# Patient Record
Sex: Male | Born: 1935
Health system: Southern US, Community
[De-identification: ages and names within clinical notes are randomized; demographics above are authoritative.]

## PROBLEM LIST (undated history)

## (undated) DIAGNOSIS — G20A1 Parkinson's disease without dyskinesia, without mention of fluctuations: Secondary | ICD-10-CM

## (undated) DIAGNOSIS — C4490 Unspecified malignant neoplasm of skin, unspecified: Secondary | ICD-10-CM

## (undated) DIAGNOSIS — M7989 Other specified soft tissue disorders: Secondary | ICD-10-CM

## (undated) DIAGNOSIS — G459 Transient cerebral ischemic attack, unspecified: Secondary | ICD-10-CM

## (undated) DIAGNOSIS — I714 Abdominal aortic aneurysm, without rupture, unspecified: Secondary | ICD-10-CM

## (undated) DIAGNOSIS — L570 Actinic keratosis: Secondary | ICD-10-CM

## (undated) DIAGNOSIS — I5030 Unspecified diastolic (congestive) heart failure: Secondary | ICD-10-CM

## (undated) DIAGNOSIS — I509 Heart failure, unspecified: Secondary | ICD-10-CM

## (undated) DIAGNOSIS — H3320 Serous retinal detachment, unspecified eye: Secondary | ICD-10-CM

## (undated) DIAGNOSIS — I2510 Atherosclerotic heart disease of native coronary artery without angina pectoris: Secondary | ICD-10-CM

## (undated) DIAGNOSIS — I739 Peripheral vascular disease, unspecified: Secondary | ICD-10-CM

## (undated) DIAGNOSIS — Z9289 Personal history of other medical treatment: Secondary | ICD-10-CM

## (undated) DIAGNOSIS — I350 Nonrheumatic aortic (valve) stenosis: Secondary | ICD-10-CM

## (undated) DIAGNOSIS — M810 Age-related osteoporosis without current pathological fracture: Secondary | ICD-10-CM

## (undated) DIAGNOSIS — K219 Gastro-esophageal reflux disease without esophagitis: Secondary | ICD-10-CM

## (undated) DIAGNOSIS — G20 Parkinson's disease: Secondary | ICD-10-CM

## (undated) DIAGNOSIS — M549 Dorsalgia, unspecified: Secondary | ICD-10-CM

## (undated) DIAGNOSIS — I10 Essential (primary) hypertension: Secondary | ICD-10-CM

## (undated) DIAGNOSIS — E119 Type 2 diabetes mellitus without complications: Secondary | ICD-10-CM

## (undated) DIAGNOSIS — I4819 Other persistent atrial fibrillation: Secondary | ICD-10-CM

## (undated) DIAGNOSIS — M889 Osteitis deformans of unspecified bone: Secondary | ICD-10-CM

## (undated) DIAGNOSIS — E782 Mixed hyperlipidemia: Secondary | ICD-10-CM

## (undated) HISTORY — DX: Abdominal aortic aneurysm, without rupture, unspecified: I71.40

## (undated) HISTORY — DX: Transient cerebral ischemic attack, unspecified: G45.9

## (undated) HISTORY — PX: TONSILLECTOMY: SUR1361

## (undated) HISTORY — PX: GALLBLADDER SURGERY: SHX652

## (undated) HISTORY — PX: COLONOSCOPY: SHX174

## (undated) HISTORY — DX: Dorsalgia, unspecified: M54.9

## (undated) HISTORY — PX: HERNIA REPAIR: SHX51

## (undated) HISTORY — DX: Peripheral vascular disease, unspecified: I73.9

## (undated) HISTORY — DX: Personal history of other medical treatment: Z92.89

## (undated) HISTORY — PX: APPENDECTOMY: SHX54

## (undated) HISTORY — DX: Heart failure, unspecified: I50.9

## (undated) HISTORY — PX: RETINAL DETACHMENT SURGERY: SHX105

## (undated) HISTORY — DX: Parkinson's disease: G20

## (undated) HISTORY — DX: Type 2 diabetes mellitus without complications: E11.9

## (undated) HISTORY — DX: Abdominal aortic aneurysm, without rupture: I71.4

## (undated) HISTORY — DX: Nonrheumatic aortic (valve) stenosis: I35.0

## (undated) HISTORY — PX: CATARACT EXTRACTION: SUR2

## (undated) HISTORY — DX: Unspecified diastolic (congestive) heart failure: I50.30

## (undated) HISTORY — DX: Osteitis deformans of unspecified bone: M88.9

## (undated) HISTORY — DX: Actinic keratosis: L57.0

## (undated) HISTORY — DX: Other specified soft tissue disorders: M79.89

## (undated) HISTORY — DX: Age-related osteoporosis without current pathological fracture: M81.0

## (undated) HISTORY — DX: Parkinson's disease without dyskinesia, without mention of fluctuations: G20.A1

## (undated) HISTORY — PX: ESOPHAGEAL DILATION: SHX303

## (undated) HISTORY — DX: Essential (primary) hypertension: I10

## (undated) HISTORY — DX: Mixed hyperlipidemia: E78.2

## (undated) HISTORY — DX: Serous retinal detachment, unspecified eye: H33.20

## (undated) HISTORY — DX: Unspecified malignant neoplasm of skin, unspecified: C44.90

## (undated) HISTORY — PX: OTHER SURGICAL HISTORY: SHX169

## (undated) HISTORY — DX: Gastro-esophageal reflux disease without esophagitis: K21.9

## (undated) HISTORY — PX: BACK SURGERY: SHX140

## (undated) HISTORY — DX: Other persistent atrial fibrillation: I48.19

---

## 2004-09-16 ENCOUNTER — Ambulatory Visit: Payer: Self-pay | Admitting: Unknown Physician Specialty

## 2005-04-21 ENCOUNTER — Inpatient Hospital Stay: Payer: Self-pay

## 2005-04-21 ENCOUNTER — Other Ambulatory Visit: Payer: Self-pay

## 2005-08-21 ENCOUNTER — Ambulatory Visit: Payer: Self-pay | Admitting: General Surgery

## 2005-08-28 ENCOUNTER — Ambulatory Visit: Payer: Self-pay | Admitting: General Surgery

## 2006-01-13 ENCOUNTER — Ambulatory Visit: Payer: Self-pay | Admitting: General Surgery

## 2006-07-15 ENCOUNTER — Ambulatory Visit: Payer: Self-pay | Admitting: General Surgery

## 2007-02-02 ENCOUNTER — Ambulatory Visit: Payer: Self-pay | Admitting: General Surgery

## 2007-04-19 ENCOUNTER — Other Ambulatory Visit: Payer: Self-pay

## 2007-04-19 ENCOUNTER — Emergency Department: Payer: Self-pay | Admitting: Emergency Medicine

## 2007-08-27 ENCOUNTER — Ambulatory Visit: Payer: Self-pay | Admitting: General Surgery

## 2008-03-14 ENCOUNTER — Ambulatory Visit: Payer: Self-pay | Admitting: General Surgery

## 2008-05-08 ENCOUNTER — Ambulatory Visit: Payer: Self-pay | Admitting: Unknown Physician Specialty

## 2008-09-12 ENCOUNTER — Ambulatory Visit: Payer: Self-pay | Admitting: General Surgery

## 2009-03-15 ENCOUNTER — Ambulatory Visit: Payer: Self-pay | Admitting: General Surgery

## 2009-09-12 ENCOUNTER — Ambulatory Visit: Payer: Self-pay | Admitting: General Surgery

## 2010-03-13 ENCOUNTER — Ambulatory Visit: Payer: Self-pay | Admitting: General Surgery

## 2010-07-05 ENCOUNTER — Ambulatory Visit: Payer: Self-pay

## 2010-09-05 ENCOUNTER — Ambulatory Visit: Payer: Self-pay | Admitting: General Surgery

## 2011-09-15 ENCOUNTER — Ambulatory Visit: Payer: Self-pay | Admitting: General Surgery

## 2011-09-23 ENCOUNTER — Encounter: Payer: Self-pay | Admitting: *Deleted

## 2011-09-24 ENCOUNTER — Ambulatory Visit (INDEPENDENT_AMBULATORY_CARE_PROVIDER_SITE_OTHER): Payer: Medicare Other | Admitting: Cardiovascular Disease

## 2011-09-24 ENCOUNTER — Encounter: Payer: Self-pay | Admitting: Cardiovascular Disease

## 2011-09-24 VITALS — BP 145/79 | HR 86 | Ht 69.0 in | Wt 205.0 lb

## 2011-09-24 DIAGNOSIS — E119 Type 2 diabetes mellitus without complications: Secondary | ICD-10-CM

## 2011-09-24 DIAGNOSIS — I2089 Other forms of angina pectoris: Secondary | ICD-10-CM | POA: Insufficient documentation

## 2011-09-24 DIAGNOSIS — R079 Chest pain, unspecified: Secondary | ICD-10-CM

## 2011-09-24 DIAGNOSIS — I10 Essential (primary) hypertension: Secondary | ICD-10-CM | POA: Insufficient documentation

## 2011-09-24 DIAGNOSIS — I208 Other forms of angina pectoris: Secondary | ICD-10-CM | POA: Insufficient documentation

## 2011-09-24 DIAGNOSIS — E1169 Type 2 diabetes mellitus with other specified complication: Secondary | ICD-10-CM | POA: Insufficient documentation

## 2011-09-24 DIAGNOSIS — E785 Hyperlipidemia, unspecified: Secondary | ICD-10-CM

## 2011-09-24 NOTE — Assessment & Plan Note (Signed)
Recent chest "indigestion" symptoms are atypical in nature. No symptoms with exertion. EKG is essentially unchanged. He denies any symptoms when he is exercising. No further workup at this time. We have asked him to call the office if he has worsening symptoms or shortness of breath or chest pain with exertion.

## 2011-09-24 NOTE — Assessment & Plan Note (Signed)
Blood pressure is well controlled on today's visit. No changes made to the medications. 

## 2011-09-24 NOTE — Assessment & Plan Note (Signed)
We have encouraged continued exercise, careful diet management in an effort to lose weight. 

## 2011-09-24 NOTE — Assessment & Plan Note (Signed)
Cholesterol is high. He is now tolerating WelChol well though he does report now cutting it in half. We will restart Crestor 5 mg every other day.

## 2011-09-24 NOTE — Patient Instructions (Signed)
You are doing well. Please take crestor 1/2 pill every other day with welchol  Please call us if you have new issues that need to be addressed before your next appt.  Your physician wants you to follow-up in: 12 months.  You will receive a reminder letter in the mail two months in advance. If you don't receive a letter, please call our office to schedule the follow-up appointment.

## 2011-09-24 NOTE — Progress Notes (Signed)
Patient ID: Timothy [name], male    DOB: [birth year], 78 y.o.   MRN: [id number]  HPI Comments: [name] is a very pleasant 78 year old gentleman with a history of high cholesterol, hypertension, diabetes hemoglobin A1c 6.6 with no known coronary artery disease, patient of Dr. Vonita Moss, previously seen by myself at [facility], who presents to reestablish care in the office.  He reports that he has had 3 or 4 episodes of "indigestion" with sensations in the left upper chest that come on when he is in bed lasting for 30 minutes up to one hour. He's otherwise been active and has no complaints. He reports that he was able to walk a long distance recently in a stadium upstairs and he did not have to stop for shortness of breath. He is able to go shopping, tends to his house on a regular basis with no symptoms.   Blood pressure at home is 120-140 systolic on a regular basis. He did have trouble in the past taking WelChol and now cuts the pill in half. A full pill made him choke. In the past he was on Crestor.  EKG shows normal sinus rhythm with sinus arrhythmia, rate 75 beats per minute, poor R wave correction to the anterior precordial leads, T wave abnormality in V6  Labwork from March 2013 shows total cholesterol 207, LDL 125   Outpatient Encounter Prescriptions as of 09/24/2011  Medication Sig Dispense Refill  . aspirin 81 MG tablet Take 81 mg by mouth daily.      . benazepril (LOTENSIN) 20 MG tablet Take 20 mg by mouth daily.      . Colesevelam HCl (WELCHOL PO) Take 6 tablets once daily by mouth      . glucosamine-chondroitin 500-400 MG tablet Take 1 tablet by mouth daily.      Marland Kitchen glucose blood test strip 1 each by Other route 2 (two) times daily. Use as instructed      . glyBURIDE-metformin (GLUCOVANCE) 5-500 MG per tablet Take 1 tablet by mouth 2 (two) times daily with a meal.      . metoprolol succinate (TOPROL-XL) 50 MG 24 hr tablet Take 50 mg by mouth daily.  Take with or immediately following a meal.      . pantoprazole (PROTONIX) 40 MG tablet Take 40 mg by mouth daily.      Marland Kitchen PHENobarbital (LUMINAL) 32.4 MG tablet Take 32.4 mg by mouth at bedtime.      . pioglitazone (ACTOS) 15 MG tablet Take 15 mg by mouth daily.      Marland Kitchen triamcinolone cream (KENALOG) 0.1 % As directed       Review of Systems  Constitutional: Negative.   HENT: Negative.   Eyes: Negative.   Respiratory: Negative.   Cardiovascular: Negative.        "Indigestion" in the chest a left  Gastrointestinal: Negative.   Musculoskeletal: Negative.   Skin: Negative.   Neurological: Negative.   Hematological: Negative.   Psychiatric/Behavioral: Negative.   All other systems reviewed and are negative.    BP 145/79  Pulse 86  Ht 5\' 9"  (1.753 m)  Wt 205 lb (92.987 kg)  BMI 30.27 kg/m2  Physical Exam  Nursing note and vitals reviewed. Constitutional: He is oriented to person, place, and time. He appears well-developed and well-nourished.  HENT:  Head: Normocephalic.  Nose: Nose normal.  Mouth/Throat: Oropharynx is clear and moist.  Eyes: Conjunctivae are normal. Pupils are equal, round, and reactive to  light.  Neck: Normal range of motion. Neck supple. No JVD present.  Cardiovascular: Normal rate, regular rhythm, S1 normal, S2 normal and intact distal pulses.  Exam reveals no gallop and no friction rub.   Murmur heard.  Crescendo systolic murmur is present with a grade of 2/6  Pulmonary/Chest: Effort normal and breath sounds normal. No respiratory distress. He has no wheezes. He has no rales. He exhibits no tenderness.  Abdominal: Soft. Bowel sounds are normal. He exhibits no distension. There is no tenderness.  Musculoskeletal: Normal range of motion. He exhibits no edema and no tenderness.  Lymphadenopathy:    He has no cervical adenopathy.  Neurological: He is alert and oriented to person, place, and time. Coordination normal.  Skin: Skin is warm and dry. No rash noted.  No erythema.  Psychiatric: He has a normal mood and affect. His behavior is normal. Judgment and thought content normal.           Assessment and Plan

## 2011-11-18 ENCOUNTER — Telehealth: Payer: Self-pay

## 2011-11-18 MED ORDER — FUROSEMIDE 20 MG PO TABS: 20.0000 mg | ORAL_TABLET | Freq: Every day | ORAL | Status: DC

## 2011-11-18 NOTE — Telephone Encounter (Signed)
Per Dr. Mariah Milling call in furosemide 20 mg take one tablet daily for the patient.

## 2011-11-19 ENCOUNTER — Encounter: Payer: Self-pay | Admitting: Cardiovascular Disease

## 2011-12-24 ENCOUNTER — Observation Stay: Payer: Self-pay | Admitting: Internal Medicine

## 2011-12-24 LAB — APTT: Activated PTT: 29.7 secs (ref 23.6–35.9)

## 2011-12-24 LAB — CBC
HCT: 35.6 % — ABNORMAL LOW (ref 40.0–52.0)
HGB: 11.6 g/dL — ABNORMAL LOW (ref 13.0–18.0)
MCH: 31.6 pg (ref 26.0–34.0)
MCHC: 32.6 g/dL (ref 32.0–36.0)
MCV: 97 fL (ref 80–100)
Platelet: 192 10*3/uL (ref 150–440)
RBC: 3.67 10*6/uL — ABNORMAL LOW (ref 4.40–5.90)
RDW: 14 % (ref 11.5–14.5)
WBC: 6.4 10*3/uL (ref 3.8–10.6)

## 2011-12-24 LAB — PROTIME-INR
INR: 0.9
Prothrombin Time: 13 secs (ref 11.5–14.7)

## 2011-12-24 LAB — COMPREHENSIVE METABOLIC PANEL
Albumin: 4 g/dL (ref 3.4–5.0)
Alkaline Phosphatase: 57 U/L (ref 50–136)
Anion Gap: 12 (ref 7–16)
BUN: 22 mg/dL — ABNORMAL HIGH (ref 7–18)
Bilirubin,Total: 0.3 mg/dL (ref 0.2–1.0)
Calcium, Total: 8.3 mg/dL — ABNORMAL LOW (ref 8.5–10.1)
Chloride: 102 mmol/L (ref 98–107)
Co2: 25 mmol/L (ref 21–32)
Creatinine: 1.3 mg/dL (ref 0.60–1.30)
EGFR (African American): 60 — ABNORMAL LOW
EGFR (Non-African Amer.): 52 — ABNORMAL LOW
Glucose: 160 mg/dL — ABNORMAL HIGH (ref 65–99)
Osmolality: 284 (ref 275–301)
Potassium: 3.9 mmol/L (ref 3.5–5.1)
SGOT(AST): 32 U/L (ref 15–37)
SGPT (ALT): 26 U/L
Sodium: 139 mmol/L (ref 136–145)
Total Protein: 6.8 g/dL (ref 6.4–8.2)

## 2011-12-24 LAB — CK TOTAL AND CKMB (NOT AT ARMC)
CK, Total: 96 U/L (ref 35–232)
CK-MB: 1.1 ng/mL (ref 0.5–3.6)

## 2011-12-24 LAB — TROPONIN I: Troponin-I: <0.02 ng/mL

## 2011-12-25 DIAGNOSIS — R011 Cardiac murmur, unspecified: Secondary | ICD-10-CM

## 2011-12-25 DIAGNOSIS — G459 Transient cerebral ischemic attack, unspecified: Secondary | ICD-10-CM

## 2011-12-25 DIAGNOSIS — I359 Nonrheumatic aortic valve disorder, unspecified: Secondary | ICD-10-CM

## 2011-12-25 LAB — LIPID PANEL
Cholesterol: 109 mg/dL (ref 0–200)
HDL Cholesterol: 29 mg/dL — ABNORMAL LOW (ref 40–60)
Ldl Cholesterol, Calc: 41 mg/dL (ref 0–100)
Triglycerides: 195 mg/dL (ref 0–200)
VLDL Cholesterol, Calc: 39 mg/dL (ref 5–40)

## 2011-12-26 ENCOUNTER — Telehealth: Payer: Self-pay | Admitting: Cardiovascular Disease

## 2011-12-26 ENCOUNTER — Ambulatory Visit: Payer: Medicare Other | Admitting: Cardiovascular Disease

## 2011-12-26 NOTE — Telephone Encounter (Signed)
Pt daughter has some questions concerning the echo that was done yesterday VS the stress test

## 2011-12-29 ENCOUNTER — Telehealth: Payer: Self-pay

## 2011-12-29 ENCOUNTER — Encounter: Payer: Self-pay | Admitting: Cardiovascular Disease

## 2011-12-29 ENCOUNTER — Ambulatory Visit (INDEPENDENT_AMBULATORY_CARE_PROVIDER_SITE_OTHER): Payer: Medicare Other | Admitting: Cardiovascular Disease

## 2011-12-29 VITALS — BP 108/60 | HR 73 | Ht 69.0 in | Wt 200.0 lb

## 2011-12-29 DIAGNOSIS — G459 Transient cerebral ischemic attack, unspecified: Secondary | ICD-10-CM | POA: Insufficient documentation

## 2011-12-29 DIAGNOSIS — E785 Hyperlipidemia, unspecified: Secondary | ICD-10-CM

## 2011-12-29 DIAGNOSIS — R079 Chest pain, unspecified: Secondary | ICD-10-CM

## 2011-12-29 DIAGNOSIS — I208 Other forms of angina pectoris: Secondary | ICD-10-CM

## 2011-12-29 DIAGNOSIS — R0602 Shortness of breath: Secondary | ICD-10-CM

## 2011-12-29 DIAGNOSIS — E119 Type 2 diabetes mellitus without complications: Secondary | ICD-10-CM

## 2011-12-29 DIAGNOSIS — I10 Essential (primary) hypertension: Secondary | ICD-10-CM

## 2011-12-29 DIAGNOSIS — I209 Angina pectoris, unspecified: Secondary | ICD-10-CM

## 2011-12-29 NOTE — Assessment & Plan Note (Addendum)
Blood pressure is low today and we have suggested he cut his benazepril  in half.

## 2011-12-29 NOTE — Telephone Encounter (Signed)
Pts dtr called, wanted to let us know pt has hx of AAA and wants to make sure this is in his chart. Scheduled for lexi 7/1.  I reassured her this IS part of his hx in chart and Dr. Mariah Milling is aware. Understanding verb.

## 2011-12-29 NOTE — Assessment & Plan Note (Signed)
We have suggested he continue on Crestor.

## 2011-12-29 NOTE — Patient Instructions (Addendum)
You are doing well. Try lasix either every other day or as needed for edema  cut the lotensin (benazepril) in 1/2   We will schedule you for a stress test at [facility]  Ask Dr. Dossie Arbour about other labs: B12, vit D, testosterone  Please call us if you have new issues that need to be addressed before your next appt.  Your physician wants you to follow-up in: 6 months.  You will receive a reminder letter in the mail two months in advance. If you don't receive a letter, please call our office to schedule the follow-up appointment.

## 2011-12-29 NOTE — Assessment & Plan Note (Signed)
Per his family, he continues to have vague chest symptoms, also his shortness of breath. They're concerned about cardiac issues. We will set him up for a chemical Myoview. He is unable to treadmill secondary to hip pain.

## 2011-12-29 NOTE — Telephone Encounter (Signed)
dtr says pt was admitted recently to [facility] w/c/o arm numbness and generalized malaise.  He was r/o for MI. Echo was normal.She asks what the difference is between lexi myoview and echo. The diff were explained. Pt had echo in hosp but has never had stress test.She says he continues to feel "somethings not right".  He has follow up from hos[p scheduled for 6/28.  She wonders if there is anything sooner. I gave her appt for this am at 1045.  She will make sure this is ok with pt and call me back.

## 2011-12-29 NOTE — Assessment & Plan Note (Signed)
Recent symptoms early June 2013 concerning for TIA. We'll continue high-dose aspirin. Etiology could be secondary to mild plaquing in the carotid or aorta. No arrhythmias seen while in the hospital.

## 2011-12-29 NOTE — Assessment & Plan Note (Signed)
We have encouraged continued exercise, careful diet management in an effort to lose weight. 

## 2011-12-29 NOTE — Progress Notes (Signed)
Patient ID: Timothy [name], male    DOB: 02/08/1929, 78 y.o.   MRN: [id number]  HPI Comments: [name] is a very pleasant 78 year old gentleman with a history of high cholesterol, hypertension, diabetes hemoglobin A1c 6.6 with no known coronary artery disease, patient of Dr. Vonita Moss, who presents after recent admission in the hospital.  He presents with his 2 daughters today to the clinic. He was recently admitted to the hospital for TIA and murmur. He was sitting in his car, when he got out, he developed left arm numbness and weakness. He was unable to move his left arm for 30 minutes. Symptoms resolved in the emergency room. Workup was essentially negative including CT scan, MRI, carotid ultrasound and echocardiogram. He did have mild aortic valve stenosis. He was discharged on aspirin  Labwork in the hospital showed total cholesterol 109, LDL 41, HDL 29  Echocardiogram showed normal LV function with systolic ejection fraction greater than 55%, evidence of diastolic relaxation abnormality, normal right ventricular systolic pressures, mild aortic valve regurgitation, borderline to mild aortic valve stenosis  Mild plaquing noted in the carotid bilaterally  EKG shows normal sinus rhythm with sinus arrhythmia, rate 73 beats per minute, poor R wave correction to the anterior precordial leads, T wave abnormality in V6, lead 3, aVF    Outpatient Encounter Prescriptions as of 12/29/2011  Medication Sig Dispense Refill  . aspirin 325 MG tablet Take 325 mg by mouth daily.      . benazepril (LOTENSIN) 20 MG tablet Take 20 mg by mouth daily.      . Colesevelam HCl (WELCHOL PO) Take 6 tablets once daily by mouth      . furosemide (LASIX) 20 MG tablet Take 1 tablet (20 mg total) by mouth daily. Take as directed.  90 tablet  3  . glucosamine-chondroitin 500-400 MG tablet Take 1 tablet by mouth daily.      Marland Kitchen glucose blood test strip 1 each by Other route 2 (two) times daily. Use as instructed      .  glyBURIDE-metformin (GLUCOVANCE) 5-500 MG per tablet Take 1 tablet by mouth 2 (two) times daily with a meal.      . metoprolol succinate (TOPROL-XL) 50 MG 24 hr tablet Take 50 mg by mouth daily. Take with or immediately following a meal.      . pantoprazole (PROTONIX) 40 MG tablet Take 40 mg by mouth daily.      Marland Kitchen PHENobarbital (LUMINAL) 32.4 MG tablet Take 32.4 mg by mouth daily.      . pioglitazone (ACTOS) 15 MG tablet Take 15 mg by mouth daily.      . rosuvastatin (CRESTOR) 10 MG tablet Take 10 mg by mouth every other day.   30 tablet  11  . triamcinolone cream (KENALOG) 0.1 % As directed       Review of Systems  Constitutional: Negative.   HENT: Negative.   Eyes: Negative.   Respiratory: Negative.   Cardiovascular: Negative.   Gastrointestinal: Negative.   Musculoskeletal: Negative.   Skin: Negative.   Neurological: Negative.        Arm numbness that resolved after 30 minutes  Hematological: Negative.   Psychiatric/Behavioral: Negative.   All other systems reviewed and are negative.    BP 108/60  Pulse 73  Ht 5\' 9"  (1.753 m)  Wt 200 lb (90.719 kg)  BMI 29.53 kg/m2  Physical Exam  Nursing note and vitals reviewed. Constitutional: He is oriented to person, place, and time. He appears  well-developed and well-nourished.  HENT:  Head: Normocephalic.  Nose: Nose normal.  Mouth/Throat: Oropharynx is clear and moist.  Eyes: Conjunctivae are normal. Pupils are equal, round, and reactive to light.  Neck: Normal range of motion. Neck supple. No JVD present.  Cardiovascular: Normal rate, regular rhythm, S1 normal, S2 normal and intact distal pulses.  Exam reveals no gallop and no friction rub.   Murmur heard.  Crescendo systolic murmur is present with a grade of 2/6  Pulmonary/Chest: Effort normal and breath sounds normal. No respiratory distress. He has no wheezes. He has no rales. He exhibits no tenderness.  Abdominal: Soft. Bowel sounds are normal. He exhibits no distension.  There is no tenderness.  Musculoskeletal: Normal range of motion. He exhibits no edema and no tenderness.  Lymphadenopathy:    He has no cervical adenopathy.  Neurological: He is alert and oriented to person, place, and time. Coordination normal.  Skin: Skin is warm and dry. No rash noted. No erythema.  Psychiatric: He has a normal mood and affect. His behavior is normal. Judgment and thought content normal.           Assessment and Plan

## 2011-12-30 ENCOUNTER — Encounter: Payer: Self-pay | Admitting: Cardiovascular Disease

## 2011-12-30 NOTE — Addendum Note (Signed)
Addended by: Festus Aloe on: 12/30/2011 03:51 PM   Modules accepted: Orders

## 2012-01-02 ENCOUNTER — Ambulatory Visit: Payer: Medicare Other | Admitting: Cardiovascular Disease

## 2012-01-05 ENCOUNTER — Ambulatory Visit: Payer: Self-pay | Admitting: Cardiovascular Disease

## 2012-01-05 DIAGNOSIS — R079 Chest pain, unspecified: Secondary | ICD-10-CM

## 2012-01-06 ENCOUNTER — Telehealth: Payer: Self-pay | Admitting: Physician Assistant

## 2012-01-06 ENCOUNTER — Telehealth: Payer: Self-pay | Admitting: Cardiovascular Disease

## 2012-01-06 NOTE — Telephone Encounter (Signed)
Preliminary results given to pt.  I explained Dr. Mariah Milling still needs to read test, but prelim results show "low risk scan". I will call pt back if Dr. Mariah Milling makes any changes.  Understanding verb.

## 2012-01-06 NOTE — Telephone Encounter (Signed)
Pt and daughter calling for stress test results that were done yesterday. Pt daughter states that they dont want to have to wait for results and if we could speed the process up of getting them. She is concerned bc pt stated to her that he had some pains in this throat while having the test and dont think he told them about it. Pt daughter stated that Dr Mariah Milling said he would have results last night and dont understand why he didn't call with the results.

## 2012-01-06 NOTE — Telephone Encounter (Signed)
Patient's daughter called this evening concerned about her father's SOB. The patient feels very fatigued and has felt as though he has a lump in his throat. He was told by ENT this may be heart related so they saw Dr. Mariah Milling 12/29/11 with increased "lump" sensation during stress test. See phone note from today - ?preliminary read is negative, but the patient's daughter is worried that things are progressing. The patient became very SOB while picking flowers today which is not normal for him. She was wondering if she should take him to the ER and I told her this is very reasonable given her level of concern. She verbalized understanding and gratitude. Desire Fulp PA-C

## 2012-01-12 ENCOUNTER — Other Ambulatory Visit: Payer: Self-pay | Admitting: Cardiovascular Disease

## 2012-01-12 DIAGNOSIS — R079 Chest pain, unspecified: Secondary | ICD-10-CM

## 2012-01-12 DIAGNOSIS — R0602 Shortness of breath: Secondary | ICD-10-CM

## 2012-01-13 ENCOUNTER — Telehealth: Payer: Self-pay

## 2012-01-13 NOTE — Telephone Encounter (Signed)
I rec'd t/c from pt's dtr, Glea.  She asks if stress test was normal.  I explained these results had already been given to pt and yes stress test was negative for ischemia. She voices concern over some symptoms pt was having during test.  She says he was c/o throat tightness during test and this concerned dtr. I tried reassuring her, based on normal results, it seems these symptoms did not correlate with cardiac cause.   She verb. Understanding but still feels there is "something wrong". She says pt can walk outside for only a few minutes and he becomes extremely weak/fatigued.  She says he has appt today with PCP to f/u as well.   She wanted to make Dr. Mariah Milling aware of pt still feeling poorly in the presence of a normal stress test. His SBP is still around 100 mm hg. He decreased benazapril as directed by Dr. Mariah Milling to 1/2 tablet daily. I reassured her I would make Dr. Mariah Milling aware and call her/pt back.  Understanding verb.

## 2012-01-14 NOTE — Telephone Encounter (Signed)
Would hold benazepril and let  blood pressure come up more Perhaps this is contributing to his symptoms Would have primary care physician do a full lab panel including thyroid, testosterone

## 2012-01-14 NOTE — Telephone Encounter (Signed)
Pt informed.  He says he went to [facility] yesterday and was told his B12 was "very low" and was started on B12 injections. I explained this may be why he has been feeling poorly but also advised him to try holding benazepril to see if this helps raise BP. He will try this.

## 2012-02-10 ENCOUNTER — Ambulatory Visit: Payer: Self-pay | Admitting: Unknown Physician Specialty

## 2012-02-20 ENCOUNTER — Ambulatory Visit: Payer: Self-pay | Admitting: Unknown Physician Specialty

## 2012-02-23 LAB — PATHOLOGY REPORT

## 2012-03-03 ENCOUNTER — Ambulatory Visit: Payer: Self-pay | Admitting: Unknown Physician Specialty

## 2012-06-15 ENCOUNTER — Ambulatory Visit: Payer: Self-pay | Admitting: Unknown Physician Specialty

## 2012-06-28 ENCOUNTER — Ambulatory Visit (INDEPENDENT_AMBULATORY_CARE_PROVIDER_SITE_OTHER): Payer: Medicare Other | Admitting: Cardiovascular Disease

## 2012-06-28 ENCOUNTER — Encounter: Payer: Self-pay | Admitting: Cardiovascular Disease

## 2012-06-28 VITALS — BP 118/68 | HR 67 | Ht 69.5 in | Wt 196.5 lb

## 2012-06-28 DIAGNOSIS — I10 Essential (primary) hypertension: Secondary | ICD-10-CM

## 2012-06-28 DIAGNOSIS — R5383 Other fatigue: Secondary | ICD-10-CM

## 2012-06-28 DIAGNOSIS — E119 Type 2 diabetes mellitus without complications: Secondary | ICD-10-CM

## 2012-06-28 DIAGNOSIS — R059 Cough, unspecified: Secondary | ICD-10-CM

## 2012-06-28 DIAGNOSIS — E785 Hyperlipidemia, unspecified: Secondary | ICD-10-CM

## 2012-06-28 DIAGNOSIS — R0602 Shortness of breath: Secondary | ICD-10-CM

## 2012-06-28 DIAGNOSIS — R5381 Other malaise: Secondary | ICD-10-CM

## 2012-06-28 DIAGNOSIS — R05 Cough: Secondary | ICD-10-CM

## 2012-06-28 NOTE — Progress Notes (Signed)
Patient ID: Timothy [name], male    DOB: 1928-10-20, 78 y.o.   MRN: [id number]  HPI Comments: Timothy [name] is a very pleasant 78 year old gentleman with a history of high cholesterol, coronary plaquing seen on chest x-ray , hypertension, diabetes hemoglobin A1c 6.6 with no known coronary artery disease, patient of Dr. Vonita Moss, previous episode of left arm numbness and weakness. He was unable to move his left arm for 30 minutes. Symptoms resolved in the emergency room. Workup was essentially negative including CT scan, MRI, carotid ultrasound and echocardiogram. He did have mild aortic valve stenosis. He was discharged on aspirin.  Labwork in the hospital showed total cholesterol 109, LDL 41, HDL 29 Hemoglobin W0J 6.6 Lab work by Dr. Dossie Arbour shows total cholesterol is 207, LDL 125, March 2013  His biggest complaint is he has a cough for the past 4 weeks. He has seen [facility] and received antibiotics. He has had followup with ear nose throat and was told to stop his ACE inhibitor. No improvement by holding benazepril 20 mg daily. He is on proton pump inhibitor for history of GERD. His daughter is concerned this could be his heart.  He has chronic fatigue, no significant edema.  Recent echocardiogram and stress test within the past 6 months were essentially normal  Echocardiogram showed normal LV function with systolic ejection fraction greater than 55%, evidence of diastolic relaxation abnormality, normal right ventricular systolic pressures, mild aortic valve regurgitation, borderline to mild aortic valve stenosis  Mild plaquing noted in the carotid bilaterally  3.7 cm abdominal aortic aneurysm March 2013  EKG shows normal sinus rhythm with sinus arrhythmia, rate 67 beats per minute, poor R wave progression through the anterior precordial leads    Outpatient Encounter Prescriptions as of 06/28/2012  Medication Sig Dispense Refill  . Colesevelam HCl (WELCHOL PO) Take 6 tablets once  daily by mouth      . furosemide (LASIX) 20 MG tablet Take 1 tablet (20 mg total) by mouth daily. Take as directed.  90 tablet  3  . glucosamine-chondroitin 500-400 MG tablet Take 1 tablet by mouth daily.      Marland Kitchen glucose blood test strip 1 each by Other route 2 (two) times daily. Use as instructed      . glyBURIDE-metformin (GLUCOVANCE) 5-500 MG per tablet Take 1 tablet by mouth 2 (two) times daily with a meal.      . metoprolol succinate (TOPROL-XL) 50 MG 24 hr tablet Take 50 mg by mouth daily. Take with or immediately following a meal.      . pantoprazole (PROTONIX) 40 MG tablet Take 40 mg by mouth 2 (two) times daily.       Marland Kitchen PHENobarbital (LUMINAL) 32.4 MG tablet Take 32.4 mg by mouth daily.      . pioglitazone (ACTOS) 15 MG tablet Take 15 mg by mouth daily.      . rosuvastatin (CRESTOR) 10 MG tablet Take 10 mg by mouth every other day.   30 tablet  11  . triamcinolone cream (KENALOG) 0.1 % As directed      . aspirin 325 MG tablet Take 325 mg by mouth daily.      . [DISCONTINUED] benazepril (LOTENSIN) 20 MG tablet Take 20 mg by mouth daily.        Review of Systems  Constitutional: Positive for fatigue.  HENT: Negative.   Eyes: Negative.   Respiratory: Positive for cough.   Cardiovascular: Negative.   Gastrointestinal: Negative.   Musculoskeletal: Negative.  Skin: Negative.   Neurological: Negative.   Hematological: Negative.   Psychiatric/Behavioral: Negative.   All other systems reviewed and are negative.    BP 118/68  Pulse 67  Ht 5' 9.5" (1.765 m)  Wt 196 lb 8 oz (89.132 kg)  BMI 28.60 kg/m2  Physical Exam  Nursing note and vitals reviewed. Constitutional: He is oriented to person, place, and time. He appears well-developed and well-nourished.  HENT:  Head: Normocephalic.  Nose: Nose normal.  Mouth/Throat: Oropharynx is clear and moist.  Eyes: Conjunctivae normal are normal. Pupils are equal, round, and reactive to light.  Neck: Normal range of motion. Neck  supple. No JVD present.  Cardiovascular: Normal rate, regular rhythm, S1 normal, S2 normal and intact distal pulses.  Exam reveals no gallop and no friction rub.   Murmur heard.  Crescendo systolic murmur is present with a grade of 2/6  Pulmonary/Chest: Effort normal and breath sounds normal. No respiratory distress. He has no wheezes. He has no rales. He exhibits no tenderness.  Abdominal: Soft. Bowel sounds are normal. He exhibits no distension. There is no tenderness.  Musculoskeletal: Normal range of motion. He exhibits no edema and no tenderness.  Lymphadenopathy:    He has no cervical adenopathy.  Neurological: He is alert and oriented to person, place, and time. Coordination normal.  Skin: Skin is warm and dry. No rash noted. No erythema.  Psychiatric: He has a normal mood and affect. His behavior is normal. Judgment and thought content normal.           Assessment and Plan

## 2012-06-28 NOTE — Assessment & Plan Note (Signed)
I suspect his fatigue is not cardiac. We have recommended to his daughter and to the patient that he start a regular exercise program.

## 2012-06-28 NOTE — Assessment & Plan Note (Signed)
We have encouraged continued exercise, careful diet management in an effort to lose weight. 

## 2012-06-28 NOTE — Assessment & Plan Note (Signed)
We have suggested that he stay on his cholesterol medication.

## 2012-06-28 NOTE — Assessment & Plan Note (Signed)
Blood pressure is well controlled on today's visit. No changes made to the medications. 

## 2012-06-28 NOTE — Patient Instructions (Addendum)
You are doing well. No medication changes were made.  Please call us if you have new issues that need to be addressed before your next appt.  Your physician wants you to follow-up in: 6 months.  You will receive a reminder letter in the mail two months in advance. If you don't receive a letter, please call our office to schedule the follow-up appointment.   

## 2012-06-28 NOTE — Assessment & Plan Note (Signed)
Etiology of his cough is uncertain. He is no longer on ACE inhibitor, he is on PPI with no improvement of his symptoms. No signs of heart failure. He does have long exposure to carpet and dust. Recent chest x-ray and CT scan was unrevealing.

## 2012-08-13 ENCOUNTER — Encounter: Payer: Self-pay | Admitting: *Deleted

## 2012-09-08 ENCOUNTER — Ambulatory Visit: Payer: Self-pay | Admitting: General Surgery

## 2012-09-15 ENCOUNTER — Encounter: Payer: Self-pay | Admitting: *Deleted

## 2012-09-21 ENCOUNTER — Ambulatory Visit: Payer: Self-pay | Admitting: General Surgery

## 2012-10-07 ENCOUNTER — Ambulatory Visit (INDEPENDENT_AMBULATORY_CARE_PROVIDER_SITE_OTHER): Payer: Medicare Other | Admitting: General Surgery

## 2012-10-07 ENCOUNTER — Encounter: Payer: Self-pay | Admitting: General Surgery

## 2012-10-07 VITALS — BP 118/60 | HR 70 | Resp 16 | Ht 70.0 in | Wt 195.0 lb

## 2012-10-07 DIAGNOSIS — I724 Aneurysm of artery of lower extremity: Secondary | ICD-10-CM

## 2012-10-07 DIAGNOSIS — I714 Abdominal aortic aneurysm, without rupture, unspecified: Secondary | ICD-10-CM | POA: Insufficient documentation

## 2012-10-07 NOTE — Patient Instructions (Signed)
Patient to have is legs checked by a vascular doctor.

## 2012-10-07 NOTE — Progress Notes (Signed)
Patient ID: Timothy [name], male   DOB: 10/22/1928, 79 y.o.   MRN: [id number]  Chief Complaint  Patient presents with  . Other    AAA U/s     HPI This is a 79 year old male following up for an AAA .   Past Medical History  Diagnosis Date  . Diabetes mellitus   . Hypertension   . Hyperlipidemia   . Back pain   . Leg pain   . Esophageal reflux   . Twitching   . 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 Senile osteoporosis   . Skin cancer   . TIA (transient ischemic attack)   . Abdominal aneurysm, ruptured   . CHF (congestive heart failure)     Past Surgical History  Procedure Laterality Date  . Gallbladder surgery    . Appendectomy    . Tonsillectomy    . Hernia repair    . Esophageal dilation    . Colonoscopy  4098,[id number]  . Cataract extraction    . Retinal detachment surgery      Family History  Problem Relation Age of Onset  . Diabetes      grandparents  . Heart disease Mother   . Heart disease Brother   . Stroke Brother     Social History History  Substance Use Topics  . Smoking status: Never Smoker   . Smokeless tobacco: Never Used  . Alcohol Use: No    No Known Allergies  Current Outpatient Prescriptions  Medication Sig Dispense Refill  . aspirin 325 MG tablet Take 81 mg by mouth daily.       . benazepril (LOTENSIN) 20 MG tablet Take 20 mg by mouth daily.      . Colesevelam HCl (WELCHOL PO) Take 6 tablets once daily by mouth      . furosemide (LASIX) 20 MG tablet Take 1 tablet (20 mg total) by mouth daily. Take as directed.  90 tablet  3  . glucosamine-chondroitin 500-400 MG tablet Take 1 tablet by mouth daily.      Marland Kitchen glucose blood test strip 1 each by Other route 2 (two) times daily. Use as instructed      . glyBURIDE-metformin (GLUCOVANCE) 5-500 MG per tablet Take 1 tablet by mouth 2 (two) times daily with a meal.      . metoprolol succinate (TOPROL-XL) 50 MG 24 hr tablet Take 50 mg by mouth daily. Take with or  immediately following a meal.      . Multiple Vitamins-Minerals (MULTIVITAMIN PO) Take 1 tablet by mouth daily.      . pantoprazole (PROTONIX) 40 MG tablet Take 40 mg by mouth 2 (two) times daily.       Marland Kitchen PHENobarbital (LUMINAL) 32.4 MG tablet Take 32.4 mg by mouth daily.      . pioglitazone (ACTOS) 15 MG tablet Take 15 mg by mouth daily.      . rosuvastatin (CRESTOR) 10 MG tablet Take 10 mg by mouth every other day.   30 tablet  11  . triamcinolone cream (KENALOG) 0.1 % As directed       No current facility-administered medications for this visit.    Review of Systems Review of Systems  Constitutional: Negative.   Respiratory: Negative.   Cardiovascular: Negative.   Gastrointestinal: Negative.     Blood pressure 118/60, pulse 70, resp. rate 16, height 5\' 10"  (1.778 m), weight 195 lb (88.451 kg).  Physical Exam Physical Exam  Constitutional: He appears well-developed and well-nourished.  Neck: Normal range of motion. Neck supple. No mass and no thyromegaly present.  Cardiovascular: Normal rate, regular rhythm, normal heart sounds and intact distal pulses.   Pulmonary/Chest: Effort normal and breath sounds normal.  Abdominal: Soft. Normal appearance and bowel sounds are normal.    Data Reviewed Ultrasound examination of the popliteal areas bilaterally completed September 08, 2012 showed the right popliteal artery measured 1.6 x 1.86 cm. This previously measured 1.18 x 1.28 cm in March 2013.  The left popliteal vessel measures 1.8 x 1.87. Previously this measured 1.48 x 1.63. Partially calcified mural thrombus is noted in the left popliteal artery. Noncalcified thrombus noted in the right popliteal artery.  Abdominal aorta ultrasound completed September 08, 2012 shows an infrarenal aneurysm measuring 3.52 x 3.54 cm. This is stable compared to previous exams.     Assessment    Modest enlargement of the popliteal aneurysms bilaterally. Stable infrarenal aneurysm.    Plan    The  patient's care had been informally discussed with the vascular surgery service. At this point he may be a candidate for intervention. The patient and his daughter were encouraged to have a formal vascular consultation either locally or at [facility] as it is their preference.       Earline Mayotte 10/08/2012, 3:47 PM

## 2012-10-12 ENCOUNTER — Encounter: Payer: Self-pay | Admitting: General Surgery

## 2013-01-03 ENCOUNTER — Ambulatory Visit: Payer: Medicare Other | Admitting: Cardiovascular Disease

## 2013-01-11 ENCOUNTER — Ambulatory Visit: Payer: Medicare Other | Admitting: Cardiovascular Disease

## 2013-01-17 ENCOUNTER — Encounter: Payer: Self-pay | Admitting: *Deleted

## 2013-02-09 ENCOUNTER — Other Ambulatory Visit: Payer: Self-pay

## 2013-03-03 ENCOUNTER — Ambulatory Visit: Payer: Self-pay | Admitting: Unknown Physician Specialty

## 2013-03-14 ENCOUNTER — Other Ambulatory Visit: Payer: Self-pay | Admitting: *Deleted

## 2013-03-14 MED ORDER — FUROSEMIDE 20 MG PO TABS: 20.0000 mg | ORAL_TABLET | Freq: Every day | ORAL | Status: DC

## 2013-03-14 NOTE — Telephone Encounter (Signed)
Refilled Furosemide sent to [facility]. LMTCB pt needs to schedule future appointment with Dr. Mariah Milling 6 month f/u overdue.

## 2013-05-12 ENCOUNTER — Other Ambulatory Visit: Payer: Self-pay

## 2013-07-26 ENCOUNTER — Ambulatory Visit: Payer: Self-pay | Admitting: Family Medicine

## 2013-11-16 ENCOUNTER — Ambulatory Visit (INDEPENDENT_AMBULATORY_CARE_PROVIDER_SITE_OTHER): Payer: Medicare Other | Admitting: Cardiovascular Disease

## 2013-11-16 ENCOUNTER — Encounter: Payer: Self-pay | Admitting: Cardiovascular Disease

## 2013-11-16 VITALS — BP 105/64 | HR 70 | Ht 69.5 in | Wt 197.0 lb

## 2013-11-16 DIAGNOSIS — I739 Peripheral vascular disease, unspecified: Secondary | ICD-10-CM

## 2013-11-16 DIAGNOSIS — I10 Essential (primary) hypertension: Secondary | ICD-10-CM

## 2013-11-16 DIAGNOSIS — R5381 Other malaise: Secondary | ICD-10-CM

## 2013-11-16 DIAGNOSIS — I714 Abdominal aortic aneurysm, without rupture, unspecified: Secondary | ICD-10-CM

## 2013-11-16 DIAGNOSIS — E785 Hyperlipidemia, unspecified: Secondary | ICD-10-CM

## 2013-11-16 DIAGNOSIS — R5383 Other fatigue: Secondary | ICD-10-CM

## 2013-11-16 DIAGNOSIS — E119 Type 2 diabetes mellitus without complications: Secondary | ICD-10-CM

## 2013-11-16 DIAGNOSIS — I2510 Atherosclerotic heart disease of native coronary artery without angina pectoris: Secondary | ICD-10-CM

## 2013-11-16 MED ORDER — BENAZEPRIL HCL 10 MG PO TABS: 10.0000 mg | ORAL_TABLET | Freq: Every day | ORAL | Status: DC

## 2013-11-16 NOTE — Assessment & Plan Note (Signed)
Cholesterol is at goal on the current lipid regimen. No changes to the medications were made.  

## 2013-11-16 NOTE — Progress Notes (Signed)
Patient ID: Rc Amison, male    DOB: [birth year], 80 y.o.   MRN: [id number]  HPI Comments: Mr. Malmstrom is a very pleasant 80 year old gentleman with a history of high cholesterol, coronary plaquing seen on chest x-ray , hypertension, diabetes hemoglobin A1c 6.6 with no known coronary artery disease, patient of Dr. Golden Pop, previous episode of left arm numbness and weakness. He was unable to move his left arm for 30 minutes. Symptoms resolved in the emergency room. Workup was essentially negative including CT scan, MRI, carotid ultrasound and echocardiogram. He did have mild aortic valve stenosis. He was discharged on aspirin.  In followup today, he reports that he is doing well. 12/14/2012 he had popliteal aneurysm surgery at [facility]. Other leg was done 05/21/2013. It has taken him some time to recover, now he feels well. He is sedentary at baseline. No regular walking or exercise. He comes to take care of his wife who has medical issues. He does report having some fatigue. Denies any orthostasis.  Recent lab work showing total cholesterol 140, LDL 53, HDL 39, hemoglobin A1c 6.86 Previous echocardiogram and stress test  were essentially normal Echocardiogram showed normal LV function with systolic ejection fraction greater than 55%, evidence of diastolic relaxation abnormality, normal right ventricular systolic pressures, mild aortic valve regurgitation, borderline to mild aortic valve stenosis  Mild plaquing noted in the carotid bilaterally 3.7 cm abdominal aortic aneurysm   EKG shows normal sinus rhythm with sinus arrhythmia, rate 70 beats per minute, poor R wave progression through the anterior precordial leads, T wave abnormality seen in 3 and aVF. This was seen in March 2013    Outpatient Encounter Prescriptions as of 11/16/2013  Medication Sig  . aspirin 325 MG tablet Take 81 mg by mouth daily.   . benazepril (LOTENSIN) 20 MG tablet Take 20 mg by mouth daily.  . Colesevelam HCl  (WELCHOL PO) Take 6 tablets once daily by mouth  . cyanocobalamin 1000 MCG tablet Inject 100 mcg as directed every 30 (thirty) days.  . ferrous sulfate 325 (65 FE) MG tablet Take 325 mg by mouth daily with breakfast.  . furosemide (LASIX) 20 MG tablet Take 1 tablet (20 mg total) by mouth daily. Take as directed.  Marland Kitchen glucosamine-chondroitin 500-400 MG tablet Take 1 tablet by mouth daily.  Marland Kitchen glucose blood test strip 1 each by Other route 2 (two) times daily. Use as instructed  . glyBURIDE-metformin (GLUCOVANCE) 5-500 MG per tablet Take 1 tablet by mouth 2 (two) times daily with a meal.  . metoprolol succinate (TOPROL-XL) 50 MG 24 hr tablet Take 50 mg by mouth daily. Take with or immediately following a meal.  . Multiple Vitamins-Minerals (MULTIVITAMIN PO) Take 1 tablet by mouth daily.  . pantoprazole (PROTONIX) 40 MG tablet Take 40 mg by mouth 2 (two) times daily.   Marland Kitchen PHENobarbital (LUMINAL) 32.4 MG tablet Take 32.4 mg by mouth daily.  . pioglitazone (ACTOS) 15 MG tablet Take 15 mg by mouth daily.  . rosuvastatin (CRESTOR) 10 MG tablet Take 10 mg by mouth every other day.    Review of Systems  Constitutional: Positive for fatigue.  HENT: Negative.   Eyes: Negative.   Respiratory: Negative.   Cardiovascular: Negative.   Gastrointestinal: Negative.   Endocrine: Negative.   Musculoskeletal: Negative.   Skin: Negative.   Allergic/Immunologic: Negative.   Neurological: Negative.   Hematological: Negative.   Psychiatric/Behavioral: Negative.   All other systems reviewed and are negative.   BP 105/64  Pulse  70  Ht 5' 9.5" (1.765 m)  Wt 197 lb (89.359 kg)  BMI 28.68 kg/m2  Physical Exam  Nursing note and vitals reviewed. Constitutional: He is oriented to person, place, and time. He appears well-developed and well-nourished.  HENT:  Head: Normocephalic.  Nose: Nose normal.  Mouth/Throat: Oropharynx is clear and moist.  Eyes: Conjunctivae are normal. Pupils are equal, round, and  reactive to light.  Neck: Normal range of motion. Neck supple. No JVD present.  Cardiovascular: Normal rate, regular rhythm, S1 normal, S2 normal and intact distal pulses.  Exam reveals no gallop and no friction rub.   Murmur heard.  Crescendo systolic murmur is present with a grade of 2/6  Pulmonary/Chest: Effort normal and breath sounds normal. No respiratory distress. He has no wheezes. He has no rales. He exhibits no tenderness.  Abdominal: Soft. Bowel sounds are normal. He exhibits no distension. There is no tenderness.  Musculoskeletal: Normal range of motion. He exhibits no edema and no tenderness.  Lymphadenopathy:    He has no cervical adenopathy.  Neurological: He is alert and oriented to person, place, and time. Coordination normal.  Skin: Skin is warm and dry. No rash noted. No erythema.  Psychiatric: He has a normal mood and affect. His behavior is normal. Judgment and thought content normal.      Assessment and Plan

## 2013-11-16 NOTE — Assessment & Plan Note (Signed)
Stable AAA over sequential ultrasounds.

## 2013-11-16 NOTE — Assessment & Plan Note (Signed)
Blood pressure is low. He does have some fatigue. We have suggested he decrease his enalapril in half down to 10 mg daily. Blood pressure on my recheck today was 612 systolic.

## 2013-11-16 NOTE — Assessment & Plan Note (Signed)
Benazepril dose decreased to 10 mg daily as above

## 2013-11-16 NOTE — Patient Instructions (Addendum)
You are doing well. Blood pressure is low Please decrease the benazepril down to 10 mg a day  Please call us if you have new issues that need to be addressed before your next appt.  Your physician wants you to follow-up in: 6 months.  You will receive a reminder letter in the mail two months in advance. If you don't receive a letter, please call our office to schedule the follow-up appointment.

## 2013-11-16 NOTE — Assessment & Plan Note (Signed)
Diabetes recently well controlled. Encouraged him to start a regular walking program

## 2014-03-17 ENCOUNTER — Other Ambulatory Visit: Payer: Self-pay | Admitting: Cardiovascular Disease

## 2014-05-17 ENCOUNTER — Ambulatory Visit: Payer: Medicare Other | Admitting: Cardiovascular Disease

## 2014-05-26 ENCOUNTER — Encounter: Payer: Self-pay | Admitting: Cardiovascular Disease

## 2014-05-26 ENCOUNTER — Ambulatory Visit (INDEPENDENT_AMBULATORY_CARE_PROVIDER_SITE_OTHER): Payer: Medicare Other | Admitting: Cardiovascular Disease

## 2014-05-26 VITALS — BP 112/60 | HR 64 | Ht 69.5 in | Wt 194.0 lb

## 2014-05-26 DIAGNOSIS — R5382 Chronic fatigue, unspecified: Secondary | ICD-10-CM

## 2014-05-26 DIAGNOSIS — I159 Secondary hypertension, unspecified: Secondary | ICD-10-CM

## 2014-05-26 DIAGNOSIS — I714 Abdominal aortic aneurysm, without rupture, unspecified: Secondary | ICD-10-CM

## 2014-05-26 DIAGNOSIS — E785 Hyperlipidemia, unspecified: Secondary | ICD-10-CM

## 2014-05-26 DIAGNOSIS — G458 Other transient cerebral ischemic attacks and related syndromes: Secondary | ICD-10-CM

## 2014-05-26 DIAGNOSIS — E1159 Type 2 diabetes mellitus with other circulatory complications: Secondary | ICD-10-CM

## 2014-05-26 NOTE — Patient Instructions (Signed)
You are doing well. No medication changes were made.  Please call us if you have new issues that need to be addressed before your next appt.  Your physician wants you to follow-up in: 12 months.  You will receive a reminder letter in the mail two months in advance. If you don't receive a letter, please call our office to schedule the follow-up appointment. 

## 2014-05-26 NOTE — Assessment & Plan Note (Signed)
Hemoglobin A1c 7.2, above his usual baseline. Recommended he watch his diet, cut back on his carbohydrates

## 2014-05-26 NOTE — Assessment & Plan Note (Signed)
Continued fatigue. Recommended regular exercise program. Legs are getting progressively weaker.

## 2014-05-26 NOTE — Assessment & Plan Note (Signed)
Blood pressure is well controlled on today's visit. No changes made to the medications. 

## 2014-05-26 NOTE — Assessment & Plan Note (Signed)
No further episodes of TIA or stroke. Encouraged him to stay on his aspirin.

## 2014-05-26 NOTE — Progress Notes (Signed)
Patient ID: Timothy [name], male    DOB: June 30, 1929, 80 y.o.   MRN: [id number]  HPI Comments: Mr. Pacer is a very pleasant 80 year old gentleman with a history of high cholesterol,  hypertension, diabetes hemoglobin A1c 6.6 with no known coronary artery disease, patient of Dr. Golden Pop, previous episode of left arm numbness and weakness. He was unable to move his left arm for 30 minutes. Symptoms resolved in the emergency room. Workup was essentially negative including CT scan, MRI, carotid ultrasound and echocardiogram. He did have mild aortic valve stenosis. He was discharged on aspirin. He has a 3.5 cm abdominal aortic aneurysm seen on ultrasound in 2014 He presents today for follow-up of his high cholesterol, PAD.  In follow-up, he reports that he is doing relatively well. He reports having recent diagnosis of early-stage Parkinson's. He has a worsening tremor. Also reports having significant low back pain and 2 slipped disks. Is uncertain if he will need surgery. Minimal leg edema. Otherwise reports that he is not very active, in general he and his wife are sedentary. Legs are getting weaker as he is not exercising. He spends much of his time taking care of his wife who has medical issues as well He reports most recent hemoglobin A1c 7.2  EKG on today's visit shows normal sinus rhythm with APCs, rate 64 bpm, no significant ST or T-wave changes   Other past medical history  12/14/2012 he had popliteal aneurysm surgery at [facility]. Other leg was done 05/21/2013.   total cholesterol 140, LDL 53, HDL 39, hemoglobin A1c 6.86  Previous echocardiogram and stress test  were essentially normal Echocardiogram showed normal LV function with systolic ejection fraction greater than 55%, evidence of diastolic relaxation abnormality, normal right ventricular systolic pressures, mild aortic valve regurgitation, borderline to mild aortic valve stenosis  Mild plaquing noted in the carotid  bilaterally 3.7 cm abdominal aortic aneurysm    Outpatient Encounter Prescriptions as of 05/26/2014  Medication Sig  . aspirin 325 MG tablet Take 81 mg by mouth daily.   . benazepril (LOTENSIN) 10 MG tablet Take 1 tablet (10 mg total) by mouth daily.  . Colesevelam HCl (WELCHOL PO) Take 6 tablets once daily by mouth  . cyanocobalamin 1000 MCG tablet Inject 100 mcg as directed every 30 (thirty) days.  . ferrous sulfate 325 (65 FE) MG tablet Take 325 mg by mouth daily with breakfast.  . furosemide (LASIX) 20 MG tablet TAKE ONE TABLET BY MOUTH EVERY DAY  . glucosamine-chondroitin 500-400 MG tablet Take 1 tablet by mouth daily.  Marland Kitchen glucose blood test strip 1 each by Other route 2 (two) times daily. Use as instructed  . glyBURIDE-metformin (GLUCOVANCE) 5-500 MG per tablet Take 1 tablet by mouth 2 (two) times daily with a meal.  . metoprolol succinate (TOPROL-XL) 50 MG 24 hr tablet Take 50 mg by mouth daily. Take with or immediately following a meal.  . Multiple Vitamins-Minerals (MULTIVITAMIN PO) Take 1 tablet by mouth daily.  . pantoprazole (PROTONIX) 40 MG tablet Take 40 mg by mouth 2 (two) times daily.   Marland Kitchen PHENobarbital (LUMINAL) 32.4 MG tablet Take 32.4 mg by mouth daily.  . pioglitazone (ACTOS) 15 MG tablet Take 15 mg by mouth daily.  . rosuvastatin (CRESTOR) 10 MG tablet Take 10 mg by mouth every other day.   . traMADol (ULTRAM) 50 MG tablet Take 50 mg by mouth 2 (two) times daily as needed.    Social history  reports that he has never  smoked. He has never used smokeless tobacco. He reports that he does not drink alcohol or use illicit drugs.  Review of Systems  Constitutional: Positive for fatigue.  Respiratory: Negative.   Cardiovascular: Negative.   Gastrointestinal: Negative.   Musculoskeletal: Positive for back pain, arthralgias and gait problem.  Neurological: Negative.   Hematological: Negative.   Psychiatric/Behavioral: Negative.   All other systems reviewed and are  negative.   BP 112/60 mmHg  Pulse 64  Ht 5' 9.5" (1.765 m)  Wt 194 lb (87.998 kg)  BMI 28.25 kg/m2  Physical Exam  Constitutional: He is oriented to person, place, and time. He appears well-developed and well-nourished.  HENT:  Head: Normocephalic.  Nose: Nose normal.  Mouth/Throat: Oropharynx is clear and moist.  Eyes: Conjunctivae are normal. Pupils are equal, round, and reactive to light.  Neck: Normal range of motion. Neck supple. No JVD present.  Cardiovascular: Normal rate, regular rhythm, S1 normal, S2 normal and intact distal pulses.  Frequent extrasystoles are present. Exam reveals no gallop and no friction rub.   Murmur heard.  Systolic murmur is present with a grade of 2/6  Pulmonary/Chest: Effort normal and breath sounds normal. No respiratory distress. He has no wheezes. He has no rales. He exhibits no tenderness.  Abdominal: Soft. Bowel sounds are normal. He exhibits no distension. There is no tenderness.  Musculoskeletal: Normal range of motion. He exhibits no edema or tenderness.  Lymphadenopathy:    He has no cervical adenopathy.  Neurological: He is alert and oriented to person, place, and time. Coordination normal.  Skin: Skin is warm and dry. No rash noted. No erythema.  Psychiatric: He has a normal mood and affect. His behavior is normal. Judgment and thought content normal.      Assessment and Plan   Nursing note and vitals reviewed.

## 2014-05-26 NOTE — Assessment & Plan Note (Signed)
Cholesterol is at goal on the current lipid regimen. No changes to the medications were made.  

## 2014-05-26 NOTE — Assessment & Plan Note (Signed)
3.5 cm abdominal aortic aneurysm in 2014. Repeat ultrasound in 2016

## 2014-08-08 DIAGNOSIS — E119 Type 2 diabetes mellitus without complications: Secondary | ICD-10-CM | POA: Diagnosis not present

## 2014-08-08 DIAGNOSIS — I10 Essential (primary) hypertension: Secondary | ICD-10-CM | POA: Diagnosis not present

## 2014-08-08 DIAGNOSIS — E785 Hyperlipidemia, unspecified: Secondary | ICD-10-CM | POA: Diagnosis not present

## 2014-08-30 DIAGNOSIS — M4806 Spinal stenosis, lumbar region: Secondary | ICD-10-CM | POA: Diagnosis not present

## 2014-08-31 DIAGNOSIS — Z01818 Encounter for other preprocedural examination: Secondary | ICD-10-CM | POA: Diagnosis not present

## 2014-08-31 DIAGNOSIS — I361 Nonrheumatic tricuspid (valve) insufficiency: Secondary | ICD-10-CM | POA: Diagnosis not present

## 2014-08-31 DIAGNOSIS — I351 Nonrheumatic aortic (valve) insufficiency: Secondary | ICD-10-CM | POA: Diagnosis not present

## 2014-08-31 DIAGNOSIS — M4806 Spinal stenosis, lumbar region: Secondary | ICD-10-CM | POA: Diagnosis not present

## 2014-09-06 DIAGNOSIS — J069 Acute upper respiratory infection, unspecified: Secondary | ICD-10-CM | POA: Diagnosis not present

## 2014-09-06 DIAGNOSIS — J40 Bronchitis, not specified as acute or chronic: Secondary | ICD-10-CM | POA: Diagnosis not present

## 2014-09-08 ENCOUNTER — Observation Stay: Payer: Self-pay | Admitting: Internal Medicine

## 2014-09-08 DIAGNOSIS — G8929 Other chronic pain: Secondary | ICD-10-CM | POA: Diagnosis not present

## 2014-09-08 DIAGNOSIS — E119 Type 2 diabetes mellitus without complications: Secondary | ICD-10-CM | POA: Diagnosis not present

## 2014-09-08 DIAGNOSIS — M545 Low back pain: Secondary | ICD-10-CM | POA: Diagnosis not present

## 2014-09-08 DIAGNOSIS — I10 Essential (primary) hypertension: Secondary | ICD-10-CM | POA: Diagnosis not present

## 2014-09-08 DIAGNOSIS — M5442 Lumbago with sciatica, left side: Secondary | ICD-10-CM | POA: Diagnosis not present

## 2014-09-08 DIAGNOSIS — Z8673 Personal history of transient ischemic attack (TIA), and cerebral infarction without residual deficits: Secondary | ICD-10-CM | POA: Diagnosis not present

## 2014-09-08 DIAGNOSIS — G459 Transient cerebral ischemic attack, unspecified: Secondary | ICD-10-CM | POA: Diagnosis not present

## 2014-09-08 DIAGNOSIS — J90 Pleural effusion, not elsewhere classified: Secondary | ICD-10-CM | POA: Diagnosis not present

## 2014-09-08 DIAGNOSIS — R531 Weakness: Secondary | ICD-10-CM | POA: Diagnosis not present

## 2014-09-08 DIAGNOSIS — H538 Other visual disturbances: Secondary | ICD-10-CM | POA: Diagnosis not present

## 2014-09-08 DIAGNOSIS — Z7982 Long term (current) use of aspirin: Secondary | ICD-10-CM | POA: Diagnosis not present

## 2014-09-08 DIAGNOSIS — H539 Unspecified visual disturbance: Secondary | ICD-10-CM | POA: Diagnosis not present

## 2014-09-08 DIAGNOSIS — K219 Gastro-esophageal reflux disease without esophagitis: Secondary | ICD-10-CM | POA: Diagnosis not present

## 2014-09-09 DIAGNOSIS — E119 Type 2 diabetes mellitus without complications: Secondary | ICD-10-CM | POA: Diagnosis not present

## 2014-09-09 DIAGNOSIS — I10 Essential (primary) hypertension: Secondary | ICD-10-CM | POA: Diagnosis not present

## 2014-09-09 DIAGNOSIS — H538 Other visual disturbances: Secondary | ICD-10-CM | POA: Diagnosis not present

## 2014-09-09 DIAGNOSIS — G459 Transient cerebral ischemic attack, unspecified: Secondary | ICD-10-CM | POA: Diagnosis not present

## 2014-09-09 DIAGNOSIS — M5442 Lumbago with sciatica, left side: Secondary | ICD-10-CM | POA: Diagnosis not present

## 2014-09-09 DIAGNOSIS — K219 Gastro-esophageal reflux disease without esophagitis: Secondary | ICD-10-CM | POA: Diagnosis not present

## 2014-09-09 DIAGNOSIS — R531 Weakness: Secondary | ICD-10-CM | POA: Diagnosis not present

## 2014-09-11 DIAGNOSIS — F419 Anxiety disorder, unspecified: Secondary | ICD-10-CM | POA: Diagnosis not present

## 2014-09-11 DIAGNOSIS — E119 Type 2 diabetes mellitus without complications: Secondary | ICD-10-CM | POA: Diagnosis not present

## 2014-09-11 DIAGNOSIS — Z8673 Personal history of transient ischemic attack (TIA), and cerebral infarction without residual deficits: Secondary | ICD-10-CM | POA: Diagnosis not present

## 2014-09-11 DIAGNOSIS — I10 Essential (primary) hypertension: Secondary | ICD-10-CM | POA: Diagnosis not present

## 2014-09-11 DIAGNOSIS — M4806 Spinal stenosis, lumbar region: Secondary | ICD-10-CM | POA: Diagnosis not present

## 2014-09-13 DIAGNOSIS — I10 Essential (primary) hypertension: Secondary | ICD-10-CM | POA: Diagnosis not present

## 2014-09-13 DIAGNOSIS — Z8673 Personal history of transient ischemic attack (TIA), and cerebral infarction without residual deficits: Secondary | ICD-10-CM | POA: Diagnosis not present

## 2014-09-13 DIAGNOSIS — F419 Anxiety disorder, unspecified: Secondary | ICD-10-CM | POA: Diagnosis not present

## 2014-09-13 DIAGNOSIS — M4806 Spinal stenosis, lumbar region: Secondary | ICD-10-CM | POA: Diagnosis not present

## 2014-09-13 DIAGNOSIS — E119 Type 2 diabetes mellitus without complications: Secondary | ICD-10-CM | POA: Diagnosis not present

## 2014-09-15 DIAGNOSIS — F419 Anxiety disorder, unspecified: Secondary | ICD-10-CM | POA: Diagnosis not present

## 2014-09-15 DIAGNOSIS — Z8673 Personal history of transient ischemic attack (TIA), and cerebral infarction without residual deficits: Secondary | ICD-10-CM | POA: Diagnosis not present

## 2014-09-15 DIAGNOSIS — M4806 Spinal stenosis, lumbar region: Secondary | ICD-10-CM | POA: Diagnosis not present

## 2014-09-15 DIAGNOSIS — I10 Essential (primary) hypertension: Secondary | ICD-10-CM | POA: Diagnosis not present

## 2014-09-15 DIAGNOSIS — E119 Type 2 diabetes mellitus without complications: Secondary | ICD-10-CM | POA: Diagnosis not present

## 2014-09-18 DIAGNOSIS — F419 Anxiety disorder, unspecified: Secondary | ICD-10-CM | POA: Diagnosis not present

## 2014-09-18 DIAGNOSIS — E119 Type 2 diabetes mellitus without complications: Secondary | ICD-10-CM | POA: Diagnosis not present

## 2014-09-18 DIAGNOSIS — M4806 Spinal stenosis, lumbar region: Secondary | ICD-10-CM | POA: Diagnosis not present

## 2014-09-18 DIAGNOSIS — I10 Essential (primary) hypertension: Secondary | ICD-10-CM | POA: Diagnosis not present

## 2014-09-18 DIAGNOSIS — Z8673 Personal history of transient ischemic attack (TIA), and cerebral infarction without residual deficits: Secondary | ICD-10-CM | POA: Diagnosis not present

## 2014-09-20 DIAGNOSIS — E119 Type 2 diabetes mellitus without complications: Secondary | ICD-10-CM | POA: Diagnosis not present

## 2014-09-20 DIAGNOSIS — M4806 Spinal stenosis, lumbar region: Secondary | ICD-10-CM | POA: Diagnosis not present

## 2014-09-20 DIAGNOSIS — F419 Anxiety disorder, unspecified: Secondary | ICD-10-CM | POA: Diagnosis not present

## 2014-09-20 DIAGNOSIS — I10 Essential (primary) hypertension: Secondary | ICD-10-CM | POA: Diagnosis not present

## 2014-09-20 DIAGNOSIS — Z8673 Personal history of transient ischemic attack (TIA), and cerebral infarction without residual deficits: Secondary | ICD-10-CM | POA: Diagnosis not present

## 2014-09-25 DIAGNOSIS — M4806 Spinal stenosis, lumbar region: Secondary | ICD-10-CM | POA: Diagnosis not present

## 2014-09-25 DIAGNOSIS — F419 Anxiety disorder, unspecified: Secondary | ICD-10-CM | POA: Diagnosis not present

## 2014-09-25 DIAGNOSIS — E119 Type 2 diabetes mellitus without complications: Secondary | ICD-10-CM | POA: Diagnosis not present

## 2014-09-25 DIAGNOSIS — Z8673 Personal history of transient ischemic attack (TIA), and cerebral infarction without residual deficits: Secondary | ICD-10-CM | POA: Diagnosis not present

## 2014-09-25 DIAGNOSIS — I10 Essential (primary) hypertension: Secondary | ICD-10-CM | POA: Diagnosis not present

## 2014-10-05 DIAGNOSIS — E119 Type 2 diabetes mellitus without complications: Secondary | ICD-10-CM | POA: Diagnosis not present

## 2014-10-05 DIAGNOSIS — M79671 Pain in right foot: Secondary | ICD-10-CM | POA: Diagnosis not present

## 2014-10-05 DIAGNOSIS — M79672 Pain in left foot: Secondary | ICD-10-CM | POA: Diagnosis not present

## 2014-10-05 DIAGNOSIS — B351 Tinea unguium: Secondary | ICD-10-CM | POA: Diagnosis not present

## 2014-10-10 DIAGNOSIS — E785 Hyperlipidemia, unspecified: Secondary | ICD-10-CM | POA: Diagnosis not present

## 2014-10-10 DIAGNOSIS — I10 Essential (primary) hypertension: Secondary | ICD-10-CM | POA: Diagnosis not present

## 2014-10-10 DIAGNOSIS — N182 Chronic kidney disease, stage 2 (mild): Secondary | ICD-10-CM | POA: Diagnosis not present

## 2014-10-10 DIAGNOSIS — Z0000 Encounter for general adult medical examination without abnormal findings: Secondary | ICD-10-CM | POA: Diagnosis not present

## 2014-10-10 DIAGNOSIS — E119 Type 2 diabetes mellitus without complications: Secondary | ICD-10-CM | POA: Diagnosis not present

## 2014-10-10 DIAGNOSIS — M889 Osteitis deformans of unspecified bone: Secondary | ICD-10-CM | POA: Diagnosis not present

## 2014-10-29 NOTE — Consult Note (Signed)
General Aspect 81 year old male with diabetes, hypertension who presents with numbness and weakness of the left arm. Cardioogy was consulted for possible TIA and murmur. He was schedule to be seen in our office later this week.   He reports he was sitting around yesterday afternoon (getting out of his car?)  when he developed left arm numbness and weakness. He just was unable to move the left arm for 30 minutes. He had no other neurological deficits. Over the past month or so he has just been confused at times. Sx resolved in the ER.  He has not had any symptoms like this in the past.No recent chest pain or SOB.    Present Illness . PAST MEDICAL HISTORY:  1. Hypertension. 2. Diabetes.   MEDICATIONS: We do not have the complete list at this time. These are the medications we have so far.  1. Lopressor 50 mg daily.  2. Lipitor 10 mg at bedtime.  3. Glipizide 5 mg b.i.d.  4. Centrum Silver 1 tablet daily.  5. Aspirin 81 mg daily.  6. Actos 15 mg daily. Once again we do not have the complete list. The nurse is working on this.    ALLERGIES: No known drug allergies.   PAST SURGICAL HISTORY:  1. Appendectomy. 2. Cholecystectomy.  3. Tonsillectomy.  4. Hernia repair.   FAMILY HISTORY: Positive for cancer, aneurysm and diabetes.   SOCIAL HISTORY: No tobacco, alcohol or IV drug use.   Physical Exam:   GEN well developed, well nourished, no acute distress, obese    HEENT red conjunctivae    NECK supple     RESP normal resp effort  clear BS     CARD Regular rate and rhythm     ABD denies tenderness  soft     LYMPH negative neck    EXTR negative edema    SKIN normal to palpation    NEURO motor/sensory function intact    PSYCH alert, A+O to time, place, person, good insight   Review of Systems:   Subjective/Chief Complaint left arm numbness, resolved    General: No Complaints     Skin: No Complaints     ENT: No Complaints     Eyes: No Complaints     Neck: No  Complaints     Respiratory: No Complaints     Cardiovascular: No Complaints     Gastrointestinal: No Complaints     Genitourinary: No Complaints     Vascular: No Complaints     Musculoskeletal: No Complaints     Neurologic: No Complaints     Hematologic: No Complaints     Endocrine: No Complaints     Psychiatric: No Complaints     Review of Systems: All other systems were reviewed and found to be negative     Medications/Allergies Reviewed Medications/Allergies reviewed           Admit Diagnosis:   TIA: 25-Dec-2011, Active, TIA      Chronic Problem:   Transient ischemic attack (TIA): (435.9) 24-Dec-2011, Active, ICD9, Unspecified transient cerebral ischemia  Lab Results:  Hepatic:  19-Jun-13 18:15    Bilirubin, Total 0.3   Alkaline Phosphatase 57   SGPT (ALT) 26 (12-78 NOTE: NEW REFERENCE RANGE 05/30/2011)   SGOT (AST) 32   Total Protein, Serum 6.8   Albumin, Serum 4.0  Routine Chem:  19-Jun-13 18:15    Glucose, Serum  160   BUN  22   Creatinine (comp) 1.30   Sodium, Serum 139  Potassium, Serum 3.9   Chloride, Serum 102   CO2, Serum 25   Calcium (Total), Serum  8.3   Osmolality (calc) 284   eGFR (African American)  60   eGFR (Non-African American)  52 (eGFR values <41m/min/1.73 m2 may be an indication of chronic kidney disease (CKD). Calculated eGFR is useful in patients with stable renal function. The eGFR calculation will not be reliable in acutely ill patients when serum creatinine is changing rapidly. It is not useful in  patients on dialysis. The eGFR calculation may not be applicable to patients at the low and high extremes of body sizes, pregnant women, and vegetarians.)   Anion Gap 12  20-Jun-13 04:00    Cholesterol, Serum 109   Triglycerides, Serum 195   HDL (INHOUSE)  29   VLDL Cholesterol Calculated 39   LDL Cholesterol Calculated 41 (Result(s) reported on 25 Dec 2011 at 06:12AM.)  Cardiac:  19-Jun-13 18:15    Troponin I < 0.02  (0.00-0.05 0.05 ng/mL or less: NEGATIVE  Repeat testing in 3-6 hrs  if clinically indicated. >0.05 ng/mL: POTENTIAL  MYOCARDIAL INJURY. Repeat  testing in 3-6 hrs if  clinically indicated. NOTE: An increase or decrease  of 30% or more on serial  testing suggests a  clinically important change)   CK, Total 96   CPK-MB, Serum 1.1 (Result(s) reported on 24 Dec 2011 at 07:24PM.)  Routine Coag:  19-Jun-13 18:15    Prothrombin 13.0   INR 0.9 (INR reference interval applies to patients on anticoagulant therapy. A single INR therapeutic range for coumarins is not optimal for all indications; however, the suggested range for most indications is 2.0 - 3.0. Exceptions to the INR Reference Range may include: Prosthetic heart valves, acute myocardial infarction, prevention of myocardial infarction, and combinations of aspirin and anticoagulant. The need for a higher or lower target INR must be assessed individually. Reference: The Pharmacology and Management of the Vitamin K  antagonists: the seventh ACCP Conference on Antithrombotic and Thrombolytic Therapy. CMGNOI.3704Sept:126 (3suppl): [id number] A HCT value >55% may artifactually increase the PT.  In one study,  the increase was an average of 25%. Reference:  "Effect on Routine and Special Coagulation Testing Values of Citrate Anticoagulant Adjustment in Patients with High HCT Values." American Journal of Clinical Pathology 2006;126:400-405.)   Activated PTT (APTT) 29.7 (A HCT value >55% may artifactually increase the APTT. In one study, the increase was an average of 19%. Reference: "Effect on Routine and Special Coagulation Testing Values of Citrate Anticoagulant Adjustment in Patients with High HCT Values." American Journal of Clinical Pathology 2006;126:400-405.)  Routine Hem:  19-Jun-13 18:15    WBC (CBC) 6.4   RBC (CBC)  3.67   Hemoglobin (CBC)  11.6   Hematocrit (CBC)  35.6   Platelet Count (CBC) 192 (Result(s) reported on  24 Dec 2011 at 07:18PM.)   MCV 97   MCH 31.6   MCHC 32.6   RDW 14.0   EKG:   Interpretation NSR with rate 70 bpm, no significant ST or  wave changes    No Known Allergies:   Vital Signs/Nurse's Notes:  **Vital Signs.:   20-Jun-13 07:32   Vital Signs Type Routine   Temperature Temperature (F) 98.2   Celsius 36.7   Temperature Source Oral   Pulse Pulse 62   Pulse source per vital sign device   Respirations Respirations 16   Systolic BP Systolic BP 1888  Diastolic BP (mmHg) Diastolic BP (mmHg) 60   Mean BP  75   BP Source vital sign device   Pulse Ox % Pulse Ox % 97   Pulse Ox Activity Level  At rest   Oxygen Delivery Room Air/ 21 %     Impression 81 year old male with diabetes, hypertension who presents with numbness and weakness of the left arm. Cardioogy was consulted for possible TIA and murmur. He was schedule to be seen in our office later this week.   1) Left arm numbness and weakness Consider TIA, CT negative MRI pending Carotid and echo pending -Continue aspirin, statin  2) Murmur: echo pending Will try to obtain echo from years ago.  3) HTN: Would continue current meds.  4) Diabetes: Continue current outpt meds   Electronic Signatures: Ida Rogue (MD)  (Signed 20-Jun-13 09:49)  Authored: General Aspect/Present Illness, History and Physical Exam, Review of System, Past Medical History, Health Issues, Home Medications, Labs, EKG , Allergies, Vital Signs/Nurse's Notes, Impression/Plan   Last Updated: 20-Jun-13 09:49 by Ida Rogue (MD)

## 2014-10-29 NOTE — H&P (Signed)
PATIENT NAME:  Timothy [name], Timothy [name] MR#:  [id number] DATE OF BIRTH:  [birth year]  DATE OF ADMISSION:  12/24/2011  PRIMARY CARE PHYSICIAN: Dr. Jeananne Rama  CHIEF COMPLAINT: Left arm weakness.  HISTORY OF PRESENT ILLNESS: Very pleasant 81 year old male with diabetes, hypertension who presents with above complaint. At 5:30 this afternoon while patient was just kind of sitting around he developed left arm numbness and weakness. He just was unable to move the left arm. He had no other neurological deficits. Over the past month or so he has just been confused at times, just had some weakness on and off, actually was going to go to see Dr. Rockey Situ, the cardiologist, at the end of the week because the family thought he may have some cardiac issues. In any case, today he comes in with complaints as mentioned above and hospitalist service are consulted for transient ischemic attack workup. Patient's symptoms have now resolved. They lasted about an hour and a half. He has not had any symptoms like this in the past.   REVIEW OF SYSTEMS: CONSTITUTIONAL: No fever, fatigue, weakness, weight loss, weight gain. EYES: No blurry or double vision, glaucoma. ENT: No ear pain, hearing loss. RESPIRATORY: No cough, wheezing, hemoptysis, chronic obstructive pulmonary disease. CARDIOVASCULAR: No chest pain, orthopnea, edema, arrhythmia, dyspnea on exertion, palpitations, syncope. GASTROINTESTINAL: No nausea, vomiting, diarrhea, abdominal pain, melena, or ulcers. GENITOURINARY: No dysuria, hematuria. ENDOCRINE: No polyuria, polydipsia. HEME/LYMPH: No anemia, easy bruising. SKIN: No rash or lesions. MUSCULOSKELETAL: No pain in the neck or shoulders. NEUROLOGICAL: No history of cerebrovascular accident, transient ischemic attack. PSYCH: No history of anxiety, depression.   PAST MEDICAL HISTORY:  1. Hypertension. 2. Diabetes.   MEDICATIONS: We do not have the complete list at this time. These are the medications we have so far.  1. Lopressor  50 mg daily.  2. Lipitor 10 mg at bedtime.  3. Glipizide 5 mg b.i.d.  4. Centrum Silver 1 tablet daily.  5. Aspirin 81 mg daily.  6. Actos 15 mg daily. Once again we do not have the complete list. The nurse is working on this.    ALLERGIES: No known drug allergies.   PAST SURGICAL HISTORY:  1. Appendectomy. 2. Cholecystectomy.  3. Tonsillectomy.  4. Hernia repair.   FAMILY HISTORY: Positive for cancer, aneurysm and diabetes.   SOCIAL HISTORY: No tobacco, alcohol or IV drug use.   PHYSICAL EXAMINATION:  VITAL SIGNS: Temperature 96.7, pulse 78, respirations 20, blood pressure 149/73, 96% on room air.   GENERAL: Patient is alert, oriented, not in acute distress.   HEENT: Head is atraumatic. Pupils are round, reactive. Sclerae anicteric. Mucous membranes are moist. Oropharynx is clear.   NECK: Supple without jugular venous distention, carotid bruit, enlarged thyroid.   CARDIOVASCULAR: Regular rate and rhythm. There is a 3/6 systolic ejection murmur heard best at the left sternal border without radiation.   LUNGS: Clear to auscultation without crackles, rales, rhonchi or wheezing. Normal percussion.   ABDOMEN: Bowel sounds are positive. Nontender, nondistended. Hard to appreciate organomegaly due to body habitus.   EXTREMITIES: No clubbing, cyanosis, edema.   NEUROLOGIC: Cranial nerves II through XII are intact. There are no focal deficits. Sensation is intact bilaterally and symmetrically. Reflexes are 2+ bilaterally and symmetrically.   SKIN: Without rash or lesions.   LABORATORY, DIAGNOSTIC AND RADIOLOGICAL DATA: White blood cells 6.4, hemoglobin 11.6, hematocrit 35.6, platelets 192, INR 0.9, CK 96, troponin less than 0.02. CPK-MB 1.1. CT of the head showed no acute intracranial hemorrhage or  CVA. EKG shows no ST elevation or depression.   ASSESSMENT AND PLAN: 81 year old male with diabetes, hypertension presents with left arm weakness. Rule out transient ischemic attack.   1. Transient ischemic attack. Patient's symptoms are consistent with a transient ischemic attack. We will do the work-up for transient ischemic attack/cerebrovascular accident including a 2-D echocardiogram, carotid Doppler's and MRI. Change the aspirin 81 to aspirin 325 mg daily. Continue his atorvastatin. Will also check a lipid panel.  2. Heart murmur. Will go ahead and consult Dr. Rockey Situ as the patient was already scheduled to see Dr. Rockey Situ and the patient would like him while he is in the hospital and will order an echocardiogram.  3. Hyperlipidemia. Continue atorvastatin.  4. Diabetes. Continue glipizide, sliding scale insulin.  5. Hypertension. Will continue Lopressor.  6. We will need to know the patient's complete medication list prior to discharge. Patient is a FULL CODE status.  TIME SPENT: Approximately 45 minutes.   ____________________________ Donell Beers. Benjie Karvonen, MD spm:cms D: 12/24/2011 19:39:33 ET T: 12/25/2011 06:16:03 ET JOB#: [id number]  cc: Walton Digilio P. Benjie Karvonen, MD, <Dictator> Guadalupe Maple, MD Donell Beers Taffy Delconte MD ELECTRONICALLY SIGNED 12/25/2011 16:00

## 2014-10-29 NOTE — Discharge Summary (Signed)
PATIENT NAME:  Timothy [name], Timothy [name] MR#:  [id number] DATE OF BIRTH:  03-Apr-1929  DATE OF ADMISSION:  12/24/2011 DATE OF DISCHARGE:  12/25/2011  PRESENTING COMPLAINT: Left arm weakness.   DISCHARGE DIAGNOSES:  1. Left arm numbness and weakness, improved, suspected transient ischemic attack versus cervical degenerative joint disease.  2. Hypertension.  3. Type 2 diabetes.   CONDITION ON DISCHARGE: Fair.   MEDICATIONS:  1. Benazepril 20 mg p.o. daily.  2. Protonix 40 mg p.o. daily.  3. Crestor 10 mg daily.  4. Metoprolol ER 50 mg p.o. daily.  5. Glyburide/metformin 5/500, 1 tablet 2 times a day.  6. WelChol 625 mg 6 tablets daily.  7. Pioglitazone 15 mg daily.  8. Phenobarbital 32.4 mg p.o. daily at bedtime.  9. Centrum Silver daily.  10. Lasix 20 mg daily.  11. Chondroitin glucosamine 400/500 mg p.o. daily.  12. Aspirin 325 mg p.o. daily.   FOLLOW UP: Follow up with Dr. Golden Pop on 01/01/2012 at 8:30 a.m.    CONSULTATION: Cardiology consultation with Dr. Rockey Situ.  BRIEF SUMMARY OF HOSPITAL COURSE: Mr. Terrio is an 81 year old Caucasian gentleman with history of hypertension and diabetes came into the Emergency Room with complaints of left arm weakness. He was admitted with:  1. Suspected transient ischemic attack. Presented with left arm numbness, resolved after a few hours. Possibility of transient ischemic attack versus due to cervical degenerative joint disease. Patient denies any radicular pain or chest pain. His aspirin was increased to regular full dose which is 325 mg daily. Brain MRI negative. Carotid Doppler was negative for stenosis. Echo did not show any abnormality other than mild AS. He has ejection fraction was 55%.  2. Heart murmur on exam seen by Dr. Rockey Situ. Echo did not show any abnormalities other than mild AS. Cardiac enzymes are negative. EKG did not have any ST elevation or depression.  3. Hypertension. Benazepril and beta blocker were continued.  4. Type 2 diabetes.  His p.o. medications were continued.   5. Patient was seen by physical therapy. No PT needs at home. Seen by speech. Dietary recommendations were given. Hospital stay otherwise remained stable. Patient remained a FULL CODE.   TIME SPENT: 40 minutes.  ____________________________ Hart Rochester Posey Pronto, MD sap:cms D: 12/27/2011 15:35:30 ET T: 12/27/2011 15:49:13 ET JOB#: [id number]  cc: Anora Schwenke A. Posey Pronto, MD, <Dictator> Guadalupe Maple, MD [name], MD Ilda Basset MD ELECTRONICALLY SIGNED 12/27/2011 18:23

## 2014-11-01 DIAGNOSIS — Q762 Congenital spondylolisthesis: Secondary | ICD-10-CM | POA: Diagnosis not present

## 2014-11-01 DIAGNOSIS — M4806 Spinal stenosis, lumbar region: Secondary | ICD-10-CM | POA: Diagnosis not present

## 2014-11-01 DIAGNOSIS — M47816 Spondylosis without myelopathy or radiculopathy, lumbar region: Secondary | ICD-10-CM | POA: Diagnosis not present

## 2014-11-01 DIAGNOSIS — G458 Other transient cerebral ischemic attacks and related syndromes: Secondary | ICD-10-CM | POA: Diagnosis not present

## 2014-11-05 NOTE — Discharge Summary (Signed)
PATIENT NAME:  Timothy [name], Timothy [name] MR#:  [id number] DATE OF BIRTH:  [birth year]  DATE OF ADMISSION:  09/08/2014 DATE OF DISCHARGE:  09/09/2014  DISCHARGE DIAGNOSIS: Transient ischemic attack.  CONSULTATION: None.  PROCEDURES: None.   BRIEF HISTORY AND HOSPITAL COURSE:   DISCHARGE MEDICATIONS: Phenobarbital 32.4 mg 1 tablet p.o. once daily at bedtime, chondroitin sulfate with glucosamine 1 tablet p.o. once daily, chlorpheniramine with hydrocodone and 2.5 to 5 mL p.o. every 12 hours as needed for cough, Crestor 10 mg 1 tablet p.o. daily p.m., atenolol 50 mg 1 tablet p.o. 2 times a day as needed for pain, pantoprazole 40 mg 1 tablet p.o. 2 times a day, metoprolol succinate 50 mg 1 tablet p.o. once daily in a.m., cyanocobalamin 1000 mcg per mL, 1 mL injectable once a month, glipizide with metformin 5/500 one tablet p.o. 2 times a day, furosemide 20 mg 1 tablet p.o. once daily in the a.m. as needed for edema, benazepril 10 mg 1 tablet p.o. once daily in a.m., aspirin 325 mg p.o. once daily, multivitamin with minerals 1 tablet p.o. once daily, pioglitazone 15 mg 1 tablet p.o. daily in a.m., Tylenol 500 mg 2 tablets p.o. every 6 hours as needed for pain, iron sulfate 325 mg 1 tablet p.o. once daily.   Discharged home with home health services for physical therapy.   DIET: Low-sodium, low-fat, carb-controlled.   ACTIVITY: As tolerated, as recommended by physical therapy.  FOLLOWUP: With primary care physician in 1 week and neurology in 2 weeks. PCP and neurology to obtain carotid Dopplers and 2D echocardiogram, if there done at [facility].  ____________________________ Nicholes Mango, MD ag:ap D: 09/13/2014 23:38:27 ET T: 09/14/2014 09:38:51 ET JOB#: [id number]  cc: Nicholes Mango, MD, <Dictator> Nicholes Mango MD ELECTRONICALLY SIGNED 09/25/2014 22:31

## 2014-11-05 NOTE — H&P (Signed)
PATIENT NAME:  Timothy [name], Timothy [name] MR#:  [id number] DATE OF BIRTH:  05/27/1929  DATE OF ADMISSION:  09/08/2014  PRIMARY CARE PHYSICIAN: Guadalupe Maple, MD  PRIMARY CARDIOLOGY: [name], MD  CHIEF COMPLAINT: Blurry vision.  HISTORY OF PRESENT ILLNESS: The patient is an 81 year old Caucasian male brought into the ED with a chief complaint of blurry vision. The patient is reporting that he had blurry vision for 30 minutes this morning which has spontaneously resolved. The patient had a previous history of TIAs and had concern about another TIA versus CVA, and the patient is brought into the ED. Initial CT head is normal. The patient reports that he was recently seen by an ophthalmologist and all his eye check-up was normal and also reporting that his diabetes is under good control. Daughter is at bedside. The patient denies any chest pain or pain or shortness of breath. No other complaints.   PAST MEDICAL HISTORY: Hypertension, diabetes mellitus, history of TIA, chronic low back pain, anxiety and GERD.  PAST SURGICAL HISTORY: Bilateral knee surgeries, back surgery,  Appendectomy, cholecystectomy, tonsillectomy, hernia repair.  ALLERGIES: No known drug allergies.   PSYCHOSOCIAL HISTORY: Lives at home. Denies any history of smoking, alcohol or illicit drug use.   FAMILY HISTORY: Positive for cancer and diabetes.   REVIEW OF SYSTEMS: CONSTITUTIONAL: Denies any fever, fatigue, weakness, weight loss or weight gain.  EYES: Denies blurry vision, double vision, glaucoma.  ENT: Denies epistaxis, discharge.  RESPIRATION: Denies cough, COPD, no hemoptysis, wheezing.  CARDIOVASCULAR: No chest pain, palpitations  GASTROINTESTINAL: Denies nausea, vomiting, diarrhea, abdominal pain.  GENITOURINARY: No dysuria or hematuria.  ENDOCRINE: Denies polyuria, nocturia. HEMATOLOGIC/LYMPHATIC: No anemia, easy bruising, bleeding.  INTEGUMENTARY: No acne, rash, lesions.  MUSCULOSKELETAL: No joint pain in the  neck. Has chronic low back pain. NEUROLOGIC:  History of TIAs present. PSYCHIATRIC: No anxiety or depression.   HOME MEDICATIONS: Tylenol 500 mg 2 tablets p.o. q.6 h. as needed, tramadol 50 mg 1 tablet p.o. 2 times a day p.o., pioglitazone 15 mg 1 tablet p.o. once daily, phenobarbital 1 tablet p.o. once daily, pantoprazole 40 mg p.o. 2 times a day, multivitamin 1 tablet p.o. once daily, metoprolol succinate 50 mg 1 tablet p.o. once a day, levofloxacin 750 mg, glipizide with metformin 2 times a day, furosemide 20 mg p.o. once daily, cyanocobalamin 1000 mcg/mL 1 mL injection once monthly, Crestor 10 mg p.o. once daily, benazepril 10 mg p.o. once daily, aspirin 325 mg p.o. once daily.   PHYSICAL EXAMINATION: VITAL SIGNS: Temperature 98.4, pulse 66, respirations 18, blood pressure 125/66, pulse oximetry 98%.  GENERAL APPEARANCE: Not in acute distress. Moderately built and nourished.  HEENT: Normocephalic, atraumatic. Pupils are equally reacting to light and accommodation. No scleral icterus. No conjunctival injection. No sinus tenderness. No postnasal drip. Moist mucous membranes. NECK: Supple. No JVD or thyromegaly. Range of motion is intact.  LUNGS: Clear to auscultation bilaterally. No accessory muscle use and no anterior chest wall tenderness on palpation.  CARDIAC: S1, S2 normal. Regular rate and rhythm. No murmur.  GASTROINTESTINAL: Soft. Bowel sounds are positive in all 4 quadrants. Nontender, nondistended. No masses.  NEUROLOGIC: Awake, alert, oriented x3. Cranial nerves II through XII are grossly intact. Motor and sensory are intact. Reflexes are 2+.  EXTREMITIES: No edema. No cyanosis. No clubbing.  SKIN: Warm to touch. Normal turgor. No rashes. No lesions.  MUSCULOSKELETAL: No joint effusion, tenderness, or edema.  PSYCHIATRIC: Normal mood and affect.   LABORATORY AND IMAGING STUDIES: CAT scan  of the head without contrast: No acute intracranial abnormalities, atrophy with small vessel  chronic ischemic changes of the cerebral white matter. Portable chest x-ray: Cardiomegaly and low lung volumes without acute disease.  EKG: Normal sinus rhythm and left axis deviation. LFTs are normal except albumin at 3.2. WBC 8.3, hemoglobin 11.1, hematocrit 34.8, platelets 183,000. PT-INR normal. Urinalysis: Yellow in color, clear in appearance, ketones negative, glucose 150 mg/dL, nitrites and leukocyte esterase are negative. BMP: Glucose 158. Anion gap 4. Serum osmolality 276, calcium 8.4. The rest of the BMP is normal.   ASSESSMENT AND PLAN: An 81 year old Caucasian male brought into the ED after he had an episode of blurry vision for 30 minutes in the morning, which has completely resolved at this point. Will admit him with the following assessment and plan:  Blurry vision for 30 minutes probably from transient ischemic attack versus worsening of diabetes mellitus. Will admit him to a telemetry bed under observation status. Will obtain MRA of the brain. Carotid Dopplers and 2-D echocardiogram was initially ordered, but they were canceled, as the patient is reporting that he just recently had carotid Dopplers and echocardiogram at [facility], so will request medical records from [facility]. Will continue aspirin and statin. This can be worsening of diabetes mellitus also, so will check hemoglobin A1c, but the patient is reporting that his recent hemoglobin A1c was normal. Will get neurologic checks. If necessary, will put in a neurology consult. At this point, the patient does not have any symptoms, so I do not think he needs PT consult at this point. Will monitor closely. Diabetes mellitus. Will hold his p.o. medications and put him on sliding scale insulin.  Hypertension. Will continue his home medications, benazepril, metoprolol and up-titrate on an as-needed.  Chronic low back pain. Continue tramadol which is his home medication.  History of chronic anxiety. Continue benazepril, which is his  home medication.  Gastroesophageal reflux disease. Will provide him gastrointestinal prophylaxis with pantoprazole and deep vein thrombosis prophylaxis with Lovenox subcutaneously. He is a full code.   TOTAL TIME SPENT ON THE ADMISSION: 43 minutes.   Plan of care discussed with the patient and his daughter at bedside. They both verbalized understanding of the plan.     ____________________________ Nicholes Mango, MD ag:lm D: 09/08/2014 18:59:13 ET T: 09/08/2014 21:40:57 ET JOB#: [id number]  cc: Nicholes Mango, MD, <Dictator> Guadalupe Maple, MD Nicholes Mango MD ELECTRONICALLY SIGNED 09/25/2014 22:29

## 2014-11-05 NOTE — Discharge Summary (Signed)
PATIENT NAME:  Timothy [name], CLOCK MR#:  [id number] DATE OF BIRTH:  [birth year]  DATE OF ADMISSION:  09/08/2014 DATE OF DISCHARGE:  09/09/2014  DISCHARGE DIAGNOSIS: Transient ischemic attack.   CONSULTATIONS: None.   PROCEDURES: None.   MEDICATIONS: At the time of discharge, phenobarbital 32.4 mg 1 tablet p.o. once daily at bedtime, chondroitin sulfate with glucosamine 400/500 one tablet p.o. once daily, chlorpheniramine hydrocodone 2.5 to 5 mg p.o. every 12 hours as needed for cough, Crestor 10 mg 1 tablet p.o. once daily, tramadol 50 mg 1 tablet 2 times a day as needed for pain, pantoprazole 40 mg 1 tablet p.o. 2 times a day, metoprolol succinate 50 mg 1 tablet p.o. once daily in the a.m., cyanocobalamin 1000 mcg in 1 mL inject 1 mL once a month, glipizide with metformin 5/500 one tablet p.o. 2 times a day, furosemide 20 mg 1 tablet p.o. once daily as needed for edema, benazepril 10 mg 1 tablet p.o. once daily in the a.m., aspirin 325 mg once daily, multivitamin with minerals 1 tablet p.o. once daily, pioglitazone 50 mg 1 tablet p.o. once daily, Tylenol 500 mg 2 tablets every 6 hours as needed for pain, iron sulfate 325 mg 1 tablet p.o. once daily.   Discharged home with home health services for physical therapy. No need of home oxygen.   DIET: Low fat, low-sodium, carbohydrate-controlled.   ACTIVITY: As tolerated, as recommended by physical therapy.   FOLLOW-UP: With primary care physician in 1 week, and then neurology in 1 to 2 weeks. PCP and neurology to obtain reports of the carotid Dopplers and 2-D echocardiogram from [facility] as reported by the patient. If they were not done, these studies need to be considered to be done as an outpatient.   BRIEF HISTORY AND HOSPITAL COURSE BASED ON PROBLEMS: The patient is an 81 year old, Caucasian male, brought into the ED with a chief complaint of blurry vision. The patient reported that the blurry vision lasted for approximately 30 minutes in the  morning and spontaneously resolved. He had previous history of TIA and was worried about a mini-stroke or a CVA. Please review my history and physical dictated. The patient also reported that he was recently seen by ophthalmology and his eye check-up was normal.   For TIA, the patient was admitted to the hospital under observation status. MRA of the brain, carotid Dopplers and 2-D echocardiogram were initially ordered, but the patient has reported that he had echocardiogram and carotid Dopplers done within the last month, and as per his report, they were normal studies. Having said that, medical records were requested from [facility] and those 2 studies were canceled. MRA of the brain was ordered. The patient was given aspirin and statin. No dysphagia or dysarthria was noticed. PT consult was placed.   The patient remained asymptomatic during the hospital course. PT has recommended home with home physical therapy. The patient was symptom-free during the hospital course. MRA of the brain was done, this study was done on March 5, has revealed no acute intracranial abnormality, mild chronic small vessel ischemic disease and moderate cerebral atrophy.   As the patient was asymptomatic and CVA was ruled out, the patient was discharged home in stable condition. PCP to consider obtaining medical records including carotid Dopplers and 2-D echocardiogram from [facility]. The patient is to continue his home medications regarding his diabetes and hypertension.   PHYSICAL EXAMINATION: VITAL SIGNS: On March 5, temperature 97.9, pulse 71, respirations 20, blood pressure 114/54, pulse  oximetry at 94% at rest.  GENERAL APPEARANCE: Not in any acute distress. Moderately built and nourished.  HEENT: Normocephalic, atraumatic. Pupils are reacting equally to light and accommodation. No scleral icterus. No conjunctival injection. No sinus tenderness. Moist mucous membranes. Somewhat mask-like face  NECK: Supple. No JVD.  No thyromegaly. Range of motion is intact.  LUNGS: Clear to auscultation bilaterally. No accessory muscle use and no anterior chest wall tenderness on palpation.  CARDIAC: S1, S2 normal. Regular rate and rhythm. No murmurs.  GASTROINTESTINAL: Soft. Bowel sounds are positive in all 4 quadrants. Nontender, nondistended. No masses.  NEUROLOGIC: Awake, alert and oriented x 2 to 3. Following verbal commands. Motor and sensory are intact.  SKIN: Warm and dry. Normal turgor, no rashes, no lesions.  MUSCULOSKELETAL: No joint pain, no back pain or degenerative joint disease  LABORATORIES AND IMAGING STUDIES: On March 5, the patient's magnesium was 2.0, LDL 27, VLDL 23, hemoglobin A1c 7.6, total cholesterol 94, HDL 34. TSH is normal. WBC 7.4, hemoglobin 10.3, hematocrit 34.7, platelets are 157,000.   Urinalysis was negative, ketones negative, bilirubin negative, nitrites and leukocyte esterase are negative.    For diabetes mellitus and hypertension, the patient is to continue his home medications. PCP to update as needed.   The diagnosis and plan of care was discussed in detail with the patient and his daughter at the bedside. They both verbalized understanding of the plan. All questions were answered.   The patient was discharged home in stable condition.   TOTAL TIME SPENT ON DISCHARGE AND COORDINATION AND CARE: 45 minutes.    ____________________________ Nicholes Mango, MD ag:JT D: 09/13/2014 23:48:01 ET T: 09/14/2014 10:54:01 ET JOB#: [id number]  cc: Nicholes Mango, MD, <Dictator> Nicholes Mango MD ELECTRONICALLY SIGNED 09/25/2014 22:32

## 2014-11-06 DIAGNOSIS — I350 Nonrheumatic aortic (valve) stenosis: Secondary | ICD-10-CM | POA: Diagnosis not present

## 2014-11-06 DIAGNOSIS — Z01818 Encounter for other preprocedural examination: Secondary | ICD-10-CM | POA: Diagnosis not present

## 2014-11-06 DIAGNOSIS — I10 Essential (primary) hypertension: Secondary | ICD-10-CM | POA: Diagnosis not present

## 2014-11-06 DIAGNOSIS — R251 Tremor, unspecified: Secondary | ICD-10-CM | POA: Diagnosis not present

## 2014-11-06 DIAGNOSIS — M4806 Spinal stenosis, lumbar region: Secondary | ICD-10-CM | POA: Diagnosis not present

## 2014-11-06 DIAGNOSIS — E119 Type 2 diabetes mellitus without complications: Secondary | ICD-10-CM | POA: Diagnosis not present

## 2014-11-06 DIAGNOSIS — D649 Anemia, unspecified: Secondary | ICD-10-CM | POA: Diagnosis not present

## 2014-11-06 DIAGNOSIS — K219 Gastro-esophageal reflux disease without esophagitis: Secondary | ICD-10-CM | POA: Diagnosis not present

## 2014-11-17 DIAGNOSIS — J069 Acute upper respiratory infection, unspecified: Secondary | ICD-10-CM | POA: Diagnosis not present

## 2014-11-25 ENCOUNTER — Other Ambulatory Visit: Payer: Self-pay | Admitting: Cardiovascular Disease

## 2014-12-19 DIAGNOSIS — I6523 Occlusion and stenosis of bilateral carotid arteries: Secondary | ICD-10-CM | POA: Diagnosis not present

## 2014-12-19 DIAGNOSIS — G458 Other transient cerebral ischemic attacks and related syndromes: Secondary | ICD-10-CM | POA: Diagnosis not present

## 2014-12-22 DIAGNOSIS — E785 Hyperlipidemia, unspecified: Secondary | ICD-10-CM | POA: Diagnosis not present

## 2014-12-22 DIAGNOSIS — R2689 Other abnormalities of gait and mobility: Secondary | ICD-10-CM | POA: Diagnosis not present

## 2014-12-22 DIAGNOSIS — J9811 Atelectasis: Secondary | ICD-10-CM | POA: Diagnosis not present

## 2014-12-22 DIAGNOSIS — I714 Abdominal aortic aneurysm, without rupture: Secondary | ICD-10-CM | POA: Diagnosis not present

## 2014-12-22 DIAGNOSIS — E876 Hypokalemia: Secondary | ICD-10-CM | POA: Diagnosis not present

## 2014-12-22 DIAGNOSIS — R0602 Shortness of breath: Secondary | ICD-10-CM | POA: Diagnosis not present

## 2014-12-22 DIAGNOSIS — E538 Deficiency of other specified B group vitamins: Secondary | ICD-10-CM | POA: Diagnosis not present

## 2014-12-22 DIAGNOSIS — I739 Peripheral vascular disease, unspecified: Secondary | ICD-10-CM | POA: Diagnosis not present

## 2014-12-22 DIAGNOSIS — Z7902 Long term (current) use of antithrombotics/antiplatelets: Secondary | ICD-10-CM | POA: Diagnosis not present

## 2014-12-22 DIAGNOSIS — J811 Chronic pulmonary edema: Secondary | ICD-10-CM | POA: Diagnosis not present

## 2014-12-22 DIAGNOSIS — Z981 Arthrodesis status: Secondary | ICD-10-CM | POA: Diagnosis not present

## 2014-12-22 DIAGNOSIS — J9601 Acute respiratory failure with hypoxia: Secondary | ICD-10-CM | POA: Diagnosis not present

## 2014-12-22 DIAGNOSIS — Z9181 History of falling: Secondary | ICD-10-CM | POA: Diagnosis not present

## 2014-12-22 DIAGNOSIS — M4806 Spinal stenosis, lumbar region: Secondary | ICD-10-CM | POA: Diagnosis not present

## 2014-12-22 DIAGNOSIS — Z7982 Long term (current) use of aspirin: Secondary | ICD-10-CM | POA: Diagnosis not present

## 2014-12-22 DIAGNOSIS — K219 Gastro-esophageal reflux disease without esophagitis: Secondary | ICD-10-CM | POA: Diagnosis not present

## 2014-12-22 DIAGNOSIS — M6250 Muscle wasting and atrophy, not elsewhere classified, unspecified site: Secondary | ICD-10-CM | POA: Diagnosis not present

## 2014-12-22 DIAGNOSIS — M6281 Muscle weakness (generalized): Secondary | ICD-10-CM | POA: Diagnosis not present

## 2014-12-22 DIAGNOSIS — M47816 Spondylosis without myelopathy or radiculopathy, lumbar region: Secondary | ICD-10-CM | POA: Diagnosis not present

## 2014-12-22 DIAGNOSIS — I350 Nonrheumatic aortic (valve) stenosis: Secondary | ICD-10-CM | POA: Diagnosis not present

## 2014-12-22 DIAGNOSIS — Z85828 Personal history of other malignant neoplasm of skin: Secondary | ICD-10-CM | POA: Diagnosis not present

## 2014-12-22 DIAGNOSIS — R197 Diarrhea, unspecified: Secondary | ICD-10-CM | POA: Diagnosis not present

## 2014-12-22 DIAGNOSIS — D649 Anemia, unspecified: Secondary | ICD-10-CM | POA: Diagnosis not present

## 2014-12-22 DIAGNOSIS — Q762 Congenital spondylolisthesis: Secondary | ICD-10-CM | POA: Diagnosis not present

## 2014-12-22 DIAGNOSIS — R05 Cough: Secondary | ICD-10-CM | POA: Diagnosis not present

## 2014-12-22 DIAGNOSIS — F05 Delirium due to known physiological condition: Secondary | ICD-10-CM | POA: Diagnosis not present

## 2014-12-22 DIAGNOSIS — R918 Other nonspecific abnormal finding of lung field: Secondary | ICD-10-CM | POA: Diagnosis not present

## 2014-12-22 DIAGNOSIS — G8929 Other chronic pain: Secondary | ICD-10-CM | POA: Diagnosis not present

## 2014-12-22 DIAGNOSIS — M4316 Spondylolisthesis, lumbar region: Secondary | ICD-10-CM | POA: Diagnosis not present

## 2014-12-22 DIAGNOSIS — F419 Anxiety disorder, unspecified: Secondary | ICD-10-CM | POA: Diagnosis present

## 2014-12-22 DIAGNOSIS — R4189 Other symptoms and signs involving cognitive functions and awareness: Secondary | ICD-10-CM | POA: Diagnosis not present

## 2014-12-22 DIAGNOSIS — G8918 Other acute postprocedural pain: Secondary | ICD-10-CM | POA: Diagnosis not present

## 2014-12-22 DIAGNOSIS — R109 Unspecified abdominal pain: Secondary | ICD-10-CM | POA: Diagnosis not present

## 2014-12-22 DIAGNOSIS — M47896 Other spondylosis, lumbar region: Secondary | ICD-10-CM | POA: Diagnosis not present

## 2014-12-22 DIAGNOSIS — Z7189 Other specified counseling: Secondary | ICD-10-CM | POA: Diagnosis not present

## 2014-12-22 DIAGNOSIS — R630 Anorexia: Secondary | ICD-10-CM | POA: Diagnosis not present

## 2014-12-22 DIAGNOSIS — I5031 Acute diastolic (congestive) heart failure: Secondary | ICD-10-CM | POA: Diagnosis not present

## 2014-12-22 DIAGNOSIS — K567 Ileus, unspecified: Secondary | ICD-10-CM | POA: Diagnosis not present

## 2014-12-22 DIAGNOSIS — I517 Cardiomegaly: Secondary | ICD-10-CM | POA: Diagnosis not present

## 2014-12-22 DIAGNOSIS — R0902 Hypoxemia: Secondary | ICD-10-CM | POA: Diagnosis not present

## 2014-12-22 DIAGNOSIS — I10 Essential (primary) hypertension: Secondary | ICD-10-CM | POA: Diagnosis not present

## 2014-12-22 DIAGNOSIS — A419 Sepsis, unspecified organism: Secondary | ICD-10-CM | POA: Diagnosis not present

## 2014-12-22 DIAGNOSIS — M4800 Spinal stenosis, site unspecified: Secondary | ICD-10-CM | POA: Diagnosis not present

## 2014-12-22 DIAGNOSIS — I4891 Unspecified atrial fibrillation: Secondary | ICD-10-CM | POA: Diagnosis not present

## 2014-12-22 DIAGNOSIS — E119 Type 2 diabetes mellitus without complications: Secondary | ICD-10-CM | POA: Diagnosis present

## 2014-12-22 DIAGNOSIS — E118 Type 2 diabetes mellitus with unspecified complications: Secondary | ICD-10-CM | POA: Diagnosis not present

## 2014-12-22 DIAGNOSIS — I480 Paroxysmal atrial fibrillation: Secondary | ICD-10-CM | POA: Diagnosis not present

## 2014-12-22 DIAGNOSIS — R410 Disorientation, unspecified: Secondary | ICD-10-CM | POA: Diagnosis not present

## 2014-12-22 DIAGNOSIS — Z4889 Encounter for other specified surgical aftercare: Secondary | ICD-10-CM | POA: Diagnosis not present

## 2014-12-22 DIAGNOSIS — G4700 Insomnia, unspecified: Secondary | ICD-10-CM | POA: Diagnosis not present

## 2014-12-22 DIAGNOSIS — R4182 Altered mental status, unspecified: Secondary | ICD-10-CM | POA: Diagnosis not present

## 2014-12-22 DIAGNOSIS — E872 Acidosis: Secondary | ICD-10-CM | POA: Diagnosis not present

## 2014-12-22 DIAGNOSIS — R5381 Other malaise: Secondary | ICD-10-CM | POA: Diagnosis not present

## 2014-12-22 DIAGNOSIS — Z8673 Personal history of transient ischemic attack (TIA), and cerebral infarction without residual deficits: Secondary | ICD-10-CM | POA: Diagnosis not present

## 2014-12-29 ENCOUNTER — Encounter
Admission: RE | Admit: 2014-12-29 | Discharge: 2014-12-29 | Disposition: A | Discharge status: Home / Self Care | Payer: Medicare Other | Source: Ambulatory Visit | Attending: Internal Medicine | Admitting: Internal Medicine

## 2015-01-04 DIAGNOSIS — M4800 Spinal stenosis, site unspecified: Secondary | ICD-10-CM | POA: Diagnosis not present

## 2015-01-04 DIAGNOSIS — E785 Hyperlipidemia, unspecified: Secondary | ICD-10-CM | POA: Diagnosis not present

## 2015-01-04 DIAGNOSIS — Z7902 Long term (current) use of antithrombotics/antiplatelets: Secondary | ICD-10-CM | POA: Diagnosis not present

## 2015-01-04 DIAGNOSIS — R300 Dysuria: Secondary | ICD-10-CM | POA: Diagnosis not present

## 2015-01-04 DIAGNOSIS — M6281 Muscle weakness (generalized): Secondary | ICD-10-CM | POA: Diagnosis not present

## 2015-01-04 DIAGNOSIS — I5031 Acute diastolic (congestive) heart failure: Secondary | ICD-10-CM | POA: Diagnosis not present

## 2015-01-04 DIAGNOSIS — K219 Gastro-esophageal reflux disease without esophagitis: Secondary | ICD-10-CM | POA: Diagnosis not present

## 2015-01-04 DIAGNOSIS — Z7982 Long term (current) use of aspirin: Secondary | ICD-10-CM | POA: Diagnosis not present

## 2015-01-04 DIAGNOSIS — D649 Anemia, unspecified: Secondary | ICD-10-CM | POA: Diagnosis not present

## 2015-01-04 DIAGNOSIS — E538 Deficiency of other specified B group vitamins: Secondary | ICD-10-CM | POA: Diagnosis not present

## 2015-01-04 DIAGNOSIS — R2689 Other abnormalities of gait and mobility: Secondary | ICD-10-CM | POA: Diagnosis not present

## 2015-01-04 DIAGNOSIS — I4891 Unspecified atrial fibrillation: Secondary | ICD-10-CM | POA: Diagnosis not present

## 2015-01-04 DIAGNOSIS — R600 Localized edema: Secondary | ICD-10-CM | POA: Diagnosis not present

## 2015-01-04 DIAGNOSIS — E119 Type 2 diabetes mellitus without complications: Secondary | ICD-10-CM | POA: Diagnosis not present

## 2015-01-04 DIAGNOSIS — I350 Nonrheumatic aortic (valve) stenosis: Secondary | ICD-10-CM | POA: Diagnosis not present

## 2015-01-04 DIAGNOSIS — Z4889 Encounter for other specified surgical aftercare: Secondary | ICD-10-CM | POA: Diagnosis not present

## 2015-01-04 DIAGNOSIS — M6250 Muscle wasting and atrophy, not elsewhere classified, unspecified site: Secondary | ICD-10-CM | POA: Diagnosis not present

## 2015-01-04 DIAGNOSIS — Q762 Congenital spondylolisthesis: Secondary | ICD-10-CM | POA: Diagnosis not present

## 2015-01-04 DIAGNOSIS — I10 Essential (primary) hypertension: Secondary | ICD-10-CM | POA: Diagnosis not present

## 2015-01-04 DIAGNOSIS — G8929 Other chronic pain: Secondary | ICD-10-CM | POA: Diagnosis not present

## 2015-01-04 DIAGNOSIS — R410 Disorientation, unspecified: Secondary | ICD-10-CM | POA: Diagnosis not present

## 2015-01-04 DIAGNOSIS — M4806 Spinal stenosis, lumbar region: Secondary | ICD-10-CM | POA: Diagnosis not present

## 2015-01-04 DIAGNOSIS — I739 Peripheral vascular disease, unspecified: Secondary | ICD-10-CM | POA: Diagnosis not present

## 2015-01-05 ENCOUNTER — Encounter
Admission: RE | Admit: 2015-01-05 | Discharge: 2015-01-05 | Disposition: A | Discharge status: Home / Self Care | Payer: Medicare Other | Source: Ambulatory Visit | Attending: Internal Medicine | Admitting: Internal Medicine

## 2015-01-08 ENCOUNTER — Other Ambulatory Visit
Admission: RE | Admit: 2015-01-08 | Discharge: 2015-01-08 | Disposition: A | Discharge status: Home / Self Care | Payer: Medicare Other | Source: Skilled Nursing Facility | Attending: Internal Medicine | Admitting: Internal Medicine

## 2015-01-08 DIAGNOSIS — R300 Dysuria: Secondary | ICD-10-CM | POA: Insufficient documentation

## 2015-01-08 DIAGNOSIS — R410 Disorientation, unspecified: Secondary | ICD-10-CM | POA: Insufficient documentation

## 2015-01-08 LAB — URINALYSIS COMPLETE WITH MICROSCOPIC (ARMC ONLY)
Bacteria, UA: NONE SEEN
Bilirubin Urine: NEGATIVE
Glucose, UA: NEGATIVE mg/dL
Hgb urine dipstick: NEGATIVE
Ketones, ur: NEGATIVE mg/dL
Leukocytes, UA: NEGATIVE
Nitrite: NEGATIVE
Protein, ur: NEGATIVE mg/dL
RBC / HPF: NONE SEEN RBC/hpf (ref 0–5)
Specific Gravity, Urine: 1.005 (ref 1.005–1.030)
Squamous Epithelial / LPF: NONE SEEN
pH: 5 (ref 5.0–8.0)

## 2015-01-09 LAB — BASIC METABOLIC PANEL
Anion gap: 13 (ref 5–15)
BUN: 12 mg/dL (ref 6–20)
CO2: 27 mmol/L (ref 22–32)
Calcium: 8.5 mg/dL — ABNORMAL LOW (ref 8.9–10.3)
Chloride: 97 mmol/L — ABNORMAL LOW (ref 101–111)
Creatinine, Ser: 0.94 mg/dL (ref 0.61–1.24)
GFR calc Af Amer: >60 mL/min (ref >60)
GFR calc non Af Amer: >60 mL/min (ref >60)
Glucose, Bld: 125 mg/dL — ABNORMAL HIGH (ref 65–99)
Potassium: 3.6 mmol/L (ref 3.5–5.1)
Sodium: 137 mmol/L (ref 135–145)

## 2015-01-10 LAB — URINE CULTURE: Culture: NO GROWTH

## 2015-01-16 LAB — BASIC METABOLIC PANEL
Anion gap: 14 (ref 5–15)
BUN: 20 mg/dL (ref 6–20)
CO2: 22 mmol/L (ref 22–32)
Calcium: 8.8 mg/dL — ABNORMAL LOW (ref 8.9–10.3)
Chloride: 94 mmol/L — ABNORMAL LOW (ref 101–111)
Creatinine, Ser: 1.33 mg/dL — ABNORMAL HIGH (ref 0.61–1.24)
GFR calc Af Amer: 55 mL/min — ABNORMAL LOW (ref >60)
GFR calc non Af Amer: 48 mL/min — ABNORMAL LOW (ref >60)
Glucose, Bld: 121 mg/dL — ABNORMAL HIGH (ref 65–99)
Potassium: 4 mmol/L (ref 3.5–5.1)
Sodium: 130 mmol/L — ABNORMAL LOW (ref 135–145)

## 2015-01-19 ENCOUNTER — Other Ambulatory Visit
Admission: RE | Admit: 2015-01-19 | Discharge: 2015-01-19 | Disposition: A | Discharge status: Home / Self Care | Payer: Medicare Other | Source: Ambulatory Visit | Attending: Internal Medicine | Admitting: Internal Medicine

## 2015-01-19 DIAGNOSIS — R600 Localized edema: Secondary | ICD-10-CM | POA: Insufficient documentation

## 2015-01-19 LAB — BASIC METABOLIC PANEL
Anion gap: 11 (ref 5–15)
BUN: 20 mg/dL (ref 6–20)
CO2: 25 mmol/L (ref 22–32)
Calcium: 9 mg/dL (ref 8.9–10.3)
Chloride: 97 mmol/L — ABNORMAL LOW (ref 101–111)
Creatinine, Ser: 1.1 mg/dL (ref 0.61–1.24)
GFR calc Af Amer: >60 mL/min (ref >60)
GFR calc non Af Amer: >60 mL/min (ref >60)
Glucose, Bld: 140 mg/dL — ABNORMAL HIGH (ref 65–99)
Potassium: 4.1 mmol/L (ref 3.5–5.1)
Sodium: 133 mmol/L — ABNORMAL LOW (ref 135–145)

## 2015-01-23 LAB — BASIC METABOLIC PANEL
Anion gap: 12 (ref 5–15)
BUN: 26 mg/dL — ABNORMAL HIGH (ref 6–20)
CO2: 26 mmol/L (ref 22–32)
Calcium: 8.9 mg/dL (ref 8.9–10.3)
Chloride: 97 mmol/L — ABNORMAL LOW (ref 101–111)
Creatinine, Ser: 1.3 mg/dL — ABNORMAL HIGH (ref 0.61–1.24)
GFR calc Af Amer: 57 mL/min — ABNORMAL LOW (ref >60)
GFR calc non Af Amer: 49 mL/min — ABNORMAL LOW (ref >60)
Glucose, Bld: 137 mg/dL — ABNORMAL HIGH (ref 65–99)
Potassium: 4.2 mmol/L (ref 3.5–5.1)
Sodium: 135 mmol/L (ref 135–145)

## 2015-02-01 DIAGNOSIS — M4800 Spinal stenosis, site unspecified: Secondary | ICD-10-CM | POA: Diagnosis not present

## 2015-02-01 DIAGNOSIS — I4891 Unspecified atrial fibrillation: Secondary | ICD-10-CM | POA: Diagnosis not present

## 2015-02-03 DIAGNOSIS — I739 Peripheral vascular disease, unspecified: Secondary | ICD-10-CM | POA: Diagnosis not present

## 2015-02-03 DIAGNOSIS — E538 Deficiency of other specified B group vitamins: Secondary | ICD-10-CM | POA: Diagnosis not present

## 2015-02-03 DIAGNOSIS — D649 Anemia, unspecified: Secondary | ICD-10-CM | POA: Diagnosis not present

## 2015-02-03 DIAGNOSIS — I4891 Unspecified atrial fibrillation: Secondary | ICD-10-CM | POA: Diagnosis not present

## 2015-02-03 DIAGNOSIS — E669 Obesity, unspecified: Secondary | ICD-10-CM | POA: Diagnosis not present

## 2015-02-03 DIAGNOSIS — E119 Type 2 diabetes mellitus without complications: Secondary | ICD-10-CM | POA: Diagnosis not present

## 2015-02-03 DIAGNOSIS — Z8673 Personal history of transient ischemic attack (TIA), and cerebral infarction without residual deficits: Secondary | ICD-10-CM | POA: Diagnosis not present

## 2015-02-03 DIAGNOSIS — Z7901 Long term (current) use of anticoagulants: Secondary | ICD-10-CM | POA: Diagnosis not present

## 2015-02-03 DIAGNOSIS — Z4789 Encounter for other orthopedic aftercare: Secondary | ICD-10-CM | POA: Diagnosis not present

## 2015-02-03 DIAGNOSIS — I10 Essential (primary) hypertension: Secondary | ICD-10-CM | POA: Diagnosis not present

## 2015-02-05 DIAGNOSIS — D649 Anemia, unspecified: Secondary | ICD-10-CM | POA: Diagnosis not present

## 2015-02-05 DIAGNOSIS — I739 Peripheral vascular disease, unspecified: Secondary | ICD-10-CM | POA: Diagnosis not present

## 2015-02-05 DIAGNOSIS — Z4789 Encounter for other orthopedic aftercare: Secondary | ICD-10-CM | POA: Diagnosis not present

## 2015-02-05 DIAGNOSIS — E119 Type 2 diabetes mellitus without complications: Secondary | ICD-10-CM | POA: Diagnosis not present

## 2015-02-05 DIAGNOSIS — I4891 Unspecified atrial fibrillation: Secondary | ICD-10-CM | POA: Diagnosis not present

## 2015-02-05 DIAGNOSIS — I10 Essential (primary) hypertension: Secondary | ICD-10-CM | POA: Diagnosis not present

## 2015-02-07 ENCOUNTER — Telehealth: Payer: Self-pay

## 2015-02-07 DIAGNOSIS — I739 Peripheral vascular disease, unspecified: Secondary | ICD-10-CM | POA: Diagnosis not present

## 2015-02-07 DIAGNOSIS — I10 Essential (primary) hypertension: Secondary | ICD-10-CM | POA: Diagnosis not present

## 2015-02-07 DIAGNOSIS — Z4789 Encounter for other orthopedic aftercare: Secondary | ICD-10-CM | POA: Diagnosis not present

## 2015-02-07 DIAGNOSIS — E119 Type 2 diabetes mellitus without complications: Secondary | ICD-10-CM | POA: Diagnosis not present

## 2015-02-07 DIAGNOSIS — D649 Anemia, unspecified: Secondary | ICD-10-CM | POA: Diagnosis not present

## 2015-02-07 DIAGNOSIS — I4891 Unspecified atrial fibrillation: Secondary | ICD-10-CM | POA: Diagnosis not present

## 2015-02-07 NOTE — Telephone Encounter (Signed)
Patient told the pharmacy that Pioglitazone was not on his discharge instructions. Should he continue taking it?

## 2015-02-07 NOTE — Telephone Encounter (Signed)
I spoke with the pharmacy and advised them that he should continue it. The pharmacy will let the patient know.

## 2015-02-07 NOTE — Telephone Encounter (Signed)
Yes, it is still on his medication list

## 2015-02-09 DIAGNOSIS — I739 Peripheral vascular disease, unspecified: Secondary | ICD-10-CM | POA: Diagnosis not present

## 2015-02-09 DIAGNOSIS — D649 Anemia, unspecified: Secondary | ICD-10-CM | POA: Diagnosis not present

## 2015-02-09 DIAGNOSIS — I10 Essential (primary) hypertension: Secondary | ICD-10-CM | POA: Diagnosis not present

## 2015-02-09 DIAGNOSIS — Z4789 Encounter for other orthopedic aftercare: Secondary | ICD-10-CM | POA: Diagnosis not present

## 2015-02-09 DIAGNOSIS — E119 Type 2 diabetes mellitus without complications: Secondary | ICD-10-CM | POA: Diagnosis not present

## 2015-02-09 DIAGNOSIS — I4891 Unspecified atrial fibrillation: Secondary | ICD-10-CM | POA: Diagnosis not present

## 2015-02-12 DIAGNOSIS — Z4789 Encounter for other orthopedic aftercare: Secondary | ICD-10-CM | POA: Diagnosis not present

## 2015-02-12 DIAGNOSIS — I739 Peripheral vascular disease, unspecified: Secondary | ICD-10-CM | POA: Diagnosis not present

## 2015-02-12 DIAGNOSIS — D649 Anemia, unspecified: Secondary | ICD-10-CM | POA: Diagnosis not present

## 2015-02-12 DIAGNOSIS — I10 Essential (primary) hypertension: Secondary | ICD-10-CM | POA: Diagnosis not present

## 2015-02-12 DIAGNOSIS — I4891 Unspecified atrial fibrillation: Secondary | ICD-10-CM | POA: Diagnosis not present

## 2015-02-12 DIAGNOSIS — E119 Type 2 diabetes mellitus without complications: Secondary | ICD-10-CM | POA: Diagnosis not present

## 2015-02-13 DIAGNOSIS — E119 Type 2 diabetes mellitus without complications: Secondary | ICD-10-CM | POA: Diagnosis not present

## 2015-02-13 DIAGNOSIS — I10 Essential (primary) hypertension: Secondary | ICD-10-CM | POA: Diagnosis not present

## 2015-02-13 DIAGNOSIS — I4891 Unspecified atrial fibrillation: Secondary | ICD-10-CM | POA: Diagnosis not present

## 2015-02-13 DIAGNOSIS — I739 Peripheral vascular disease, unspecified: Secondary | ICD-10-CM | POA: Diagnosis not present

## 2015-02-13 DIAGNOSIS — D649 Anemia, unspecified: Secondary | ICD-10-CM | POA: Diagnosis not present

## 2015-02-13 DIAGNOSIS — Z4789 Encounter for other orthopedic aftercare: Secondary | ICD-10-CM | POA: Diagnosis not present

## 2015-02-14 ENCOUNTER — Other Ambulatory Visit: Payer: Self-pay

## 2015-02-14 ENCOUNTER — Encounter: Payer: Self-pay | Admitting: Emergency Medicine

## 2015-02-14 ENCOUNTER — Emergency Department
Admission: EM | Admit: 2015-02-14 | Discharge: 2015-02-14 | Disposition: A | Discharge status: Home / Self Care | Payer: Medicare Other | Attending: Emergency Medicine | Admitting: Emergency Medicine

## 2015-02-14 ENCOUNTER — Telehealth: Payer: Self-pay | Admitting: Cardiovascular Disease

## 2015-02-14 DIAGNOSIS — E119 Type 2 diabetes mellitus without complications: Secondary | ICD-10-CM | POA: Insufficient documentation

## 2015-02-14 DIAGNOSIS — Z79899 Other long term (current) drug therapy: Secondary | ICD-10-CM | POA: Diagnosis not present

## 2015-02-14 DIAGNOSIS — I4891 Unspecified atrial fibrillation: Secondary | ICD-10-CM

## 2015-02-14 DIAGNOSIS — Z79891 Long term (current) use of opiate analgesic: Secondary | ICD-10-CM | POA: Diagnosis not present

## 2015-02-14 DIAGNOSIS — G8918 Other acute postprocedural pain: Secondary | ICD-10-CM | POA: Insufficient documentation

## 2015-02-14 DIAGNOSIS — Z4789 Encounter for other orthopedic aftercare: Secondary | ICD-10-CM | POA: Diagnosis not present

## 2015-02-14 DIAGNOSIS — Z7982 Long term (current) use of aspirin: Secondary | ICD-10-CM | POA: Diagnosis not present

## 2015-02-14 DIAGNOSIS — M549 Dorsalgia, unspecified: Secondary | ICD-10-CM | POA: Diagnosis not present

## 2015-02-14 DIAGNOSIS — R000 Tachycardia, unspecified: Secondary | ICD-10-CM | POA: Diagnosis present

## 2015-02-14 DIAGNOSIS — I739 Peripheral vascular disease, unspecified: Secondary | ICD-10-CM | POA: Diagnosis not present

## 2015-02-14 DIAGNOSIS — R350 Frequency of micturition: Secondary | ICD-10-CM | POA: Insufficient documentation

## 2015-02-14 DIAGNOSIS — I10 Essential (primary) hypertension: Secondary | ICD-10-CM | POA: Diagnosis not present

## 2015-02-14 DIAGNOSIS — D649 Anemia, unspecified: Secondary | ICD-10-CM | POA: Diagnosis not present

## 2015-02-14 LAB — BASIC METABOLIC PANEL
Anion gap: 11 (ref 5–15)
BUN: 27 mg/dL — ABNORMAL HIGH (ref 6–20)
CO2: 24 mmol/L (ref 22–32)
Calcium: 9.2 mg/dL (ref 8.9–10.3)
Chloride: 99 mmol/L — ABNORMAL LOW (ref 101–111)
Creatinine, Ser: 1.32 mg/dL — ABNORMAL HIGH (ref 0.61–1.24)
GFR calc Af Amer: 56 mL/min — ABNORMAL LOW (ref >60)
GFR calc non Af Amer: 48 mL/min — ABNORMAL LOW (ref >60)
Glucose, Bld: 204 mg/dL — ABNORMAL HIGH (ref 65–99)
Potassium: 3.9 mmol/L (ref 3.5–5.1)
Sodium: 134 mmol/L — ABNORMAL LOW (ref 135–145)

## 2015-02-14 LAB — URINALYSIS COMPLETE WITH MICROSCOPIC (ARMC ONLY)
Bacteria, UA: NONE SEEN
Bilirubin Urine: NEGATIVE
Glucose, UA: NEGATIVE mg/dL
Hgb urine dipstick: NEGATIVE
Ketones, ur: NEGATIVE mg/dL
Leukocytes, UA: NEGATIVE
Nitrite: NEGATIVE
Protein, ur: NEGATIVE mg/dL
RBC / HPF: NONE SEEN RBC/hpf (ref 0–5)
Specific Gravity, Urine: 1.006 (ref 1.005–1.030)
Squamous Epithelial / LPF: NONE SEEN
pH: 5 (ref 5.0–8.0)

## 2015-02-14 LAB — CBC WITH DIFFERENTIAL/PLATELET
Basophils Absolute: 0.1 10*3/uL (ref 0–0.1)
Basophils Relative: 1 %
Eosinophils Absolute: 0.2 10*3/uL (ref 0–0.7)
Eosinophils Relative: 3 %
HCT: 34.6 % — ABNORMAL LOW (ref 40.0–52.0)
Hemoglobin: 11.3 g/dL — ABNORMAL LOW (ref 13.0–18.0)
Lymphocytes Relative: 26 %
Lymphs Abs: 2.2 10*3/uL (ref 1.0–3.6)
MCH: 29.1 pg (ref 26.0–34.0)
MCHC: 32.6 g/dL (ref 32.0–36.0)
MCV: 89.2 fL (ref 80.0–100.0)
Monocytes Absolute: 1 10*3/uL (ref 0.2–1.0)
Monocytes Relative: 12 %
Neutro Abs: 4.8 10*3/uL (ref 1.4–6.5)
Neutrophils Relative %: 58 %
Platelets: 245 10*3/uL (ref 150–440)
RBC: 3.88 MIL/uL — ABNORMAL LOW (ref 4.40–5.90)
RDW: 14.8 % — ABNORMAL HIGH (ref 11.5–14.5)
WBC: 8.3 10*3/uL (ref 3.8–10.6)

## 2015-02-14 LAB — TROPONIN I: Troponin I: <0.03 ng/mL (ref <0.031)

## 2015-02-14 MED ORDER — DIGOXIN 0.25 MG/ML IJ SOLN
0.2500 mg | Freq: Once | INTRAMUSCULAR | Status: AC
  Administered 2015-02-14: 0.25 mg via INTRAVENOUS
  Filled 2015-02-14: qty 2
  Filled 2015-02-14: qty 3
  Filled 2015-02-14: qty 2

## 2015-02-14 MED ORDER — ADENOSINE 12 MG/4ML IV SOLN
INTRAVENOUS | Status: AC
  Filled 2015-02-14: qty 4

## 2015-02-14 MED ORDER — BENZONATATE 100 MG PO CAPS
100.0000 mg | ORAL_CAPSULE | Freq: Once | ORAL | Status: AC
  Administered 2015-02-14: 100 mg via ORAL
  Filled 2015-02-14: qty 1

## 2015-02-14 MED ORDER — DIGOXIN 125 MCG PO TABS: 125.0000 ug | ORAL_TABLET | Freq: Every day | ORAL | Status: DC

## 2015-02-14 NOTE — Discharge Instructions (Signed)
You were found to have a supraventricular tachycardia in the emergency department upon arrival, which then converted into atrial fibrillation. You are being started on a new medication called to digoxin to help with keeping the rate slow enough in the irregular heartbeat called atrial fibrillation. I spoke with Dr. Pamala Hurry in, whose office will call you for appointment this week. You can discuss with him changing your blood thinner.  Return to the emergency room for any worsening condition including fast heart rate, palpitations, chest pain, trouble breathing, shortness breath, altered mental status, dizziness or passing out, or any other symptoms concerning to you.    Atrial Fibrillation Atrial fibrillation is a type of irregular heart rhythm (arrhythmia). During atrial fibrillation, the upper chambers of the heart (atria) quiver continuously in a chaotic pattern. This causes an irregular and often rapid heart rate.  Atrial fibrillation is the result of the heart becoming overloaded with disorganized signals that tell it to beat. These signals are normally released one at a time by a part of the right atrium called the sinoatrial node. They then travel from the atria to the lower chambers of the heart (ventricles), causing the atria and ventricles to contract and pump blood as they pass. In atrial fibrillation, parts of the atria outside of the sinoatrial node also release these signals. This results in two problems. First, the atria receive so many signals that they do not have time to fully contract. Second, the ventricles, which can only receive one signal at a time, beat irregularly and out of rhythm with the atria.  There are three types of atrial fibrillation:   Paroxysmal. Paroxysmal atrial fibrillation starts suddenly and stops on its own within a week.  Persistent. Persistent atrial fibrillation lasts for more than a week. It may stop on its own or with treatment.  Permanent. Permanent atrial  fibrillation does not go away. Episodes of atrial fibrillation may lead to permanent atrial fibrillation. Atrial fibrillation can prevent your heart from pumping blood normally. It increases your risk of stroke and can lead to heart failure.  CAUSES   Heart conditions, including a heart attack, heart failure, coronary artery disease, and heart valve conditions.   Inflammation of the sac that surrounds the heart (pericarditis).  Blockage of an artery in the lungs (pulmonary embolism).  Pneumonia or other infections.  Chronic lung disease.  Thyroid problems, especially if the thyroid is overactive (hyperthyroidism).  Caffeine, excessive alcohol use, and use of some illegal drugs.   Use of some medicines, including certain decongestants and diet pills.  Heart surgery.   Birth defects.  Sometimes, no cause can be found. When this happens, the atrial fibrillation is called lone atrial fibrillation. The risk of complications from atrial fibrillation increases if you have lone atrial fibrillation and you are age 54 years or older. RISK FACTORS  Heart failure.  Coronary artery disease.  Diabetes mellitus.   High blood pressure (hypertension).   Obesity.   Other arrhythmias.   Increased age. SIGNS AND SYMPTOMS   A feeling that your heart is beating rapidly or irregularly.   A feeling of discomfort or pain in your chest.   Shortness of breath.   Sudden light-headedness or weakness.   Getting tired easily when exercising.   Urinating more often than normal (mainly when atrial fibrillation first begins).  In paroxysmal atrial fibrillation, symptoms may start and suddenly stop. DIAGNOSIS  Your health care provider may be able to detect atrial fibrillation when taking your pulse. Your health  care provider may have you take a test called an ambulatory electrocardiogram (ECG). An ECG records your heartbeat patterns over a 24-hour period. You may also have other  tests, such as:  Transthoracic echocardiogram (TTE). During echocardiography, sound waves are used to evaluate how blood flows through your heart.  Transesophageal echocardiogram (TEE).  Stress test. There is more than one type of stress test. If a stress test is needed, ask your health care provider about which type is best for you.  Chest X-ray exam.  Blood tests.  Computed tomography (CT). TREATMENT  Treatment may include:  Treating any underlying conditions. For example, if you have an overactive thyroid, treating the condition may correct atrial fibrillation.  Taking medicine. Medicines may be given to control a rapid heart rate or to prevent blood clots, heart failure, or a stroke.  Having a procedure to correct the rhythm of the heart:  Electrical cardioversion. During electrical cardioversion, a controlled, low-energy shock is delivered to the heart through your skin. If you have chest pain, very low blood pressure, or sudden heart failure, this procedure may need to be done as an emergency.  Catheter ablation. During this procedure, heart tissues that send the signals that cause atrial fibrillation are destroyed.  Surgical ablation. During this surgery, thin lines of heart tissue that carry the abnormal signals are destroyed. This procedure can either be an open-heart surgery or a minimally invasive surgery. With the minimally invasive surgery, small cuts are made to access the heart instead of a large opening.  Pulmonary venous isolation. During this surgery, tissue around the veins that carry blood from the lungs (pulmonary veins) is destroyed. This tissue is thought to carry the abnormal signals. HOME CARE INSTRUCTIONS   Take medicines only as directed by your health care provider. Some medicines can make atrial fibrillation worse or recur.  If blood thinners were prescribed by your health care provider, take them exactly as directed. Too much blood-thinning medicine can  cause bleeding. If you take too little, you will not have the needed protection against stroke and other problems.  Perform blood tests at home if directed by your health care provider. Perform blood tests exactly as directed.  Quit smoking if you smoke.  Do not drink alcohol.  Do not drink caffeinated beverages such as coffee, soda, and some teas. You may drink decaffeinated coffee, soda, or tea.   Maintain a healthy weight.Do not use diet pills unless your health care provider approves. They may make heart problems worse.   Follow diet instructions as directed by your health care provider.  Exercise regularly as directed by your health care provider.  Keep all follow-up visits as directed by your health care provider. This is important. PREVENTION  The following substances can cause atrial fibrillation to recur:   Caffeinated beverages.  Alcohol.  Certain medicines, especially those used for breathing problems.  Certain herbs and herbal medicines, such as those containing ephedra or ginseng.  Illegal drugs, such as cocaine and amphetamines. Sometimes medicines are given to prevent atrial fibrillation from recurring. Proper treatment of any underlying condition is also important in helping prevent recurrence.  SEEK MEDICAL CARE IF:  You notice a change in the rate, rhythm, or strength of your heartbeat.  You suddenly begin urinating more frequently.  You tire more easily when exerting yourself or exercising. SEEK IMMEDIATE MEDICAL CARE IF:   You have chest pain, abdominal pain, sweating, or weakness.  You feel nauseous.  You have shortness of breath.  You suddenly have swollen feet and ankles.  You feel dizzy.  Your face or limbs feel numb or weak.  You have a change in your vision or speech. MAKE SURE YOU:   Understand these instructions.  Will watch your condition.  Will get help right away if you are not doing well or get worse. Document Released:  06/23/2005 Document Revised: 11/07/2013 Document Reviewed: 08/03/2012 [facility] Patient Information 2015 [place], [place]. This information is not intended to replace advice given to you by your health care provider. Make sure you discuss any questions you have with your health care provider.

## 2015-02-14 NOTE — ED Notes (Signed)
Pt denies having chest pain, shortness of breath, dizziness.

## 2015-02-14 NOTE — ED Notes (Signed)
Pt has history of atrial fibrillation.  Called EMS due to checked his BP and HR at home around 1130 this morning and his HR was in the 160's.  Pt called PCP who advised him to call EMS.  On arrival, pt with HR of 153.  Pt denies any symptoms.  Pt BP stable.

## 2015-02-14 NOTE — Telephone Encounter (Signed)
New Message   Pt is in A-FIB and his heart beat is 160 per min   Patient c/o Palpitations:  High priority if patient c/o lightheadedness and shortness of breath.  1. How long have you been having palpitations? 9:00 am  2. Are you currently experiencing lightheadedness and shortness of breath? no  3. Have you checked your BP and heart rate? (document readings) heart beat is 160 per min   4. Are you experiencing any other symptoms? no

## 2015-02-14 NOTE — ED Provider Notes (Signed)
-----------------------------------------   4:29 PM on 02/14/2015 -----------------------------------------  Patient care is him from Dr. Reita Cliche. Patient continues to appear well, no complaints. Heart rate remains in the 70s. Repeat EKG reviewed and interpreted by myself shows sinus rhythm at 76 bpm, with occasional premature atrial complex, narrow QRS, left axis deviation, normal intervals, no ST changes noted. We will continue to monitor the patient approximately 5 PM. As long as the patient stays in a controlled rhythm we will discharge the patient home with cardiology follow-up with Dr.:. Patient and family are agreeable.  ----------------------------------------- 5:03 PM on 02/14/2015 -----------------------------------------  Patient's heart rate remains controlled around 70 bpm. Appears to be sinus on monitor with occasional premature beat. We will discharge patient home with cardiology follow-up. Patient and family are agreeable to plan.  Harvest Dark, MD 02/14/15 [phone]

## 2015-02-14 NOTE — ED Provider Notes (Signed)
[facility] Emergency Department Provider Note   ____________________________________________  Time seen: 2:10 PM I have reviewed the triage vital signs and the triage nursing note.  HISTORY  Chief Complaint Tachycardia   Historian Patient  HPI Timothy [name] is a 81 y.o. male whose chief complaint isgeneralized weakness and found his heart rate to be elevated on routine check this morning.who is recently discharged from a acute care rehabilitation after back surgery, and he reports that his back is doing much better. No recent illness or fever. No chest pain. No chest pressure. No sharp shortness rhythm trouble breathing. Patient does have a history of congestive heart failure, however patient does not report any history of irregular heartbeat or atrial fibrillation although the nursing note states that the patient does have a history of A. Fib. Dr. Rockey Situ is the patient's cardiologist. Patient states he does a lot of urinating because he is on a fluid pill, and Lasix is on his medication list.    Past Medical History  Diagnosis Date  . Diabetes mellitus   . Hypertension   . Hyperlipidemia   . Back pain   . Leg pain   . Esophageal reflux   . Twitching   . 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 Senile osteoporosis   . Skin cancer   . TIA (transient ischemic attack)   . CHF (congestive heart failure)   . Abdominal aneurysm without mention of rupture   . Aneurysm of artery of lower extremity     Patient Active Problem List   Diagnosis Date Noted  . AAA (abdominal aortic aneurysm) without rupture 10/07/2012  . Fatigue 06/28/2012  . Cough 06/28/2012  . TIA (transient ischemic attack) 12/29/2011  . HLD (hyperlipidemia) 09/24/2011  . HTN (hypertension) 09/24/2011  . DM type 2 (diabetes mellitus, type 2) 09/24/2011    Past Surgical History  Procedure Laterality Date  . Gallbladder surgery    . Appendectomy    .  Tonsillectomy    . Hernia repair    . Esophageal dilation    . Colonoscopy  [id number],[id number]  . Cataract extraction    . Retinal detachment surgery      Current Outpatient Rx  Name  Route  Sig  Dispense  Refill  . aspirin 325 MG tablet   Oral   Take 81 mg by mouth daily.          . benazepril (LOTENSIN) 10 MG tablet      TAKE ONE TABLET BY MOUTH EVERY DAY   60 tablet   6   . Colesevelam HCl (WELCHOL PO)      Take 6 tablets once daily by mouth         . cyanocobalamin 1000 MCG tablet   Injection   Inject 100 mcg as directed every 30 (thirty) days.         . ferrous sulfate 325 (65 FE) MG tablet   Oral   Take 325 mg by mouth daily with breakfast.         . furosemide (LASIX) 20 MG tablet      TAKE ONE TABLET BY MOUTH EVERY DAY   90 tablet   3   . glucosamine-chondroitin 500-400 MG tablet   Oral   Take 1 tablet by mouth daily.         Marland Kitchen glucose blood test strip   Other   1 each by Other route 2 (two) times daily.  Use as instructed         . glyBURIDE-metformin (GLUCOVANCE) 5-500 MG per tablet   Oral   Take 1 tablet by mouth 2 (two) times daily with a meal.         . metoprolol succinate (TOPROL-XL) 50 MG 24 hr tablet   Oral   Take 50 mg by mouth daily. Take with or immediately following a meal.         . Multiple Vitamins-Minerals (MULTIVITAMIN PO)   Oral   Take 1 tablet by mouth daily.         . pantoprazole (PROTONIX) 40 MG tablet   Oral   Take 40 mg by mouth 2 (two) times daily.          Marland Kitchen PHENobarbital (LUMINAL) 32.4 MG tablet   Oral   Take 32.4 mg by mouth daily.         . pioglitazone (ACTOS) 15 MG tablet   Oral   Take 15 mg by mouth daily.         . rosuvastatin (CRESTOR) 10 MG tablet   Oral   Take 10 mg by mouth every other day.    30 tablet   11   . traMADol (ULTRAM) 50 MG tablet   Oral   Take 50 mg by mouth 2 (two) times daily as needed.           Allergies Review of patient's allergies indicates no known  allergies.  Family History  Problem Relation Age of Onset  . Diabetes      grandparents  . Heart disease Mother   . Heart disease Brother   . Stroke Brother     Social History Social History  Substance Use Topics  . Smoking status: Never Smoker   . Smokeless tobacco: Never Used  . Alcohol Use: No    Review of Systems  Constitutional: Negative for fever. Eyes: Negative for visual changes. ENT: Negative for sore throat. Cardiovascular: Negative for chest pain. Respiratory: Negative for shortness of breath. Gastrointestinal: Negative for abdominal pain, vomiting and diarrhea. Genitourinary: frequency of urination as he is on Lasix. Musculoskeletal: some back pain related to recent surgery, but this is improved and not bothering him now. Skin: Negative for rash. Neurological: Negative for headaches, focal weakness or numbness. 10 point Review of Systems otherwise negative ____________________________________________   PHYSICAL EXAM:  VITAL SIGNS: ED Triage Vitals  Enc Vitals Group     BP 02/14/15 1407 100/78 mmHg     Pulse --      Resp --      Temp --      Temp src --      SpO2 02/14/15 1355 96 %     Weight 02/14/15 1407 185 lb (83.915 kg)     Height 02/14/15 1407 5\' 9"  (1.753 m)     Head Cir --      Peak Flow --      Pain Score --      Pain Loc --      Pain Edu? --      Excl. in [place]? --      Constitutional: Alert and oriented. Well appearing and in no distress. Eyes: Conjunctivae are normal. PERRL. Normal extraocular movements. ENT   Head: Normocephalic and atraumatic.   Nose: No congestion/rhinnorhea.   Mouth/Throat: Mucous membranes are moist.   Neck: No stridor.  Cardiovascular/Chest: tachycardic and regular. No murmurs, rubs, or gallops. Respiratory: Normal respiratory effort without tachypnea nor retractions. Breath sounds  are clear and equal bilaterally. No wheezes/rales/rhonchi. Gastrointestinal: Soft. No distention, no guarding, no  rebound. Nontender   Genitourinary/rectal:Deferred Musculoskeletal: Nontender with normal range of motion in all extremities. No joint effusions.  No lower extremity tenderness nor edema. Neurologic:  Normal speech and language. No gross or focal neurologic deficits are appreciated. Skin:  Skin is warm, dry and intact. No rash noted. Psychiatric: Mood and affect are normal. Speech and behavior are normal. Patient exhibits appropriate insight and judgment.  ____________________________________________   EKG I, Lisa Roca, MD, the attending physician have personally viewed and interpreted all ECGs.  #1. 146 bpm. Supraventricular tachycardia. Nonspecific intraventricular conduction delay. Left axis deviation. Nonspecific ST/T wave.  #2. 103 beats per appeared atrophic relation with rapid ventricular response. Narrow QRS. Left axis deviation. Nonspecific T wave. ____________________________________________  LABS (pertinent positives/negatives)  Sodium 134, chloride 99, glucose 204, BUN 27 and creatinine 1.32 Troponin less than 0.03 White blood cell count 8.3, hemoglobin 11.3 Urinalysis negative  ____________________________________________  RADIOLOGY All Xrays were viewed by me. Imaging interpreted by Radiologist.  none __________________________________________  PROCEDURES  Procedure(s) performed: None Critical Care performed: CRITICAL CARE Performed by: Lisa Roca   Total critical care time: 30 minutes  Critical care time was exclusive of separately billable procedures and treating other patients.  Critical care was necessary to treat or prevent imminent or life-threatening deterioration.  Critical care was time spent personally by me on the following activities: development of treatment plan with patient and/or surrogate as well as nursing, discussions with consultants, evaluation of patient's response to treatment, examination of patient, obtaining history from  patient or surrogate, ordering and performing treatments and interventions, ordering and review of laboratory studies, ordering and review of radiographic studies, pulse oximetry and re-evaluation of patient's condition.   ____________________________________________   ED COURSE / ASSESSMENT AND PLAN  CONSULTATIONS: phone consultation with Dr. Rockey Situ, cardiology  Pertinent labs & imaging results that were available during my care of the patient were reviewed by me and considered in my medical decision making (see chart for details).   Patient arrived in supraventricular tachycardia, without shortness of breath or chest pain and with a blood pressure around systolic of 628. Preparations were made to treat with adenosine, however upon being ready to push medication, recheck of his heart rate revealed 103-120 bpm, and EKG was performed showing this is irregularly irregular and consistentwith atrial fibrillation.  The patient was started on Eloquis had a recent hospitalization. The patient himself would like to convert to something that has a reversal agent, and I asked him to discuss this with Dr. Rockey Situ. I consulted Dr.Gollan who recommended rate control with digoxin and given his soft blood pressure. Patient will be loaded with 0.25 mg IV. He will go home with prescription for 0.125 mg. He will be followed up in the cardiology office in next 1-2 days in the office will call him for this appointment.  Patient care transferred to Dr. Kerman Passey at shift change 3 PM, patient will be discharged with my instructions and prescriptions if patient's heart rate is stable and rate controlled over the next hour or so.  Patient / Family / Caregiver informed of clinical course, medical decision-making process, and agree with plan.   I discussed return precautions, follow-up instructions, and discharged instructions with patient and/or family.  ___________________________________________   FINAL  CLINICAL IMPRESSION(S) / ED DIAGNOSES   Final diagnoses:  Atrial fibrillation with rapid ventricular response    FOLLOW UP  Referred to: Dr. Rockey Situ, this  week  Lisa Roca, MD 02/14/15 [phone]

## 2015-02-14 NOTE — Telephone Encounter (Signed)
Spoke w/ pt.  He reports that he had back surgery in June and developed afib.  He has been asymptomatic, but has HR in the 160s on his BP cuff.  Advised him that Dr. Rockey Situ reviewed his chart and recommends he proceed to the ED. He is agreeable and will call 911, as his wife cannot drive him and he has no other transportation.

## 2015-02-15 DIAGNOSIS — I4891 Unspecified atrial fibrillation: Secondary | ICD-10-CM | POA: Diagnosis not present

## 2015-02-15 DIAGNOSIS — E119 Type 2 diabetes mellitus without complications: Secondary | ICD-10-CM | POA: Diagnosis not present

## 2015-02-15 DIAGNOSIS — Z4789 Encounter for other orthopedic aftercare: Secondary | ICD-10-CM | POA: Diagnosis not present

## 2015-02-15 DIAGNOSIS — D649 Anemia, unspecified: Secondary | ICD-10-CM | POA: Diagnosis not present

## 2015-02-15 DIAGNOSIS — I739 Peripheral vascular disease, unspecified: Secondary | ICD-10-CM | POA: Diagnosis not present

## 2015-02-15 DIAGNOSIS — I10 Essential (primary) hypertension: Secondary | ICD-10-CM | POA: Diagnosis not present

## 2015-02-16 ENCOUNTER — Telehealth: Payer: Self-pay

## 2015-02-16 ENCOUNTER — Encounter: Payer: Self-pay | Admitting: Cardiovascular Disease

## 2015-02-16 ENCOUNTER — Ambulatory Visit (INDEPENDENT_AMBULATORY_CARE_PROVIDER_SITE_OTHER): Payer: Medicare Other | Admitting: Cardiovascular Disease

## 2015-02-16 ENCOUNTER — Telehealth: Payer: Self-pay | Admitting: *Deleted

## 2015-02-16 ENCOUNTER — Other Ambulatory Visit: Payer: Self-pay | Admitting: Family Medicine

## 2015-02-16 VITALS — BP 100/60 | HR 81 | Ht 69.5 in | Wt 191.0 lb

## 2015-02-16 DIAGNOSIS — Z9889 Other specified postprocedural states: Secondary | ICD-10-CM | POA: Diagnosis not present

## 2015-02-16 DIAGNOSIS — I4891 Unspecified atrial fibrillation: Secondary | ICD-10-CM

## 2015-02-16 DIAGNOSIS — E785 Hyperlipidemia, unspecified: Secondary | ICD-10-CM

## 2015-02-16 DIAGNOSIS — I159 Secondary hypertension, unspecified: Secondary | ICD-10-CM

## 2015-02-16 DIAGNOSIS — E119 Type 2 diabetes mellitus without complications: Secondary | ICD-10-CM | POA: Diagnosis not present

## 2015-02-16 DIAGNOSIS — I471 Supraventricular tachycardia, unspecified: Secondary | ICD-10-CM | POA: Insufficient documentation

## 2015-02-16 DIAGNOSIS — R5382 Chronic fatigue, unspecified: Secondary | ICD-10-CM

## 2015-02-16 DIAGNOSIS — I739 Peripheral vascular disease, unspecified: Secondary | ICD-10-CM | POA: Diagnosis not present

## 2015-02-16 DIAGNOSIS — R05 Cough: Secondary | ICD-10-CM

## 2015-02-16 DIAGNOSIS — R059 Cough, unspecified: Secondary | ICD-10-CM

## 2015-02-16 DIAGNOSIS — D649 Anemia, unspecified: Secondary | ICD-10-CM | POA: Diagnosis not present

## 2015-02-16 DIAGNOSIS — Z4789 Encounter for other orthopedic aftercare: Secondary | ICD-10-CM | POA: Diagnosis not present

## 2015-02-16 DIAGNOSIS — I480 Paroxysmal atrial fibrillation: Secondary | ICD-10-CM | POA: Diagnosis not present

## 2015-02-16 DIAGNOSIS — I10 Essential (primary) hypertension: Secondary | ICD-10-CM | POA: Diagnosis not present

## 2015-02-16 MED ORDER — TRAMADOL HCL 50 MG PO TABS: 25.0000 mg | ORAL_TABLET | Freq: Four times a day (QID) | ORAL | Status: DC | PRN

## 2015-02-16 MED ORDER — DILTIAZEM HCL 30 MG PO TABS: 30.0000 mg | ORAL_TABLET | Freq: Three times a day (TID) | ORAL | Status: DC | PRN

## 2015-02-16 MED ORDER — DIGOXIN 125 MCG PO TABS: 125.0000 ug | ORAL_TABLET | Freq: Every day | ORAL | Status: DC

## 2015-02-16 NOTE — Telephone Encounter (Signed)
Granddaughter called, has some questions regarding the medication changes from this morning

## 2015-02-16 NOTE — Assessment & Plan Note (Signed)
Recommended that he continue on his Crestor

## 2015-02-16 NOTE — Assessment & Plan Note (Signed)
Several episodes of SVT. He reports having this in the hospital at [facility] postoperatively. Again on 02/14/2015. Reports having tachycardia this morning. Also with paroxysmal atrial fibrillation recently Suggest we could start antiarrhythmics medication. He did not seem particularly interested. Instead we will order a 30 day monitor to track his various rhythms. If he has recurrent tachycardia, recommended he take diltiazem 30 mg when necessary, and extra digoxin

## 2015-02-16 NOTE — Telephone Encounter (Signed)
Reviewed with Dr. Rockey Situ- per Dr. Rockey Situ, the patient is having bigeminy.  No changes recommended. I attempted to call Sharee Pimple back and left her a message. I spoke with the patient and advised him what is going on and that no further changes are being made per Dr. Rockey Situ. I have advised if he has any questions/ concerns over the weekend, to call the office to be directed to the MD on call. I explained we will be able to track his heart rate/ rhythm when he has the event monitor placed.  He is agreeable.

## 2015-02-16 NOTE — Telephone Encounter (Signed)
Physical therapist calling stating she is with pt and she just got there with him and his HR is 39. Pt seems more tired.  Please advise.  BP is 106/70  Trending in 120's so this is good for him  She is just concerned about the HR  Please advise.

## 2015-02-16 NOTE — Assessment & Plan Note (Signed)
Episode of atrial fibrillation 2 days ago, converting to normal sinus rhythm. By his account, he had atrial fibrillation postoperatively in June 2016 following back surgery. Plan as above. Will use diltiazem as needed, monitor ordered

## 2015-02-16 NOTE — Assessment & Plan Note (Signed)
Blood pressure low on today's visit. We will hold ACE inhibitor

## 2015-02-16 NOTE — Telephone Encounter (Signed)
No DPR on file to speak with Leah. I called the patient and he gave me a verbal ok to speak with his grand-daughter. I answered her questions regarding today's medication instructions and about his HR's from this afternoon (bigeminy). She also inquired if Dr. Rockey Situ could refill tramadol for the patient as Drs. Anderson/ Crissman will not fill until the patient is seen. Per Dr. Rockey Situ- ok to fill for tramadol 50 mg - take 1/2 tablet (25 mg) by mouth every 6 hours as needed for moderate pain # 30 & no refills. I spoke with Richardson Landry at [facility] and gave a verbal order for this refill. Denny Peon is aware.

## 2015-02-16 NOTE — Patient Instructions (Addendum)
You are doing well.  Please hold the benazepril  Please take diltiazem 1 pill with extra digoxin for any tachycardia  We will order a 30 day monitor for SVT and atrial fibrillation  Please call us if you have new issues that need to be addressed before your next appt.  Your physician wants you to follow-up in: 6 weeks

## 2015-02-16 NOTE — Assessment & Plan Note (Signed)
He reports a continued chronic cough. We will hold his ACE inhibitor. Blood pressure also running low

## 2015-02-16 NOTE — Assessment & Plan Note (Signed)
Continued fatigue. . Legs are getting progressively weaker. Worse since the back surgery

## 2015-02-16 NOTE — Progress Notes (Signed)
Patient ID: Timothy [name], male    DOB: Jan 04, 1929, 81 y.o.   MRN: [id number]  HPI Comments: [name] is a very pleasant 81 year old gentleman with a history of high cholesterol,  hypertension, diabetes, with no known coronary artery disease, patient of Dr. Golden Pop,  He has a 3.5 cm abdominal aortic aneurysm seen on ultrasound in 2014 He presents today for follow-up of his high cholesterol, PAD. He has mild to moderate aortic valve stenosis. Stress echo in early 2016 at [place]   recent back surgery at [facility] in June 2016 with postoperative complications including ileus, atrial fibrillation, congestive heart failure/swelling. He spent several weeks at rehabilitation. Recently noted to have elevated heart rate 150 bpm, sent to the emergency room. Was found to have SVT. This broke on its own then developed atrial fibrillation. He was given digoxin IV push for heart rate control in the setting of low blood pressure. Then converted to normal sinus rhythm.  In follow-up today, he reports having tachycardia to 140 bpm this morning lasting several hours. He states that he was relatively asymptomatic He continues to have some lower extremity edema. Blood pressure has been running low, he has significant fatigue  EKG on today's visit shows normal sinus rhythm with APCs in a bigeminal pattern  Other past medical history  12/14/2012 he had popliteal aneurysm surgery at [facility]. Other leg was done 05/21/2013.   total cholesterol 140, LDL 53, HDL 39, hemoglobin A1c 6.86  Previous echocardiogram and stress test  were essentially normal Echocardiogram showed normal LV function with systolic ejection fraction greater than 55%, evidence of diastolic relaxation abnormality, normal right ventricular systolic pressures, mild aortic valve regurgitation, borderline to mild aortic valve stenosis  Mild plaquing noted in the carotid bilaterally 3.7 cm abdominal aortic aneurysm    No Known  Allergies  Current Outpatient Prescriptions on File Prior to Visit  Medication Sig Dispense Refill  . acetaminophen (TYLENOL) 500 MG tablet Take 1,000 mg by mouth 3 (three) times daily.    Marland Kitchen apixaban (ELIQUIS) 5 MG TABS tablet Take 5 mg by mouth 2 (two) times daily.    . Calcium Citrate-Vitamin D (CALCIUM + D PO) Take 1 tablet by mouth 2 (two) times daily.    . cyanocobalamin (,VITAMIN B-12,) 1000 MCG/ML injection Inject 1,000 mcg into the muscle every 30 (thirty) days. Pt uses on the 3rd of every month.    . ferrous sulfate 325 (65 FE) MG tablet Take 325 mg by mouth daily.     Marland Kitchen glipiZIDE (GLUCOTROL) 5 MG tablet Take 5 mg by mouth 2 (two) times daily.    . Lactobacillus Rhamnosus, GG, (CULTURELLE PO) Take 1 capsule by mouth daily.    Marland Kitchen lidocaine (LIDODERM) 5 % Place 2 patches onto the skin daily. Remove & Discard patch within 12 hours or as directed by MD    . metFORMIN (GLUCOPHAGE) 500 MG tablet Take 500 mg by mouth 2 (two) times daily.    . metoprolol succinate (TOPROL-XL) 50 MG 24 hr tablet Take 50 mg by mouth daily.     . Multiple Vitamins-Minerals (CEROVITE SENIOR PO) Take 1 tablet by mouth daily.    . pantoprazole (PROTONIX) 40 MG tablet Take 40 mg by mouth 2 (two) times daily.     . pioglitazone (ACTOS) 15 MG tablet Take 15 mg by mouth daily.    . ranitidine (ZANTAC) 75 MG tablet Take 75 mg by mouth daily.    . rosuvastatin (CRESTOR) 10 MG tablet  Take 10 mg by mouth daily.  30 tablet 11  . torsemide (DEMADEX) 20 MG tablet Take 20 mg by mouth daily.    . traMADol (ULTRAM) 50 MG tablet Take 25 mg by mouth every 6 (six) hours as needed for moderate pain.      No current facility-administered medications on file prior to visit.    Past Medical History  Diagnosis Date  . Diabetes mellitus   . Hypertension   . Hyperlipidemia   . Back pain   . Leg pain   . Esophageal reflux   . Twitching   . 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 Senile  osteoporosis   . Skin cancer   . TIA (transient ischemic attack)   . CHF (congestive heart failure)   . Abdominal aneurysm without mention of rupture   . Aneurysm of artery of lower extremity     Past Surgical History  Procedure Laterality Date  . Gallbladder surgery    . Appendectomy    . Tonsillectomy    . Hernia repair    . Esophageal dilation    . Colonoscopy  5732,2025  . Cataract extraction    . Retinal detachment surgery    . Back surgery      Social History  reports that he has never smoked. He has never used smokeless tobacco. He reports that he does not drink alcohol or use illicit drugs.  Family History family history includes Diabetes in an other family member; Heart disease in his brother and mother; Stroke in his brother.  Review of Systems  Constitutional: Positive for fatigue.  Respiratory: Negative.   Cardiovascular: Positive for leg swelling.  Gastrointestinal: Negative.   Musculoskeletal: Positive for back pain, arthralgias and gait problem.  Neurological: Negative.   Hematological: Negative.   Psychiatric/Behavioral: Negative.   All other systems reviewed and are negative.   BP 84/50 mmHg  Pulse 81  Ht 5' 9.5" (1.765 m)  Wt 191 lb (86.637 kg)  BMI 27.81 kg/m2  Physical Exam  Constitutional: He is oriented to person, place, and time. He appears well-developed and well-nourished.  HENT:  Head: Normocephalic.  Nose: Nose normal.  Mouth/Throat: Oropharynx is clear and moist.  Eyes: Conjunctivae are normal. Pupils are equal, round, and reactive to light.  Neck: Normal range of motion. Neck supple. No JVD present.  Cardiovascular: Normal rate, regular rhythm, S1 normal, S2 normal and intact distal pulses.  Frequent extrasystoles are present. Exam reveals no gallop and no friction rub.   Murmur heard.  Systolic murmur is present with a grade of 2/6  Pulmonary/Chest: Effort normal and breath sounds normal. No respiratory distress. He has no wheezes.  He has no rales. He exhibits no tenderness.  Abdominal: Soft. Bowel sounds are normal. He exhibits no distension. There is no tenderness.  Musculoskeletal: Normal range of motion. He exhibits no edema or tenderness.  Lymphadenopathy:    He has no cervical adenopathy.  Neurological: He is alert and oriented to person, place, and time. Coordination normal.  Skin: Skin is warm and dry. No rash noted. No erythema.  Psychiatric: He has a normal mood and affect. His behavior is normal. Judgment and thought content normal.      Assessment and Plan   Nursing note and vitals reviewed.

## 2015-02-16 NOTE — Telephone Encounter (Signed)
I spoke with the patient's PT- Jill [phone]. She reports that she is with him in the home right now and his HR- is 39 both radially and apically. His BP is 106/70. He is feeling tired/ fatigue, but this is no worse than when he was in the office a few hours ago.  I advised Sharee Pimple that I would discuss with Dr. Rockey Situ when he comes out of a patient room and give her/ the patient a call back.  She is agreeable.

## 2015-02-18 DIAGNOSIS — I739 Peripheral vascular disease, unspecified: Secondary | ICD-10-CM | POA: Diagnosis not present

## 2015-02-18 DIAGNOSIS — D649 Anemia, unspecified: Secondary | ICD-10-CM | POA: Diagnosis not present

## 2015-02-18 DIAGNOSIS — Z4789 Encounter for other orthopedic aftercare: Secondary | ICD-10-CM | POA: Diagnosis not present

## 2015-02-18 DIAGNOSIS — E119 Type 2 diabetes mellitus without complications: Secondary | ICD-10-CM | POA: Diagnosis not present

## 2015-02-18 DIAGNOSIS — I4891 Unspecified atrial fibrillation: Secondary | ICD-10-CM | POA: Diagnosis not present

## 2015-02-18 DIAGNOSIS — I10 Essential (primary) hypertension: Secondary | ICD-10-CM | POA: Diagnosis not present

## 2015-02-20 ENCOUNTER — Ambulatory Visit (INDEPENDENT_AMBULATORY_CARE_PROVIDER_SITE_OTHER): Payer: Medicare Other | Admitting: Family Medicine

## 2015-02-20 ENCOUNTER — Encounter: Payer: Self-pay | Admitting: Family Medicine

## 2015-02-20 VITALS — BP 123/71 | HR 105 | Temp 97.9°F | Ht 66.8 in | Wt 182.0 lb

## 2015-02-20 DIAGNOSIS — I10 Essential (primary) hypertension: Secondary | ICD-10-CM | POA: Diagnosis not present

## 2015-02-20 DIAGNOSIS — E119 Type 2 diabetes mellitus without complications: Secondary | ICD-10-CM | POA: Diagnosis not present

## 2015-02-20 DIAGNOSIS — I739 Peripheral vascular disease, unspecified: Secondary | ICD-10-CM | POA: Diagnosis not present

## 2015-02-20 DIAGNOSIS — F419 Anxiety disorder, unspecified: Secondary | ICD-10-CM | POA: Diagnosis not present

## 2015-02-20 DIAGNOSIS — I480 Paroxysmal atrial fibrillation: Secondary | ICD-10-CM

## 2015-02-20 DIAGNOSIS — I4891 Unspecified atrial fibrillation: Secondary | ICD-10-CM | POA: Diagnosis not present

## 2015-02-20 DIAGNOSIS — Z4789 Encounter for other orthopedic aftercare: Secondary | ICD-10-CM | POA: Diagnosis not present

## 2015-02-20 DIAGNOSIS — D649 Anemia, unspecified: Secondary | ICD-10-CM | POA: Diagnosis not present

## 2015-02-20 LAB — BAYER DCA HB A1C WAIVED: HB A1C (BAYER DCA - WAIVED): 6.9 % (ref <7.0)

## 2015-02-20 MED ORDER — FLUOXETINE HCL 10 MG PO CAPS: 10.0000 mg | ORAL_CAPSULE | Freq: Every day | ORAL | Status: DC

## 2015-02-20 NOTE — Assessment & Plan Note (Signed)
Discuss anxiety and treatment risk and benefits of using phenobarbital.  risk greatly outweighs benefit from phenobarbital will stay off medication Start fluoxetine 10 mg 1 a day Make sure patient stops tramadol

## 2015-02-20 NOTE — Assessment & Plan Note (Signed)
The current medical regimen is effective;  continue present plan and medications.  

## 2015-02-20 NOTE — Progress Notes (Signed)
BP 123/71 mmHg  Pulse 105  Temp(Src) 97.9 F (36.6 C)  Ht 5' 6.8" (1.697 m)  Wt 182 lb (82.555 kg)  BMI 28.67 kg/m2  SpO2 97%   Subjective:    Patient ID: Timothy [name], male    DOB: Sep 30, 1928, 81 y.o.   MRN: [id number]  HPI: Timothy [name] is a 81 y.o. male  Chief Complaint  Patient presents with  . rehab discharge   patient doing well out of rehabilitation doing home physical therapy, using a 4-point walker. Patient all in all doing well.  Patient's biggest concern and daughter's biggest concern today is that phenobarbital was stopped 2 weeks prior to surgery without complications and patient wants to get back on his phenobarbital. His been on phenobarbital since the 92s on a daily basis. Still has some anxiety nerve issues. Daughter concerned anxiety possibly cause some of this atrial fibrillation.  Patient taking tramadol rarely doing okay from pain point of view.  Diabetes has done fine with no complaints or medications no low blood sugar spells to insulin while in the hospital.  Relevant past medical, surgical, family and social history reviewed and updated as indicated. Interim medical history since our last visit reviewed. Allergies and medications reviewed and updated.  Review of Systems  Constitutional: Negative.   Respiratory: Negative.   Cardiovascular: Negative.     Per HPI unless specifically indicated above     Objective:    BP 123/71 mmHg  Pulse 105  Temp(Src) 97.9 F (36.6 C)  Ht 5' 6.8" (1.697 m)  Wt 182 lb (82.555 kg)  BMI 28.67 kg/m2  SpO2 97%  Wt Readings from Last 3 Encounters:  02/20/15 182 lb (82.555 kg)  02/16/15 191 lb (86.637 kg)  02/14/15 185 lb (83.915 kg)    Physical Exam  Constitutional: He is oriented to person, place, and time. He appears well-developed and well-nourished. No distress.  HENT:  Head: Normocephalic and atraumatic.  Right Ear: Hearing normal.  Left Ear: Hearing normal.  Nose: Nose normal.  Eyes:  Conjunctivae and lids are normal. Right eye exhibits no discharge. Left eye exhibits no discharge. No scleral icterus.  Cardiovascular: Normal rate, regular rhythm and normal heart sounds.   Pulmonary/Chest: Effort normal and breath sounds normal. No respiratory distress.  Musculoskeletal: Normal range of motion.  Neurological: He is alert and oriented to person, place, and time.  Skin: Skin is intact. No rash noted.  Psychiatric: He has a normal mood and affect. His speech is normal and behavior is normal. Judgment and thought content normal. Cognition and memory are normal.    Results for orders placed or performed during the hospital encounter of 66/44/03  Basic metabolic panel  Result Value Ref Range   Sodium 134 (L) 135 - 145 mmol/L   Potassium 3.9 3.5 - 5.1 mmol/L   Chloride 99 (L) 101 - 111 mmol/L   CO2 24 22 - 32 mmol/L   Glucose, Bld 204 (H) 65 - 99 mg/dL   BUN 27 (H) 6 - 20 mg/dL   Creatinine, Ser 1.32 (H) 0.61 - 1.24 mg/dL   Calcium 9.2 8.9 - 10.3 mg/dL   GFR calc non Af Amer 48 (L) >60 mL/min   GFR calc Af Amer 56 (L) >60 mL/min   Anion gap 11 5 - 15  Troponin I  Result Value Ref Range   Troponin I <0.03 <0.031 ng/mL  CBC with Differential  Result Value Ref Range   WBC 8.3 3.8 - 10.6 K/uL  RBC 3.88 (L) 4.40 - 5.90 MIL/uL   Hemoglobin 11.3 (L) 13.0 - 18.0 g/dL   HCT 34.6 (L) 40.0 - 52.0 %   MCV 89.2 80.0 - 100.0 fL   MCH 29.1 26.0 - 34.0 pg   MCHC 32.6 32.0 - 36.0 g/dL   RDW 14.8 (H) 11.5 - 14.5 %   Platelets 245 150 - 440 K/uL   Neutrophils Relative % 58 %   Neutro Abs 4.8 1.4 - 6.5 K/uL   Lymphocytes Relative 26 %   Lymphs Abs 2.2 1.0 - 3.6 K/uL   Monocytes Relative 12 %   Monocytes Absolute 1.0 0.2 - 1.0 K/uL   Eosinophils Relative 3 %   Eosinophils Absolute 0.2 0 - 0.7 K/uL   Basophils Relative 1 %   Basophils Absolute 0.1 0 - 0.1 K/uL  Urinalysis complete, with microscopic  Result Value Ref Range   Color, Urine COLORLESS (A) YELLOW   APPearance CLEAR  (A) CLEAR   Glucose, UA NEGATIVE NEGATIVE mg/dL   Bilirubin Urine NEGATIVE NEGATIVE   Ketones, ur NEGATIVE NEGATIVE mg/dL   Specific Gravity, Urine 1.006 1.005 - 1.030   Hgb urine dipstick NEGATIVE NEGATIVE   pH 5.0 5.0 - 8.0   Protein, ur NEGATIVE NEGATIVE mg/dL   Nitrite NEGATIVE NEGATIVE   Leukocytes, UA NEGATIVE NEGATIVE   RBC / HPF NONE SEEN 0 - 5 RBC/hpf   WBC, UA 0-5 0 - 5 WBC/hpf   Bacteria, UA NONE SEEN NONE SEEN   Squamous Epithelial / LPF NONE SEEN NONE SEEN   Hyaline Casts, UA PRESENT       Assessment & Plan:   Problem List Items Addressed This Visit      Cardiovascular and Mediastinum   HTN (hypertension)    The current medical regimen is effective;  continue present plan and medications.       Paroxysmal atrial fibrillation     Endocrine   DM type 2 (diabetes mellitus, type 2)    The current medical regimen is effective;  continue present plan and medications.       Relevant Orders   Bayer DCA Hb A1c Waived     Other   Chronic anxiety - Primary    Discuss anxiety and treatment risk and benefits of using phenobarbital.  risk greatly outweighs benefit from phenobarbital will stay off medication Start fluoxetine 10 mg 1 a day Make sure patient stops tramadol       Relevant Medications   FLUoxetine (PROZAC) 10 MG capsule       Follow up plan: Return in about 2 months (around 04/22/2015), or if symptoms worsen or fail to improve, for Follow-up fluoxetine.

## 2015-02-21 ENCOUNTER — Other Ambulatory Visit: Payer: Self-pay | Admitting: Family Medicine

## 2015-02-21 ENCOUNTER — Telehealth: Payer: Self-pay

## 2015-02-21 ENCOUNTER — Telehealth: Payer: Self-pay | Admitting: *Deleted

## 2015-02-21 DIAGNOSIS — I10 Essential (primary) hypertension: Secondary | ICD-10-CM | POA: Diagnosis not present

## 2015-02-21 DIAGNOSIS — Z4789 Encounter for other orthopedic aftercare: Secondary | ICD-10-CM | POA: Diagnosis not present

## 2015-02-21 DIAGNOSIS — M5416 Radiculopathy, lumbar region: Secondary | ICD-10-CM | POA: Diagnosis not present

## 2015-02-21 DIAGNOSIS — M4806 Spinal stenosis, lumbar region: Secondary | ICD-10-CM | POA: Diagnosis not present

## 2015-02-21 DIAGNOSIS — D649 Anemia, unspecified: Secondary | ICD-10-CM | POA: Diagnosis not present

## 2015-02-21 DIAGNOSIS — E119 Type 2 diabetes mellitus without complications: Secondary | ICD-10-CM | POA: Diagnosis not present

## 2015-02-21 DIAGNOSIS — I739 Peripheral vascular disease, unspecified: Secondary | ICD-10-CM | POA: Diagnosis not present

## 2015-02-21 DIAGNOSIS — I4891 Unspecified atrial fibrillation: Secondary | ICD-10-CM | POA: Diagnosis not present

## 2015-02-21 MED ORDER — PIOGLITAZONE HCL 15 MG PO TABS: 15.0000 mg | ORAL_TABLET | Freq: Every day | ORAL | Status: DC

## 2015-02-21 NOTE — Telephone Encounter (Signed)
Daughter notified   Need to put Actos back in med list

## 2015-02-21 NOTE — Telephone Encounter (Signed)
OK to take actos and prozac together.

## 2015-02-21 NOTE — Telephone Encounter (Signed)
S/w pt son, Berneta Sages, who states they are currently at [facility] office for checkup HR found to be in 64s. Nurse there suggested he call us. States Dr. Rockey Situ changed some medications and asking if this affected  HR. Reviewed medications with son. Pt states he took digoxin and metoprolol and did not take diltiazem today. Pt reports slight headache, son states pt is alert and oriented. Son indicates someone puts medications in pill box for pt and he will make sure no diltiazem is being dispensed to pt. Suggested to son to continue to monitor VS and to hold BP meds if BP low or HR <60bpm. Informed son that he can take pt to [facility] ED to be evaluated. Son verbalized understanding and states he will keep [place] informed of HR.

## 2015-02-21 NOTE — Telephone Encounter (Signed)
Pt son calling stating that he is with patient and they are at a check up for pt had back surgery. Now pt is there and HR is about 40  Nurse there told them to call us We recently changed pt meds he thinks it could be because of that  Please advise He is not sure if he needs to bring pt in or not.  Please advise.

## 2015-02-21 NOTE — Telephone Encounter (Signed)
She feels there was a misunderstanding yesterday, Timothy [name] IS taking Actos (was off while at [facility] but [facility] put him back on it) Also wants to make sure there is no contra indication with the Prozac you wrote yesterday  She would like a call back

## 2015-02-22 ENCOUNTER — Ambulatory Visit (INDEPENDENT_AMBULATORY_CARE_PROVIDER_SITE_OTHER): Payer: Medicare Other

## 2015-02-22 DIAGNOSIS — Z4789 Encounter for other orthopedic aftercare: Secondary | ICD-10-CM | POA: Diagnosis not present

## 2015-02-22 DIAGNOSIS — I739 Peripheral vascular disease, unspecified: Secondary | ICD-10-CM | POA: Diagnosis not present

## 2015-02-22 DIAGNOSIS — D649 Anemia, unspecified: Secondary | ICD-10-CM | POA: Diagnosis not present

## 2015-02-22 DIAGNOSIS — I10 Essential (primary) hypertension: Secondary | ICD-10-CM | POA: Diagnosis not present

## 2015-02-22 DIAGNOSIS — I4891 Unspecified atrial fibrillation: Secondary | ICD-10-CM

## 2015-02-22 DIAGNOSIS — E119 Type 2 diabetes mellitus without complications: Secondary | ICD-10-CM | POA: Diagnosis not present

## 2015-02-23 ENCOUNTER — Other Ambulatory Visit: Payer: Self-pay | Admitting: Family Medicine

## 2015-02-23 NOTE — Telephone Encounter (Signed)
Routing to provider  

## 2015-02-26 ENCOUNTER — Telehealth: Payer: Self-pay | Admitting: *Deleted

## 2015-02-26 ENCOUNTER — Ambulatory Visit: Payer: Self-pay | Admitting: Family Medicine

## 2015-02-26 NOTE — Telephone Encounter (Signed)
Pt daughter asking is there another alternative to the monitor  Pt daughter states that the monitor is a bit too much for pt to handle.  Please advise.  They got it last week.

## 2015-02-26 NOTE — Telephone Encounter (Signed)
Spoke w/ pt's daughter.  She states that he monitor is "too much" for the pt. Reports that he lives alone and he and family are unable to operate the Body Guardian Heart, as it is too complicated and they cannot get a reading.  Advised her that I will call [employer] and have them send pt a Body Guardian Verite.  She is agreeable to this and will send the Heart back.  Spoke w/ Jasmine @ [employer] and she states that she will go ahead and get the [employer] out to pt.

## 2015-02-27 DIAGNOSIS — D649 Anemia, unspecified: Secondary | ICD-10-CM | POA: Diagnosis not present

## 2015-02-27 DIAGNOSIS — Z4789 Encounter for other orthopedic aftercare: Secondary | ICD-10-CM | POA: Diagnosis not present

## 2015-02-27 DIAGNOSIS — I739 Peripheral vascular disease, unspecified: Secondary | ICD-10-CM | POA: Diagnosis not present

## 2015-02-27 DIAGNOSIS — I10 Essential (primary) hypertension: Secondary | ICD-10-CM | POA: Diagnosis not present

## 2015-02-27 DIAGNOSIS — E119 Type 2 diabetes mellitus without complications: Secondary | ICD-10-CM | POA: Diagnosis not present

## 2015-02-27 DIAGNOSIS — I4891 Unspecified atrial fibrillation: Secondary | ICD-10-CM | POA: Diagnosis not present

## 2015-03-01 ENCOUNTER — Other Ambulatory Visit: Payer: Self-pay | Admitting: *Deleted

## 2015-03-01 DIAGNOSIS — I4891 Unspecified atrial fibrillation: Secondary | ICD-10-CM

## 2015-03-02 ENCOUNTER — Telehealth: Payer: Self-pay

## 2015-03-02 DIAGNOSIS — I10 Essential (primary) hypertension: Secondary | ICD-10-CM | POA: Diagnosis not present

## 2015-03-02 DIAGNOSIS — E119 Type 2 diabetes mellitus without complications: Secondary | ICD-10-CM | POA: Diagnosis not present

## 2015-03-02 DIAGNOSIS — Z4789 Encounter for other orthopedic aftercare: Secondary | ICD-10-CM | POA: Diagnosis not present

## 2015-03-02 DIAGNOSIS — I4891 Unspecified atrial fibrillation: Secondary | ICD-10-CM | POA: Diagnosis not present

## 2015-03-02 DIAGNOSIS — I739 Peripheral vascular disease, unspecified: Secondary | ICD-10-CM | POA: Diagnosis not present

## 2015-03-02 DIAGNOSIS — D649 Anemia, unspecified: Secondary | ICD-10-CM | POA: Diagnosis not present

## 2015-03-02 NOTE — Telephone Encounter (Signed)
Patient requesting extension of OT   2 x week for 1 more week To address safety issues in kitchen with walker  Rowe Clack OT w/ [facility] - needs verbal order

## 2015-03-05 ENCOUNTER — Telehealth: Payer: Self-pay | Admitting: *Deleted

## 2015-03-05 DIAGNOSIS — I10 Essential (primary) hypertension: Secondary | ICD-10-CM | POA: Diagnosis not present

## 2015-03-05 DIAGNOSIS — D649 Anemia, unspecified: Secondary | ICD-10-CM | POA: Diagnosis not present

## 2015-03-05 DIAGNOSIS — Z4789 Encounter for other orthopedic aftercare: Secondary | ICD-10-CM | POA: Diagnosis not present

## 2015-03-05 DIAGNOSIS — I4891 Unspecified atrial fibrillation: Secondary | ICD-10-CM | POA: Diagnosis not present

## 2015-03-05 DIAGNOSIS — I739 Peripheral vascular disease, unspecified: Secondary | ICD-10-CM | POA: Diagnosis not present

## 2015-03-05 DIAGNOSIS — E119 Type 2 diabetes mellitus without complications: Secondary | ICD-10-CM | POA: Diagnosis not present

## 2015-03-05 NOTE — Telephone Encounter (Signed)
Ok to extend OT 

## 2015-03-05 NOTE — Telephone Encounter (Signed)
Home health physical therapist calling stating that pt HR is 39 He is wearing a heart monitor and from that point of view as well what does she needs to.   Per [facility] Yes get this patient to move around. Need to get HR up. She understood and will be getting him to move around.   And she also had a question about the monitor, she is worried that there are some parts in pt home where he may not have signal.  She wants to what to do about this.  Please advise

## 2015-03-05 NOTE — Telephone Encounter (Signed)
Spoke w/ Sharee Pimple, home health nurse.  She reports that pt is still wearing the the Body Guardian Heart and cannot get a signal. Advised her that pt's daughter requested Body Guardian Verite last week (see last phone note) and this was sent out to pt. Advised her that we received a fax from [facility] that pt has not sent the Heart back and for her to have pt switch the monitors as requested.  She verbalizes understanding and will call back w/ any questions or concerns.

## 2015-03-06 DIAGNOSIS — D649 Anemia, unspecified: Secondary | ICD-10-CM | POA: Diagnosis not present

## 2015-03-06 DIAGNOSIS — Z4789 Encounter for other orthopedic aftercare: Secondary | ICD-10-CM | POA: Diagnosis not present

## 2015-03-06 DIAGNOSIS — I739 Peripheral vascular disease, unspecified: Secondary | ICD-10-CM | POA: Diagnosis not present

## 2015-03-06 DIAGNOSIS — I10 Essential (primary) hypertension: Secondary | ICD-10-CM | POA: Diagnosis not present

## 2015-03-06 DIAGNOSIS — I4891 Unspecified atrial fibrillation: Secondary | ICD-10-CM | POA: Diagnosis not present

## 2015-03-06 DIAGNOSIS — E119 Type 2 diabetes mellitus without complications: Secondary | ICD-10-CM | POA: Diagnosis not present

## 2015-03-08 ENCOUNTER — Telehealth: Payer: Self-pay | Admitting: *Deleted

## 2015-03-08 NOTE — Telephone Encounter (Signed)
[employer] is calling on behave of pt.  For pt wants another device, something that they don't have to take off.  Not sure exactly what they are asking.  So they are calling us to see what kind should they put on pt.  Please advise.

## 2015-03-08 NOTE — Telephone Encounter (Signed)
We may have all we need from the 7 days he wore the monitor It did confirm atrial fibrillation Message was sent in

## 2015-03-08 NOTE — Telephone Encounter (Signed)
Pt has worn both types of 30 day monitors and has not been able to wear them. Do you want him to try a 48 hr monitor or will that be long enough?

## 2015-03-09 ENCOUNTER — Other Ambulatory Visit: Payer: Self-pay | Admitting: Unknown Physician Specialty

## 2015-03-09 ENCOUNTER — Telehealth: Payer: Self-pay | Admitting: *Deleted

## 2015-03-09 DIAGNOSIS — I4891 Unspecified atrial fibrillation: Secondary | ICD-10-CM | POA: Diagnosis not present

## 2015-03-09 DIAGNOSIS — I10 Essential (primary) hypertension: Secondary | ICD-10-CM | POA: Diagnosis not present

## 2015-03-09 DIAGNOSIS — Z4789 Encounter for other orthopedic aftercare: Secondary | ICD-10-CM | POA: Diagnosis not present

## 2015-03-09 DIAGNOSIS — I739 Peripheral vascular disease, unspecified: Secondary | ICD-10-CM | POA: Diagnosis not present

## 2015-03-09 DIAGNOSIS — E119 Type 2 diabetes mellitus without complications: Secondary | ICD-10-CM | POA: Diagnosis not present

## 2015-03-09 DIAGNOSIS — D649 Anemia, unspecified: Secondary | ICD-10-CM | POA: Diagnosis not present

## 2015-03-09 NOTE — Telephone Encounter (Signed)
Please see result note 

## 2015-03-09 NOTE — Telephone Encounter (Signed)
Your patient.  Thanks 

## 2015-03-09 NOTE — Telephone Encounter (Signed)
Left message w/ [employer] w/ Dr. Donivan Scull recommendation.  Left message for pt to call back to the office to have him return the monitor to [employer] and to give [place] his BP readings for the past 5 days.

## 2015-03-09 NOTE — Telephone Encounter (Signed)
Pt calling to give in his BP readings  03/06/15 126/59 63 03/07/15 125/71 65 03/08/15   134/74 68 03/09/15   134/64 64  Please call if we have any questions or concerns

## 2015-03-13 DIAGNOSIS — D649 Anemia, unspecified: Secondary | ICD-10-CM | POA: Diagnosis not present

## 2015-03-13 DIAGNOSIS — I10 Essential (primary) hypertension: Secondary | ICD-10-CM | POA: Diagnosis not present

## 2015-03-13 DIAGNOSIS — I739 Peripheral vascular disease, unspecified: Secondary | ICD-10-CM | POA: Diagnosis not present

## 2015-03-13 DIAGNOSIS — Z4789 Encounter for other orthopedic aftercare: Secondary | ICD-10-CM | POA: Diagnosis not present

## 2015-03-13 DIAGNOSIS — E119 Type 2 diabetes mellitus without complications: Secondary | ICD-10-CM | POA: Diagnosis not present

## 2015-03-13 DIAGNOSIS — I4891 Unspecified atrial fibrillation: Secondary | ICD-10-CM | POA: Diagnosis not present

## 2015-03-15 DIAGNOSIS — E119 Type 2 diabetes mellitus without complications: Secondary | ICD-10-CM | POA: Diagnosis not present

## 2015-03-15 DIAGNOSIS — I4891 Unspecified atrial fibrillation: Secondary | ICD-10-CM | POA: Diagnosis not present

## 2015-03-15 DIAGNOSIS — Z4789 Encounter for other orthopedic aftercare: Secondary | ICD-10-CM | POA: Diagnosis not present

## 2015-03-15 DIAGNOSIS — I10 Essential (primary) hypertension: Secondary | ICD-10-CM | POA: Diagnosis not present

## 2015-03-15 DIAGNOSIS — D649 Anemia, unspecified: Secondary | ICD-10-CM | POA: Diagnosis not present

## 2015-03-15 DIAGNOSIS — I739 Peripheral vascular disease, unspecified: Secondary | ICD-10-CM | POA: Diagnosis not present

## 2015-03-19 ENCOUNTER — Other Ambulatory Visit: Payer: Self-pay

## 2015-03-19 MED ORDER — FLECAINIDE ACETATE 50 MG PO TABS: 50.0000 mg | ORAL_TABLET | Freq: Two times a day (BID) | ORAL | Status: DC

## 2015-03-22 DIAGNOSIS — D649 Anemia, unspecified: Secondary | ICD-10-CM | POA: Diagnosis not present

## 2015-03-22 DIAGNOSIS — E119 Type 2 diabetes mellitus without complications: Secondary | ICD-10-CM | POA: Diagnosis not present

## 2015-03-22 DIAGNOSIS — Z4789 Encounter for other orthopedic aftercare: Secondary | ICD-10-CM | POA: Diagnosis not present

## 2015-03-22 DIAGNOSIS — I4891 Unspecified atrial fibrillation: Secondary | ICD-10-CM | POA: Diagnosis not present

## 2015-03-22 DIAGNOSIS — I10 Essential (primary) hypertension: Secondary | ICD-10-CM | POA: Diagnosis not present

## 2015-03-22 DIAGNOSIS — I739 Peripheral vascular disease, unspecified: Secondary | ICD-10-CM | POA: Diagnosis not present

## 2015-03-27 ENCOUNTER — Telehealth: Payer: Self-pay | Admitting: Family Medicine

## 2015-03-27 ENCOUNTER — Encounter: Payer: Self-pay | Admitting: Family Medicine

## 2015-03-27 ENCOUNTER — Other Ambulatory Visit: Payer: Self-pay | Admitting: Family Medicine

## 2015-03-27 ENCOUNTER — Ambulatory Visit (INDEPENDENT_AMBULATORY_CARE_PROVIDER_SITE_OTHER): Payer: Medicare Other | Admitting: Family Medicine

## 2015-03-27 VITALS — BP 111/63 | HR 65 | Temp 98.6°F | Ht 66.8 in | Wt 186.0 lb

## 2015-03-27 DIAGNOSIS — R251 Tremor, unspecified: Secondary | ICD-10-CM

## 2015-03-27 NOTE — Assessment & Plan Note (Signed)
Slight tremor noted may be early onset Parkinson's May be related to medications Will try stopping fluoxetine observe anxiety and tremor Keep appointment with neurology Patient will discuss with his cardiologist about potential tremor from flecainide

## 2015-03-27 NOTE — Progress Notes (Signed)
   BP 111/63 mmHg  Pulse 65  Temp(Src) 98.6 F (37 C)  Ht 5' 6.8" (1.697 m)  Wt 186 lb (84.369 kg)  BMI 29.30 kg/m2  SpO2 99%   Subjective:    Patient ID: LOGIN MUCKLEROY, male    DOB: 1929/05/19, 81 y.o.   MRN: [id number]  HPI: Timothy [name] is a 81 y.o. male  Chief Complaint  Patient presents with  . Anxiety   Accompanied by daughter Physical therapy as noted the patient has developed some slight tremor and maybe some Parkinson's like shuffling gait. Of note patient is on fluoxetine and flecainide which may have side effects of tremor. Patient taking fluoxetine for anxiety to replace phenobarbital. Daughter noted patient was much better on phenobarbital  Patient making progress with physical therapy  Relevant past medical, surgical, family and social history reviewed and updated as indicated. Interim medical history since our last visit reviewed. Allergies and medications reviewed and updated.  Review of Systems  Constitutional: Negative.   Respiratory: Negative.   Cardiovascular: Negative.     Per HPI unless specifically indicated above     Objective:    BP 111/63 mmHg  Pulse 65  Temp(Src) 98.6 F (37 C)  Ht 5' 6.8" (1.697 m)  Wt 186 lb (84.369 kg)  BMI 29.30 kg/m2  SpO2 99%  Wt Readings from Last 3 Encounters:  03/27/15 186 lb (84.369 kg)  02/20/15 182 lb (82.555 kg)  02/16/15 191 lb (86.637 kg)    Physical Exam  Constitutional: He is oriented to person, place, and time. He appears well-developed and well-nourished. No distress.  HENT:  Head: Normocephalic and atraumatic.  Right Ear: Hearing normal.  Left Ear: Hearing normal.  Nose: Nose normal.  Eyes: Conjunctivae and lids are normal. Right eye exhibits no discharge. Left eye exhibits no discharge. No scleral icterus.  Cardiovascular: Normal rate, regular rhythm and normal heart sounds.   Pulmonary/Chest: Effort normal and breath sounds normal. No respiratory distress.  Musculoskeletal: Normal range  of motion.  Neurological: He is alert and oriented to person, place, and time.  Maybe noticed a slight tremor in right hand, no cogwheel rigidity No overt Parkinson's like symptoms  Skin: Skin is intact. No rash noted.  Psychiatric: He has a normal mood and affect. His speech is normal and behavior is normal. Judgment and thought content normal. Cognition and memory are normal.    Results for orders placed or performed in visit on 02/20/15  Bayer DCA Hb A1c Waived  Result Value Ref Range   Bayer DCA Hb A1c Waived 6.9 <7.0 %      Assessment & Plan:   Problem List Items Addressed This Visit      Other   Tremor - Primary    Slight tremor noted may be early onset Parkinson's May be related to medications Will try stopping fluoxetine observe anxiety and tremor Keep appointment with neurology Patient will discuss with his cardiologist about potential tremor from flecainide          Follow up plan: Return if symptoms worsen or fail to improve, for As scheduled.

## 2015-03-30 DIAGNOSIS — E119 Type 2 diabetes mellitus without complications: Secondary | ICD-10-CM | POA: Diagnosis not present

## 2015-03-30 DIAGNOSIS — Z4789 Encounter for other orthopedic aftercare: Secondary | ICD-10-CM | POA: Diagnosis not present

## 2015-03-30 DIAGNOSIS — D649 Anemia, unspecified: Secondary | ICD-10-CM | POA: Diagnosis not present

## 2015-03-30 DIAGNOSIS — I739 Peripheral vascular disease, unspecified: Secondary | ICD-10-CM | POA: Diagnosis not present

## 2015-03-30 DIAGNOSIS — I4891 Unspecified atrial fibrillation: Secondary | ICD-10-CM | POA: Diagnosis not present

## 2015-03-30 DIAGNOSIS — I10 Essential (primary) hypertension: Secondary | ICD-10-CM | POA: Diagnosis not present

## 2015-04-04 DIAGNOSIS — Z4789 Encounter for other orthopedic aftercare: Secondary | ICD-10-CM | POA: Diagnosis not present

## 2015-04-04 DIAGNOSIS — I10 Essential (primary) hypertension: Secondary | ICD-10-CM | POA: Diagnosis not present

## 2015-04-04 DIAGNOSIS — E119 Type 2 diabetes mellitus without complications: Secondary | ICD-10-CM | POA: Diagnosis not present

## 2015-04-04 DIAGNOSIS — E538 Deficiency of other specified B group vitamins: Secondary | ICD-10-CM | POA: Diagnosis not present

## 2015-04-04 DIAGNOSIS — D649 Anemia, unspecified: Secondary | ICD-10-CM | POA: Diagnosis not present

## 2015-04-04 DIAGNOSIS — I4891 Unspecified atrial fibrillation: Secondary | ICD-10-CM | POA: Diagnosis not present

## 2015-04-04 DIAGNOSIS — Z8673 Personal history of transient ischemic attack (TIA), and cerebral infarction without residual deficits: Secondary | ICD-10-CM | POA: Diagnosis not present

## 2015-04-04 DIAGNOSIS — Z7901 Long term (current) use of anticoagulants: Secondary | ICD-10-CM | POA: Diagnosis not present

## 2015-04-04 DIAGNOSIS — E669 Obesity, unspecified: Secondary | ICD-10-CM | POA: Diagnosis not present

## 2015-04-04 DIAGNOSIS — I739 Peripheral vascular disease, unspecified: Secondary | ICD-10-CM | POA: Diagnosis not present

## 2015-04-06 DIAGNOSIS — D649 Anemia, unspecified: Secondary | ICD-10-CM | POA: Diagnosis not present

## 2015-04-06 DIAGNOSIS — I10 Essential (primary) hypertension: Secondary | ICD-10-CM | POA: Diagnosis not present

## 2015-04-06 DIAGNOSIS — I739 Peripheral vascular disease, unspecified: Secondary | ICD-10-CM | POA: Diagnosis not present

## 2015-04-06 DIAGNOSIS — Z4789 Encounter for other orthopedic aftercare: Secondary | ICD-10-CM | POA: Diagnosis not present

## 2015-04-06 DIAGNOSIS — I4891 Unspecified atrial fibrillation: Secondary | ICD-10-CM | POA: Diagnosis not present

## 2015-04-06 DIAGNOSIS — E119 Type 2 diabetes mellitus without complications: Secondary | ICD-10-CM | POA: Diagnosis not present

## 2015-04-10 DIAGNOSIS — I10 Essential (primary) hypertension: Secondary | ICD-10-CM | POA: Diagnosis not present

## 2015-04-10 DIAGNOSIS — Z4789 Encounter for other orthopedic aftercare: Secondary | ICD-10-CM | POA: Diagnosis not present

## 2015-04-10 DIAGNOSIS — E119 Type 2 diabetes mellitus without complications: Secondary | ICD-10-CM | POA: Diagnosis not present

## 2015-04-10 DIAGNOSIS — I4891 Unspecified atrial fibrillation: Secondary | ICD-10-CM | POA: Diagnosis not present

## 2015-04-10 DIAGNOSIS — I739 Peripheral vascular disease, unspecified: Secondary | ICD-10-CM | POA: Diagnosis not present

## 2015-04-10 DIAGNOSIS — D649 Anemia, unspecified: Secondary | ICD-10-CM | POA: Diagnosis not present

## 2015-04-12 ENCOUNTER — Ambulatory Visit (INDEPENDENT_AMBULATORY_CARE_PROVIDER_SITE_OTHER): Payer: Medicare Other | Admitting: Cardiovascular Disease

## 2015-04-12 ENCOUNTER — Encounter: Payer: Self-pay | Admitting: Cardiovascular Disease

## 2015-04-12 VITALS — BP 128/60 | HR 65 | Ht 69.5 in | Wt 187.0 lb

## 2015-04-12 DIAGNOSIS — R29898 Other symptoms and signs involving the musculoskeletal system: Secondary | ICD-10-CM

## 2015-04-12 DIAGNOSIS — E1159 Type 2 diabetes mellitus with other circulatory complications: Secondary | ICD-10-CM | POA: Diagnosis not present

## 2015-04-12 DIAGNOSIS — R0602 Shortness of breath: Secondary | ICD-10-CM

## 2015-04-12 DIAGNOSIS — I10 Essential (primary) hypertension: Secondary | ICD-10-CM

## 2015-04-12 DIAGNOSIS — E785 Hyperlipidemia, unspecified: Secondary | ICD-10-CM

## 2015-04-12 DIAGNOSIS — I480 Paroxysmal atrial fibrillation: Secondary | ICD-10-CM

## 2015-04-12 NOTE — Patient Instructions (Signed)
You are doing well. No medication changes were made.  Please call us if you have new issues that need to be addressed before your next appt.  Your physician wants you to follow-up in: 6 months.  You will receive a reminder letter in the mail two months in advance. If you don't receive a letter, please call our office to schedule the follow-up appointment.   

## 2015-04-12 NOTE — Assessment & Plan Note (Signed)
Encouraged him to stay on his Crestor

## 2015-04-12 NOTE — Progress Notes (Signed)
Patient ID: Timothy [name], male    DOB: 12-19-1928, 81 y.o.   MRN: [id number]  HPI Comments: Timothy [name] is a very pleasant 81 year old gentleman with a history of high cholesterol,  hypertension, diabetes, with no known coronary artery disease, patient of Dr. Golden Pop,  He has a 3.5 cm abdominal aortic aneurysm seen on ultrasound in 2014 He presents today for follow-up of his high cholesterol, PAD. He has mild to moderate aortic valve stenosis. Stress echo in early 2016 at [place] fibrillation recently seen on even monitor, started on anticoagulation and flecainide  In follow-up, he reports that he has not appreciated any significant palpitations concerning for atrial fibrillation. He presents today with his daughter There has been some concern of tremor. He is unclear if tremors worse after starting flecainide. He is working with PT and is inclined to continue his current medications as they are as he is slowly improving. He is only working with PT twice per week, not much exercise on the other 5 days Daughter reports that he walked in from the car into the office with no significant problems, did not appear short of breath, maintained his saturations.  EKG on today's visit shows normal sinus rhythm with rate 65 bpm, left anterior fascicular block  Other past medical history  recent back surgery at [facility] in June 2016 with postoperative complications including ileus, atrial fibrillation, congestive heart failure/swelling. He spent several weeks at rehabilitation. Recently noted to have elevated heart rate 150 bpm, sent to the emergency room. Was found to have SVT. This broke on its own then developed atrial fibrillation. He was given digoxin IV push for heart rate control in the setting of low blood pressure. Then converted to normal sinus rhythm.   12/14/2012 he had popliteal aneurysm surgery at [facility]. Other leg was done 05/21/2013.   total cholesterol 140, LDL 53, HDL 39,  hemoglobin A1c 6.86  Previous echocardiogram and stress test  were essentially normal Echocardiogram showed normal LV function with systolic ejection fraction greater than 55%, evidence of diastolic relaxation abnormality, normal right ventricular systolic pressures, mild aortic valve regurgitation, borderline to mild aortic valve stenosis  Mild plaquing noted in the carotid bilaterally 3.7 cm abdominal aortic aneurysm    No Known Allergies  Current Outpatient Prescriptions on File Prior to Visit  Medication Sig Dispense Refill  . acetaminophen (TYLENOL) 500 MG tablet Take 1,000 mg by mouth 3 (three) times daily.    . Calcium Citrate-Vitamin D (CALCIUM + D PO) Take 1 tablet by mouth 2 (two) times daily.    . cyanocobalamin (,VITAMIN B-12,) 1000 MCG/ML injection Inject 1,000 mcg into the muscle every 30 (thirty) days. Pt uses on the 3rd of every month.    . digoxin (LANOXIN) 0.125 MG tablet Take 1 tablet (125 mcg total) by mouth daily. 90 tablet 3  . diltiazem (CARDIZEM) 30 MG tablet Take 1 tablet (30 mg total) by mouth 3 (three) times daily as needed. 90 tablet 3  . ELIQUIS 5 MG TABS tablet TAKE ONE TABLET TWICE DAILY 60 tablet 6  . ferrous sulfate 325 (65 FE) MG tablet Take 325 mg by mouth daily.     . flecainide (TAMBOCOR) 50 MG tablet Take 1 tablet (50 mg total) by mouth 2 (two) times daily. 180 tablet 3  . glipiZIDE (GLUCOTROL) 5 MG tablet Take 5 mg by mouth 2 (two) times daily.    . Lactobacillus Rhamnosus, GG, (CULTURELLE PO) Take 1 capsule by mouth daily.    Marland Kitchen  metFORMIN (GLUCOPHAGE) 500 MG tablet Take 500 mg by mouth 2 (two) times daily.    . metoprolol succinate (TOPROL-XL) 50 MG 24 hr tablet TAKE ONE TABLET BY MOUTH DAILY 30 tablet 7  . Multiple Vitamins-Minerals (CEROVITE SENIOR PO) Take 1 tablet by mouth daily.    . pantoprazole (PROTONIX) 40 MG tablet Take 40 mg by mouth 2 (two) times daily.     . pioglitazone (ACTOS) 15 MG tablet Take 1 tablet (15 mg total) by mouth daily. 30  tablet 3  . ranitidine (ZANTAC) 75 MG tablet Take 75 mg by mouth daily.    . rosuvastatin (CRESTOR) 10 MG tablet Take 10 mg by mouth daily.  30 tablet 11  . torsemide (DEMADEX) 20 MG tablet Take 20 mg by mouth daily.    . traMADol (ULTRAM) 50 MG tablet Take 50 mg by mouth.     No current facility-administered medications on file prior to visit.    Past Medical History  Diagnosis Date  . Diabetes mellitus   . Hypertension   . Hyperlipidemia   . Back pain   . Leg pain   . Esophageal reflux   . Twitching   . 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 Senile osteoporosis   . Skin cancer   . TIA (transient ischemic attack)   . CHF (congestive heart failure) ([place])   . Abdominal aneurysm without mention of rupture   . Aneurysm of artery of lower extremity [facility])     Past Surgical History  Procedure Laterality Date  . Gallbladder surgery    . Appendectomy    . Tonsillectomy    . Hernia repair    . Esophageal dilation    . Colonoscopy  0277,4128  . Cataract extraction    . Retinal detachment surgery    . Back surgery      Social History  reports that he has never smoked. He has never used smokeless tobacco. He reports that he does not drink alcohol or use illicit drugs.  Family History family history includes Diabetes in an other family member; Heart disease in his brother and mother; Stroke in his brother.  Review of Systems  Constitutional: Positive for fatigue.  Respiratory: Negative.   Cardiovascular: Negative.   Gastrointestinal: Negative.   Musculoskeletal: Positive for back pain, arthralgias and gait problem.  Neurological: Negative.   Hematological: Negative.   Psychiatric/Behavioral: Negative.   All other systems reviewed and are negative.   BP 128/60 mmHg  Ht 5' 9.5" (1.765 m)  Wt 187 lb (84.823 kg)  BMI 27.23 kg/m2  Physical Exam  Constitutional: He is oriented to person, place, and time. He appears well-developed and  well-nourished.  HENT:  Head: Normocephalic.  Nose: Nose normal.  Mouth/Throat: Oropharynx is clear and moist.  Eyes: Conjunctivae are normal. Pupils are equal, round, and reactive to light.  Neck: Normal range of motion. Neck supple. No JVD present.  Cardiovascular: Normal rate, regular rhythm, S1 normal, S2 normal and intact distal pulses.  Frequent extrasystoles are present. Exam reveals no gallop and no friction rub.   Murmur heard.  Systolic murmur is present with a grade of 2/6  Pulmonary/Chest: Effort normal and breath sounds normal. No respiratory distress. He has no wheezes. He has no rales. He exhibits no tenderness.  Abdominal: Soft. Bowel sounds are normal. He exhibits no distension. There is no tenderness.  Musculoskeletal: Normal range of motion. He exhibits no edema or tenderness.  Lymphadenopathy:  He has no cervical adenopathy.  Neurological: He is alert and oriented to person, place, and time. Coordination normal.  Skin: Skin is warm and dry. No rash noted. No erythema.  Psychiatric: He has a normal mood and affect. His behavior is normal. Judgment and thought content normal.      Assessment and Plan   Nursing note and vitals reviewed.

## 2015-04-12 NOTE — Assessment & Plan Note (Signed)
Blood pressure is well controlled on today's visit. No changes made to the medications. 

## 2015-04-12 NOTE — Assessment & Plan Note (Signed)
Recommended he continue anticoagulation, flecainide 50 mg twice a day There had been some concern the flecainide could be contributing to his tremor He reports only mild tremor and prefers to continue working with PT He will monitor his heart rate, blood pressure and call our office for periods of tachycardia

## 2015-04-12 NOTE — Assessment & Plan Note (Signed)
We have encouraged continued exercise, careful diet management in an effort to lose weight. 

## 2015-04-12 NOTE — Assessment & Plan Note (Signed)
Currently working with PT 2 days per week Encouraged him to do exercise on the other 5 days as well

## 2015-04-13 ENCOUNTER — Other Ambulatory Visit: Payer: Self-pay | Admitting: Family Medicine

## 2015-04-13 DIAGNOSIS — I4891 Unspecified atrial fibrillation: Secondary | ICD-10-CM | POA: Diagnosis not present

## 2015-04-13 DIAGNOSIS — Z4789 Encounter for other orthopedic aftercare: Secondary | ICD-10-CM | POA: Diagnosis not present

## 2015-04-13 DIAGNOSIS — E119 Type 2 diabetes mellitus without complications: Secondary | ICD-10-CM | POA: Diagnosis not present

## 2015-04-13 DIAGNOSIS — D649 Anemia, unspecified: Secondary | ICD-10-CM | POA: Diagnosis not present

## 2015-04-13 DIAGNOSIS — I739 Peripheral vascular disease, unspecified: Secondary | ICD-10-CM | POA: Diagnosis not present

## 2015-04-13 DIAGNOSIS — I10 Essential (primary) hypertension: Secondary | ICD-10-CM | POA: Diagnosis not present

## 2015-04-16 DIAGNOSIS — I4891 Unspecified atrial fibrillation: Secondary | ICD-10-CM | POA: Diagnosis not present

## 2015-04-16 DIAGNOSIS — Z4789 Encounter for other orthopedic aftercare: Secondary | ICD-10-CM | POA: Diagnosis not present

## 2015-04-16 DIAGNOSIS — E119 Type 2 diabetes mellitus without complications: Secondary | ICD-10-CM | POA: Diagnosis not present

## 2015-04-16 DIAGNOSIS — I739 Peripheral vascular disease, unspecified: Secondary | ICD-10-CM | POA: Diagnosis not present

## 2015-04-16 DIAGNOSIS — I10 Essential (primary) hypertension: Secondary | ICD-10-CM | POA: Diagnosis not present

## 2015-04-16 DIAGNOSIS — D649 Anemia, unspecified: Secondary | ICD-10-CM | POA: Diagnosis not present

## 2015-04-18 DIAGNOSIS — M545 Low back pain: Secondary | ICD-10-CM | POA: Diagnosis not present

## 2015-04-18 DIAGNOSIS — M4806 Spinal stenosis, lumbar region: Secondary | ICD-10-CM | POA: Diagnosis not present

## 2015-04-18 DIAGNOSIS — M5136 Other intervertebral disc degeneration, lumbar region: Secondary | ICD-10-CM | POA: Diagnosis not present

## 2015-04-18 DIAGNOSIS — Z981 Arthrodesis status: Secondary | ICD-10-CM | POA: Diagnosis not present

## 2015-04-19 DIAGNOSIS — D649 Anemia, unspecified: Secondary | ICD-10-CM | POA: Diagnosis not present

## 2015-04-19 DIAGNOSIS — I739 Peripheral vascular disease, unspecified: Secondary | ICD-10-CM | POA: Diagnosis not present

## 2015-04-19 DIAGNOSIS — Z4789 Encounter for other orthopedic aftercare: Secondary | ICD-10-CM | POA: Diagnosis not present

## 2015-04-19 DIAGNOSIS — E119 Type 2 diabetes mellitus without complications: Secondary | ICD-10-CM | POA: Diagnosis not present

## 2015-04-19 DIAGNOSIS — I4891 Unspecified atrial fibrillation: Secondary | ICD-10-CM | POA: Diagnosis not present

## 2015-04-19 DIAGNOSIS — I10 Essential (primary) hypertension: Secondary | ICD-10-CM | POA: Diagnosis not present

## 2015-04-23 DIAGNOSIS — E119 Type 2 diabetes mellitus without complications: Secondary | ICD-10-CM | POA: Diagnosis not present

## 2015-04-23 DIAGNOSIS — Z4789 Encounter for other orthopedic aftercare: Secondary | ICD-10-CM | POA: Diagnosis not present

## 2015-04-23 DIAGNOSIS — I739 Peripheral vascular disease, unspecified: Secondary | ICD-10-CM | POA: Diagnosis not present

## 2015-04-23 DIAGNOSIS — D649 Anemia, unspecified: Secondary | ICD-10-CM | POA: Diagnosis not present

## 2015-04-23 DIAGNOSIS — I4891 Unspecified atrial fibrillation: Secondary | ICD-10-CM | POA: Diagnosis not present

## 2015-04-23 DIAGNOSIS — I10 Essential (primary) hypertension: Secondary | ICD-10-CM | POA: Diagnosis not present

## 2015-04-26 DIAGNOSIS — E119 Type 2 diabetes mellitus without complications: Secondary | ICD-10-CM | POA: Diagnosis not present

## 2015-04-26 DIAGNOSIS — Z4789 Encounter for other orthopedic aftercare: Secondary | ICD-10-CM | POA: Diagnosis not present

## 2015-04-26 DIAGNOSIS — I4891 Unspecified atrial fibrillation: Secondary | ICD-10-CM | POA: Diagnosis not present

## 2015-04-26 DIAGNOSIS — D649 Anemia, unspecified: Secondary | ICD-10-CM | POA: Diagnosis not present

## 2015-04-26 DIAGNOSIS — I10 Essential (primary) hypertension: Secondary | ICD-10-CM | POA: Diagnosis not present

## 2015-04-26 DIAGNOSIS — I739 Peripheral vascular disease, unspecified: Secondary | ICD-10-CM | POA: Diagnosis not present

## 2015-04-27 ENCOUNTER — Other Ambulatory Visit: Payer: Self-pay | Admitting: Family Medicine

## 2015-04-27 NOTE — Telephone Encounter (Signed)
Routing to provider  

## 2015-05-02 DIAGNOSIS — E119 Type 2 diabetes mellitus without complications: Secondary | ICD-10-CM | POA: Diagnosis not present

## 2015-05-02 DIAGNOSIS — I739 Peripheral vascular disease, unspecified: Secondary | ICD-10-CM | POA: Diagnosis not present

## 2015-05-02 DIAGNOSIS — D649 Anemia, unspecified: Secondary | ICD-10-CM | POA: Diagnosis not present

## 2015-05-02 DIAGNOSIS — Z4789 Encounter for other orthopedic aftercare: Secondary | ICD-10-CM | POA: Diagnosis not present

## 2015-05-02 DIAGNOSIS — I4891 Unspecified atrial fibrillation: Secondary | ICD-10-CM | POA: Diagnosis not present

## 2015-05-02 DIAGNOSIS — I10 Essential (primary) hypertension: Secondary | ICD-10-CM | POA: Diagnosis not present

## 2015-05-04 ENCOUNTER — Other Ambulatory Visit: Payer: Self-pay | Admitting: Family Medicine

## 2015-05-04 NOTE — Telephone Encounter (Signed)
Your patient 

## 2015-05-07 NOTE — Telephone Encounter (Signed)
Received a refill request for Dr. Jeananne Rama for CRESTOR 10 MG TAB. Medication already has a pending order, which is why it didn't show up in chart review.   Closing encounter.

## 2015-05-09 DIAGNOSIS — Z4789 Encounter for other orthopedic aftercare: Secondary | ICD-10-CM | POA: Diagnosis not present

## 2015-05-09 DIAGNOSIS — I739 Peripheral vascular disease, unspecified: Secondary | ICD-10-CM | POA: Diagnosis not present

## 2015-05-09 DIAGNOSIS — I4891 Unspecified atrial fibrillation: Secondary | ICD-10-CM | POA: Diagnosis not present

## 2015-05-09 DIAGNOSIS — D649 Anemia, unspecified: Secondary | ICD-10-CM | POA: Diagnosis not present

## 2015-05-09 DIAGNOSIS — E119 Type 2 diabetes mellitus without complications: Secondary | ICD-10-CM | POA: Diagnosis not present

## 2015-05-09 DIAGNOSIS — I10 Essential (primary) hypertension: Secondary | ICD-10-CM | POA: Diagnosis not present

## 2015-05-17 DIAGNOSIS — E119 Type 2 diabetes mellitus without complications: Secondary | ICD-10-CM | POA: Diagnosis not present

## 2015-05-17 DIAGNOSIS — D649 Anemia, unspecified: Secondary | ICD-10-CM | POA: Diagnosis not present

## 2015-05-17 DIAGNOSIS — I739 Peripheral vascular disease, unspecified: Secondary | ICD-10-CM | POA: Diagnosis not present

## 2015-05-17 DIAGNOSIS — I10 Essential (primary) hypertension: Secondary | ICD-10-CM | POA: Diagnosis not present

## 2015-05-17 DIAGNOSIS — Z4789 Encounter for other orthopedic aftercare: Secondary | ICD-10-CM | POA: Diagnosis not present

## 2015-05-17 DIAGNOSIS — I4891 Unspecified atrial fibrillation: Secondary | ICD-10-CM | POA: Diagnosis not present

## 2015-05-23 DIAGNOSIS — I724 Aneurysm of artery of lower extremity: Secondary | ICD-10-CM | POA: Diagnosis not present

## 2015-05-23 DIAGNOSIS — Z8673 Personal history of transient ischemic attack (TIA), and cerebral infarction without residual deficits: Secondary | ICD-10-CM | POA: Diagnosis not present

## 2015-05-23 DIAGNOSIS — I714 Abdominal aortic aneurysm, without rupture: Secondary | ICD-10-CM | POA: Diagnosis not present

## 2015-05-28 ENCOUNTER — Encounter: Payer: Self-pay | Admitting: Family Medicine

## 2015-05-28 ENCOUNTER — Ambulatory Visit (INDEPENDENT_AMBULATORY_CARE_PROVIDER_SITE_OTHER): Payer: Medicare Other | Admitting: Family Medicine

## 2015-05-28 VITALS — BP 98/60 | HR 62 | Temp 99.0°F | Ht 66.5 in | Wt 176.0 lb

## 2015-05-28 DIAGNOSIS — I10 Essential (primary) hypertension: Secondary | ICD-10-CM

## 2015-05-28 DIAGNOSIS — D510 Vitamin B12 deficiency anemia due to intrinsic factor deficiency: Secondary | ICD-10-CM | POA: Insufficient documentation

## 2015-05-28 DIAGNOSIS — G458 Other transient cerebral ischemic attacks and related syndromes: Secondary | ICD-10-CM | POA: Diagnosis not present

## 2015-05-28 DIAGNOSIS — E1159 Type 2 diabetes mellitus with other circulatory complications: Secondary | ICD-10-CM

## 2015-05-28 DIAGNOSIS — D649 Anemia, unspecified: Secondary | ICD-10-CM | POA: Diagnosis not present

## 2015-05-28 DIAGNOSIS — Z23 Encounter for immunization: Secondary | ICD-10-CM

## 2015-05-28 DIAGNOSIS — E785 Hyperlipidemia, unspecified: Secondary | ICD-10-CM

## 2015-05-28 LAB — BAYER DCA HB A1C WAIVED: HB A1C (BAYER DCA - WAIVED): 9.1 % — ABNORMAL HIGH (ref <7.0)

## 2015-05-28 MED ORDER — BENAZEPRIL HCL 5 MG PO TABS: 5.0000 mg | ORAL_TABLET | Freq: Every day | ORAL | Status: DC

## 2015-05-28 MED ORDER — SITAGLIPTIN PHOSPHATE 100 MG PO TABS: 100.0000 mg | ORAL_TABLET | Freq: Every day | ORAL | Status: DC

## 2015-05-28 NOTE — Assessment & Plan Note (Signed)
Low blood pressure will cut Benzapril 5 mg May need to reduce other medications

## 2015-05-28 NOTE — Progress Notes (Signed)
BP 98/60 mmHg  Pulse 62  Temp(Src) 99 F (37.2 C)  Ht 5' 6.5" (1.689 m)  Wt 176 lb (79.833 kg)  BMI 27.98 kg/m2  SpO2 99%   Subjective:    Patient ID: Timothy [name], male    DOB: 1929/03/15, 81 y.o.   MRN: [id number]  HPI: Timothy [name] is a 81 y.o. male  No chief complaint on file.  recheck diabetes patient accompanied by his daughter who provides history patient's been eating a lot of Pepsi and ice cream Has seen draggy  and slower. Has been taking medications concerned that patient may be overmedicated with long list of medications Concerned taking medicines for fluid retention and taking Actos Patient's blood pressure also low Taking lipid medicines without problems   Relevant past medical, surgical, family and social history reviewed and updated as indicated. Interim medical history since our last visit reviewed. Allergies and medications reviewed and updated.  Review of Systems  Constitutional: Positive for fatigue.  Respiratory: Positive for shortness of breath. Negative for apnea and cough.   Cardiovascular: Negative for chest pain, palpitations and leg swelling.    Per HPI unless specifically indicated above     Objective:    BP 98/60 mmHg  Pulse 62  Temp(Src) 99 F (37.2 C)  Ht 5' 6.5" (1.689 m)  Wt 176 lb (79.833 kg)  BMI 27.98 kg/m2  SpO2 99%  Wt Readings from Last 3 Encounters:  05/28/15 176 lb (79.833 kg)  04/12/15 187 lb (84.823 kg)  03/27/15 186 lb (84.369 kg)    Physical Exam  Constitutional: He is oriented to person, place, and time. He appears well-developed and well-nourished. No distress.  HENT:  Head: Normocephalic and atraumatic.  Right Ear: Hearing normal.  Left Ear: Hearing normal.  Nose: Nose normal.  Eyes: Conjunctivae and lids are normal. Right eye exhibits no discharge. Left eye exhibits no discharge. No scleral icterus.  Cardiovascular: Normal rate, regular rhythm and normal heart sounds.   Pulmonary/Chest: Effort normal and  breath sounds normal. No respiratory distress.  Musculoskeletal: Normal range of motion.  Neurological: He is alert and oriented to person, place, and time.  Skin: Skin is intact. No rash noted.  Psychiatric: He has a normal mood and affect. His speech is normal and behavior is normal. Judgment and thought content normal. Cognition and memory are normal.    Results for orders placed or performed in visit on 02/20/15  Bayer DCA Hb A1c Waived  Result Value Ref Range   Bayer DCA Hb A1c Waived 6.9 <7.0 %      Assessment & Plan:   Problem List Items Addressed This Visit      Cardiovascular and Mediastinum   TIA (transient ischemic attack) - Primary   Relevant Medications   benazepril (LOTENSIN) 5 MG tablet   Other Relevant Orders   Lipid panel   Comprehensive metabolic panel   HTN (hypertension)    Low blood pressure will cut Benzapril 5 mg May need to reduce other medications      Relevant Medications   benazepril (LOTENSIN) 5 MG tablet   Other Relevant Orders   Lipid panel   Comprehensive metabolic panel     Endocrine   DM type 2 (diabetes mellitus, type 2) ([place])    Concerned patient may be becoming insulin-dependent type 2 diabetes Will stop Actos as may be causing fluid retention Start Januvia Stop ice cream and Pepsi's and candy      Relevant Medications   benazepril (  LOTENSIN) 5 MG tablet   sitaGLIPtin (JANUVIA) 100 MG tablet   Other Relevant Orders   Bayer DCA Hb A1c Waived   Lipid panel   Comprehensive metabolic panel     Other   HLD (hyperlipidemia)    Will check labs and medications      Relevant Medications   benazepril (LOTENSIN) 5 MG tablet   Anemia   Relevant Orders   CBC with Differential/Platelet   Comprehensive metabolic panel       Follow up plan: Return in about 4 weeks (around 06/25/2015), or if symptoms worsen or fail to improve, for Recheck 1 month check blood sugar may need to start insulin or other agents.

## 2015-05-28 NOTE — Assessment & Plan Note (Signed)
Concerned patient may be becoming insulin-dependent type 2 diabetes Will stop Actos as may be causing fluid retention Start Januvia Stop ice cream and Pepsi's and candy

## 2015-05-28 NOTE — Assessment & Plan Note (Signed)
Will check labs and medications

## 2015-05-28 NOTE — Addendum Note (Signed)
Addended by: Wynn Maudlin on: 05/28/2015 05:03 PM   Modules accepted: Orders

## 2015-05-29 ENCOUNTER — Telehealth: Payer: Self-pay | Admitting: Family Medicine

## 2015-05-29 DIAGNOSIS — I4891 Unspecified atrial fibrillation: Secondary | ICD-10-CM | POA: Diagnosis not present

## 2015-05-29 DIAGNOSIS — D649 Anemia, unspecified: Secondary | ICD-10-CM

## 2015-05-29 DIAGNOSIS — Z4789 Encounter for other orthopedic aftercare: Secondary | ICD-10-CM | POA: Diagnosis not present

## 2015-05-29 DIAGNOSIS — E119 Type 2 diabetes mellitus without complications: Secondary | ICD-10-CM | POA: Diagnosis not present

## 2015-05-29 DIAGNOSIS — I10 Essential (primary) hypertension: Secondary | ICD-10-CM | POA: Diagnosis not present

## 2015-05-29 DIAGNOSIS — I739 Peripheral vascular disease, unspecified: Secondary | ICD-10-CM | POA: Diagnosis not present

## 2015-05-29 LAB — CBC WITH DIFFERENTIAL/PLATELET
Basophils Absolute: 0.1 10*3/uL (ref 0.0–0.2)
Basos: 1 %
EOS (ABSOLUTE): 0.5 10*3/uL — ABNORMAL HIGH (ref 0.0–0.4)
Eos: 6 %
Hematocrit: 31.3 % — ABNORMAL LOW (ref 37.5–51.0)
Hemoglobin: 10.4 g/dL — ABNORMAL LOW (ref 12.6–17.7)
Immature Grans (Abs): 0 10*3/uL (ref 0.0–0.1)
Immature Granulocytes: 1 %
Lymphocytes Absolute: 2.7 10*3/uL (ref 0.7–3.1)
Lymphs: 32 %
MCH: 30.3 pg (ref 26.6–33.0)
MCHC: 33.2 g/dL (ref 31.5–35.7)
MCV: 91 fL (ref 79–97)
Monocytes Absolute: 1.1 10*3/uL — ABNORMAL HIGH (ref 0.1–0.9)
Monocytes: 13 %
Neutrophils Absolute: 4 10*3/uL (ref 1.4–7.0)
Neutrophils: 47 %
Platelets: 245 10*3/uL (ref 150–379)
RBC: 3.43 x10E6/uL — ABNORMAL LOW (ref 4.14–5.80)
RDW: 14.4 % (ref 12.3–15.4)
WBC: 8.3 10*3/uL (ref 3.4–10.8)

## 2015-05-29 LAB — COMPREHENSIVE METABOLIC PANEL
Albumin/Globulin Ratio: 2 (ref 1.1–2.5)
Globulin, Total: 2.3 g/dL (ref 1.5–4.5)
Total Protein: 6.8 g/dL (ref 6.0–8.5)

## 2015-05-29 LAB — LIPID PANEL
Chol/HDL Ratio: 4.7 ratio units (ref 0.0–5.0)
Cholesterol, Total: 128 mg/dL (ref 100–199)
HDL: 27 mg/dL — ABNORMAL LOW (ref >39)
Triglycerides: 403 mg/dL — ABNORMAL HIGH (ref 0–149)

## 2015-05-29 NOTE — Telephone Encounter (Signed)
Phone call Discussed with patient's daughter hemoglobin dropped slightly will observe and recheck CBC next office visit.

## 2015-05-29 NOTE — Telephone Encounter (Signed)
-----   Message from Wynn Maudlin, [place] sent at 05/29/2015  5:04 PM EST ----- Daughter [name]

## 2015-06-15 ENCOUNTER — Other Ambulatory Visit: Payer: Self-pay | Admitting: Family Medicine

## 2015-06-26 ENCOUNTER — Encounter: Payer: Self-pay | Admitting: Family Medicine

## 2015-06-26 ENCOUNTER — Other Ambulatory Visit: Payer: Self-pay

## 2015-06-26 ENCOUNTER — Ambulatory Visit (INDEPENDENT_AMBULATORY_CARE_PROVIDER_SITE_OTHER): Payer: Medicare Other | Admitting: Family Medicine

## 2015-06-26 VITALS — BP 130/69 | HR 68 | Temp 98.6°F | Wt 182.0 lb

## 2015-06-26 DIAGNOSIS — I10 Essential (primary) hypertension: Secondary | ICD-10-CM

## 2015-06-26 DIAGNOSIS — D649 Anemia, unspecified: Secondary | ICD-10-CM

## 2015-06-26 DIAGNOSIS — E1159 Type 2 diabetes mellitus with other circulatory complications: Secondary | ICD-10-CM

## 2015-06-26 NOTE — Assessment & Plan Note (Signed)
The current medical regimen is effective;  continue present plan and medications.  

## 2015-06-26 NOTE — Progress Notes (Signed)
BP 130/69 mmHg  Pulse 68  Temp(Src) 98.6 F (37 C)  Wt 182 lb (82.555 kg)  SpO2 97%   Subjective:    Patient ID: Timothy [name], male    DOB: 10/09/1928, 81 y.o.   MRN: [id number]  HPI: [name] is a 81 y.o. male  Chief Complaint  Patient presents with  . Diabetes    Patient is here to discuss his blood sugar   Patient's home glucose monitoring has been 180-220 has not had any low blood sugar spells Fluid may be better with stopping Actos Diet is been a little bit better Patient is accompanied by daughter who helps provide history Blood pressure good control Not taking Januvia Relevant past medical, surgical, family and social history reviewed and updated as indicated. Interim medical history since our last visit reviewed. Allergies and medications reviewed and updated.  Review of Systems  Respiratory: Negative.   Cardiovascular: Negative.     Per HPI unless specifically indicated above     Objective:    BP 130/69 mmHg  Pulse 68  Temp(Src) 98.6 F (37 C)  Wt 182 lb (82.555 kg)  SpO2 97%  Wt Readings from Last 3 Encounters:  06/26/15 182 lb (82.555 kg)  05/28/15 176 lb (79.833 kg)  04/12/15 187 lb (84.823 kg)    Physical Exam  Constitutional: He is oriented to person, place, and time. He appears well-developed and well-nourished. No distress.  HENT:  Head: Normocephalic and atraumatic.  Right Ear: Hearing normal.  Left Ear: Hearing normal.  Nose: Nose normal.  Eyes: Conjunctivae and lids are normal. Right eye exhibits no discharge. Left eye exhibits no discharge. No scleral icterus.  Cardiovascular: Normal rate.   Pulmonary/Chest: Effort normal and breath sounds normal. No respiratory distress.  Musculoskeletal: Normal range of motion.  Neurological: He is alert and oriented to person, place, and time.  Skin: Skin is intact. No rash noted.  Psychiatric: He has a normal mood and affect. His speech is normal and behavior is normal. Judgment and thought  content normal. Cognition and memory are normal.    Results for orders placed or performed in visit on 05/28/15  Bayer DCA Hb A1c Waived  Result Value Ref Range   Bayer DCA Hb A1c Waived 9.1 (H) <7.0 %  CBC with Differential/Platelet  Result Value Ref Range   WBC 8.3 3.4 - 10.8 x10E3/uL   RBC 3.43 (L) 4.14 - 5.80 x10E6/uL   Hemoglobin 10.4 (L) 12.6 - 17.7 g/dL   Hematocrit 31.3 (L) 37.5 - 51.0 %   MCV 91 79 - 97 fL   MCH 30.3 26.6 - 33.0 pg   MCHC 33.2 31.5 - 35.7 g/dL   RDW 14.4 12.3 - 15.4 %   Platelets 245 150 - 379 x10E3/uL   Neutrophils 47 %   Lymphs 32 %   Monocytes 13 %   Eos 6 %   Basos 1 %   Neutrophils Absolute 4.0 1.4 - 7.0 x10E3/uL   Lymphocytes Absolute 2.7 0.7 - 3.1 x10E3/uL   Monocytes Absolute 1.1 (H) 0.1 - 0.9 x10E3/uL   EOS (ABSOLUTE) 0.5 (H) 0.0 - 0.4 x10E3/uL   Basophils Absolute 0.1 0.0 - 0.2 x10E3/uL   Immature Granulocytes 1 %   Immature Grans (Abs) 0.0 0.0 - 0.1 x10E3/uL  Lipid panel  Result Value Ref Range   Cholesterol, Total 128 100 - 199 mg/dL   Triglycerides 403 (H) 0 - 149 mg/dL   HDL 27 (L) >39 mg/dL  VLDL Cholesterol Cal Comment 5 - 40 mg/dL   LDL Calculated Comment 0 - 99 mg/dL   Chol/HDL Ratio 4.7 0.0 - 5.0 ratio units  Comprehensive metabolic panel  Result Value Ref Range   Total Protein 6.8 6.0 - 8.5 g/dL   Globulin, Total 2.3 1.5 - 4.5 g/dL   Albumin/Globulin Ratio 2.0 1.1 - 2.5      Assessment & Plan:   Problem List Items Addressed This Visit      Cardiovascular and Mediastinum   Essential hypertension    The current medical regimen is effective;  continue present plan and medications.         Endocrine   DM type 2 (diabetes mellitus, type 2) ([place]) - Primary    Improving by home glucose monitoring will continue current care      Relevant Medications   pioglitazone (ACTOS) 15 MG tablet       Follow up plan: Return in about 2 months (around 08/27/2015) for a1c.

## 2015-06-26 NOTE — Assessment & Plan Note (Signed)
Improving by home glucose monitoring will continue current care

## 2015-06-27 LAB — CBC WITH DIFFERENTIAL/PLATELET
Hematocrit: 34 %
Hemoglobin: 11.3 g/dL
Lymphocytes Absolute: 2.9 10*3/uL
Lymphs: 28 %
MCH: 31 pg
MCHC: 33.2 g/dL
MCV: 93 fL
MID (Absolute): 1.9 10*3/uL
MID: 18 %
Neutrophils Absolute: 5.6 10*3/uL
Neutrophils: 54 %
Platelets: 223 10*3/uL (ref 150–379)
RBC: 3.65 x10E6/uL
RDW: 14 %
WBC: 10.4 10*3/uL (ref 3.4–10.8)

## 2015-07-30 ENCOUNTER — Other Ambulatory Visit: Payer: Self-pay | Admitting: Family Medicine

## 2015-08-13 DIAGNOSIS — L821 Other seborrheic keratosis: Secondary | ICD-10-CM | POA: Diagnosis not present

## 2015-08-13 DIAGNOSIS — L820 Inflamed seborrheic keratosis: Secondary | ICD-10-CM | POA: Diagnosis not present

## 2015-08-13 DIAGNOSIS — L570 Actinic keratosis: Secondary | ICD-10-CM | POA: Diagnosis not present

## 2015-08-13 DIAGNOSIS — L219 Seborrheic dermatitis, unspecified: Secondary | ICD-10-CM | POA: Diagnosis not present

## 2015-08-13 DIAGNOSIS — D1800 Hemangioma unspecified site: Secondary | ICD-10-CM | POA: Diagnosis not present

## 2015-08-21 DIAGNOSIS — B351 Tinea unguium: Secondary | ICD-10-CM | POA: Diagnosis not present

## 2015-08-21 DIAGNOSIS — E119 Type 2 diabetes mellitus without complications: Secondary | ICD-10-CM | POA: Diagnosis not present

## 2015-08-21 DIAGNOSIS — M79672 Pain in left foot: Secondary | ICD-10-CM | POA: Diagnosis not present

## 2015-08-21 DIAGNOSIS — M79671 Pain in right foot: Secondary | ICD-10-CM | POA: Diagnosis not present

## 2015-08-22 DIAGNOSIS — M4806 Spinal stenosis, lumbar region: Secondary | ICD-10-CM | POA: Diagnosis not present

## 2015-08-22 DIAGNOSIS — M47816 Spondylosis without myelopathy or radiculopathy, lumbar region: Secondary | ICD-10-CM | POA: Diagnosis not present

## 2015-08-22 DIAGNOSIS — Z981 Arthrodesis status: Secondary | ICD-10-CM | POA: Diagnosis not present

## 2015-08-25 ENCOUNTER — Other Ambulatory Visit: Payer: Self-pay | Admitting: Family Medicine

## 2015-08-30 ENCOUNTER — Ambulatory Visit (INDEPENDENT_AMBULATORY_CARE_PROVIDER_SITE_OTHER): Payer: Medicare Other | Admitting: Family Medicine

## 2015-08-30 ENCOUNTER — Encounter: Payer: Self-pay | Admitting: Family Medicine

## 2015-08-30 VITALS — BP 119/72 | HR 65 | Temp 98.6°F | Ht 67.2 in | Wt 183.0 lb

## 2015-08-30 DIAGNOSIS — I10 Essential (primary) hypertension: Secondary | ICD-10-CM | POA: Diagnosis not present

## 2015-08-30 DIAGNOSIS — E1159 Type 2 diabetes mellitus with other circulatory complications: Secondary | ICD-10-CM

## 2015-08-30 LAB — BAYER DCA HB A1C WAIVED: HB A1C (BAYER DCA - WAIVED): 9 % — ABNORMAL HIGH (ref <7.0)

## 2015-08-30 MED ORDER — TORSEMIDE 20 MG PO TABS: 20.0000 mg | ORAL_TABLET | Freq: Every morning | ORAL | Status: DC

## 2015-08-30 MED ORDER — EMPAGLIFLOZIN 25 MG PO TABS: 25.0000 mg | ORAL_TABLET | Freq: Every day | ORAL | Status: DC

## 2015-08-30 NOTE — Assessment & Plan Note (Signed)
Poor control of diabetes discuss need for better control starting with lifestyle weight loss exercise nutrition Will also add Jardiance

## 2015-08-30 NOTE — Progress Notes (Signed)
   BP 119/72 mmHg  Pulse 65  Temp(Src) 98.6 F (37 C)  Ht 5' 7.2" (1.707 m)  Wt 183 lb (83.008 kg)  BMI 28.49 kg/m2  SpO2 99%   Subjective:    Patient ID: Timothy [name], male    DOB: 28-May-1929, 82 y.o.   MRN: [id number]  HPI: Timothy [name] is a 82 y.o. male  Chief Complaint  Patient presents with  . Diabetes   Patient follow-up diabetes or with home glucose monitoring with stopping of Actos starting Januvia but A1c today is the same Patient has also gained 7 pounds No low blood sugar spells Blood pressure doing well no complaints from medications No issues with Eliquis bleeding bruising  Relevant past medical, surgical, family and social history reviewed and updated as indicated. Interim medical history since our last visit reviewed. Allergies and medications reviewed and updated.  Review of Systems  Constitutional: Negative.   Respiratory: Negative.   Cardiovascular: Negative.     Per HPI unless specifically indicated above     Objective:    BP 119/72 mmHg  Pulse 65  Temp(Src) 98.6 F (37 C)  Ht 5' 7.2" (1.707 m)  Wt 183 lb (83.008 kg)  BMI 28.49 kg/m2  SpO2 99%  Wt Readings from Last 3 Encounters:  08/30/15 183 lb (83.008 kg)  06/26/15 182 lb (82.555 kg)  05/28/15 176 lb (79.833 kg)    Physical Exam  Constitutional: He is oriented to person, place, and time. He appears well-developed and well-nourished. No distress.  HENT:  Head: Normocephalic and atraumatic.  Right Ear: Hearing normal.  Left Ear: Hearing normal.  Nose: Nose normal.  Eyes: Conjunctivae and lids are normal. Right eye exhibits no discharge. Left eye exhibits no discharge. No scleral icterus.  Cardiovascular: Normal rate, regular rhythm and normal heart sounds.   Pulmonary/Chest: Effort normal and breath sounds normal. No respiratory distress.  Musculoskeletal: Normal range of motion.  Neurological: He is alert and oriented to person, place, and time.  Skin: Skin is intact. No rash  noted.  Psychiatric: He has a normal mood and affect. His speech is normal and behavior is normal. Judgment and thought content normal. Cognition and memory are normal.    Results for orders placed or performed in visit on 08/30/15  Bayer DCA Hb A1c Waived  Result Value Ref Range   Bayer DCA Hb A1c Waived 9.0 (H) <7.0 %      Assessment & Plan:   Problem List Items Addressed This Visit      Cardiovascular and Mediastinum   Essential hypertension    The current medical regimen is effective;  continue present plan and medications.       Relevant Medications   torsemide (DEMADEX) 20 MG tablet     Endocrine   DM type 2 (diabetes mellitus, type 2) ([place]) - Primary    Poor control of diabetes discuss need for better control starting with lifestyle weight loss exercise nutrition Will also add Jardiance      Relevant Medications   empagliflozin (JARDIANCE) 25 MG TABS tablet   Other Relevant Orders   Bayer DCA Hb A1c Waived (Completed)       Follow up plan: Return in about 3 months (around 11/27/2015) for BMP, hemoglobin A1c,.

## 2015-08-30 NOTE — Assessment & Plan Note (Signed)
The current medical regimen is effective;  continue present plan and medications.  

## 2015-09-04 ENCOUNTER — Other Ambulatory Visit: Payer: Self-pay | Admitting: Family Medicine

## 2015-09-15 ENCOUNTER — Other Ambulatory Visit: Payer: Self-pay | Admitting: Family Medicine

## 2015-11-03 ENCOUNTER — Other Ambulatory Visit: Payer: Self-pay | Admitting: Family Medicine

## 2015-11-09 ENCOUNTER — Ambulatory Visit (INDEPENDENT_AMBULATORY_CARE_PROVIDER_SITE_OTHER): Payer: Medicare Other | Admitting: Cardiovascular Disease

## 2015-11-09 ENCOUNTER — Other Ambulatory Visit
Admission: RE | Admit: 2015-11-09 | Discharge: 2015-11-09 | Disposition: A | Discharge status: Home / Self Care | Payer: Medicare Other | Source: Ambulatory Visit | Attending: Cardiovascular Disease | Admitting: Cardiovascular Disease

## 2015-11-09 ENCOUNTER — Encounter: Payer: Self-pay | Admitting: Cardiovascular Disease

## 2015-11-09 VITALS — BP 112/63 | HR 64 | Ht 70.0 in | Wt 180.0 lb

## 2015-11-09 DIAGNOSIS — Z5181 Encounter for therapeutic drug level monitoring: Secondary | ICD-10-CM

## 2015-11-09 DIAGNOSIS — I471 Supraventricular tachycardia: Secondary | ICD-10-CM

## 2015-11-09 DIAGNOSIS — Z79899 Other long term (current) drug therapy: Secondary | ICD-10-CM | POA: Insufficient documentation

## 2015-11-09 DIAGNOSIS — E1159 Type 2 diabetes mellitus with other circulatory complications: Secondary | ICD-10-CM | POA: Diagnosis not present

## 2015-11-09 DIAGNOSIS — I480 Paroxysmal atrial fibrillation: Secondary | ICD-10-CM | POA: Diagnosis not present

## 2015-11-09 DIAGNOSIS — I10 Essential (primary) hypertension: Secondary | ICD-10-CM

## 2015-11-09 LAB — DIGOXIN LEVEL: Digoxin Level: 1.1 ng/mL (ref 0.8–2.0)

## 2015-11-09 NOTE — Assessment & Plan Note (Signed)
Blood pressure running low. Orthostatics done today with borderline low blood pressure He is not symptomatic We did discuss stopping benazepril but he would like to keep this for his diabetes Recommended he increase his fluid intake, monitor blood pressure at home take any with standing   Total encounter time more than 25 minutes  Greater than 50% was spent in counseling and coordination of care with the patient

## 2015-11-09 NOTE — Assessment & Plan Note (Signed)
Recent climb in his hemoglobin A1c. Recommended he stop drinking soda

## 2015-11-09 NOTE — Assessment & Plan Note (Signed)
He denies any symptoms concerning for atrial fibrillation. He would like to continue his current medication regiment. We did discuss possibly stopping the digoxin. He prefers to stay on this medication. We will check a digoxin level. Lab slip provided today

## 2015-11-09 NOTE — Patient Instructions (Signed)
You are doing well. No medication changes were made.  We will check digoxin level at your convenience   Monitor for symptoms of lightheadedness when standing   Please call us if you have new issues that need to be addressed before your next appt.  Your physician wants you to follow-up in: 6 months.  You will receive a reminder letter in the mail two months in advance. If you don't receive a letter, please call our office to schedule the follow-up appointment.

## 2015-11-09 NOTE — Progress Notes (Signed)
Patient ID: Timothy [name], male    DOB: 1929/01/02, 82 y.o.   MRN: [id number]  HPI Comments: Timothy [name] is a very pleasant 82 year old gentleman with a history of high cholesterol,  hypertension, diabetes, with no known coronary artery disease, patient of Timothy [name],  He has a 3.5 cm abdominal aortic aneurysm seen on ultrasound in 2014 He presents today for follow-up of his high cholesterol, PAD, paroxysmal atrial fibrillation He has mild to moderate aortic valve stenosis. Stress echo in early 2016 at [place] fibrillation recently seen on even monitor, started on anticoagulation and flecainide   In follow-up today, he presents with family Reports overall he is doing well, would prefer not to change any of his medications Denies any palpitations concerning for atrial fibrillation Was previously on torsemide daily, now only takes torsemide as needed for ankle edema He does drink significant soda daily. Family is concerned about low blood pressure. He denies any orthostatic symptoms.  Orthostatics were done in the office, blood pressure [id number] systolic, decreasing to [id number] systolic with standing, asymptomatic. He would like to stay on his benazepril given the benefit to his kidneys and underlying diabetes.  We did discuss recent climb in his hemoglobin A1c well above his baseline. Timothy [name] reports he drinks soda.  He is not doing much exercise, working with physical therapy Discussed his digoxin and whether it is needed.  EKG on today's visit shows normal sinus rhythm with rate 64 bpm, left anterior fascicular block  Other past medical history  recent back surgery at [facility] in June 2016 with postoperative complications including ileus, atrial fibrillation, congestive heart failure/swelling. He spent several weeks at rehabilitation.  In follow-up, was noted to have elevated heart rate 150 bpm, sent to the emergency room. Was found to have SVT. This broke on its own then developed atrial  fibrillation. He was given digoxin IV push for heart rate control in the setting of low blood pressure. Then converted to normal sinus rhythm.   12/14/2012 he had popliteal aneurysm surgery at [facility]. Other leg was done 05/21/2013.   total cholesterol 140, LDL 53, HDL 39, hemoglobin A1c 6.86  Previous echocardiogram and stress test  were essentially normal Echocardiogram showed normal LV function with systolic ejection fraction greater than 55%, evidence of diastolic relaxation abnormality, normal right ventricular systolic pressures, mild aortic valve regurgitation, borderline to mild aortic valve stenosis  Mild plaquing noted in the carotid bilaterally 3.7 cm abdominal aortic aneurysm    No Known Allergies  Current Outpatient Prescriptions on File Prior to Visit  Medication Sig Dispense Refill  . acetaminophen (TYLENOL) 500 MG tablet Take 1,000 mg by mouth every 8 (eight) hours as needed.     . benazepril (LOTENSIN) 5 MG tablet Take 1 tablet (5 mg total) by mouth daily. 30 tablet 6  . Calcium Citrate-Vitamin D (CALCIUM + D PO) Take 1 tablet by mouth daily.     . cyanocobalamin (,VITAMIN B-12,) 1000 MCG/ML injection Inject 1,000 mcg into the muscle every 30 (thirty) days. Pt uses on the 3rd of every month.    . digoxin (LANOXIN) 0.125 MG tablet Take 1 tablet (125 mcg total) by mouth daily. 90 tablet 3  . diltiazem (CARDIZEM) 30 MG tablet Take 1 tablet (30 mg total) by mouth 3 (three) times daily as needed. 90 tablet 3  . ELIQUIS 5 MG TABS tablet TAKE ONE TABLET TWICE DAILY 60 tablet 6  . empagliflozin (JARDIANCE) 25 MG TABS tablet Take 25 mg by  mouth daily. 30 tablet 12  . ferrous sulfate 325 (65 FE) MG tablet Take 325 mg by mouth daily.     . flecainide (TAMBOCOR) 50 MG tablet Take 1 tablet (50 mg total) by mouth 2 (two) times daily. 180 tablet 3  . glipiZIDE-metformin (METAGLIP) 5-500 MG tablet TAKE ONE TABLET TWICE DAILY 60 tablet 3  . Glucosamine-Chondroitin (GLUCOSAMINE  CHONDR COMPLEX PO) Take by mouth daily.    . hydrocortisone 2.5 % cream     . JANUVIA 100 MG tablet TAKE ONE TABLET BY MOUTH EVERY DAY 30 tablet 6  . metoprolol succinate (TOPROL-XL) 50 MG 24 hr tablet TAKE ONE TABLET BY MOUTH EVERY DAY 30 tablet 1  . Multiple Vitamins-Minerals (CEROVITE SENIOR PO) Take 1 tablet by mouth daily.    . pantoprazole (PROTONIX) 40 MG tablet TAKE ONE TABLET BY MOUTH TWICE DAILY 60 tablet 12  . Probiotic Product ([place]) Take by mouth.    . rosuvastatin (CRESTOR) 10 MG tablet TAKE ONE TABLET EVERY DAY 30 tablet 6  . torsemide (DEMADEX) 20 MG tablet Take 1 tablet (20 mg total) by mouth every morning. 30 tablet 3   No current facility-administered medications on file prior to visit.    Past Medical History  Diagnosis Date  . Back pain   . Esophageal reflux   . 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 Senile osteoporosis   . Skin cancer   . TIA (transient ischemic attack)   . CHF (congestive heart failure) [facility])     Past Surgical History  Procedure Laterality Date  . Gallbladder surgery    . Appendectomy    . Tonsillectomy    . Hernia repair    . Esophageal dilation    . Colonoscopy  [id number]  . Cataract extraction    . Retinal detachment surgery    . Back surgery      Social History  reports that he has never smoked. He has never used smokeless tobacco. He reports that he does not drink alcohol or use illicit drugs.  Family History family history includes Heart disease in his brother and mother; Stroke in his brother.  Review of Systems  Constitutional: Positive for fatigue.  Respiratory: Negative.   Cardiovascular: Negative.   Gastrointestinal: Negative.   Musculoskeletal: Positive for back pain, arthralgias and gait problem.  Neurological: Negative.   Hematological: Negative.   Psychiatric/Behavioral: Negative.   All other systems reviewed and are negative.   Ht 5\' 10"  (1.778 m)  Wt  180 lb (81.647 kg)  BMI 25.83 kg/m2  Physical Exam  Constitutional: He is oriented to person, place, and time. He appears well-developed and well-nourished.  HENT:  Head: Normocephalic.  Nose: Nose normal.  Mouth/Throat: Oropharynx is clear and moist.  Eyes: Conjunctivae are normal. Pupils are equal, round, and reactive to light.  Neck: Normal range of motion. Neck supple. No JVD present.  Cardiovascular: Normal rate, regular rhythm, S1 normal, S2 normal and intact distal pulses.  Exam reveals no gallop and no friction rub.   Murmur heard.  Systolic murmur is present with a grade of 2/6  Pulmonary/Chest: Effort normal and breath sounds normal. No respiratory distress. He has no wheezes. He has no rales. He exhibits no tenderness.  Abdominal: Soft. Bowel sounds are normal. He exhibits no distension. There is no tenderness.  Musculoskeletal: Normal range of motion. He exhibits no edema or tenderness.  Lymphadenopathy:    He has no  cervical adenopathy.  Neurological: He is alert and oriented to person, place, and time. Coordination normal.  Skin: Skin is warm and dry. No rash noted. No erythema.  Psychiatric: He has a normal mood and affect. His behavior is normal. Judgment and thought content normal.      Assessment and Plan   Nursing note and vitals reviewed.

## 2015-11-09 NOTE — Assessment & Plan Note (Signed)
No further episodes of SVT that he can appreciate. No changes to his medications. He does have diltiazem to take as needed for breakthrough arrhythmia

## 2015-11-27 ENCOUNTER — Ambulatory Visit (INDEPENDENT_AMBULATORY_CARE_PROVIDER_SITE_OTHER): Payer: Medicare Other | Admitting: Family Medicine

## 2015-11-27 ENCOUNTER — Encounter: Payer: Self-pay | Admitting: Family Medicine

## 2015-11-27 VITALS — BP 113/68 | HR 62 | Temp 98.0°F | Ht 70.0 in | Wt 177.0 lb

## 2015-11-27 DIAGNOSIS — I10 Essential (primary) hypertension: Secondary | ICD-10-CM | POA: Diagnosis not present

## 2015-11-27 DIAGNOSIS — R29898 Other symptoms and signs involving the musculoskeletal system: Secondary | ICD-10-CM | POA: Diagnosis not present

## 2015-11-27 DIAGNOSIS — E1159 Type 2 diabetes mellitus with other circulatory complications: Secondary | ICD-10-CM

## 2015-11-27 LAB — BAYER DCA HB A1C WAIVED: HB A1C (BAYER DCA - WAIVED): 7.8 % — ABNORMAL HIGH (ref <7.0)

## 2015-11-27 MED ORDER — GLIPIZIDE-METFORMIN HCL 5-500 MG PO TABS: 1.0000 | ORAL_TABLET | Freq: Two times a day (BID) | ORAL | Status: DC

## 2015-11-27 MED ORDER — ROSUVASTATIN CALCIUM 10 MG PO TABS: 10.0000 mg | ORAL_TABLET | Freq: Every day | ORAL | Status: DC

## 2015-11-27 MED ORDER — SITAGLIPTIN PHOSPHATE 100 MG PO TABS: 100.0000 mg | ORAL_TABLET | Freq: Every day | ORAL | Status: DC

## 2015-11-27 MED ORDER — METOPROLOL SUCCINATE ER 50 MG PO TB24: 50.0000 mg | ORAL_TABLET | Freq: Every day | ORAL | Status: DC

## 2015-11-27 MED ORDER — BENAZEPRIL HCL 5 MG PO TABS: 5.0000 mg | ORAL_TABLET | Freq: Every day | ORAL | Status: DC

## 2015-11-27 NOTE — Progress Notes (Signed)
BP 113/68 mmHg  Pulse 62  Temp(Src) 98 F (36.7 C)  Ht 5\' 10"  (1.778 m)  Wt 177 lb (80.287 kg)  BMI 25.40 kg/m2  SpO2 99%   Subjective:    Patient ID: Timothy [name], male    DOB: Oct 30, 1928, 82 y.o.   MRN: [id number]  HPI: [name] is a 82 y.o. male  Chief Complaint  Patient presents with  . Diabetes  . Hypertension  Recheck diabetes no complaints noted low blood sugar spells A1c improved to 7.8 patient taking medications without problems and faithfully. Blood pressure doing well with medications no complaints Taking blood thinner and medication without bleeding or bruising issues Biggest concern today is not driving and wants to be up to drive again discussed and gave connection for drivers rehabilitation valuation to be done   Relevant past medical, surgical, family and social history reviewed and updated as indicated. Interim medical history since our last visit reviewed. Allergies and medications reviewed and updated.  Review of Systems  Constitutional: Negative.   Respiratory: Negative.   Cardiovascular: Negative.     Per HPI unless specifically indicated above     Objective:    BP 113/68 mmHg  Pulse 62  Temp(Src) 98 F (36.7 C)  Ht 5\' 10"  (1.778 m)  Wt 177 lb (80.287 kg)  BMI 25.40 kg/m2  SpO2 99%  Wt Readings from Last 3 Encounters:  11/27/15 177 lb (80.287 kg)  11/09/15 180 lb (81.647 kg)  08/30/15 183 lb (83.008 kg)    Physical Exam  Constitutional: He is oriented to person, place, and time. He appears well-developed and well-nourished. No distress.  HENT:  Head: Normocephalic and atraumatic.  Right Ear: Hearing normal.  Left Ear: Hearing normal.  Nose: Nose normal.  Eyes: Conjunctivae and lids are normal. Right eye exhibits no discharge. Left eye exhibits no discharge. No scleral icterus.  Cardiovascular: Normal rate, regular rhythm and normal heart sounds.   Pulmonary/Chest: Effort normal and breath sounds normal. No respiratory distress.   Musculoskeletal: Normal range of motion.  Neurological: He is alert and oriented to person, place, and time.  Skin: Skin is intact. No rash noted.  Psychiatric: He has a normal mood and affect. His speech is normal and behavior is normal. Judgment and thought content normal. Cognition and memory are normal.    Results for orders placed or performed in visit on 11/27/15  Bayer DCA Hb A1c Waived  Result Value Ref Range   Bayer DCA Hb A1c Waived 7.8 (H) <7.0 %      Assessment & Plan:   Problem List Items Addressed This Visit      Cardiovascular and Mediastinum   Essential hypertension    The current medical regimen is effective;  continue present plan and medications.       Relevant Medications   benazepril (LOTENSIN) 5 MG tablet   metoprolol succinate (TOPROL-XL) 50 MG 24 hr tablet   rosuvastatin (CRESTOR) 10 MG tablet   Other Relevant Orders   Basic metabolic panel     Endocrine   DM type 2 (diabetes mellitus, type 2) ([place]) - Primary    The current medical regimen is effective;  continue present plan and medications.       Relevant Medications   benazepril (LOTENSIN) 5 MG tablet   glipiZIDE-metformin (METAGLIP) 5-500 MG tablet   sitaGLIPtin (JANUVIA) 100 MG tablet   rosuvastatin (CRESTOR) 10 MG tablet   Other Relevant Orders   Bayer DCA Hb A1c Waived (Completed)  Nervous and Auditory   Leg weakness, bilateral    Patient will do safe driving evaluation at driver rehabilitation          Follow up plan: Return in about 3 months (around 02/27/2016), or if symptoms worsen or fail to improve, for Physical Exam a1c.

## 2015-11-27 NOTE — Assessment & Plan Note (Signed)
The current medical regimen is effective;  continue present plan and medications.  

## 2015-11-27 NOTE — Assessment & Plan Note (Signed)
Patient will do safe driving evaluation at driver rehabilitation

## 2015-11-28 ENCOUNTER — Encounter: Payer: Self-pay | Admitting: Family Medicine

## 2015-11-28 LAB — BASIC METABOLIC PANEL
BUN/Creatinine Ratio: 17 (ref 10–24)
BUN: 25 mg/dL (ref 8–27)
CO2: 19 mmol/L (ref 18–29)
Calcium: 9.4 mg/dL (ref 8.6–10.2)
Chloride: 101 mmol/L (ref 96–106)
Creatinine, Ser: 1.47 mg/dL — ABNORMAL HIGH (ref 0.76–1.27)
GFR calc Af Amer: 50 mL/min/{1.73_m2} — ABNORMAL LOW (ref >59)
GFR calc non Af Amer: 43 mL/min/{1.73_m2} — ABNORMAL LOW (ref >59)
Glucose: 136 mg/dL — ABNORMAL HIGH (ref 65–99)
Potassium: 4.5 mmol/L (ref 3.5–5.2)
Sodium: 139 mmol/L (ref 134–144)

## 2015-12-04 ENCOUNTER — Other Ambulatory Visit: Payer: Self-pay | Admitting: Family Medicine

## 2015-12-04 DIAGNOSIS — L700 Acne vulgaris: Secondary | ICD-10-CM | POA: Diagnosis not present

## 2015-12-04 DIAGNOSIS — L820 Inflamed seborrheic keratosis: Secondary | ICD-10-CM | POA: Diagnosis not present

## 2015-12-04 DIAGNOSIS — L578 Other skin changes due to chronic exposure to nonionizing radiation: Secondary | ICD-10-CM | POA: Diagnosis not present

## 2015-12-04 DIAGNOSIS — L219 Seborrheic dermatitis, unspecified: Secondary | ICD-10-CM | POA: Diagnosis not present

## 2015-12-04 DIAGNOSIS — L570 Actinic keratosis: Secondary | ICD-10-CM | POA: Diagnosis not present

## 2015-12-04 DIAGNOSIS — I788 Other diseases of capillaries: Secondary | ICD-10-CM | POA: Diagnosis not present

## 2015-12-17 ENCOUNTER — Other Ambulatory Visit: Payer: Self-pay

## 2015-12-17 MED ORDER — FLECAINIDE ACETATE 50 MG PO TABS: 50.0000 mg | ORAL_TABLET | Freq: Two times a day (BID) | ORAL | Status: DC

## 2015-12-17 NOTE — Telephone Encounter (Signed)
Refill sent for Flecainide 50 mg

## 2015-12-26 DIAGNOSIS — R262 Difficulty in walking, not elsewhere classified: Secondary | ICD-10-CM | POA: Diagnosis not present

## 2015-12-26 DIAGNOSIS — M545 Low back pain: Secondary | ICD-10-CM | POA: Diagnosis not present

## 2015-12-26 DIAGNOSIS — G20 Parkinson's disease: Secondary | ICD-10-CM | POA: Diagnosis not present

## 2015-12-26 DIAGNOSIS — G8929 Other chronic pain: Secondary | ICD-10-CM | POA: Diagnosis not present

## 2015-12-28 DIAGNOSIS — I739 Peripheral vascular disease, unspecified: Secondary | ICD-10-CM | POA: Diagnosis not present

## 2015-12-28 DIAGNOSIS — M6281 Muscle weakness (generalized): Secondary | ICD-10-CM | POA: Diagnosis not present

## 2015-12-28 DIAGNOSIS — Z7984 Long term (current) use of oral hypoglycemic drugs: Secondary | ICD-10-CM | POA: Diagnosis not present

## 2015-12-28 DIAGNOSIS — Z8673 Personal history of transient ischemic attack (TIA), and cerebral infarction without residual deficits: Secondary | ICD-10-CM | POA: Diagnosis not present

## 2015-12-28 DIAGNOSIS — I10 Essential (primary) hypertension: Secondary | ICD-10-CM | POA: Diagnosis not present

## 2015-12-28 DIAGNOSIS — E119 Type 2 diabetes mellitus without complications: Secondary | ICD-10-CM | POA: Diagnosis not present

## 2015-12-28 DIAGNOSIS — R260 Ataxic gait: Secondary | ICD-10-CM | POA: Diagnosis not present

## 2015-12-28 DIAGNOSIS — G20 Parkinson's disease: Secondary | ICD-10-CM | POA: Diagnosis not present

## 2015-12-28 DIAGNOSIS — Z7982 Long term (current) use of aspirin: Secondary | ICD-10-CM | POA: Diagnosis not present

## 2015-12-28 DIAGNOSIS — M545 Low back pain: Secondary | ICD-10-CM | POA: Diagnosis not present

## 2016-01-01 DIAGNOSIS — I10 Essential (primary) hypertension: Secondary | ICD-10-CM | POA: Diagnosis not present

## 2016-01-01 DIAGNOSIS — M545 Low back pain: Secondary | ICD-10-CM | POA: Diagnosis not present

## 2016-01-01 DIAGNOSIS — R260 Ataxic gait: Secondary | ICD-10-CM | POA: Diagnosis not present

## 2016-01-01 DIAGNOSIS — G20 Parkinson's disease: Secondary | ICD-10-CM | POA: Diagnosis not present

## 2016-01-01 DIAGNOSIS — E119 Type 2 diabetes mellitus without complications: Secondary | ICD-10-CM | POA: Diagnosis not present

## 2016-01-01 DIAGNOSIS — I739 Peripheral vascular disease, unspecified: Secondary | ICD-10-CM | POA: Diagnosis not present

## 2016-01-04 ENCOUNTER — Telehealth: Payer: Self-pay

## 2016-01-04 DIAGNOSIS — G20 Parkinson's disease: Secondary | ICD-10-CM | POA: Diagnosis not present

## 2016-01-04 DIAGNOSIS — R260 Ataxic gait: Secondary | ICD-10-CM | POA: Diagnosis not present

## 2016-01-04 DIAGNOSIS — E119 Type 2 diabetes mellitus without complications: Secondary | ICD-10-CM | POA: Diagnosis not present

## 2016-01-04 DIAGNOSIS — M545 Low back pain: Secondary | ICD-10-CM | POA: Diagnosis not present

## 2016-01-04 DIAGNOSIS — I10 Essential (primary) hypertension: Secondary | ICD-10-CM | POA: Diagnosis not present

## 2016-01-04 DIAGNOSIS — I739 Peripheral vascular disease, unspecified: Secondary | ICD-10-CM | POA: Diagnosis not present

## 2016-01-04 NOTE — Telephone Encounter (Signed)
Per RL, spoke with patient's daughter.  Patient to stop iron pill, call if he has signs of anemia (fatigue, SOB, dizziness) and he can come in for CBC check prior to his scheduled appointment in August.  Re: msg from Dr. Lannie Fields office of interaction between Sinemet and Ferrous Sulfate

## 2016-01-09 DIAGNOSIS — I10 Essential (primary) hypertension: Secondary | ICD-10-CM | POA: Diagnosis not present

## 2016-01-09 DIAGNOSIS — G20 Parkinson's disease: Secondary | ICD-10-CM | POA: Diagnosis not present

## 2016-01-09 DIAGNOSIS — E119 Type 2 diabetes mellitus without complications: Secondary | ICD-10-CM | POA: Diagnosis not present

## 2016-01-09 DIAGNOSIS — M545 Low back pain: Secondary | ICD-10-CM | POA: Diagnosis not present

## 2016-01-09 DIAGNOSIS — R260 Ataxic gait: Secondary | ICD-10-CM | POA: Diagnosis not present

## 2016-01-09 DIAGNOSIS — I739 Peripheral vascular disease, unspecified: Secondary | ICD-10-CM | POA: Diagnosis not present

## 2016-01-11 DIAGNOSIS — I10 Essential (primary) hypertension: Secondary | ICD-10-CM | POA: Diagnosis not present

## 2016-01-11 DIAGNOSIS — I739 Peripheral vascular disease, unspecified: Secondary | ICD-10-CM | POA: Diagnosis not present

## 2016-01-11 DIAGNOSIS — R260 Ataxic gait: Secondary | ICD-10-CM | POA: Diagnosis not present

## 2016-01-11 DIAGNOSIS — G20 Parkinson's disease: Secondary | ICD-10-CM | POA: Diagnosis not present

## 2016-01-11 DIAGNOSIS — E119 Type 2 diabetes mellitus without complications: Secondary | ICD-10-CM | POA: Diagnosis not present

## 2016-01-11 DIAGNOSIS — M545 Low back pain: Secondary | ICD-10-CM | POA: Diagnosis not present

## 2016-01-14 DIAGNOSIS — M545 Low back pain: Secondary | ICD-10-CM | POA: Diagnosis not present

## 2016-01-14 DIAGNOSIS — I10 Essential (primary) hypertension: Secondary | ICD-10-CM | POA: Diagnosis not present

## 2016-01-14 DIAGNOSIS — G20 Parkinson's disease: Secondary | ICD-10-CM | POA: Diagnosis not present

## 2016-01-14 DIAGNOSIS — R260 Ataxic gait: Secondary | ICD-10-CM | POA: Diagnosis not present

## 2016-01-14 DIAGNOSIS — E119 Type 2 diabetes mellitus without complications: Secondary | ICD-10-CM | POA: Diagnosis not present

## 2016-01-14 DIAGNOSIS — I739 Peripheral vascular disease, unspecified: Secondary | ICD-10-CM | POA: Diagnosis not present

## 2016-01-16 DIAGNOSIS — I739 Peripheral vascular disease, unspecified: Secondary | ICD-10-CM | POA: Diagnosis not present

## 2016-01-16 DIAGNOSIS — E119 Type 2 diabetes mellitus without complications: Secondary | ICD-10-CM | POA: Diagnosis not present

## 2016-01-16 DIAGNOSIS — M545 Low back pain: Secondary | ICD-10-CM | POA: Diagnosis not present

## 2016-01-16 DIAGNOSIS — G20 Parkinson's disease: Secondary | ICD-10-CM | POA: Diagnosis not present

## 2016-01-16 DIAGNOSIS — I10 Essential (primary) hypertension: Secondary | ICD-10-CM | POA: Diagnosis not present

## 2016-01-16 DIAGNOSIS — R260 Ataxic gait: Secondary | ICD-10-CM | POA: Diagnosis not present

## 2016-01-21 DIAGNOSIS — E119 Type 2 diabetes mellitus without complications: Secondary | ICD-10-CM | POA: Diagnosis not present

## 2016-01-21 DIAGNOSIS — R260 Ataxic gait: Secondary | ICD-10-CM | POA: Diagnosis not present

## 2016-01-21 DIAGNOSIS — I10 Essential (primary) hypertension: Secondary | ICD-10-CM | POA: Diagnosis not present

## 2016-01-21 DIAGNOSIS — M545 Low back pain: Secondary | ICD-10-CM | POA: Diagnosis not present

## 2016-01-21 DIAGNOSIS — G20 Parkinson's disease: Secondary | ICD-10-CM | POA: Diagnosis not present

## 2016-01-21 DIAGNOSIS — I739 Peripheral vascular disease, unspecified: Secondary | ICD-10-CM | POA: Diagnosis not present

## 2016-01-23 DIAGNOSIS — I10 Essential (primary) hypertension: Secondary | ICD-10-CM | POA: Diagnosis not present

## 2016-01-23 DIAGNOSIS — G20 Parkinson's disease: Secondary | ICD-10-CM | POA: Diagnosis not present

## 2016-01-23 DIAGNOSIS — M545 Low back pain: Secondary | ICD-10-CM | POA: Diagnosis not present

## 2016-01-23 DIAGNOSIS — R260 Ataxic gait: Secondary | ICD-10-CM | POA: Diagnosis not present

## 2016-01-23 DIAGNOSIS — I739 Peripheral vascular disease, unspecified: Secondary | ICD-10-CM | POA: Diagnosis not present

## 2016-01-23 DIAGNOSIS — E119 Type 2 diabetes mellitus without complications: Secondary | ICD-10-CM | POA: Diagnosis not present

## 2016-01-30 ENCOUNTER — Telehealth: Payer: Self-pay | Admitting: Family Medicine

## 2016-01-30 NOTE — Telephone Encounter (Signed)
Returned patient's call, no answer, left VM to call back

## 2016-02-19 DIAGNOSIS — R262 Difficulty in walking, not elsewhere classified: Secondary | ICD-10-CM | POA: Diagnosis not present

## 2016-02-19 DIAGNOSIS — G20 Parkinson's disease: Secondary | ICD-10-CM | POA: Diagnosis not present

## 2016-02-21 ENCOUNTER — Encounter: Payer: Self-pay | Admitting: Family Medicine

## 2016-02-21 ENCOUNTER — Ambulatory Visit (INDEPENDENT_AMBULATORY_CARE_PROVIDER_SITE_OTHER): Payer: Medicare Other | Admitting: Family Medicine

## 2016-02-21 ENCOUNTER — Other Ambulatory Visit: Payer: Self-pay

## 2016-02-21 VITALS — BP 108/65 | HR 66 | Temp 98.1°F | Ht 70.0 in | Wt 177.8 lb

## 2016-02-21 DIAGNOSIS — I10 Essential (primary) hypertension: Secondary | ICD-10-CM | POA: Diagnosis not present

## 2016-02-21 DIAGNOSIS — D649 Anemia, unspecified: Secondary | ICD-10-CM | POA: Diagnosis not present

## 2016-02-21 DIAGNOSIS — E119 Type 2 diabetes mellitus without complications: Secondary | ICD-10-CM | POA: Diagnosis not present

## 2016-02-21 LAB — MICROALBUMIN, URINE WAIVED
Creatinine, Urine Waived: 50 mg/dL (ref 10–300)
Microalb, Ur Waived: 30 mg/L — ABNORMAL HIGH (ref 0–19)

## 2016-02-21 LAB — HEMOGLOBIN A1C: Hemoglobin A1C: 7.6

## 2016-02-21 LAB — BAYER DCA HB A1C WAIVED: HB A1C (BAYER DCA - WAIVED): 7.6 % — ABNORMAL HIGH (ref <7.0)

## 2016-02-21 NOTE — Assessment & Plan Note (Signed)
The current medical regimen is effective;  continue present plan and medications.  

## 2016-02-21 NOTE — Assessment & Plan Note (Signed)
No complaints will check labs

## 2016-02-21 NOTE — Progress Notes (Signed)
BP 108/65 (BP Location: Left Arm, Patient Position: Sitting, Cuff Size: Normal)   Pulse 66   Temp 98.1 F (36.7 C)   Ht 5\' 10"  (1.778 m)   Wt 177 lb 12.8 oz (80.6 kg) Comment: With Shoes  BMI 25.51 kg/m    Subjective:    Patient ID: Timothy [name], male    DOB: 1929/04/10, 82 y.o.   MRN: [id number]  HPI: Timothy [name] is a 82 y.o. male  Chief Complaint  Patient presents with  . Follow-up  . Anemia  . Diabetes   Patient doing well no complaints is concerned about iron as that was stopped when started on Cytomel by neurology patient wondering about iron status. Has otherwise been doing well. Diabetes doing well with good control still slightly elevated but better. No low blood sugar spells Blood pressure doing well No issues with Eliquis bleeding bruising etc. No falling issues Accompanied by his daughter who assists with history  Relevant past medical, surgical, family and social history reviewed and updated as indicated. Interim medical history since our last visit reviewed. Allergies and medications reviewed and updated.  Review of Systems  Constitutional: Negative.   Respiratory: Negative.   Cardiovascular: Negative.     Per HPI unless specifically indicated above     Objective:    BP 108/65 (BP Location: Left Arm, Patient Position: Sitting, Cuff Size: Normal)   Pulse 66   Temp 98.1 F (36.7 C)   Ht 5\' 10"  (1.778 m)   Wt 177 lb 12.8 oz (80.6 kg) Comment: With Shoes  BMI 25.51 kg/m   Wt Readings from Last 3 Encounters:  02/21/16 177 lb 12.8 oz (80.6 kg)  11/27/15 177 lb (80.3 kg)  11/09/15 180 lb (81.6 kg)    Physical Exam  Constitutional: He is oriented to person, place, and time. He appears well-developed and well-nourished. No distress.  HENT:  Head: Normocephalic and atraumatic.  Right Ear: Hearing normal.  Left Ear: Hearing normal.  Nose: Nose normal.  Eyes: Conjunctivae and lids are normal. Right eye exhibits no discharge. Left eye exhibits no  discharge. No scleral icterus.  Cardiovascular: Normal rate, regular rhythm and normal heart sounds.   Pulmonary/Chest: Effort normal and breath sounds normal. No respiratory distress.  Musculoskeletal: Normal range of motion.  Neurological: He is alert and oriented to person, place, and time.  Skin: Skin is intact. No rash noted.  Psychiatric: He has a normal mood and affect. His speech is normal and behavior is normal. Judgment and thought content normal. Cognition and memory are normal.    Results for orders placed or performed in visit on 02/21/16  Microalbumin, Urine Waived  Result Value Ref Range   Microalb, Ur Waived 30 (H) 0 - 19 mg/L   Creatinine, Urine Waived 50 10 - 300 mg/dL   Microalb/Creat Ratio 30-300 (H) <30 mg/g  Bayer DCA Hb A1c Waived  Result Value Ref Range   Bayer DCA Hb A1c Waived 7.6 (H) <7.0 %      Assessment & Plan:   Problem List Items Addressed This Visit      Cardiovascular and Mediastinum   Essential hypertension    The current medical regimen is effective;  continue present plan and medications.         Endocrine   DM type 2 (diabetes mellitus, type 2) ([place]) - Primary    The current medical regimen is effective;  continue present plan and medications.       Relevant Orders  Bayer DCA Hb A1c Waived (Completed)   Basic metabolic panel   Microalbumin, Urine Waived (Completed)     Other   Anemia    No complaints will check labs      Relevant Orders   CBC with Differential/Platelet   Ferritin    Other Visit Diagnoses    Anemia, unspecified       Relevant Orders   Iron Binding Cap (TIBC)      Patient with large corn on the bottom of his left foot which was pared with significant improvement in patient's symptoms  Follow up plan: Return in about 3 months (around 05/23/2016), or if symptoms worsen or fail to improve, for Physical Exam, Hemoglobin A1c.

## 2016-02-22 ENCOUNTER — Other Ambulatory Visit: Payer: Self-pay

## 2016-02-22 LAB — IRON AND TIBC
Iron Saturation: 17 % (ref 15–55)
Iron: 62 ug/dL (ref 38–169)
Total Iron Binding Capacity: 372 ug/dL (ref 250–450)
UIBC: 310 ug/dL (ref 111–343)

## 2016-02-22 LAB — CBC WITH DIFFERENTIAL/PLATELET
Basophils Absolute: 0.1 10*3/uL (ref 0.0–0.2)
Basos: 1 %
EOS (ABSOLUTE): 0.5 10*3/uL — ABNORMAL HIGH (ref 0.0–0.4)
Eos: 6 %
Hematocrit: 35.8 % — ABNORMAL LOW (ref 37.5–51.0)
Hemoglobin: 11.6 g/dL — ABNORMAL LOW (ref 12.6–17.7)
Immature Grans (Abs): 0.1 10*3/uL (ref 0.0–0.1)
Immature Granulocytes: 1 %
Lymphocytes Absolute: 2.4 10*3/uL (ref 0.7–3.1)
Lymphs: 29 %
MCH: 30.5 pg (ref 26.6–33.0)
MCHC: 32.4 g/dL (ref 31.5–35.7)
MCV: 94 fL (ref 79–97)
Monocytes Absolute: 1 10*3/uL — ABNORMAL HIGH (ref 0.1–0.9)
Monocytes: 12 %
Neutrophils Absolute: 4.1 10*3/uL (ref 1.4–7.0)
Neutrophils: 51 %
Platelets: 203 10*3/uL (ref 150–379)
RBC: 3.8 x10E6/uL — ABNORMAL LOW (ref 4.14–5.80)
RDW: 14 % (ref 12.3–15.4)
WBC: 8 10*3/uL (ref 3.4–10.8)

## 2016-02-22 LAB — BASIC METABOLIC PANEL
BUN/Creatinine Ratio: 16 (ref 10–24)
BUN: 21 mg/dL (ref 8–27)
CO2: 18 mmol/L (ref 18–29)
Calcium: 9.7 mg/dL (ref 8.6–10.2)
Chloride: 99 mmol/L (ref 96–106)
Creatinine, Ser: 1.29 mg/dL — ABNORMAL HIGH (ref 0.76–1.27)
GFR calc Af Amer: 58 mL/min/{1.73_m2} — ABNORMAL LOW (ref >59)
GFR calc non Af Amer: 51 mL/min/{1.73_m2} — ABNORMAL LOW (ref >59)
Glucose: 194 mg/dL — ABNORMAL HIGH (ref 65–99)
Potassium: 4.5 mmol/L (ref 3.5–5.2)
Sodium: 137 mmol/L (ref 134–144)

## 2016-02-22 LAB — FERRITIN: Ferritin: 78 ng/mL (ref 30–400)

## 2016-02-26 ENCOUNTER — Encounter: Payer: Self-pay | Admitting: Family Medicine

## 2016-02-26 ENCOUNTER — Other Ambulatory Visit: Payer: Self-pay | Admitting: Family Medicine

## 2016-02-27 ENCOUNTER — Other Ambulatory Visit: Payer: Self-pay | Admitting: Family Medicine

## 2016-02-27 MED ORDER — APIXABAN 5 MG PO TABS: 5.0000 mg | ORAL_TABLET | Freq: Two times a day (BID) | ORAL | 6 refills | Status: DC

## 2016-03-21 ENCOUNTER — Other Ambulatory Visit: Payer: Self-pay | Admitting: *Deleted

## 2016-03-21 MED ORDER — DIGOXIN 125 MCG PO TABS: 125.0000 ug | ORAL_TABLET | Freq: Every day | ORAL | 3 refills | Status: DC

## 2016-03-27 ENCOUNTER — Other Ambulatory Visit: Payer: Self-pay | Admitting: Family Medicine

## 2016-03-27 DIAGNOSIS — I10 Essential (primary) hypertension: Secondary | ICD-10-CM

## 2016-05-08 ENCOUNTER — Telehealth: Payer: Self-pay | Admitting: Family Medicine

## 2016-05-08 ENCOUNTER — Other Ambulatory Visit: Payer: Self-pay | Admitting: Family Medicine

## 2016-05-08 NOTE — Telephone Encounter (Signed)
Routing to provider  

## 2016-05-08 NOTE — Telephone Encounter (Signed)
Pt's daughter called stated she will need either more samples of Eloquis or a different medication as the patient can not afford the medication and it is not covered by patients insurance. Please contact pt's daughter [name] to follow up. Thanks.

## 2016-05-09 NOTE — Telephone Encounter (Signed)
Routing to provider  

## 2016-05-12 NOTE — Telephone Encounter (Signed)
Patient's daughter will call and check to see what is covered.

## 2016-05-12 NOTE — Telephone Encounter (Signed)
Call the daughter Ask her to ask the pharmacy for alternatives. There are multiple similar drugs that may be covered better by insurance.

## 2016-05-15 DIAGNOSIS — M79672 Pain in left foot: Secondary | ICD-10-CM | POA: Diagnosis not present

## 2016-05-15 DIAGNOSIS — M79671 Pain in right foot: Secondary | ICD-10-CM | POA: Diagnosis not present

## 2016-05-15 DIAGNOSIS — L851 Acquired keratosis [keratoderma] palmaris et plantaris: Secondary | ICD-10-CM | POA: Diagnosis not present

## 2016-05-15 DIAGNOSIS — B351 Tinea unguium: Secondary | ICD-10-CM | POA: Diagnosis not present

## 2016-05-15 DIAGNOSIS — E1151 Type 2 diabetes mellitus with diabetic peripheral angiopathy without gangrene: Secondary | ICD-10-CM | POA: Diagnosis not present

## 2016-05-16 ENCOUNTER — Encounter: Payer: Self-pay | Admitting: Cardiovascular Disease

## 2016-05-16 ENCOUNTER — Ambulatory Visit (INDEPENDENT_AMBULATORY_CARE_PROVIDER_SITE_OTHER): Payer: Medicare Other | Admitting: Cardiovascular Disease

## 2016-05-16 VITALS — BP 122/66 | HR 66 | Ht 69.5 in | Wt 178.5 lb

## 2016-05-16 DIAGNOSIS — E1159 Type 2 diabetes mellitus with other circulatory complications: Secondary | ICD-10-CM | POA: Diagnosis not present

## 2016-05-16 DIAGNOSIS — G20A1 Parkinson's disease without dyskinesia, without mention of fluctuations: Secondary | ICD-10-CM

## 2016-05-16 DIAGNOSIS — I714 Abdominal aortic aneurysm, without rupture, unspecified: Secondary | ICD-10-CM

## 2016-05-16 DIAGNOSIS — I480 Paroxysmal atrial fibrillation: Secondary | ICD-10-CM | POA: Diagnosis not present

## 2016-05-16 DIAGNOSIS — I10 Essential (primary) hypertension: Secondary | ICD-10-CM

## 2016-05-16 DIAGNOSIS — I739 Peripheral vascular disease, unspecified: Secondary | ICD-10-CM

## 2016-05-16 DIAGNOSIS — G20 Parkinson's disease: Secondary | ICD-10-CM

## 2016-05-16 NOTE — Progress Notes (Signed)
Cardiology Office Note  Date:  05/16/2016   ID:  Timothy [name], DOB 12-05-1928, MRN [id number]  PCP:  Timothy Pop, MD   Chief Complaint  Patient presents with  . other    6 month f/u. Meds reviewed verbally with pt.    HPI:  [name] is a very pleasant 82 year old gentleman with a history of high cholesterol,  hypertension, diabetes, with no known coronary artery disease, patient of Dr. Golden [name],  He has a 3.5 cm abdominal aortic aneurysm seen on ultrasound in 2014 He presents today for follow-up of his high cholesterol, PAD, paroxysmal atrial fibrillation He has mild to moderate aortic valve stenosis. Stress echo in early 2016 at [place] fibrillation recently seen on even monitor, started on anticoagulation and flecainide bilateral popliteal artery aneurysms, B/l popliteal bypass in 12/2012 and 05/2013 for 1.7 cm aneurysm popliteal US aorta 09/2012: 3.7 cm U/s 05/2015: LE arterial U/S  In follow-up he reports that he feels well, His wife is in rehabilitation Denies any falls, walks with a walker Appetite is good, denies any shortness of breath, leg edema Denies any palpitations concerning for atrial fibrillation Recently diagnosed with Parkinson's Currently on Sinemet 3 times a day  Lab work reviewed HBA1C 7.6 two months ago Previously was drinking soda  EKG on today's visit shows normal sinus rhythm with rate 66 bpm, poor R-wave through the anterior precordial leads, left anterior fascicular block  Other past medical history  takes torsemide as needed for ankle edema  He is not doing much exercise, working with physical therapy   recent back surgery at [facility] in June 2016 with postoperative complications including ileus, atrial fibrillation, congestive heart failure/swelling. He spent several weeks at rehabilitation.  In follow-up, was noted to have elevated heart rate 150 bpm, sent to the emergency room. Was found to have SVT. This broke on its own then  developed atrial fibrillation. He was given digoxin IV push for heart rate control in the setting of low blood pressure. Then converted to normal sinus rhythm.   12/14/2012 he had popliteal aneurysm surgery at [facility]. Other leg was done 05/21/2013.   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  PSH:    Past Surgical History:  Procedure Laterality Date  . APPENDECTOMY    . BACK SURGERY    . CATARACT EXTRACTION    . COLONOSCOPY  [id number]  . ESOPHAGEAL DILATION    . GALLBLADDER SURGERY    . HERNIA REPAIR    . RETINAL DETACHMENT SURGERY    . TONSILLECTOMY      Current Outpatient Prescriptions  Medication Sig Dispense Refill  . acetaminophen (TYLENOL) 500 MG tablet Take 1,000 mg by mouth every 8 (eight) hours as needed.     Marland Kitchen apixaban (ELIQUIS) 5 MG TABS tablet Take 1 tablet (5 mg total) by mouth 2 (two) times daily. 60 tablet 6  . benazepril (LOTENSIN) 5 MG tablet Take 1 tablet (5 mg total) by mouth daily. [place]  tablet 6  . Calcium Citrate-Vitamin D (CALCIUM + D PO) Take 1 tablet by mouth daily.     . Carbidopa-Levodopa (SINEMET PO) Take 100 mg by mouth 3 (three) times daily.    . cyanocobalamin (,VITAMIN B-12,) 1000 MCG/ML injection INJECT 1ML EVERY MONTH 1 mL 12  . digoxin (LANOXIN) 0.125 MG tablet Take 1 tablet (125 mcg total) by mouth daily. 90 tablet 3  . diltiazem  (CARDIZEM) 30 MG tablet Take 1 tablet (30 mg total) by mouth 3 (three) times daily as needed. 90 tablet 3  . empagliflozin (JARDIANCE) 25 MG TABS tablet Take 25 mg by mouth daily. 30 tablet 12  . flecainide (TAMBOCOR) 50 MG tablet Take 1 tablet (50 mg total) by mouth 2 (two) times daily. 180 tablet 3  . glipiZIDE-metformin (METAGLIP) 5-500 MG tablet TAKE 1 TABLET BY MOUTH TWICE DAILY 60 tablet 0  . GLUCOSAMINE-CHONDROITIN-VIT D3 PO Take by mouth daily.    . hydrocortisone 2.5 % cream     . metoprolol succinate (TOPROL-XL) 50 MG 24 hr tablet Take 1 tablet (50 mg total) by mouth daily. Take with or immediately following a meal. 30 tablet 4  . Multiple Vitamins-Minerals (CEROVITE SENIOR PO) Take 1 tablet by mouth daily.    . pantoprazole (PROTONIX) 40 MG tablet TAKE ONE TABLET BY MOUTH TWICE DAILY 60 tablet 12  . Probiotic Product ([place]) Take by mouth.    . rosuvastatin (CRESTOR) 10 MG tablet Take 1 tablet (10 mg total) by mouth daily. 30 tablet 6  . sitaGLIPtin (JANUVIA) 100 MG tablet Take 1 tablet (100 mg total) by mouth daily. 30 tablet 6   No current facility-administered medications for this visit.      Allergies:   Patient has no known allergies.   Social History:  The patient  reports that he has never smoked. He has never used smokeless tobacco. He reports that he does not drink alcohol or use drugs.   Family History:   family history includes Heart disease in his brother and mother; Stroke in his brother.    Review of Systems: Review of Systems  Constitutional: Negative.   Respiratory: Negative.   Cardiovascular: Negative.   Gastrointestinal: Negative.   Musculoskeletal:       Unsteady gait  Neurological: Negative.   Psychiatric/Behavioral: Negative.   All other systems reviewed and are negative.    PHYSICAL EXAM: VS:  BP 122/66 (BP Location: Left Arm, Patient Position: Sitting, Cuff Size: Normal)   Pulse 66   Ht 5' 9.5" (1.765 m)   Wt 178 lb 8 oz  (81 kg)   BMI 25.98 kg/m  , BMI Body mass index is 25.98 kg/m. GEN: Well nourished, well developed, in no acute distress , presents with a cane HEENT: normal  Neck: no JVD, carotid bruits, or masses Cardiac: RRR; no murmurs, rubs, or gallops,no edema  Respiratory:  clear to auscultation bilaterally, normal work of breathing GI: soft, nontender, nondistended, + BS MS: no deformity or atrophy  Skin: warm and dry, no rash Neuro:  Strength and sensation are intact Psych: euthymic mood, full affect    Recent Labs: 02/21/2016: BUN 21; Creatinine, Ser 1.29; Platelets 203; Potassium 4.5; Sodium 137    Lipid Panel Lab Results  Component Value Date   CHOL 128 05/28/2015   HDL 27 (L) 05/28/2015   LDLCALC Comment 05/28/2015   TRIG 403 (H) 05/28/2015      Wt Readings from Last 3 Encounters:  05/16/16 178 lb 8  oz (81 kg)  02/21/16 177 lb 12.8 oz (80.6 kg)  11/27/15 177 lb (80.3 kg)       ASSESSMENT AND PLAN:  Paroxysmal atrial fibrillation (HCC) - Plan: EKG 12-Lead Maintaining normal sinus rhythm Tolerating anticoagulation No changes to his medications, continue beta blocker  AAA (abdominal aortic aneurysm) without rupture (HCC) Mildly dilated aneurysm, Will need periodic ultrasound. Last done November 2016 at [place] [facility] Blood pressure is well controlled on today's visit. No changes made to the medications.  Type 2 diabetes mellitus with other circulatory complication, without long-term current use of insulin ([place]) We have encouraged continued exercise, careful diet management in an effort to lose weight. Previously was drinking soda  Parkinson's disease ([place]) Long discussion concerning his recent diagnosis, Sinemet, need to exercise  Peripheral arterial disease ([place]) Bilateral popliteal aneurysm, had bilateral bypass surgery at [facility] Previous surgical details discussed with him   Total encounter time more than 25 minutes  Greater than 50% was spent  in counseling and coordination of care with the patient   Disposition:   F/U  6 months   Orders Placed This Encounter  Procedures  . EKG 12-Lead     Signed, Esmond Plants, M.D., Ph.D. 05/16/2016  [place], [place]

## 2016-05-16 NOTE — Patient Instructions (Signed)

## 2016-05-27 DIAGNOSIS — Z23 Encounter for immunization: Secondary | ICD-10-CM | POA: Diagnosis not present

## 2016-05-28 ENCOUNTER — Ambulatory Visit: Payer: Medicare Other | Admitting: Family Medicine

## 2016-06-06 ENCOUNTER — Other Ambulatory Visit: Payer: Self-pay | Admitting: Family Medicine

## 2016-06-09 NOTE — Telephone Encounter (Signed)
Routing to provider  

## 2016-06-10 ENCOUNTER — Other Ambulatory Visit: Payer: Self-pay | Admitting: Family Medicine

## 2016-06-10 NOTE — Telephone Encounter (Signed)
Routing to provider  

## 2016-06-20 ENCOUNTER — Other Ambulatory Visit: Payer: Self-pay | Admitting: Family Medicine

## 2016-07-04 DIAGNOSIS — H35372 Puckering of macula, left eye: Secondary | ICD-10-CM | POA: Diagnosis not present

## 2016-07-04 LAB — HM DIABETES EYE EXAM

## 2016-07-29 DIAGNOSIS — R262 Difficulty in walking, not elsewhere classified: Secondary | ICD-10-CM | POA: Diagnosis not present

## 2016-07-29 DIAGNOSIS — G20 Parkinson's disease: Secondary | ICD-10-CM | POA: Diagnosis not present

## 2016-08-01 ENCOUNTER — Telehealth: Payer: Self-pay | Admitting: Family Medicine

## 2016-08-02 DIAGNOSIS — G20 Parkinson's disease: Secondary | ICD-10-CM | POA: Diagnosis not present

## 2016-08-02 DIAGNOSIS — Z7901 Long term (current) use of anticoagulants: Secondary | ICD-10-CM | POA: Diagnosis not present

## 2016-08-02 DIAGNOSIS — Z8673 Personal history of transient ischemic attack (TIA), and cerebral infarction without residual deficits: Secondary | ICD-10-CM | POA: Diagnosis not present

## 2016-08-02 DIAGNOSIS — E1151 Type 2 diabetes mellitus with diabetic peripheral angiopathy without gangrene: Secondary | ICD-10-CM | POA: Diagnosis not present

## 2016-08-02 DIAGNOSIS — I10 Essential (primary) hypertension: Secondary | ICD-10-CM | POA: Diagnosis not present

## 2016-08-02 DIAGNOSIS — Z7984 Long term (current) use of oral hypoglycemic drugs: Secondary | ICD-10-CM | POA: Diagnosis not present

## 2016-08-04 MED ORDER — CANAGLIFLOZIN 300 MG PO TABS: 300.0000 mg | ORAL_TABLET | Freq: Every day | ORAL | 3 refills | Status: DC

## 2016-08-04 NOTE — Telephone Encounter (Signed)
Pt requesting Jardiance be switched to invokana due to insurance reasons. Please advise.

## 2016-08-04 NOTE — Telephone Encounter (Signed)
rx sent

## 2016-08-06 DIAGNOSIS — Z8673 Personal history of transient ischemic attack (TIA), and cerebral infarction without residual deficits: Secondary | ICD-10-CM | POA: Diagnosis not present

## 2016-08-06 DIAGNOSIS — I10 Essential (primary) hypertension: Secondary | ICD-10-CM | POA: Diagnosis not present

## 2016-08-06 DIAGNOSIS — Z7901 Long term (current) use of anticoagulants: Secondary | ICD-10-CM | POA: Diagnosis not present

## 2016-08-06 DIAGNOSIS — Z7984 Long term (current) use of oral hypoglycemic drugs: Secondary | ICD-10-CM | POA: Diagnosis not present

## 2016-08-06 DIAGNOSIS — G20 Parkinson's disease: Secondary | ICD-10-CM | POA: Diagnosis not present

## 2016-08-06 DIAGNOSIS — E1151 Type 2 diabetes mellitus with diabetic peripheral angiopathy without gangrene: Secondary | ICD-10-CM | POA: Diagnosis not present

## 2016-08-08 DIAGNOSIS — I10 Essential (primary) hypertension: Secondary | ICD-10-CM | POA: Diagnosis not present

## 2016-08-08 DIAGNOSIS — Z7984 Long term (current) use of oral hypoglycemic drugs: Secondary | ICD-10-CM | POA: Diagnosis not present

## 2016-08-08 DIAGNOSIS — E1151 Type 2 diabetes mellitus with diabetic peripheral angiopathy without gangrene: Secondary | ICD-10-CM | POA: Diagnosis not present

## 2016-08-08 DIAGNOSIS — G20 Parkinson's disease: Secondary | ICD-10-CM | POA: Diagnosis not present

## 2016-08-08 DIAGNOSIS — Z8673 Personal history of transient ischemic attack (TIA), and cerebral infarction without residual deficits: Secondary | ICD-10-CM | POA: Diagnosis not present

## 2016-08-08 DIAGNOSIS — Z7901 Long term (current) use of anticoagulants: Secondary | ICD-10-CM | POA: Diagnosis not present

## 2016-08-11 DIAGNOSIS — Z8673 Personal history of transient ischemic attack (TIA), and cerebral infarction without residual deficits: Secondary | ICD-10-CM | POA: Diagnosis not present

## 2016-08-11 DIAGNOSIS — G20 Parkinson's disease: Secondary | ICD-10-CM | POA: Diagnosis not present

## 2016-08-11 DIAGNOSIS — E1151 Type 2 diabetes mellitus with diabetic peripheral angiopathy without gangrene: Secondary | ICD-10-CM | POA: Diagnosis not present

## 2016-08-11 DIAGNOSIS — Z7901 Long term (current) use of anticoagulants: Secondary | ICD-10-CM | POA: Diagnosis not present

## 2016-08-11 DIAGNOSIS — Z7984 Long term (current) use of oral hypoglycemic drugs: Secondary | ICD-10-CM | POA: Diagnosis not present

## 2016-08-11 DIAGNOSIS — I10 Essential (primary) hypertension: Secondary | ICD-10-CM | POA: Diagnosis not present

## 2016-08-12 ENCOUNTER — Telehealth: Payer: Self-pay | Admitting: Family Medicine

## 2016-08-12 DIAGNOSIS — E1159 Type 2 diabetes mellitus with other circulatory complications: Secondary | ICD-10-CM

## 2016-08-12 MED ORDER — DAPAGLIFLOZIN PROPANEDIOL 10 MG PO TABS: 10.0000 mg | ORAL_TABLET | Freq: Every day | ORAL | 2 refills | Status: DC

## 2016-08-12 NOTE — Telephone Encounter (Signed)
Call daughter

## 2016-08-13 DIAGNOSIS — E1151 Type 2 diabetes mellitus with diabetic peripheral angiopathy without gangrene: Secondary | ICD-10-CM | POA: Diagnosis not present

## 2016-08-13 DIAGNOSIS — Z7984 Long term (current) use of oral hypoglycemic drugs: Secondary | ICD-10-CM | POA: Diagnosis not present

## 2016-08-13 DIAGNOSIS — G20 Parkinson's disease: Secondary | ICD-10-CM | POA: Diagnosis not present

## 2016-08-13 DIAGNOSIS — Z7901 Long term (current) use of anticoagulants: Secondary | ICD-10-CM | POA: Diagnosis not present

## 2016-08-13 DIAGNOSIS — Z8673 Personal history of transient ischemic attack (TIA), and cerebral infarction without residual deficits: Secondary | ICD-10-CM | POA: Diagnosis not present

## 2016-08-13 DIAGNOSIS — I10 Essential (primary) hypertension: Secondary | ICD-10-CM | POA: Diagnosis not present

## 2016-08-14 DIAGNOSIS — Z7984 Long term (current) use of oral hypoglycemic drugs: Secondary | ICD-10-CM | POA: Diagnosis not present

## 2016-08-14 DIAGNOSIS — Z8673 Personal history of transient ischemic attack (TIA), and cerebral infarction without residual deficits: Secondary | ICD-10-CM | POA: Diagnosis not present

## 2016-08-14 DIAGNOSIS — E1151 Type 2 diabetes mellitus with diabetic peripheral angiopathy without gangrene: Secondary | ICD-10-CM | POA: Diagnosis not present

## 2016-08-14 DIAGNOSIS — I10 Essential (primary) hypertension: Secondary | ICD-10-CM | POA: Diagnosis not present

## 2016-08-14 DIAGNOSIS — Z7901 Long term (current) use of anticoagulants: Secondary | ICD-10-CM | POA: Diagnosis not present

## 2016-08-14 DIAGNOSIS — G20 Parkinson's disease: Secondary | ICD-10-CM | POA: Diagnosis not present

## 2016-08-15 DIAGNOSIS — Z8673 Personal history of transient ischemic attack (TIA), and cerebral infarction without residual deficits: Secondary | ICD-10-CM | POA: Diagnosis not present

## 2016-08-15 DIAGNOSIS — Z7901 Long term (current) use of anticoagulants: Secondary | ICD-10-CM | POA: Diagnosis not present

## 2016-08-15 DIAGNOSIS — E1151 Type 2 diabetes mellitus with diabetic peripheral angiopathy without gangrene: Secondary | ICD-10-CM | POA: Diagnosis not present

## 2016-08-15 DIAGNOSIS — I10 Essential (primary) hypertension: Secondary | ICD-10-CM | POA: Diagnosis not present

## 2016-08-15 DIAGNOSIS — Z7984 Long term (current) use of oral hypoglycemic drugs: Secondary | ICD-10-CM | POA: Diagnosis not present

## 2016-08-15 DIAGNOSIS — G20 Parkinson's disease: Secondary | ICD-10-CM | POA: Diagnosis not present

## 2016-08-18 DIAGNOSIS — Z7984 Long term (current) use of oral hypoglycemic drugs: Secondary | ICD-10-CM | POA: Diagnosis not present

## 2016-08-18 DIAGNOSIS — G20 Parkinson's disease: Secondary | ICD-10-CM | POA: Diagnosis not present

## 2016-08-18 DIAGNOSIS — I10 Essential (primary) hypertension: Secondary | ICD-10-CM | POA: Diagnosis not present

## 2016-08-18 DIAGNOSIS — E1151 Type 2 diabetes mellitus with diabetic peripheral angiopathy without gangrene: Secondary | ICD-10-CM | POA: Diagnosis not present

## 2016-08-18 DIAGNOSIS — Z7901 Long term (current) use of anticoagulants: Secondary | ICD-10-CM | POA: Diagnosis not present

## 2016-08-18 DIAGNOSIS — Z8673 Personal history of transient ischemic attack (TIA), and cerebral infarction without residual deficits: Secondary | ICD-10-CM | POA: Diagnosis not present

## 2016-08-19 DIAGNOSIS — Z8673 Personal history of transient ischemic attack (TIA), and cerebral infarction without residual deficits: Secondary | ICD-10-CM | POA: Diagnosis not present

## 2016-08-19 DIAGNOSIS — Z7984 Long term (current) use of oral hypoglycemic drugs: Secondary | ICD-10-CM | POA: Diagnosis not present

## 2016-08-19 DIAGNOSIS — G20 Parkinson's disease: Secondary | ICD-10-CM | POA: Diagnosis not present

## 2016-08-19 DIAGNOSIS — Z7901 Long term (current) use of anticoagulants: Secondary | ICD-10-CM | POA: Diagnosis not present

## 2016-08-19 DIAGNOSIS — E1151 Type 2 diabetes mellitus with diabetic peripheral angiopathy without gangrene: Secondary | ICD-10-CM | POA: Diagnosis not present

## 2016-08-19 DIAGNOSIS — I10 Essential (primary) hypertension: Secondary | ICD-10-CM | POA: Diagnosis not present

## 2016-08-20 DIAGNOSIS — I10 Essential (primary) hypertension: Secondary | ICD-10-CM | POA: Diagnosis not present

## 2016-08-20 DIAGNOSIS — Z7984 Long term (current) use of oral hypoglycemic drugs: Secondary | ICD-10-CM | POA: Diagnosis not present

## 2016-08-20 DIAGNOSIS — Z8673 Personal history of transient ischemic attack (TIA), and cerebral infarction without residual deficits: Secondary | ICD-10-CM | POA: Diagnosis not present

## 2016-08-20 DIAGNOSIS — Z7901 Long term (current) use of anticoagulants: Secondary | ICD-10-CM | POA: Diagnosis not present

## 2016-08-20 DIAGNOSIS — G20 Parkinson's disease: Secondary | ICD-10-CM | POA: Diagnosis not present

## 2016-08-20 DIAGNOSIS — E1151 Type 2 diabetes mellitus with diabetic peripheral angiopathy without gangrene: Secondary | ICD-10-CM | POA: Diagnosis not present

## 2016-08-21 DIAGNOSIS — Z7984 Long term (current) use of oral hypoglycemic drugs: Secondary | ICD-10-CM | POA: Diagnosis not present

## 2016-08-21 DIAGNOSIS — Z7901 Long term (current) use of anticoagulants: Secondary | ICD-10-CM | POA: Diagnosis not present

## 2016-08-21 DIAGNOSIS — Z8673 Personal history of transient ischemic attack (TIA), and cerebral infarction without residual deficits: Secondary | ICD-10-CM | POA: Diagnosis not present

## 2016-08-21 DIAGNOSIS — I10 Essential (primary) hypertension: Secondary | ICD-10-CM | POA: Diagnosis not present

## 2016-08-21 DIAGNOSIS — G20 Parkinson's disease: Secondary | ICD-10-CM | POA: Diagnosis not present

## 2016-08-21 DIAGNOSIS — E1151 Type 2 diabetes mellitus with diabetic peripheral angiopathy without gangrene: Secondary | ICD-10-CM | POA: Diagnosis not present

## 2016-08-26 DIAGNOSIS — G20 Parkinson's disease: Secondary | ICD-10-CM | POA: Diagnosis not present

## 2016-08-26 DIAGNOSIS — Z8673 Personal history of transient ischemic attack (TIA), and cerebral infarction without residual deficits: Secondary | ICD-10-CM | POA: Diagnosis not present

## 2016-08-26 DIAGNOSIS — Z7984 Long term (current) use of oral hypoglycemic drugs: Secondary | ICD-10-CM | POA: Diagnosis not present

## 2016-08-26 DIAGNOSIS — I10 Essential (primary) hypertension: Secondary | ICD-10-CM | POA: Diagnosis not present

## 2016-08-26 DIAGNOSIS — E1151 Type 2 diabetes mellitus with diabetic peripheral angiopathy without gangrene: Secondary | ICD-10-CM | POA: Diagnosis not present

## 2016-08-26 DIAGNOSIS — Z7901 Long term (current) use of anticoagulants: Secondary | ICD-10-CM | POA: Diagnosis not present

## 2016-08-27 DIAGNOSIS — G20 Parkinson's disease: Secondary | ICD-10-CM | POA: Diagnosis not present

## 2016-08-27 DIAGNOSIS — E1151 Type 2 diabetes mellitus with diabetic peripheral angiopathy without gangrene: Secondary | ICD-10-CM | POA: Diagnosis not present

## 2016-08-27 DIAGNOSIS — Z7901 Long term (current) use of anticoagulants: Secondary | ICD-10-CM | POA: Diagnosis not present

## 2016-08-27 DIAGNOSIS — Z7984 Long term (current) use of oral hypoglycemic drugs: Secondary | ICD-10-CM | POA: Diagnosis not present

## 2016-08-27 DIAGNOSIS — Z8673 Personal history of transient ischemic attack (TIA), and cerebral infarction without residual deficits: Secondary | ICD-10-CM | POA: Diagnosis not present

## 2016-08-27 DIAGNOSIS — I10 Essential (primary) hypertension: Secondary | ICD-10-CM | POA: Diagnosis not present

## 2016-08-29 DIAGNOSIS — Z7984 Long term (current) use of oral hypoglycemic drugs: Secondary | ICD-10-CM | POA: Diagnosis not present

## 2016-08-29 DIAGNOSIS — Z8673 Personal history of transient ischemic attack (TIA), and cerebral infarction without residual deficits: Secondary | ICD-10-CM | POA: Diagnosis not present

## 2016-08-29 DIAGNOSIS — E1151 Type 2 diabetes mellitus with diabetic peripheral angiopathy without gangrene: Secondary | ICD-10-CM | POA: Diagnosis not present

## 2016-08-29 DIAGNOSIS — Z7901 Long term (current) use of anticoagulants: Secondary | ICD-10-CM | POA: Diagnosis not present

## 2016-08-29 DIAGNOSIS — I10 Essential (primary) hypertension: Secondary | ICD-10-CM | POA: Diagnosis not present

## 2016-08-29 DIAGNOSIS — G20 Parkinson's disease: Secondary | ICD-10-CM | POA: Diagnosis not present

## 2016-08-30 ENCOUNTER — Other Ambulatory Visit: Payer: Self-pay | Admitting: Family Medicine

## 2016-09-01 NOTE — Telephone Encounter (Signed)
Last OV: 02/21/16 Next OV: none on file.   BMP Latest Ref Rng & Units 02/21/2016 11/27/2015 02/14/2015  Glucose 65 - 99 mg/dL 194(H) 136(H) 204(H)  BUN 8 - 27 mg/dL 21 25 27(H)  Creatinine 0.76 - 1.27 mg/dL 1.29(H) 1.47(H) 1.32(H)  BUN/Creat Ratio 10 - 24 16 17  -  Sodium 134 - 144 mmol/L 137 139 134(L)  Potassium 3.5 - 5.2 mmol/L 4.5 4.5 3.9  Chloride 96 - 106 mmol/L 99 101 99(L)  CO2 18 - 29 mmol/L 18 19 24   Calcium 8.6 - 10.2 mg/dL 9.7 9.4 9.2    Lab Results  Component Value Date   CHOL 128 05/28/2015   HDL 27 (L) 05/28/2015   LDLCALC Comment 05/28/2015   TRIG 403 (H) 05/28/2015   CHOLHDL 4.7 05/28/2015   Lab Results  Component Value Date   CREATININE 1.29 (H) 02/21/2016   BUN 21 02/21/2016   NA 137 02/21/2016   K 4.5 02/21/2016   CL 99 02/21/2016   CO2 18 02/21/2016

## 2016-09-09 DIAGNOSIS — G20 Parkinson's disease: Secondary | ICD-10-CM | POA: Diagnosis not present

## 2016-09-09 DIAGNOSIS — R262 Difficulty in walking, not elsewhere classified: Secondary | ICD-10-CM | POA: Diagnosis not present

## 2016-09-09 DIAGNOSIS — G8929 Other chronic pain: Secondary | ICD-10-CM | POA: Diagnosis not present

## 2016-09-09 DIAGNOSIS — M545 Low back pain: Secondary | ICD-10-CM | POA: Diagnosis not present

## 2016-09-21 ENCOUNTER — Emergency Department
Admission: EM | Admit: 2016-09-21 | Discharge: 2016-09-21 | Disposition: A | Discharge status: Home / Self Care | Payer: Medicare Other | Attending: Emergency Medicine | Admitting: Emergency Medicine

## 2016-09-21 ENCOUNTER — Encounter: Payer: Self-pay | Admitting: Emergency Medicine

## 2016-09-21 ENCOUNTER — Emergency Department: Payer: Medicare Other

## 2016-09-21 DIAGNOSIS — Z7984 Long term (current) use of oral hypoglycemic drugs: Secondary | ICD-10-CM | POA: Insufficient documentation

## 2016-09-21 DIAGNOSIS — Z79899 Other long term (current) drug therapy: Secondary | ICD-10-CM | POA: Insufficient documentation

## 2016-09-21 DIAGNOSIS — E119 Type 2 diabetes mellitus without complications: Secondary | ICD-10-CM | POA: Diagnosis not present

## 2016-09-21 DIAGNOSIS — I509 Heart failure, unspecified: Secondary | ICD-10-CM | POA: Insufficient documentation

## 2016-09-21 DIAGNOSIS — N200 Calculus of kidney: Secondary | ICD-10-CM | POA: Insufficient documentation

## 2016-09-21 DIAGNOSIS — N1330 Unspecified hydronephrosis: Secondary | ICD-10-CM | POA: Diagnosis not present

## 2016-09-21 DIAGNOSIS — R109 Unspecified abdominal pain: Secondary | ICD-10-CM | POA: Diagnosis present

## 2016-09-21 DIAGNOSIS — N281 Cyst of kidney, acquired: Secondary | ICD-10-CM | POA: Diagnosis not present

## 2016-09-21 DIAGNOSIS — I110 Hypertensive heart disease with heart failure: Secondary | ICD-10-CM | POA: Insufficient documentation

## 2016-09-21 DIAGNOSIS — Z85828 Personal history of other malignant neoplasm of skin: Secondary | ICD-10-CM | POA: Insufficient documentation

## 2016-09-21 LAB — CBC
HCT: 33.8 % — ABNORMAL LOW (ref 40.0–52.0)
Hemoglobin: 11.7 g/dL — ABNORMAL LOW (ref 13.0–18.0)
MCH: 31.4 pg (ref 26.0–34.0)
MCHC: 34.5 g/dL (ref 32.0–36.0)
MCV: 91 fL (ref 80.0–100.0)
Platelets: 197 10*3/uL (ref 150–440)
RBC: 3.71 MIL/uL — ABNORMAL LOW (ref 4.40–5.90)
RDW: 14 % (ref 11.5–14.5)
WBC: 8.9 10*3/uL (ref 3.8–10.6)

## 2016-09-21 LAB — TROPONIN I: Troponin I: <0.03 ng/mL (ref <0.03)

## 2016-09-21 LAB — BASIC METABOLIC PANEL
Anion gap: 7 (ref 5–15)
BUN: 19 mg/dL (ref 6–20)
CO2: 23 mmol/L (ref 22–32)
Calcium: 8.4 mg/dL — ABNORMAL LOW (ref 8.9–10.3)
Chloride: 102 mmol/L (ref 101–111)
Creatinine, Ser: 1.7 mg/dL — ABNORMAL HIGH (ref 0.61–1.24)
GFR calc Af Amer: 41 mL/min — ABNORMAL LOW (ref >60)
GFR calc non Af Amer: 35 mL/min — ABNORMAL LOW (ref >60)
Glucose, Bld: 231 mg/dL — ABNORMAL HIGH (ref 65–99)
Potassium: 4.1 mmol/L (ref 3.5–5.1)
Sodium: 132 mmol/L — ABNORMAL LOW (ref 135–145)

## 2016-09-21 LAB — URINALYSIS, COMPLETE (UACMP) WITH MICROSCOPIC
Bacteria, UA: NONE SEEN
Bilirubin Urine: NEGATIVE
Glucose, UA: >=500 mg/dL — AB
Ketones, ur: NEGATIVE mg/dL
Leukocytes, UA: NEGATIVE
Nitrite: NEGATIVE
Protein, ur: NEGATIVE mg/dL
Specific Gravity, Urine: 1.029 (ref 1.005–1.030)
pH: 5 (ref 5.0–8.0)

## 2016-09-21 LAB — DIGOXIN LEVEL: Digoxin Level: 1.4 ng/mL (ref 0.8–2.0)

## 2016-09-21 LAB — CK: Total CK: 77 U/L (ref 49–397)

## 2016-09-21 MED ORDER — TAMSULOSIN HCL 0.4 MG PO CAPS
0.4000 mg | ORAL_CAPSULE | Freq: Once | ORAL | Status: AC
  Administered 2016-09-21: 0.4 mg via ORAL
  Filled 2016-09-21: qty 1

## 2016-09-21 MED ORDER — TAMSULOSIN HCL 0.4 MG PO CAPS: 0.4000 mg | ORAL_CAPSULE | Freq: Every day | ORAL | 0 refills | Status: DC

## 2016-09-21 MED ORDER — SODIUM CHLORIDE 0.9 % IV BOLUS (SEPSIS)
500.0000 mL | Freq: Once | INTRAVENOUS | Status: AC
  Administered 2016-09-21: 500 mL via INTRAVENOUS

## 2016-09-21 NOTE — ED Notes (Signed)
Pt returned from CT via stretcher.

## 2016-09-21 NOTE — ED Notes (Signed)
Pt taken to CT via stretcher.

## 2016-09-21 NOTE — ED Notes (Signed)
Pt states L sided pain that began this morning but is resolved now. States weakness began this morning. Pt ambulating around room upon this RN arrival. Pt states chillls but unknown fever. Denies N&V&D.

## 2016-09-21 NOTE — ED Triage Notes (Signed)
Pt presents to the ER from home accompanied by daughter with complaints of dizziness, weakness, left flank pain denies any urinary symptoms, denies any constipation

## 2016-09-21 NOTE — ED Provider Notes (Signed)
[facility] Emergency Department Provider Note  ____________________________________________   First MD Initiated Contact with Patient 09/21/16 1446     (approximate)  I have reviewed the triage vital signs and the nursing notes.   HISTORY  Chief Complaint Dizziness; Weakness; and Flank Pain   HPI Timothy [name] is a 83 y.o. male with a history of CHF and TIA on eliquis was presenting to the emergency department today with weakness, dizziness/lightheadedness as well as left flank pain earlier this morning. He says that the symptoms lasted several hours this morning and have now completely resolved. He denies any lightheadedness which she said was present while he was lying down. He says that the left-sided flank and abdominal pain was about an 8 out of 10 and constant and is now completely resolved. Denies any chest pain or shortness of breath. Denies any burning with urination. Says that he has a history of kidney stones as well as diverticulitis. However, no reports of blood in his stool or nausea or vomiting. Says was concerned about a TIA. Has been compliant with his eliquis. Denies any focal weakness. Says the last time he was here for similar symptoms he also had left arm numbness. He any numbness or weakness at this time.   Past Medical History:  Diagnosis Date  . Back pain   . CHF (congestive heart failure) ([place])   . Esophageal reflux   . 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 Senile osteoporosis   . Skin cancer   . TIA (transient ischemic attack)     Patient Active Problem List   Diagnosis Date Noted  . Anemia 05/28/2015  . Leg weakness, bilateral 04/12/2015  . Tremor 03/27/2015  . Chronic anxiety 02/20/2015  . SVT (supraventricular tachycardia) ([place]) 02/16/2015  . Paroxysmal atrial fibrillation ([place]) 02/16/2015  . History of back surgery 02/16/2015  . AAA (abdominal aortic aneurysm) without rupture ([place])  10/07/2012  . TIA (transient ischemic attack) 12/29/2011  . HLD (hyperlipidemia) 09/24/2011  . Essential hypertension 09/24/2011  . DM type 2 (diabetes mellitus, type 2) ([place]) 09/24/2011    Past Surgical History:  Procedure Laterality Date  . APPENDECTOMY    . BACK SURGERY    . CATARACT EXTRACTION    . COLONOSCOPY  [id number]  . ESOPHAGEAL DILATION    . GALLBLADDER SURGERY    . HERNIA REPAIR    . RETINAL DETACHMENT SURGERY    . TONSILLECTOMY      Prior to Admission medications   Medication Sig Start Date End Date Taking? Authorizing Provider  acetaminophen (TYLENOL) 500 MG tablet Take 1,000 mg by mouth every 8 (eight) hours as needed.    Yes Historical Provider, MD  apixaban (ELIQUIS) 5 MG TABS tablet Take 1 tablet (5 mg total) by mouth 2 (two) times daily. 02/27/16  Yes Guadalupe Maple, MD  benazepril (LOTENSIN) 5 MG tablet Take 1 tablet (5 mg total) by mouth daily. 11/27/15  Yes Guadalupe Maple, MD  Calcium Citrate-Vitamin D (CALCIUM + D PO) Take 1 tablet by mouth 2 (two) times daily.    Yes Historical Provider, MD  carbidopa-levodopa (SINEMET) 25-100 MG tablet Take 100 mg by mouth 4 (four) times daily.    Yes Historical Provider, MD  cyanocobalamin (,VITAMIN B-12,) 1000 MCG/ML injection INJECT 1ML EVERY MONTH 12/05/15  Yes Guadalupe Maple, MD  dapagliflozin propanediol (FARXIGA) 10 MG TABS tablet Take 10 mg by mouth daily. 08/12/16  Yes Elta Guadeloupe  A Crissman, MD  digoxin (LANOXIN) 0.125 MG tablet Take 1 tablet (125 mcg total) by mouth daily. 03/21/16 03/21/17 Yes [name], MD  diltiazem (CARDIZEM) 30 MG tablet Take 1 tablet (30 mg total) by mouth 3 (three) times daily as needed. 02/16/15  Yes [name], MD  flecainide (TAMBOCOR) 50 MG tablet Take 1 tablet (50 mg total) by mouth 2 (two) times daily. 12/17/15  Yes [name], MD  glipiZIDE-metformin (METAGLIP) 5-500 MG tablet TAKE 1 TABLET BY MOUTH TWICE DAILY 06/10/16  Yes Guadalupe Maple, MD  GLUCOSAMINE-CHONDROITIN-VIT D3 PO  Take by mouth daily.   Yes Historical Provider, MD  guaiFENesin (ROBITUSSIN) 100 MG/5ML SOLN Take 5 mLs by mouth every 4 (four) hours as needed for cough or to loosen phlegm.   Yes Historical Provider, MD  hydrocortisone 2.5 % cream Apply 1 application topically as needed.  08/13/15  Yes Historical Provider, MD  metoprolol succinate (TOPROL-XL) 50 MG 24 hr tablet TAKE 1 TABLET BY MOUTH DAILY 09/01/16  Yes Guadalupe Maple, MD  Multiple Vitamins-Minerals (CEROVITE SENIOR PO) Take 1 tablet by mouth daily.   Yes Historical Provider, MD  pantoprazole (PROTONIX) 40 MG tablet TAKE ONE TABLET TWICE DAILY 06/23/16  Yes Guadalupe Maple, MD  Probiotic Product ([place]) Take by mouth.   Yes Historical Provider, MD  rosuvastatin (CRESTOR) 10 MG tablet TAKE 1 TABLET BY MOUTH DAILY 09/01/16  Yes Guadalupe Maple, MD  sitaGLIPtin (JANUVIA) 100 MG tablet Take 1 tablet (100 mg total) by mouth daily. 11/27/15  Yes Guadalupe Maple, MD  torsemide (DEMADEX) 20 MG tablet Take 20 mg by mouth daily.   Yes Historical Provider, MD    Allergies Patient has no known allergies.  Family History  Problem Relation Age of Onset  . Heart disease Mother   . Diabetes      grandparents  . Heart disease Brother   . Stroke Brother     Social History Social History  Substance Use Topics  . Smoking status: Never Smoker  . Smokeless tobacco: Never Used  . Alcohol use No    Review of Systems Constitutional: No fever/chills Eyes: No visual changes. ENT: No sore throat. Cardiovascular: Denies chest pain. Respiratory: Denies shortness of breath. Gastrointestinal:  No nausea, no vomiting.  No diarrhea.  No constipation. Genitourinary: Negative for dysuria. Musculoskeletal: Negative for back pain. Skin: Negative for rash. Neurological: Negative for headaches, focal weakness or numbness.  10-point ROS otherwise negative.  ____________________________________________   PHYSICAL EXAM:  VITAL SIGNS: ED  Triage Vitals [09/21/16 1323]  Enc Vitals Group     BP (!) 102/53     Pulse Rate (!) 58     Resp 20     Temp 98.3 F (36.8 C)     Temp Source Oral     SpO2 95 %     Weight 175 lb (79.4 kg)     Height 5\' 8"  (1.727 m)     Head Circumference      Peak Flow      Pain Score 2     Pain Loc      Pain Edu?      Excl. in [place]?     Constitutional: Alert and oriented. Well appearing and in no acute distress. Eyes: Conjunctivae are normal. PERRL. EOMI. Head: Atraumatic.Normal TMs bilaterally. Nose: No congestion/rhinnorhea. Mouth/Throat: Mucous membranes are moist.  Oropharynx non-erythematous. Neck: No stridor.   Cardiovascular: Normal rate, regular rhythm. Grossly normal heart  sounds.  Respiratory: Normal respiratory effort.  No retractions. Lungs CTAB. Gastrointestinal: Soft and nontender. No distention. Musculoskeletal: No lower extremity tenderness nor edema.  No joint effusions. Neurologic:  Normal speech and language. No gross focal neurologic deficits are appreciated. Skin:  Skin is warm, dry and intact. No rash noted. Psychiatric: Mood and affect are normal. Speech and behavior are normal.  ____________________________________________   LABS (all labs ordered are listed, but only abnormal results are displayed)  Labs Reviewed  BASIC METABOLIC PANEL - Abnormal; Notable for the following:       Result Value   Sodium 132 (*)    Glucose, Bld 231 (*)    Creatinine, Ser 1.70 (*)    Calcium 8.4 (*)    GFR calc non Af Amer 35 (*)    GFR calc Af Amer 41 (*)    All other components within normal limits  CBC - Abnormal; Notable for the following:    RBC 3.71 (*)    Hemoglobin 11.7 (*)    HCT 33.8 (*)    All other components within normal limits  URINALYSIS, COMPLETE (UACMP) WITH MICROSCOPIC - Abnormal; Notable for the following:    Color, Urine YELLOW (*)    APPearance CLEAR (*)    Glucose, UA >=500 (*)    Hgb urine dipstick LARGE (*)    Squamous Epithelial / LPF 0-5 (*)     All other components within normal limits  TROPONIN I  CK  DIGOXIN LEVEL  CBG MONITORING, ED   ____________________________________________  EKG  ED ECG REPORT I, Doran Stabler, the attending physician, personally viewed and interpreted this ECG.   Date: 09/21/2016  EKG Time: 1332  Rate: 59  Rhythm: normal sinus rhythm  Axis: Left axis deviation  Intervals:nonspecific intraventricular conduction delay  ST&T Change: No ST segment elevation or depression. No abnormal T-wave inversion.  ____________________________________________  RADIOLOGY  CT RENAL STONE STUDY (Final result)  Result time 09/21/16 16:18:48  Final result by York Ram, MD (09/21/16 16:18:48)           Narrative:   CLINICAL DATA: Left-sided pain beginning this morning.  EXAM: CT ABDOMEN AND PELVIS WITHOUT CONTRAST  TECHNIQUE: Multidetector CT imaging of the abdomen and pelvis was performed following the standard protocol without IV contrast.  COMPARISON: None.  FINDINGS: Lower chest: No suspicious nodules, masses, or focal infiltrates. Mild reticular changes peripherally suggests mild fibrotic change. No other significant abnormalities within the lung bases.  Hepatobiliary: No focal liver abnormality is seen. Status post cholecystectomy. No biliary dilatation.  Pancreas: Unremarkable. No pancreatic ductal dilatation or surrounding inflammatory changes.  Spleen: Normal in size without focal abnormality.  Adrenals/Urinary Tract: The adrenal glands are normal. No stones, masses, or hydronephrosis on the right. There is a 5 mm stone in the lower pole left kidney. No other left-sided stones. Mild hydronephrosis on the left. There is increased left perinephric stranding compared to the right. An exophytic mass off of the left kidney demonstrates an attenuation of 11 Hounsfield units, consistent with a cyst. The left ureter is more prominent than the right and there is a 3.2 mm  stone just above the left UVJ in the distal ureter. The right ureter demonstrates no stones or dilatation.  Stomach/Bowel: The stomach and small bowel are within normal limits. Colonic diverticulosis is seen without diverticulitis. The appendix is not seen but there is no secondary evidence of appendicitis.  Vascular/Lymphatic: Atherosclerotic changes are seen in the non aneurysmal abdominal aorta.  Atherosclerotic changes extend into the iliac and femoral vessels. No adenopathy identified.  Reproductive: Prostate is unremarkable.  Other: There is a fat containing left inguinal hernia. There is a fat containing umbilical hernia. No free air or free fluid.  Musculoskeletal: Pedicle rods and screws are associated with the lower lumbar spine. Hardware is in good position. Disc spacer devices are seen at the same levels, L3-4 and L4-5.  IMPRESSION: 1. 3.2 mm stone in the distal left ureter near the UVJ resulting in mild left hydronephrosis and perinephric stranding. There is a 5 mm stone in the lower pole of the left kidney. 2. Atherosclerosis in the abdominal aorta.   Electronically Signed By: Dorise Bullion III M.D On: 09/21/2016 16:18            ____________________________________________   PROCEDURES  Procedure(s) performed:   Procedures  Critical Care performed:   ____________________________________________   INITIAL IMPRESSION / ASSESSMENT AND PLAN / ED COURSE  Pertinent labs & imaging results that were available during my care of the patient were reviewed by me and considered in my medical decision making (see chart for details).  ----------------------------------------- 5:40 PM on 09/21/2016 -----------------------------------------  Patient continues to be pain-free. Found to have a 3 mm distal ureteral stone. Will be discharged with Flomax. Feel that the kidney stone explains the pain that was associated with the lightheadedness without any  focal findings of weakness or numbness. We discussed pain control the patient would like to uses at home Tylenol. We discussed return precautions as well as need for follow-up with urology. His most recent pressure was in the 893 systolic and a believe that he'll be able to tolerate the Flomax. He is understanding of the diagnosis and the plan and is willing to comply.      ____________________________________________   FINAL CLINICAL IMPRESSION(S) / ED DIAGNOSES  Final diagnoses:  Left flank pain   Kidney stone   NEW MEDICATIONS STARTED DURING THIS VISIT:  New Prescriptions   No medications on file     Note:  This document was prepared using Dragon voice recognition software and may include unintentional dictation errors.    Orbie Pyo, MD 09/21/16 [phone]

## 2016-09-30 ENCOUNTER — Ambulatory Visit (INDEPENDENT_AMBULATORY_CARE_PROVIDER_SITE_OTHER): Payer: Medicare Other | Admitting: Urology

## 2016-09-30 ENCOUNTER — Encounter: Payer: Self-pay | Admitting: Urology

## 2016-09-30 VITALS — BP 106/65 | HR 73 | Ht 69.0 in | Wt 185.8 lb

## 2016-09-30 DIAGNOSIS — N200 Calculus of kidney: Secondary | ICD-10-CM

## 2016-09-30 LAB — MICROSCOPIC EXAMINATION
Bacteria, UA: NONE SEEN
Epithelial Cells (non renal): NONE SEEN /hpf (ref 0–10)

## 2016-09-30 LAB — URINALYSIS, COMPLETE
Bilirubin, UA: NEGATIVE
Ketones, UA: NEGATIVE
Leukocytes, UA: NEGATIVE
Nitrite, UA: NEGATIVE
Protein, UA: NEGATIVE
RBC, UA: NEGATIVE
Specific Gravity, UA: <1.005 — ABNORMAL LOW (ref 1.005–1.030)
Urobilinogen, Ur: 0.2 mg/dL (ref 0.2–1.0)
pH, UA: 5 (ref 5.0–7.5)

## 2016-09-30 NOTE — Progress Notes (Signed)
09/30/2016 1:48 PM   Timothy [name] 06-18-1929 [id number]  Referring provider: Guadalupe Maple, MD [address] [place], [place] [address]  Chief Complaint  Patient presents with  . New Patient (Initial Visit)    nephrolithiasis    HPI: 83 year old male who presents today for further evaluation and management of a left distal ureteral stone.  He presented to the emergency department  on 09/21/16 with acute onset left lower abdominal pain with associated urinary urgency and frequency. The patient was found to have a very small stone at the left UVJ with associated mild hydronephrosis. In the ER, the patient was noted to have a urine analysis that demonstrated red blood cells, but no evidence of infection. A CT scan showed the stone and no additional findings. In the ER, the patient was given IV pain medication and his symptoms resolved. He was started on tamsulosin 0.4 milligrams daily.  Since the patient was discharged from the emergency department he has not had any additional symptoms. He did notice that he had a weird burning sensation 1 time and he feels as if this was the stone passing. He did not capture.  The patient denies any fevers or chills. He denies any worsening of progressive voiding symptoms. He is no longer having any flank pain. He denies any gross hematuria.   PMH: Past Medical History:  Diagnosis Date  . Back pain   . CHF (congestive heart failure) ([place])   . Esophageal reflux   . 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 Senile osteoporosis   . Skin cancer   . TIA (transient ischemic attack)     Surgical History: Past Surgical History:  Procedure Laterality Date  . APPENDECTOMY    . BACK SURGERY    . CATARACT EXTRACTION    . COLONOSCOPY  [id number]  . ESOPHAGEAL DILATION    . GALLBLADDER SURGERY    . HERNIA REPAIR    . RETINAL DETACHMENT SURGERY    . TONSILLECTOMY      Home Medications:  Allergies as of 09/30/2016     No Known Allergies     Medication List       Accurate as of 09/30/16  1:48 PM. Always use your most recent med list.          acetaminophen 500 MG tablet Commonly known as:  TYLENOL Take 1,000 mg by mouth every 8 (eight) hours as needed.   apixaban 5 MG Tabs tablet Commonly known as:  ELIQUIS Take 1 tablet (5 mg total) by mouth 2 (two) times daily.   benazepril 5 MG tablet Commonly known as:  LOTENSIN Take 1 tablet (5 mg total) by mouth daily.   CALCIUM + D PO Take 1 tablet by mouth 2 (two) times daily.   CEROVITE SENIOR PO Take 1 tablet by mouth daily.   cyanocobalamin 1000 MCG/ML injection Commonly known as:  (VITAMIN B-12) INJECT 1ML EVERY MONTH   dapagliflozin propanediol 10 MG Tabs tablet Commonly known as:  FARXIGA Take 10 mg by mouth daily.   digoxin 0.125 MG tablet Commonly known as:  LANOXIN Take 1 tablet (125 mcg total) by mouth daily.   diltiazem 30 MG tablet Commonly known as:  CARDIZEM Take 1 tablet (30 mg total) by mouth 3 (three) times daily as needed.   flecainide 50 MG tablet Commonly known as:  TAMBOCOR Take 1 tablet (50 mg total) by mouth 2 (two) times daily.   glipiZIDE-metformin 5-500  MG tablet Commonly known as:  METAGLIP TAKE 1 TABLET BY MOUTH TWICE DAILY   GLUCOSAMINE-CHONDROITIN-VIT D3 PO Take by mouth daily.   guaiFENesin 100 MG/5ML Soln Commonly known as:  ROBITUSSIN Take 5 mLs by mouth every 4 (four) hours as needed for cough or to loosen phlegm.   hydrocortisone 2.5 % cream Apply 1 application topically as needed.   metoprolol succinate 50 MG 24 hr tablet Commonly known as:  TOPROL-XL TAKE 1 TABLET BY MOUTH DAILY   pantoprazole 40 MG tablet Commonly known as:  PROTONIX TAKE ONE TABLET TWICE DAILY   [place] COLON HEALTH PO Take by mouth.   rosuvastatin 10 MG tablet Commonly known as:  CRESTOR TAKE 1 TABLET BY MOUTH DAILY   SINEMET 25-100 MG tablet Generic drug:  carbidopa-levodopa Take 100 mg by mouth 4  (four) times daily.   sitaGLIPtin 100 MG tablet Commonly known as:  JANUVIA Take 1 tablet (100 mg total) by mouth daily.   tamsulosin 0.4 MG Caps capsule Commonly known as:  FLOMAX Take 1 capsule (0.4 mg total) by mouth daily.   torsemide 20 MG tablet Commonly known as:  DEMADEX Take 20 mg by mouth daily.       Allergies: No Known Allergies  Family History: Family History  Problem Relation Age of Onset  . Heart disease Mother   . Diabetes      grandparents  . Heart disease Brother   . Stroke Brother     Social History:  reports that he has never smoked. He has never used smokeless tobacco. He reports that he does not drink alcohol or use drugs.  ROS: UROLOGY Frequent Urination?: No Hard to postpone urination?: No Burning/pain with urination?: No Get up at night to urinate?: Yes Leakage of urine?: No Urine stream starts and stops?: No Trouble starting stream?: No Do you have to strain to urinate?: No Blood in urine?: No Urinary tract infection?: No Sexually transmitted disease?: No Injury to kidneys or bladder?: No Painful intercourse?: No Weak stream?: No Erection problems?: No Penile pain?: No  Gastrointestinal Nausea?: No Vomiting?: No Indigestion/heartburn?: No Diarrhea?: No Constipation?: No  Constitutional Fever: No Night sweats?: No Weight loss?: No Fatigue?: No  Skin Skin rash/lesions?: No Itching?: No  Eyes Blurred vision?: No Double vision?: No  Ears/Nose/Throat Sore throat?: No Sinus problems?: No  Hematologic/Lymphatic Swollen glands?: No Easy bruising?: No  Cardiovascular Leg swelling?: No Chest pain?: No  Respiratory Cough?: No Shortness of breath?: No  Endocrine Excessive thirst?: No  Musculoskeletal Back pain?: No Joint pain?: No  Neurological Headaches?: No Dizziness?: No  Psychologic Depression?: No Anxiety?: No  Physical Exam: BP 106/65   Pulse 73   Ht 5\' 9"  (1.753 m)   Wt 84.3 kg (185 lb 12.8  oz)   BMI 27.44 kg/m   Constitutional:  Alert and oriented, No acute distress. HEENT: [place] AT, moist mucus membranes.  Trachea midline, no masses. Cardiovascular: No clubbing, cyanosis, or edema. Respiratory: Normal respiratory effort, no increased work of breathing. GI: Abdomen is soft, nontender, nondistended, no abdominal masses GU: No CVA tenderness.  Skin: No rashes, bruises or suspicious lesions. Lymph: No cervical or inguinal adenopathy. Neurologic: Grossly intact, no focal deficits, moving all 4 extremities. Psychiatric: Normal mood and affect.  Laboratory Data: Lab Results  Component Value Date   WBC 8.9 09/21/2016   HGB 11.7 (L) 09/21/2016   HCT 33.8 (L) 09/21/2016   MCV 91.0 09/21/2016   PLT 197 09/21/2016    Lab Results  Component Value Date   CREATININE 1.70 (H) 09/21/2016    No results found for: PSA  No results found for: TESTOSTERONE  Lab Results  Component Value Date   HGBA1C 7.6 02/21/2016    Urinalysis    Component Value Date/Time   COLORURINE YELLOW (A) 09/21/2016 1329   APPEARANCEUR CLEAR (A) 09/21/2016 1329   LABSPEC 1.029 09/21/2016 1329   PHURINE 5.0 09/21/2016 1329   GLUCOSEU >=500 (A) 09/21/2016 1329   HGBUR LARGE (A) 09/21/2016 1329   BILIRUBINUR NEGATIVE 09/21/2016 1329   KETONESUR NEGATIVE 09/21/2016 1329   PROTEINUR NEGATIVE 09/21/2016 1329   NITRITE NEGATIVE 09/21/2016 1329   LEUKOCYTESUR NEGATIVE 09/21/2016 1329    Pertinent Imaging: I have independently reviewed the patient's CT scan which demonstrates a small non-obstructing stone in the left mid pole as well as a left UVJ stone that is very small.  I don't appreciate any significant stones in the patient's right kidney.  Assessment & Plan:  83 year old male with a very small UVJ stone that likely passed. He is asymptomatic, his urinalysis today demonstrates no ongoing microscopic hematuria. He does have a nonobstructing stone in the left kidney, this is very small. I reassured  the patient and we collectively opted to treat him conservatively. We'll plan a follow-up if the patient become symptomatic, otherwise he will follow-up on an as-needed basis.  1. Nephrolithiasis  - Urinalysis, Complete   Return if symptoms worsen or fail to improve.  Ardis Hughs, [facility] [address] [place], [place] [address] [phone]

## 2016-10-15 ENCOUNTER — Other Ambulatory Visit: Payer: Self-pay | Admitting: Family Medicine

## 2016-10-18 ENCOUNTER — Other Ambulatory Visit: Payer: Self-pay | Admitting: Family Medicine

## 2016-10-18 DIAGNOSIS — I10 Essential (primary) hypertension: Secondary | ICD-10-CM

## 2016-10-20 NOTE — Telephone Encounter (Signed)
Your patient 

## 2016-11-02 DIAGNOSIS — N23 Unspecified renal colic: Secondary | ICD-10-CM | POA: Diagnosis not present

## 2016-11-02 DIAGNOSIS — R531 Weakness: Secondary | ICD-10-CM | POA: Diagnosis not present

## 2016-11-02 DIAGNOSIS — N400 Enlarged prostate without lower urinary tract symptoms: Secondary | ICD-10-CM | POA: Diagnosis not present

## 2016-11-02 DIAGNOSIS — R404 Transient alteration of awareness: Secondary | ICD-10-CM | POA: Diagnosis not present

## 2016-11-02 DIAGNOSIS — I959 Hypotension, unspecified: Secondary | ICD-10-CM | POA: Diagnosis not present

## 2016-11-04 ENCOUNTER — Other Ambulatory Visit: Payer: Self-pay | Admitting: Family Medicine

## 2016-11-04 NOTE — Telephone Encounter (Signed)
Last OV: 02/21/16 Next OV: 11/27/16  BMP Latest Ref Rng & Units 09/21/2016 02/21/2016 11/27/2015  Glucose 65 - 99 mg/dL 231(H) 194(H) 136(H)  BUN 6 - 20 mg/dL 19 21 25   Creatinine 0.61 - 1.24 mg/dL 1.70(H) 1.29(H) 1.47(H)  BUN/Creat Ratio 10 - 24 - 16 17  Sodium 135 - 145 mmol/L 132(L) 137 139  Potassium 3.5 - 5.1 mmol/L 4.1 4.5 4.5  Chloride 101 - 111 mmol/L 102 99 101  CO2 22 - 32 mmol/L 23 18 19   Calcium 8.9 - 10.3 mg/dL 8.4(L) 9.7 9.4    Lab Results  Component Value Date   CHOL 128 05/28/2015   HDL 27 (L) 05/28/2015   LDLCALC Comment 05/28/2015   TRIG 403 (H) 05/28/2015   CHOLHDL 4.7 05/28/2015   Lab Results  Component Value Date   CREATININE 1.70 (H) 09/21/2016   BUN 19 09/21/2016   NA 132 (L) 09/21/2016   K 4.1 09/21/2016   CL 102 09/21/2016   CO2 23 09/21/2016

## 2016-11-12 ENCOUNTER — Other Ambulatory Visit: Payer: Self-pay | Admitting: Family Medicine

## 2016-11-20 ENCOUNTER — Other Ambulatory Visit: Payer: Self-pay | Admitting: Family Medicine

## 2016-11-20 NOTE — Telephone Encounter (Signed)
Last OV: 02/21/16 Next OV: 11/26/16   Lab Results  Component Value Date   HGBA1C 7.6 02/21/2016

## 2016-11-27 ENCOUNTER — Encounter: Payer: Self-pay | Admitting: Family Medicine

## 2016-11-27 ENCOUNTER — Ambulatory Visit (INDEPENDENT_AMBULATORY_CARE_PROVIDER_SITE_OTHER): Payer: Medicare Other | Admitting: Family Medicine

## 2016-11-27 VITALS — BP 135/72 | HR 61 | Ht 68.0 in | Wt 184.2 lb

## 2016-11-27 DIAGNOSIS — I714 Abdominal aortic aneurysm, without rupture, unspecified: Secondary | ICD-10-CM

## 2016-11-27 DIAGNOSIS — D510 Vitamin B12 deficiency anemia due to intrinsic factor deficiency: Secondary | ICD-10-CM | POA: Diagnosis not present

## 2016-11-27 DIAGNOSIS — I480 Paroxysmal atrial fibrillation: Secondary | ICD-10-CM

## 2016-11-27 DIAGNOSIS — Z1329 Encounter for screening for other suspected endocrine disorder: Secondary | ICD-10-CM

## 2016-11-27 DIAGNOSIS — I10 Essential (primary) hypertension: Secondary | ICD-10-CM

## 2016-11-27 DIAGNOSIS — E785 Hyperlipidemia, unspecified: Secondary | ICD-10-CM | POA: Diagnosis not present

## 2016-11-27 DIAGNOSIS — Z125 Encounter for screening for malignant neoplasm of prostate: Secondary | ICD-10-CM | POA: Diagnosis not present

## 2016-11-27 DIAGNOSIS — Z7189 Other specified counseling: Secondary | ICD-10-CM | POA: Diagnosis not present

## 2016-11-27 DIAGNOSIS — E1159 Type 2 diabetes mellitus with other circulatory complications: Secondary | ICD-10-CM

## 2016-11-27 DIAGNOSIS — Z0000 Encounter for general adult medical examination without abnormal findings: Secondary | ICD-10-CM

## 2016-11-27 LAB — URINALYSIS, ROUTINE W REFLEX MICROSCOPIC
Bilirubin, UA: NEGATIVE
Ketones, UA: NEGATIVE
Leukocytes, UA: NEGATIVE
Nitrite, UA: NEGATIVE
Protein, UA: NEGATIVE
RBC, UA: NEGATIVE
Specific Gravity, UA: 1.015 (ref 1.005–1.030)
Urobilinogen, Ur: 0.2 mg/dL (ref 0.2–1.0)
pH, UA: 5 (ref 5.0–7.5)

## 2016-11-27 LAB — MICROSCOPIC EXAMINATION
Bacteria, UA: NONE SEEN
RBC, UA: NONE SEEN /hpf (ref 0–?)
WBC, UA: NONE SEEN /hpf (ref 0–?)

## 2016-11-27 LAB — BAYER DCA HB A1C WAIVED: HB A1C (BAYER DCA - WAIVED): 7.8 % — ABNORMAL HIGH (ref <7.0)

## 2016-11-27 MED ORDER — BENAZEPRIL HCL 5 MG PO TABS: 5.0000 mg | ORAL_TABLET | Freq: Every day | ORAL | 6 refills | Status: DC

## 2016-11-27 MED ORDER — DAPAGLIFLOZIN PROPANEDIOL 10 MG PO TABS: 10.0000 mg | ORAL_TABLET | Freq: Every day | ORAL | 4 refills | Status: DC

## 2016-11-27 MED ORDER — GLIPIZIDE-METFORMIN HCL 5-500 MG PO TABS: 1.0000 | ORAL_TABLET | Freq: Two times a day (BID) | ORAL | 1 refills | Status: DC

## 2016-11-27 MED ORDER — ROSUVASTATIN CALCIUM 10 MG PO TABS: 10.0000 mg | ORAL_TABLET | Freq: Every day | ORAL | 6 refills | Status: DC

## 2016-11-27 MED ORDER — SITAGLIPTIN PHOSPHATE 100 MG PO TABS: 100.0000 mg | ORAL_TABLET | Freq: Every day | ORAL | 1 refills | Status: DC

## 2016-11-27 MED ORDER — APIXABAN 5 MG PO TABS: 5.0000 mg | ORAL_TABLET | Freq: Two times a day (BID) | ORAL | 6 refills | Status: DC

## 2016-11-27 MED ORDER — CYANOCOBALAMIN 1000 MCG/ML IJ SOLN: 1000.0000 ug | INTRAMUSCULAR | 12 refills | Status: DC

## 2016-11-27 MED ORDER — METOPROLOL SUCCINATE ER 50 MG PO TB24: 50.0000 mg | ORAL_TABLET | Freq: Every day | ORAL | 6 refills | Status: DC

## 2016-11-27 NOTE — Assessment & Plan Note (Signed)
The current medical regimen is effective;  continue present plan and medications.  

## 2016-11-27 NOTE — Progress Notes (Signed)
BP 135/72   Pulse 61   Ht '5\' 8"'  (1.727 m)   Wt 184 lb 3.2 oz (83.6 kg)   SpO2 99%   BMI 28.01 kg/m    Subjective:    Patient ID: Timothy [name], male    DOB: 10-13-1928, 83 y.o.   MRN: [id number]  HPI: Timothy [name] is a 83 y.o. male  Annual exam AWV metrics met Patient accompanied by his daughter who assists with history. Patient all in all stable taking medications as listed in med sheet no bleeding bruising disorders. Blood pressure doing well. Diabetes stable with no issues noted low blood sugar spells. Wife continues to do poorly with hospice care. Relevant past medical, surgical, family and social history reviewed and updated as indicated. Interim medical history since our last visit reviewed. Allergies and medications reviewed and updated.  Review of Systems  Constitutional: Negative.   HENT: Negative.   Eyes: Negative.   Respiratory: Negative.   Cardiovascular: Negative.   Gastrointestinal: Negative.   Endocrine: Negative.   Genitourinary: Negative.   Musculoskeletal: Negative.   Skin: Negative.   Allergic/Immunologic: Negative.   Neurological: Negative.   Hematological: Negative.   Psychiatric/Behavioral: Negative.     Per HPI unless specifically indicated above     Objective:    BP 135/72   Pulse 61   Ht '5\' 8"'  (1.727 m)   Wt 184 lb 3.2 oz (83.6 kg)   SpO2 99%   BMI 28.01 kg/m   Wt Readings from Last 3 Encounters:  11/27/16 184 lb 3.2 oz (83.6 kg)  09/30/16 185 lb 12.8 oz (84.3 kg)  09/21/16 175 lb (79.4 kg)    Physical Exam  Constitutional: He is oriented to person, place, and time. He appears well-developed and well-nourished.  HENT:  Head: Normocephalic.  Right Ear: External ear normal.  Left Ear: External ear normal.  Nose: Nose normal.  Eyes: Conjunctivae and EOM are normal. Pupils are equal, round, and reactive to light.  Neck: Normal range of motion. Neck supple. No thyromegaly present.  Cardiovascular: Normal rate, regular rhythm,  normal heart sounds and intact distal pulses.   Pulmonary/Chest: Effort normal and breath sounds normal.  Abdominal: Soft. Bowel sounds are normal. There is no splenomegaly or hepatomegaly.  Genitourinary:  Genitourinary Comments: Not done  Musculoskeletal: Normal range of motion.  Lymphadenopathy:    He has no cervical adenopathy.  Neurological: He is alert and oriented to person, place, and time. He has normal reflexes.  Skin: Skin is warm and dry.  Psychiatric: He has a normal mood and affect. His behavior is normal. Judgment and thought content normal.    Results for orders placed or performed in visit on 09/30/16  Microscopic Examination  Result Value Ref Range   WBC, UA 0-5 0 - 5 /hpf   RBC, UA 0-2 0 - 2 /hpf   Epithelial Cells (non renal) None seen 0 - 10 /hpf   Mucus, UA Present (A) Not Estab.   Bacteria, UA None seen None seen/Few  Urinalysis, Complete  Result Value Ref Range   Specific Gravity, UA <1.005 (L) 1.005 - 1.030   pH, UA 5.0 5.0 - 7.5   Color, UA Yellow Yellow   Appearance Ur Clear Clear   Leukocytes, UA Negative Negative   Protein, UA Negative Negative/Trace   Glucose, UA 3+ (A) Negative   Ketones, UA Negative Negative   RBC, UA Negative Negative   Bilirubin, UA Negative Negative   Urobilinogen, Ur 0.2 0.2 -  1.0 mg/dL   Nitrite, UA Negative Negative   Microscopic Examination See below:       Assessment & Plan:   Problem List Items Addressed This Visit      Cardiovascular and Mediastinum   Essential hypertension    The current medical regimen is effective;  continue present plan and medications.       Relevant Medications   benazepril (LOTENSIN) 5 MG tablet   apixaban (ELIQUIS) 5 MG TABS tablet   metoprolol succinate (TOPROL-XL) 50 MG 24 hr tablet   rosuvastatin (CRESTOR) 10 MG tablet   Other Relevant Orders   CBC with Differential/Platelet   Comprehensive metabolic panel   Urinalysis, Routine w reflex microscopic   Bayer DCA Hb A1c Waived     AAA (abdominal aortic aneurysm) without rupture (HCC)    The current medical regimen is effective;  continue present plan and medications.       Relevant Medications   benazepril (LOTENSIN) 5 MG tablet   apixaban (ELIQUIS) 5 MG TABS tablet   metoprolol succinate (TOPROL-XL) 50 MG 24 hr tablet   rosuvastatin (CRESTOR) 10 MG tablet   Paroxysmal atrial fibrillation (HCC)    The current medical regimen is effective;  continue present plan and medications.       Relevant Medications   benazepril (LOTENSIN) 5 MG tablet   apixaban (ELIQUIS) 5 MG TABS tablet   metoprolol succinate (TOPROL-XL) 50 MG 24 hr tablet   rosuvastatin (CRESTOR) 10 MG tablet     Endocrine   DM type 2 (diabetes mellitus, type 2) ([place])    The current medical regimen is effective;  continue present plan and medications.       Relevant Medications   benazepril (LOTENSIN) 5 MG tablet   dapagliflozin propanediol (FARXIGA) 10 MG TABS tablet   glipiZIDE-metformin (METAGLIP) 5-500 MG tablet   sitaGLIPtin (JANUVIA) 100 MG tablet   rosuvastatin (CRESTOR) 10 MG tablet   Other Relevant Orders   CBC with Differential/Platelet   Comprehensive metabolic panel   Urinalysis, Routine w reflex microscopic   Bayer DCA Hb A1c Waived     Other   HLD (hyperlipidemia)   Relevant Medications   benazepril (LOTENSIN) 5 MG tablet   apixaban (ELIQUIS) 5 MG TABS tablet   metoprolol succinate (TOPROL-XL) 50 MG 24 hr tablet   rosuvastatin (CRESTOR) 10 MG tablet   Other Relevant Orders   CBC with Differential/Platelet   Comprehensive metabolic panel   Lipid panel   Urinalysis, Routine w reflex microscopic   Bayer DCA Hb A1c Waived   Pernicious anemia    The current medical regimen is effective;  continue present plan and medications.       Relevant Medications   cyanocobalamin (,VITAMIN B-12,) 1000 MCG/ML injection   Advanced care planning/counseling discussion    A voluntary discussion about advance care planning  including the explanation and discussion of advance directives was extensively discussed  with the patient.  Explanation about the health care proxy and Living will was reviewed and packet with forms with explanation of how to fill them out was given.  Patient has completed all these features.        Other Visit Diagnoses    Annual physical exam    -  Primary   Relevant Orders   CBC with Differential/Platelet   Comprehensive metabolic panel   Lipid panel   TSH   Urinalysis, Routine w reflex microscopic   Prostate cancer screening       Thyroid disorder screen  Relevant Orders   TSH       Follow up plan: Return in about 3 months (around 02/27/2017) for Hemoglobin A1c.

## 2016-11-27 NOTE — Assessment & Plan Note (Signed)
A voluntary discussion about advance care planning including the explanation and discussion of advance directives was extensively discussed  with the patient.  Explanation about the health care proxy and Living will was reviewed and packet with forms with explanation of how to fill them out was given.  Patient has completed all these features.

## 2016-11-28 LAB — COMPREHENSIVE METABOLIC PANEL
ALT: 11 IU/L (ref 0–44)
AST: 22 IU/L (ref 0–40)
Albumin/Globulin Ratio: 1.7 (ref 1.2–2.2)
Albumin: 4.5 g/dL (ref 3.5–4.7)
Alkaline Phosphatase: 45 IU/L (ref 39–117)
BUN/Creatinine Ratio: 15 (ref 10–24)
BUN: 21 mg/dL (ref 8–27)
Bilirubin Total: 0.5 mg/dL (ref 0.0–1.2)
CO2: 21 mmol/L (ref 18–29)
Calcium: 9.2 mg/dL (ref 8.6–10.2)
Chloride: 100 mmol/L (ref 96–106)
Creatinine, Ser: 1.4 mg/dL — ABNORMAL HIGH (ref 0.76–1.27)
GFR calc Af Amer: 53 mL/min/{1.73_m2} — ABNORMAL LOW (ref >59)
GFR calc non Af Amer: 45 mL/min/{1.73_m2} — ABNORMAL LOW (ref >59)
Globulin, Total: 2.7 g/dL (ref 1.5–4.5)
Glucose: 144 mg/dL — ABNORMAL HIGH (ref 65–99)
Potassium: 4.4 mmol/L (ref 3.5–5.2)
Sodium: 135 mmol/L (ref 134–144)
Total Protein: 7.2 g/dL (ref 6.0–8.5)

## 2016-11-28 LAB — LIPID PANEL
Chol/HDL Ratio: 4 ratio (ref 0.0–5.0)
Cholesterol, Total: 120 mg/dL (ref 100–199)
HDL: 30 mg/dL — ABNORMAL LOW (ref >39)
LDL Calculated: 11 mg/dL (ref 0–99)
Triglycerides: 395 mg/dL — ABNORMAL HIGH (ref 0–149)
VLDL Cholesterol Cal: 79 mg/dL — ABNORMAL HIGH (ref 5–40)

## 2016-11-28 LAB — CBC WITH DIFFERENTIAL/PLATELET
Basophils Absolute: 0 10*3/uL (ref 0.0–0.2)
Basos: 1 %
EOS (ABSOLUTE): 0.4 10*3/uL (ref 0.0–0.4)
Eos: 5 %
Hematocrit: 36.5 % — ABNORMAL LOW (ref 37.5–51.0)
Hemoglobin: 11.9 g/dL — ABNORMAL LOW (ref 13.0–17.7)
Immature Grans (Abs): 0.1 10*3/uL (ref 0.0–0.1)
Immature Granulocytes: 1 %
Lymphocytes Absolute: 2.2 10*3/uL (ref 0.7–3.1)
Lymphs: 27 %
MCH: 30.1 pg (ref 26.6–33.0)
MCHC: 32.6 g/dL (ref 31.5–35.7)
MCV: 92 fL (ref 79–97)
Monocytes Absolute: 0.8 10*3/uL (ref 0.1–0.9)
Monocytes: 10 %
Neutrophils Absolute: 4.6 10*3/uL (ref 1.4–7.0)
Neutrophils: 56 %
Platelets: 209 10*3/uL (ref 150–379)
RBC: 3.95 x10E6/uL — ABNORMAL LOW (ref 4.14–5.80)
RDW: 14.3 % (ref 12.3–15.4)
WBC: 8 10*3/uL (ref 3.4–10.8)

## 2016-11-28 LAB — TSH: TSH: 2.32 u[IU]/mL (ref 0.450–4.500)

## 2016-12-02 ENCOUNTER — Encounter: Payer: Self-pay | Admitting: Family Medicine

## 2016-12-07 IMAGING — MR MRI HEAD WITHOUT CONTRAST
10 series · 48 of 48 positions shown · non-contrast
Comparison: Head CT 09/08/2014 and MRI 12/25/2011

CLINICAL DATA: Episode of blurry vision and generalized weakness
yesterday.

EXAM:
MRI HEAD WITHOUT CONTRAST
TECHNIQUE: Multiplanar, multiecho pulse sequences of the brain and surrounding
structures were obtained without intravenous contrast.

[Series 2: T1 · sagittal · 5.0mm · 0.45mm/px · 4 of 27 slices shown]
[im 1/27]
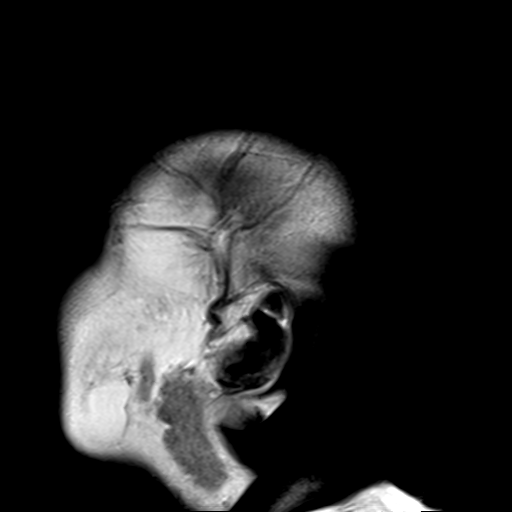
[im 9/27]
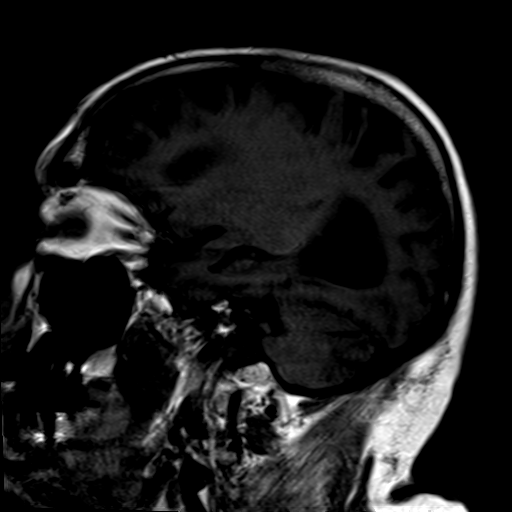
[im 18/27]
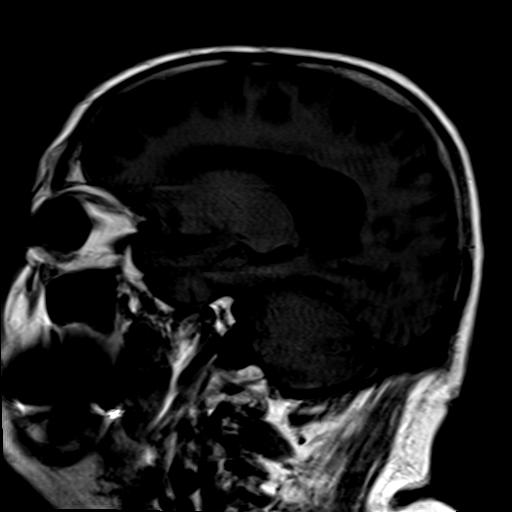
[im 27/27]
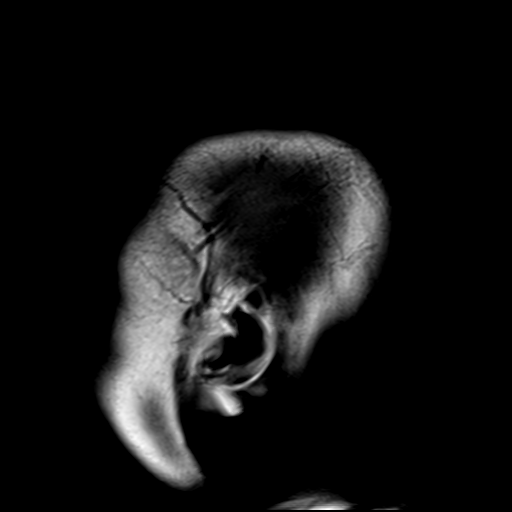

[Series 4: DWI · axial · 3.0mm · 1.80mm/px · z∈[-41,+130]mm · 6 of 45 slices shown (1 of 4)]
[im 1/45]
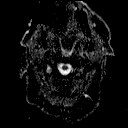
[im 9/45]
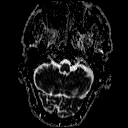
[im 18/45]
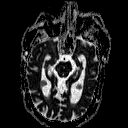
[im 27/45]
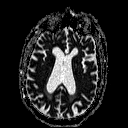
[im 36/45]
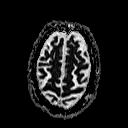
[im 45/45]
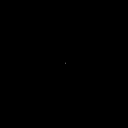

[Series 6: DWI · coronal · 3.0mm · 1.80mm/px · 8 of 66 slices shown (2 of 4)]
[im 1/66]
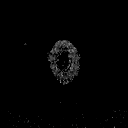
[im 10/66]
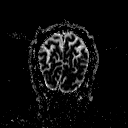
[im 19/66]
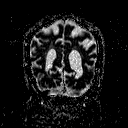
[im 28/66]
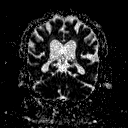
[im 38/66]
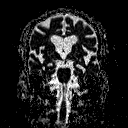
[im 47/66]
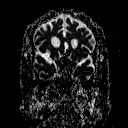
[im 56/66]
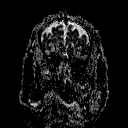
[im 66/66]
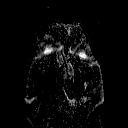

[Series 7: T2 · axial · 5.0mm · 0.45mm/px · z∈[-40,+128]mm · 3 of 27 slices shown (1 of 3)]
[im 1/27]
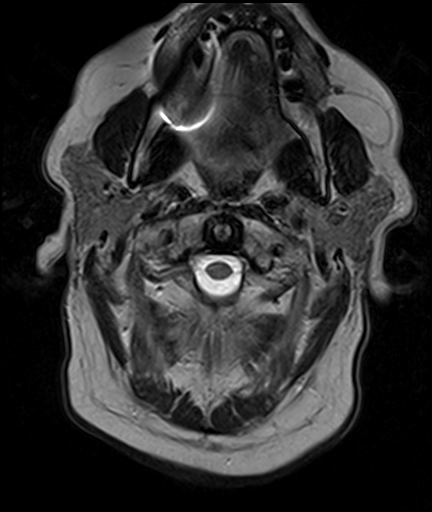
[im 14/27]
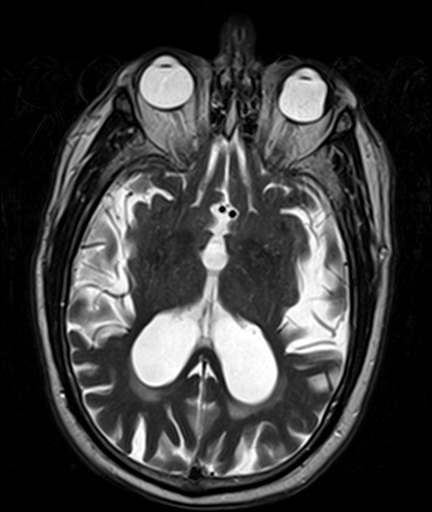
[im 27/27]
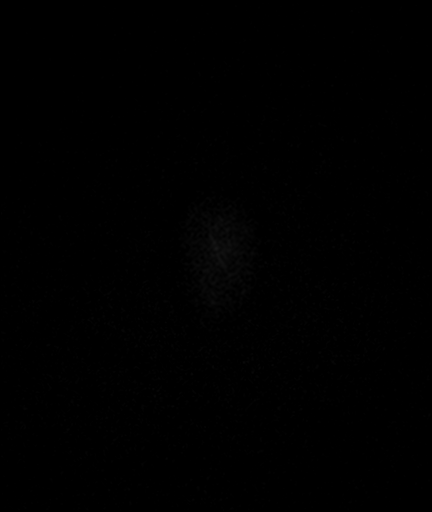

[Series 8: FLAIR · axial · 5.0mm · 0.45mm/px · z∈[-41,+128]mm · 3 of 27 slices shown]
[im 1/27]
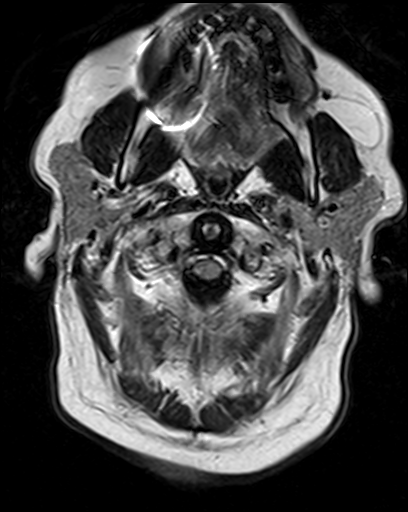
[im 14/27]
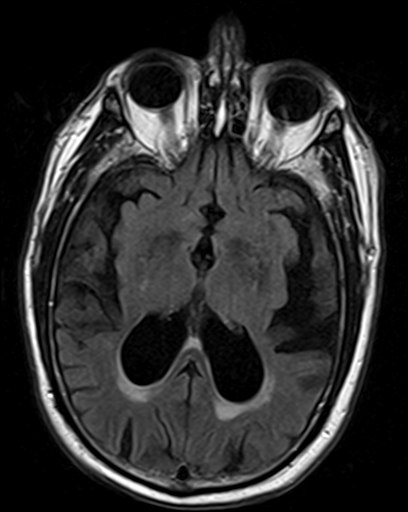
[im 27/27]
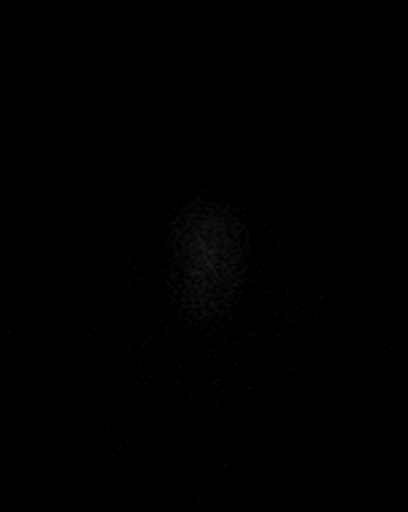

[Series 9: T2 · axial · 5.0mm · 1.20mm/px · z∈[-40,+129]mm · 3 of 27 slices shown (2 of 3)]
[im 1/27]
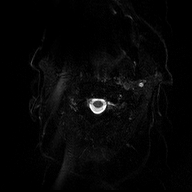
[im 14/27]
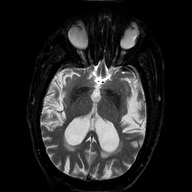
[im 27/27]
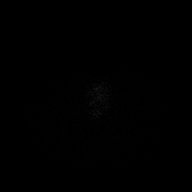

[Series 10: GRE · axial · 5.0mm · 0.45mm/px · z∈[-40,+129]mm · 3 of 27 slices shown]
[im 1/27]
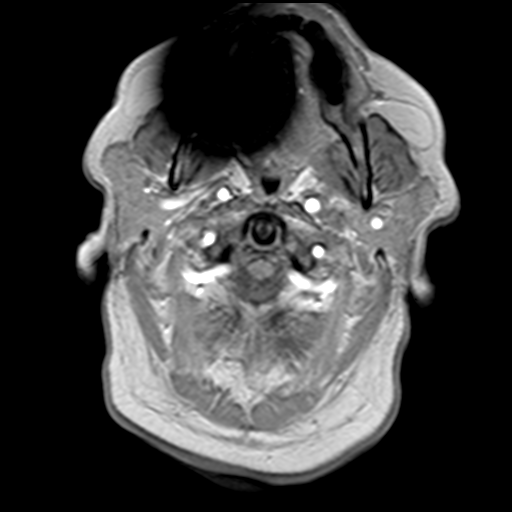
[im 14/27]
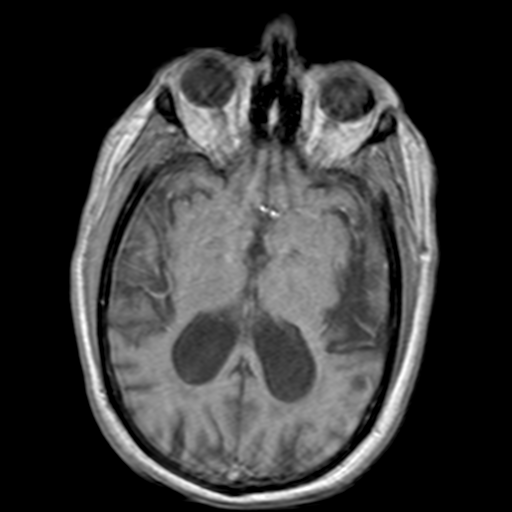
[im 27/27]
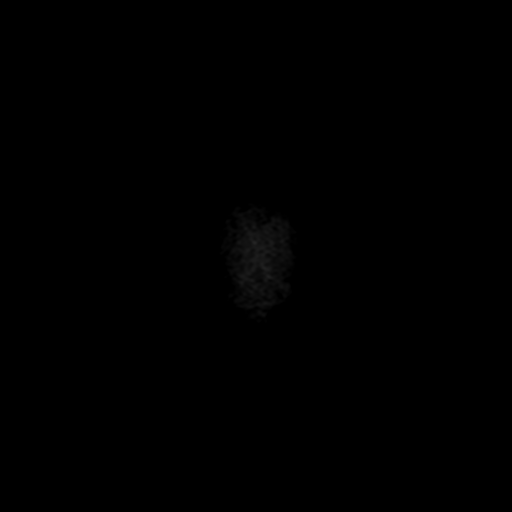

[Series 11: T2 · coronal · 5.0mm · 0.45mm/px · 4 of 31 slices shown (3 of 3)]
[im 1/31]
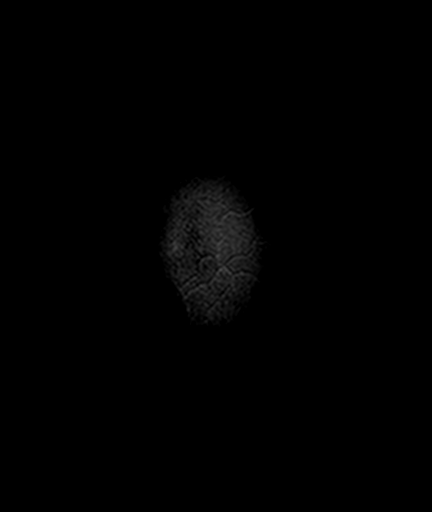
[im 11/31]
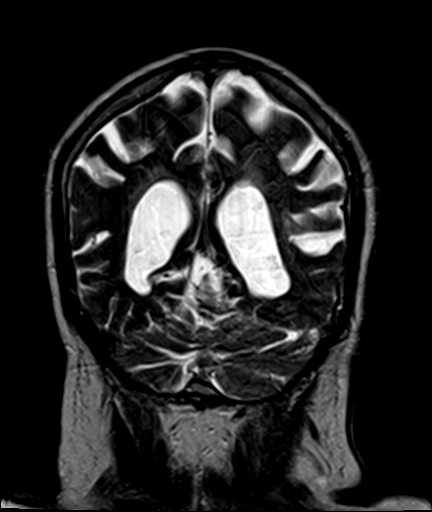
[im 21/31]
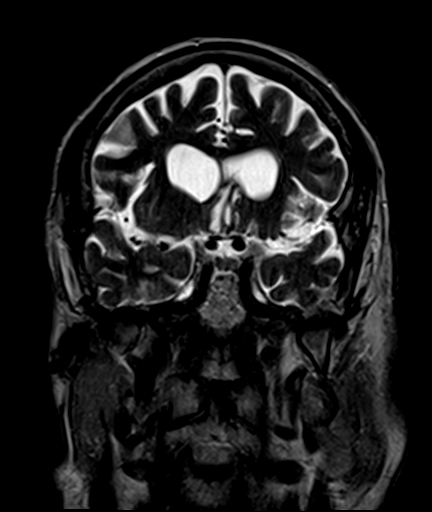
[im 31/31]
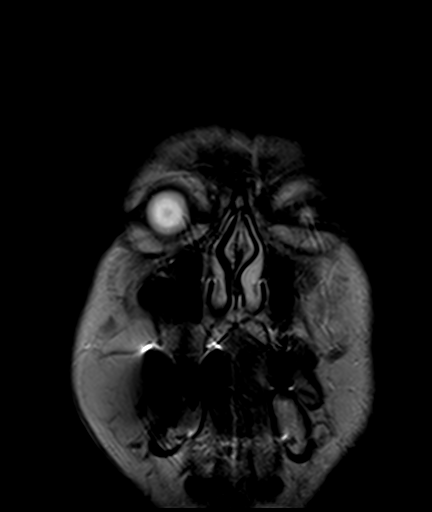

[Series 100: DWI · axial · 3.0mm · 1.80mm/px · z∈[-41,+130]mm · 6 of 45 slices shown (3 of 4)]
[im 1/45]
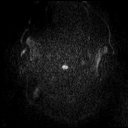
[im 9/45]
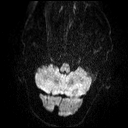
[im 18/45]
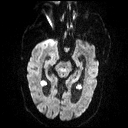
[im 27/45]
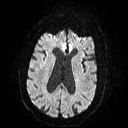
[im 36/45]
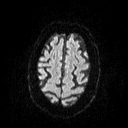
[im 45/45]
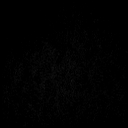

[Series 101: DWI · coronal · 3.0mm · 1.80mm/px · 8 of 65 slices shown (4 of 4)]
[im 1/65]
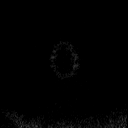
[im 10/65]
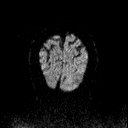
[im 19/65]
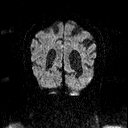
[im 28/65]
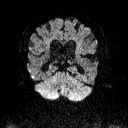
[im 37/65]
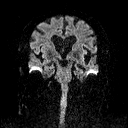
[im 46/65]
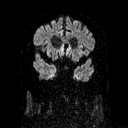
[im 55/65]
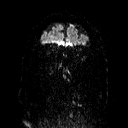
[im 65/65]
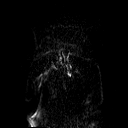

[48 of 48 positions shown; findings below may reference images not displayed]

FINDINGS: Images are mildly degraded by motion artifact.

There is no evidence of acute infarct, intracranial hemorrhage,
mass, midline shift, or extra-axial fluid collection. There is
moderate generalized cerebral atrophy, similar to slightly increased
compared to the prior MRI. Periventricular white matter T2
hyperintensities do not appear significantly changed and are
nonspecific but compatible with mild chronic small vessel ischemic
disease.

Prior bilateral cataract extraction and left scleral banding are
noted. Paranasal sinuses are clear. There is trace bilateral mastoid
air cell fluid. Major intracranial vascular flow voids are
preserved.
IMPRESSION: 1. No acute intracranial abnormality.
2. Mild chronic small vessel ischemic disease and moderate cerebral
atrophy.

## 2016-12-18 ENCOUNTER — Other Ambulatory Visit: Payer: Self-pay | Admitting: Cardiovascular Disease

## 2016-12-25 ENCOUNTER — Other Ambulatory Visit: Payer: Self-pay | Admitting: Cardiovascular Disease

## 2017-01-09 ENCOUNTER — Ambulatory Visit (INDEPENDENT_AMBULATORY_CARE_PROVIDER_SITE_OTHER): Payer: Medicare Other | Admitting: Unknown Physician Specialty

## 2017-01-09 ENCOUNTER — Encounter: Payer: Self-pay | Admitting: Unknown Physician Specialty

## 2017-01-09 VITALS — BP 127/71 | HR 70 | Temp 98.5°F | Wt 183.4 lb

## 2017-01-09 DIAGNOSIS — R21 Rash and other nonspecific skin eruption: Secondary | ICD-10-CM | POA: Diagnosis not present

## 2017-01-09 MED ORDER — VALACYCLOVIR HCL 1 G PO TABS: 1000.0000 mg | ORAL_TABLET | Freq: Three times a day (TID) | ORAL | 0 refills | Status: DC

## 2017-01-09 MED ORDER — BETAMETHASONE DIPROPIONATE AUG 0.05 % EX CREA: TOPICAL_CREAM | Freq: Two times a day (BID) | CUTANEOUS | 0 refills | Status: DC

## 2017-01-09 NOTE — Progress Notes (Signed)
   BP 127/71   Pulse 70   Temp 98.5 F (36.9 C)   Wt 183 lb 6.4 oz (83.2 kg)   SpO2 96%   BMI 27.89 kg/m    Subjective:    Patient ID: Timothy [name], male    DOB: [birth year], 83 y.o.   MRN: [id number]  HPI: Timothy [name] is a 83 y.o. male  Chief Complaint  Patient presents with  . Rash    pt states he noticed a rash on the top of his back the first of this week, wonders if it may be the shingles    Pt is here for rash on left top of his back.  Pt worries it is shingles.  Left arm is hurting for one month.  Area on his back without pain but without itching.     Relevant past medical, surgical, family and social history reviewed and updated as indicated. Interim medical history since our last visit reviewed. Allergies and medications reviewed and updated.  Review of Systems  Per HPI unless specifically indicated above     Objective:    BP 127/71   Pulse 70   Temp 98.5 F (36.9 C)   Wt 183 lb 6.4 oz (83.2 kg)   SpO2 96%   BMI 27.89 kg/m   Wt Readings from Last 3 Encounters:  01/09/17 183 lb 6.4 oz (83.2 kg)  11/27/16 184 lb 3.2 oz (83.6 kg)  09/30/16 185 lb 12.8 oz (84.3 kg)    Physical Exam  Constitutional: He is oriented to person, place, and time. He appears well-developed and well-nourished. No distress.  HENT:  Head: Normocephalic and atraumatic.  Eyes: Conjunctivae and lids are normal. Right eye exhibits no discharge. Left eye exhibits no discharge. No scleral icterus.  Neck: Normal range of motion. Neck supple. No JVD present. Carotid bruit is not present.  Cardiovascular: Normal rate, regular rhythm and normal heart sounds.   Pulmonary/Chest: Effort normal and breath sounds normal. No respiratory distress.  Abdominal: Normal appearance. There is no splenomegaly or hepatomegaly.  Musculoskeletal: Normal range of motion.  Neurological: He is alert and oriented to person, place, and time.  Skin: Skin is warm, dry and intact. No rash noted. No pallor.  Pt with  erythemetous patch left shoulder about 4x4 area.    Psychiatric: He has a normal mood and affect. His behavior is normal. Judgment and thought content normal.       Assessment & Plan:   Problem List Items Addressed This Visit    None    Visit Diagnoses    Rash    -  Primary   Non-specific patch of urticaria left shouder.  Will treat for shingles just in case. Diprolene for itching.         Follow up plan: Return if symptoms worsen or fail to improve.

## 2017-01-15 ENCOUNTER — Other Ambulatory Visit: Payer: Self-pay | Admitting: Family Medicine

## 2017-02-03 ENCOUNTER — Telehealth: Payer: Self-pay | Admitting: Family Medicine

## 2017-02-03 NOTE — Telephone Encounter (Signed)
Patient's daughter would like to speak with provider regarding father. Patient needing a medication for mood/anxiety due to wife dying.   Please Advise.  Thank you

## 2017-02-04 ENCOUNTER — Other Ambulatory Visit: Payer: Self-pay | Admitting: Family Medicine

## 2017-02-04 MED ORDER — CLONAZEPAM 0.5 MG PO TABS: 0.5000 mg | ORAL_TABLET | Freq: Two times a day (BID) | ORAL | 0 refills | Status: DC | PRN

## 2017-02-04 NOTE — Telephone Encounter (Signed)
Patient was transferred to provider for telephone conversation.   

## 2017-02-04 NOTE — Telephone Encounter (Signed)
Phone call Discussed with daughter patient's wife is dying over the next 2 weeks or so. Working with hospice. Patient has severe anxiety issues will give clonazepam discussed risk of this class of medications especially with falling cautions will be made using only for severe anxiety.

## 2017-02-13 DIAGNOSIS — B029 Zoster without complications: Secondary | ICD-10-CM | POA: Diagnosis not present

## 2017-02-16 ENCOUNTER — Emergency Department: Payer: Medicare Other

## 2017-02-16 ENCOUNTER — Emergency Department
Admission: EM | Admit: 2017-02-16 | Discharge: 2017-02-16 | Disposition: A | Discharge status: Home / Self Care | Payer: Medicare Other | Attending: Emergency Medicine | Admitting: Emergency Medicine

## 2017-02-16 ENCOUNTER — Encounter: Payer: Self-pay | Admitting: Emergency Medicine

## 2017-02-16 DIAGNOSIS — I110 Hypertensive heart disease with heart failure: Secondary | ICD-10-CM | POA: Diagnosis not present

## 2017-02-16 DIAGNOSIS — Y939 Activity, unspecified: Secondary | ICD-10-CM | POA: Insufficient documentation

## 2017-02-16 DIAGNOSIS — Z7901 Long term (current) use of anticoagulants: Secondary | ICD-10-CM | POA: Insufficient documentation

## 2017-02-16 DIAGNOSIS — W19XXXA Unspecified fall, initial encounter: Secondary | ICD-10-CM

## 2017-02-16 DIAGNOSIS — M25512 Pain in left shoulder: Secondary | ICD-10-CM | POA: Insufficient documentation

## 2017-02-16 DIAGNOSIS — W010XXA Fall on same level from slipping, tripping and stumbling without subsequent striking against object, initial encounter: Secondary | ICD-10-CM | POA: Insufficient documentation

## 2017-02-16 DIAGNOSIS — Z7984 Long term (current) use of oral hypoglycemic drugs: Secondary | ICD-10-CM | POA: Insufficient documentation

## 2017-02-16 DIAGNOSIS — S0990XA Unspecified injury of head, initial encounter: Secondary | ICD-10-CM | POA: Diagnosis not present

## 2017-02-16 DIAGNOSIS — I509 Heart failure, unspecified: Secondary | ICD-10-CM | POA: Diagnosis not present

## 2017-02-16 DIAGNOSIS — S0001XA Abrasion of scalp, initial encounter: Secondary | ICD-10-CM

## 2017-02-16 DIAGNOSIS — S4992XA Unspecified injury of left shoulder and upper arm, initial encounter: Secondary | ICD-10-CM | POA: Diagnosis not present

## 2017-02-16 DIAGNOSIS — Y929 Unspecified place or not applicable: Secondary | ICD-10-CM | POA: Diagnosis not present

## 2017-02-16 DIAGNOSIS — Y999 Unspecified external cause status: Secondary | ICD-10-CM | POA: Diagnosis not present

## 2017-02-16 DIAGNOSIS — E119 Type 2 diabetes mellitus without complications: Secondary | ICD-10-CM | POA: Insufficient documentation

## 2017-02-16 MED ORDER — ACETAMINOPHEN 500 MG PO TABS
ORAL_TABLET | ORAL | Status: AC
  Filled 2017-02-16: qty 2

## 2017-02-16 MED ORDER — ACETAMINOPHEN 500 MG PO TABS
1000.0000 mg | ORAL_TABLET | Freq: Once | ORAL | Status: AC
  Administered 2017-02-16: 1000 mg via ORAL

## 2017-02-16 MED ORDER — BACITRACIN ZINC 500 UNIT/GM EX OINT
TOPICAL_OINTMENT | Freq: Once | CUTANEOUS | Status: AC
  Administered 2017-02-16: 1 via TOPICAL

## 2017-02-16 MED ORDER — BACITRACIN ZINC 500 UNIT/GM EX OINT
TOPICAL_OINTMENT | CUTANEOUS | Status: AC
  Filled 2017-02-16: qty 0.9

## 2017-02-16 NOTE — Discharge Instructions (Signed)
You may take Tylenol as needed for discomfort. Return to the ER for worsening symptoms, persistent vomiting, lethargy or other concerns.

## 2017-02-16 NOTE — ED Triage Notes (Signed)
Pt states he tripped over his walker and fell backwards on hardwood floor striking posterior scalp. Pt denies loc. Pt also was diagnosed with shingles to left shoulder 2 days pta. Cms intact in all extremities.

## 2017-02-16 NOTE — Progress Notes (Signed)
CH responded to a PG for ED-25. This 83-yr-old Pt has lost his wife two days ago, who's service is at 3:00 this afternoon.  Pt fell this morning as he was going to the bathroom and hit his head. Pt is inquisitive as to how he can donate his brain to science. His daughter states this a new idea as of this morning. [place] asked the RN and other staff and could not come up with an answer. Daughter stated the family would need to talk this over. CH provided prayer and offered condolences for his loss.     02/16/17 0500  Clinical Encounter Type  Visited With Patient;Patient and family together;Health care provider  Visit Type Initial;Spiritual support;ED  Referral From Nurse  Consult/Referral To Chaplain  Spiritual Encounters  Spiritual Needs Prayer

## 2017-02-16 NOTE — ED Provider Notes (Signed)
[facility] Emergency Department Provider Note   ____________________________________________   First MD Initiated Contact with Patient 02/16/17 0430     (approximate)  I have reviewed the triage vital signs and the nursing notes.   HISTORY  Chief Complaint Fall    HPI JAMESEN STAHNKE is a 83 y.o. male presents to the ED from home with a chief complaint of mechanical fall with head and left shoulder pain. Patient was diagnosed with shingles to his left shoulder 3 days ago. Likely also has cervical radiculopathy per clinic notes. His spouse died 3 days ago. This morning patientgot up to use restroom, tripped over his walker and fell backwards onto the hardwood floor, striking his posterior head. Patient takes Eliquis for paroxysmal atrial fibrillation. Denies LOC or neck pain. Complains of left shoulder pain. Unsure if pain stems from his shingles or if he has injured his shoulder secondary to fall. Daughter states her mother's funeral is today and wanted evaluation to make sure everything is okay for her father to attend the funeral. Denies recent fever, chills, chest pain, shortness of breath, abdominal pain, nausea, vomiting.   Past Medical History:  Diagnosis Date  . Back pain   . CHF (congestive heart failure) ([place])   . Esophageal reflux   . 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 Senile osteoporosis   . Skin cancer   . TIA (transient ischemic attack)     Patient Active Problem List   Diagnosis Date Noted  . Advanced care planning/counseling discussion 11/27/2016  . Pernicious anemia 05/28/2015  . Leg weakness, bilateral 04/12/2015  . Tremor 03/27/2015  . Chronic anxiety 02/20/2015  . SVT (supraventricular tachycardia) ([place]) 02/16/2015  . Paroxysmal atrial fibrillation ([place]) 02/16/2015  . History of back surgery 02/16/2015  . AAA (abdominal aortic aneurysm) without rupture ([place]) 10/07/2012  . TIA (transient  ischemic attack) 12/29/2011  . HLD (hyperlipidemia) 09/24/2011  . Essential hypertension 09/24/2011  . DM type 2 (diabetes mellitus, type 2) ([place]) 09/24/2011    Past Surgical History:  Procedure Laterality Date  . APPENDECTOMY    . BACK SURGERY    . CATARACT EXTRACTION    . COLONOSCOPY  [id number]  . ESOPHAGEAL DILATION    . GALLBLADDER SURGERY    . HERNIA REPAIR    . RETINAL DETACHMENT SURGERY    . TONSILLECTOMY      Prior to Admission medications   Medication Sig Start Date End Date Taking? Authorizing Provider  acetaminophen (TYLENOL) 500 MG tablet Take 1,000 mg by mouth every 8 (eight) hours as needed.     [provider]  apixaban (ELIQUIS) 5 MG TABS tablet Take 1 tablet (5 mg total) by mouth 2 (two) times daily. 11/27/16   Guadalupe Maple, MD  augmented betamethasone dipropionate (DIPROLENE AF) 0.05 % cream Apply topically 2 (two) times daily. 01/09/17   Kathrine Haddock, NP  benazepril (LOTENSIN) 5 MG tablet Take 1 tablet (5 mg total) by mouth daily. 11/27/16   Guadalupe Maple, MD  Calcium Citrate-Vitamin D (CALCIUM + D PO) Take 1 tablet by mouth 2 (two) times daily.     [provider]  carbidopa-levodopa (SINEMET) 25-100 MG tablet Take 100 mg by mouth 4 (four) times daily.     [provider]  clonazePAM (KLONOPIN) 0.5 MG tablet Take 1 tablet (0.5 mg total) by mouth 2 (two) times daily as needed for anxiety. 02/04/17   Guadalupe Maple, MD  cyanocobalamin (,VITAMIN B-12,) 1000 MCG/ML injection Inject 1 mL (1,000 mcg total) into the muscle every 30 (thirty) days. 11/27/16   Guadalupe Maple, MD  dapagliflozin propanediol (FARXIGA) 10 MG TABS tablet Take 10 mg by mouth daily. 11/27/16   Guadalupe Maple, MD  digoxin (LANOXIN) 0.125 MG tablet Take 1 tablet (125 mcg total) by mouth daily. 03/21/16 03/21/17  [name], MD  diltiazem (CARDIZEM) 30 MG tablet Take 1 tablet (30 mg total) by mouth 3 (three) times daily as needed. 02/16/15   [name], MD    flecainide (TAMBOCOR) 50 MG tablet TAKE 1 TABLET BY MOUTH TWICE DAILY 12/18/16   [name], MD  flecainide (TAMBOCOR) 50 MG tablet TAKE 1 TABLET BY MOUTH TWICE DAILY 12/25/16   [name], MD  glipiZIDE-metformin (METAGLIP) 5-500 MG tablet Take 1 tablet by mouth 2 (two) times daily. 11/27/16   Guadalupe Maple, MD  GLUCOSAMINE-CHONDROITIN-VIT D3 PO Take by mouth daily.    [provider]  hydrocortisone 2.5 % cream Apply 1 application topically as needed.  08/13/15   [provider]  metoprolol succinate (TOPROL-XL) 50 MG 24 hr tablet Take 1 tablet (50 mg total) by mouth daily. Take with or immediately following a meal. 11/27/16   Crissman, Jeannette How, MD  Multiple Vitamins-Minerals (CEROVITE SENIOR PO) Take 1 tablet by mouth daily.    [provider]  pantoprazole (PROTONIX) 40 MG tablet TAKE ONE TABLET TWICE DAILY 06/23/16   Guadalupe Maple, MD  Probiotic Product ([place]) Take by mouth.    [provider]  rosuvastatin (CRESTOR) 10 MG tablet Take 1 tablet (10 mg total) by mouth daily. 11/27/16   Guadalupe Maple, MD  sitaGLIPtin (JANUVIA) 100 MG tablet Take 1 tablet (100 mg total) by mouth daily. 11/27/16   Guadalupe Maple, MD  tamsulosin (FLOMAX) 0.4 MG CAPS capsule Take 1 capsule (0.4 mg total) by mouth daily. 09/21/16   Schaevitz, Randall An, MD  torsemide (DEMADEX) 20 MG tablet Take 20 mg by mouth daily.    [provider]  valACYclovir (VALTREX) 1000 MG tablet Take 1 tablet (1,000 mg total) by mouth 3 (three) times daily. 01/09/17   Kathrine Haddock, NP    Allergies Patient has no known allergies.  Family History  Problem Relation Age of Onset  . Heart disease Mother   . Diabetes Unknown        grandparents  . Heart disease Brother   . Stroke Brother     Social History Social History  Substance Use Topics  . Smoking status: Never Smoker  . Smokeless tobacco: Never Used  . Alcohol use No    Review of  Systems  Constitutional: No fever/chills. Eyes: No visual changes. ENT: No sore throat. Cardiovascular: Denies chest pain. Respiratory: Denies shortness of breath. Gastrointestinal: No abdominal pain.  No nausea, no vomiting.  No diarrhea.  No constipation. Genitourinary: Negative for dysuria. Musculoskeletal: Positive for left shoulder pain. Negative for back pain. Skin: Negative for rash. Neurological: Negative for headaches, focal weakness or numbness.   ____________________________________________   PHYSICAL EXAM:  VITAL SIGNS: ED Triage Vitals  Enc Vitals Group     BP 02/16/17 0420 (!) 157/75     Pulse Rate 02/16/17 0420 60     Resp 02/16/17 0420 14     Temp 02/16/17 0420 98.3 F (36.8 C)     Temp Source 02/16/17 0420 Oral     SpO2 02/16/17 0420 98 %  Weight 02/16/17 0421 180 lb (81.6 kg)     Height 02/16/17 0421 5\' 10"  (1.778 m)     Head Circumference --      Peak Flow --      Pain Score 02/16/17 0420 9     Pain Loc --      Pain Edu? --      Excl. in [place]? --     Constitutional: Alert and oriented. Well appearing and in no acute distress. Eyes: Conjunctivae are normal. PERRL. EOMI. Head: Quarter-sized abrasion to posterior scalp without active bleeding. Nose: No congestion/rhinnorhea. Mouth/Throat: Mucous membranes are moist.  Oropharynx non-erythematous. Neck: No stridor.  No cervical spine tenderness to palpation. Left neck/trapezius with scattered vesicles. Cardiovascular: Normal rate, regular rhythm. Grossly normal heart sounds.  Good peripheral circulation. Respiratory: Normal respiratory effort.  No retractions. Lungs CTAB. Gastrointestinal: Soft and nontender. No distention. No abdominal bruits. No CVA tenderness. Musculoskeletal: Left shoulder tender to palpation without obvious deformity. FROM with some pain. 2+ radial pulses. Brisk, less than 5 second capillary refill. Pelvis stable. No lower extremity tenderness nor edema.  No joint  effusions. Neurologic:  Normal speech and language. No gross focal neurologic deficits are appreciated. No gait instability. Skin:  Skin is warm, dry and intact. No rash noted. Psychiatric: Mood and affect are normal. Speech and behavior are normal.  ____________________________________________   LABS (all labs ordered are listed, but only abnormal results are displayed)  Labs Reviewed - No data to display ____________________________________________  EKG  None ____________________________________________  RADIOLOGY  Ct Head Wo Contrast  Result Date: 02/16/2017 CLINICAL DATA:  Status post fall backwards, hitting back of head. Initial encounter. EXAM: CT HEAD WITHOUT CONTRAST TECHNIQUE: Contiguous axial images were obtained from the base of the skull through the vertex without intravenous contrast. COMPARISON:  CT of the head performed 09/08/2014, and MRI of the brain performed 09/09/2014 FINDINGS: Brain: No evidence of acute infarction, hemorrhage, hydrocephalus, extra-axial collection or mass lesion/mass effect. Prominence of the ventricles and sulci reflects moderate cortical volume loss. Mild cerebellar atrophy is noted. Mild periventricular white matter change likely reflects small vessel ischemic microangiopathy. The brainstem and fourth ventricle are within normal limits. The basal ganglia are unremarkable in appearance. The cerebral hemispheres demonstrate grossly normal gray-white differentiation. No mass effect or midline shift is seen. Vascular: No hyperdense vessel or unexpected calcification. Skull: There is no evidence of fracture; visualized osseous structures are unremarkable in appearance. Sinuses/Orbits: The orbits are within normal limits. The paranasal sinuses and mastoid air cells are well-aerated. Other: No significant soft tissue abnormalities are seen. IMPRESSION: 1. No evidence of traumatic intracranial injury or fracture. 2. Moderate cortical volume loss and scattered  small vessel ischemic microangiopathy. Electronically Signed   By: Garald Balding M.D.   On: 02/16/2017 05:52   Dg Shoulder Left  Result Date: 02/16/2017 CLINICAL DATA:  Status post fall, with left shoulder pain and limited range of motion. Initial encounter. EXAM: LEFT SHOULDER - 2+ VIEW COMPARISON:  None. FINDINGS: There is no evidence of fracture or dislocation. The left humeral head is seated within the glenoid fossa. Mild degenerative change is noted at the left acromioclavicular joint. No significant soft tissue abnormalities are seen. The visualized portions of the left lung are clear. IMPRESSION: No evidence of fracture or dislocation. Electronically Signed   By: Garald Balding M.D.   On: 02/16/2017 06:09    ____________________________________________   PROCEDURES  Procedure(s) performed: None  Procedures  Critical Care performed: No  ____________________________________________  INITIAL IMPRESSION / ASSESSMENT AND PLAN / ED COURSE  Pertinent labs & imaging results that were available during my care of the patient were reviewed by me and considered in my medical decision making (see chart for details).  82 year old male who presents status post mechanical fall with minor abrasion to posterior scalp and left shoulder pain which has pre-existing shingles. On Eliquis. Will obtain CT head and left shoulder x-ray. Tylenol administered for pain.  Clinical Course as of Feb 16 622  Mon Feb 16, 2017  6808 Updated patient and daughter of imaging results. Strict return precautions given. Both verbalize understanding and agree with plan of care.  [JS]    Clinical Course User Index [JS] Paulette Blanch, MD     ____________________________________________   FINAL CLINICAL IMPRESSION(S) / ED DIAGNOSES  Final diagnoses:  Fall, initial encounter  Abrasion of scalp, initial encounter  Acute pain of left shoulder      NEW MEDICATIONS STARTED DURING THIS VISIT:  New  Prescriptions   No medications on file     Note:  This document was prepared using Dragon voice recognition software and may include unintentional dictation errors.    Paulette Blanch, MD 02/16/17 [phone]

## 2017-03-13 ENCOUNTER — Other Ambulatory Visit: Payer: Self-pay | Admitting: Cardiovascular Disease

## 2017-03-17 DIAGNOSIS — R42 Dizziness and giddiness: Secondary | ICD-10-CM | POA: Diagnosis not present

## 2017-03-26 DIAGNOSIS — R262 Difficulty in walking, not elsewhere classified: Secondary | ICD-10-CM | POA: Diagnosis not present

## 2017-03-26 DIAGNOSIS — M6281 Muscle weakness (generalized): Secondary | ICD-10-CM | POA: Diagnosis not present

## 2017-03-26 DIAGNOSIS — R2681 Unsteadiness on feet: Secondary | ICD-10-CM | POA: Diagnosis not present

## 2017-03-26 DIAGNOSIS — R293 Abnormal posture: Secondary | ICD-10-CM | POA: Diagnosis not present

## 2017-04-02 ENCOUNTER — Ambulatory Visit (INDEPENDENT_AMBULATORY_CARE_PROVIDER_SITE_OTHER): Payer: Medicare Other | Admitting: Family Medicine

## 2017-04-02 ENCOUNTER — Encounter: Payer: Self-pay | Admitting: Family Medicine

## 2017-04-02 ENCOUNTER — Other Ambulatory Visit: Payer: Self-pay | Admitting: Cardiovascular Disease

## 2017-04-02 VITALS — BP 130/70 | HR 66 | Wt 180.0 lb

## 2017-04-02 DIAGNOSIS — Z9889 Other specified postprocedural states: Secondary | ICD-10-CM | POA: Diagnosis not present

## 2017-04-02 DIAGNOSIS — D1800 Hemangioma unspecified site: Secondary | ICD-10-CM | POA: Diagnosis not present

## 2017-04-02 DIAGNOSIS — S0100XA Unspecified open wound of scalp, initial encounter: Secondary | ICD-10-CM | POA: Diagnosis not present

## 2017-04-02 DIAGNOSIS — E1159 Type 2 diabetes mellitus with other circulatory complications: Secondary | ICD-10-CM

## 2017-04-02 DIAGNOSIS — L570 Actinic keratosis: Secondary | ICD-10-CM | POA: Diagnosis not present

## 2017-04-02 DIAGNOSIS — I10 Essential (primary) hypertension: Secondary | ICD-10-CM

## 2017-04-02 DIAGNOSIS — L821 Other seborrheic keratosis: Secondary | ICD-10-CM | POA: Diagnosis not present

## 2017-04-02 DIAGNOSIS — W19XXXA Unspecified fall, initial encounter: Secondary | ICD-10-CM | POA: Diagnosis not present

## 2017-04-02 DIAGNOSIS — I480 Paroxysmal atrial fibrillation: Secondary | ICD-10-CM

## 2017-04-02 DIAGNOSIS — L308 Other specified dermatitis: Secondary | ICD-10-CM | POA: Diagnosis not present

## 2017-04-02 DIAGNOSIS — C4492 Squamous cell carcinoma of skin, unspecified: Secondary | ICD-10-CM

## 2017-04-02 DIAGNOSIS — D485 Neoplasm of uncertain behavior of skin: Secondary | ICD-10-CM | POA: Diagnosis not present

## 2017-04-02 DIAGNOSIS — Z85828 Personal history of other malignant neoplasm of skin: Secondary | ICD-10-CM | POA: Diagnosis not present

## 2017-04-02 DIAGNOSIS — L820 Inflamed seborrheic keratosis: Secondary | ICD-10-CM | POA: Diagnosis not present

## 2017-04-02 DIAGNOSIS — D0439 Carcinoma in situ of skin of other parts of face: Secondary | ICD-10-CM | POA: Diagnosis not present

## 2017-04-02 DIAGNOSIS — D225 Melanocytic nevi of trunk: Secondary | ICD-10-CM | POA: Diagnosis not present

## 2017-04-02 DIAGNOSIS — L080 Pyoderma: Secondary | ICD-10-CM | POA: Diagnosis not present

## 2017-04-02 HISTORY — DX: Squamous cell carcinoma of skin, unspecified: C44.92

## 2017-04-02 LAB — BAYER DCA HB A1C WAIVED: HB A1C (BAYER DCA - WAIVED): 8.2 % — ABNORMAL HIGH (ref <7.0)

## 2017-04-02 NOTE — Progress Notes (Signed)
BP 130/70   Pulse 66   Wt 180 lb (81.6 kg)   SpO2 97%   BMI 25.83 kg/m    Subjective:    Patient ID: Timothy [name], male    DOB: Oct 01, 1928, 83 y.o.   MRN: [id number]  HPI: Timothy [name] is a 83 y.o. male  Chief Complaint  Patient presents with  . Follow-up  . Fall  Patient accompanied by his daughter who assists with history. Patient's wife died last month. Patient during this time ate poorly and had a fall. Patient recovered from this fall, but still has some residual back pain. Bruising in the left arm and shoulder has resolved. Patient now walking again able to walk without problems and no increasing back pain or symptoms. Reviewed medication with no changes.  Relevant past medical, surgical, family and social history reviewed and updated as indicated. Interim medical history since our last visit reviewed. Allergies and medications reviewed and updated.  Review of Systems  Constitutional: Negative.   Respiratory: Negative.   Cardiovascular: Negative.     Per HPI unless specifically indicated above     Objective:    BP 130/70   Pulse 66   Wt 180 lb (81.6 kg)   SpO2 97%   BMI 25.83 kg/m   Wt Readings from Last 3 Encounters:  04/02/17 180 lb (81.6 kg)  02/16/17 180 lb (81.6 kg)  01/09/17 183 lb 6.4 oz (83.2 kg)    Physical Exam  Constitutional: He is oriented to person, place, and time. He appears well-developed and well-nourished.  HENT:  Head: Normocephalic and atraumatic.  Eyes: Conjunctivae and EOM are normal.  Neck: Normal range of motion.  Cardiovascular: Normal rate, regular rhythm and normal heart sounds.   Pulmonary/Chest: Effort normal and breath sounds normal.  Musculoskeletal: Normal range of motion.  Neurological: He is alert and oriented to person, place, and time.  Skin: No erythema.  Psychiatric: He has a normal mood and affect. His behavior is normal. Judgment and thought content normal.    Results for orders placed or performed in visit  on 11/27/16  Microscopic Examination  Result Value Ref Range   WBC, UA None seen 0 - 5 /hpf   RBC, UA None seen 0 - 2 /hpf   Epithelial Cells (non renal) CANCELED    Bacteria, UA None seen None seen/Few  CBC with Differential/Platelet  Result Value Ref Range   WBC 8.0 3.4 - 10.8 x10E3/uL   RBC 3.95 (L) 4.14 - 5.80 x10E6/uL   Hemoglobin 11.9 (L) 13.0 - 17.7 g/dL   Hematocrit 36.5 (L) 37.5 - 51.0 %   MCV 92 79 - 97 fL   MCH 30.1 26.6 - 33.0 pg   MCHC 32.6 31.5 - 35.7 g/dL   RDW 14.3 12.3 - 15.4 %   Platelets 209 150 - 379 x10E3/uL   Neutrophils 56 Not Estab. %   Lymphs 27 Not Estab. %   Monocytes 10 Not Estab. %   Eos 5 Not Estab. %   Basos 1 Not Estab. %   Neutrophils Absolute 4.6 1.4 - 7.0 x10E3/uL   Lymphocytes Absolute 2.2 0.7 - 3.1 x10E3/uL   Monocytes Absolute 0.8 0.1 - 0.9 x10E3/uL   EOS (ABSOLUTE) 0.4 0.0 - 0.4 x10E3/uL   Basophils Absolute 0.0 0.0 - 0.2 x10E3/uL   Immature Granulocytes 1 Not Estab. %   Immature Grans (Abs) 0.1 0.0 - 0.1 x10E3/uL  Comprehensive metabolic panel  Result Value Ref Range   Glucose 144 (  H) 65 - 99 mg/dL   BUN 21 8 - 27 mg/dL   Creatinine, Ser 1.40 (H) 0.76 - 1.27 mg/dL   GFR calc non Af Amer 45 (L) >59 mL/min/1.73   GFR calc Af Amer 53 (L) >59 mL/min/1.73   BUN/Creatinine Ratio 15 10 - 24   Sodium 135 134 - 144 mmol/L   Potassium 4.4 3.5 - 5.2 mmol/L   Chloride 100 96 - 106 mmol/L   CO2 21 18 - 29 mmol/L   Calcium 9.2 8.6 - 10.2 mg/dL   Total Protein 7.2 6.0 - 8.5 g/dL   Albumin 4.5 3.5 - 4.7 g/dL   Globulin, Total 2.7 1.5 - 4.5 g/dL   Albumin/Globulin Ratio 1.7 1.2 - 2.2   Bilirubin Total 0.5 0.0 - 1.2 mg/dL   Alkaline Phosphatase 45 39 - 117 IU/L   AST 22 0 - 40 IU/L   ALT 11 0 - 44 IU/L  Lipid panel  Result Value Ref Range   Cholesterol, Total 120 100 - 199 mg/dL   Triglycerides 395 (H) 0 - 149 mg/dL   HDL 30 (L) >39 mg/dL   VLDL Cholesterol Cal 79 (H) 5 - 40 mg/dL   LDL Calculated 11 0 - 99 mg/dL   Chol/HDL Ratio 4.0 0.0  - 5.0 ratio  TSH  Result Value Ref Range   TSH 2.320 0.450 - 4.500 uIU/mL  Urinalysis, Routine w reflex microscopic  Result Value Ref Range   Specific Gravity, UA 1.015 1.005 - 1.030   pH, UA 5.0 5.0 - 7.5   Color, UA Yellow Yellow   Appearance Ur Clear Clear   Leukocytes, UA Negative Negative   Protein, UA Negative Negative/Trace   Glucose, UA 3+ (A) Negative   Ketones, UA Negative Negative   RBC, UA Negative Negative   Bilirubin, UA Negative Negative   Urobilinogen, Ur 0.2 0.2 - 1.0 mg/dL   Nitrite, UA Negative Negative   Microscopic Examination See below:   Bayer DCA Hb A1c Waived  Result Value Ref Range   Bayer DCA Hb A1c Waived 7.8 (H) <7.0 %      Assessment & Plan:   Problem List Items Addressed This Visit      Cardiovascular and Mediastinum   Essential hypertension    The current medical regimen is effective;  continue present plan and medications.       Paroxysmal atrial fibrillation (HCC)    Discussed falling and risk especially with taking blood thinner.        Endocrine   DM type 2 (diabetes mellitus, type 2) ([place]) - Primary    The current medical regimen is effective;  continue present plan and medications.       Relevant Orders   Bayer DCA Hb A1c Waived     Other   History of back surgery    Concerned about hardware and fall. Will observe for now.          Follow up plan: Return in about 3 months (around 07/02/2017) for Hemoglobin A1c, BMP,  Lipids, ALT, AST.

## 2017-04-02 NOTE — Assessment & Plan Note (Signed)
Concerned about hardware and fall. Will observe for now.

## 2017-04-02 NOTE — Assessment & Plan Note (Signed)
Discussed falling and risk especially with taking blood thinner.

## 2017-04-02 NOTE — Assessment & Plan Note (Signed)
The current medical regimen is effective;  continue present plan and medications.  

## 2017-04-03 DIAGNOSIS — M545 Low back pain: Secondary | ICD-10-CM | POA: Diagnosis not present

## 2017-04-03 DIAGNOSIS — G8929 Other chronic pain: Secondary | ICD-10-CM | POA: Diagnosis not present

## 2017-04-03 DIAGNOSIS — R262 Difficulty in walking, not elsewhere classified: Secondary | ICD-10-CM | POA: Diagnosis not present

## 2017-04-03 DIAGNOSIS — G20 Parkinson's disease: Secondary | ICD-10-CM | POA: Diagnosis not present

## 2017-04-07 DIAGNOSIS — Z23 Encounter for immunization: Secondary | ICD-10-CM | POA: Diagnosis not present

## 2017-04-08 NOTE — Progress Notes (Signed)
Cardiology Office Note  Date:  04/09/2017   ID:  Timothy [name], DOB [birth year], MRN [id number]  PCP:  Timothy Maple, MD   Chief Complaint  Patient presents with  . other    6 month f/u no complaints today. Meds reviewed verbally with pt.    HPI:  [name] is a very pleasant 83 year old gentleman with a history of  Hyperlipidemia, hypertension,  diabetes,  with no known coronary artery disease,  3.5 cm abdominal aortic aneurysm seen on ultrasound in 2014  mild to moderate aortic valve stenosis. Stress echo in early 2016 at [place] fibrillation recently seen on even monitor, started on anticoagulation and flecainide bilateral popliteal artery aneurysms, B/l popliteal bypass in 12/2012 and 05/2013 for 1.7 cm aneurysm popliteal Back surgery 2016 US aorta 09/2012: 3.7 cm U/s 05/2015: LE arterial U/S, aorta 2.5 cm He presents today for follow-up of his high cholesterol, PAD, paroxysmal atrial fibrillation  Lost his wife in 2018, very stressful otherwise doing well, limited by Parkinsons Walks with a walker  Fall 02/16/2017 Hit head, concusion Hurt should arm, CT head, xray shoulder Shingles  09/21/2016 Flank pain 3.2 mm and 5 m stone  passout in [place], maybe passed 5 mm stone Records not available  Lab work reviewed with him HBA1C 8.2, trending upwards Desserts Total chol 120  BP at home 130s No orthostasis symptoms  EKG on today's visit shows normal sinus rhythm with rate 60 bpm, poor R-wave through the anterior precordial leads, left anterior fascicular block  Other past medical history  takes torsemide as needed for ankle edema  He is not doing much exercise, working with physical therapy   recent back surgery at [facility] in June 2016 with postoperative complications including ileus, atrial fibrillation, congestive heart failure/swelling. He spent several weeks at rehabilitation.  In follow-up, was noted to have elevated heart rate 150 bpm, sent to the  emergency room. Was found to have SVT. This broke on its own then developed atrial fibrillation. He was given digoxin IV push for heart rate control in the setting of low blood pressure. Then converted to normal sinus rhythm.   12/14/2012 he had popliteal aneurysm surgery at [facility]. Other leg was done 05/21/2013.   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  PSH:    Past Surgical History:  Procedure Laterality Date  . APPENDECTOMY    . BACK SURGERY    . CATARACT EXTRACTION    . COLONOSCOPY  [id number]  . ESOPHAGEAL DILATION    . GALLBLADDER SURGERY    . HERNIA REPAIR    . RETINAL DETACHMENT SURGERY    . TONSILLECTOMY      Current Outpatient Prescriptions  Medication Sig Dispense Refill  . acetaminophen (TYLENOL) 500 MG tablet Take 1,000 mg by mouth every 8 (eight) hours as needed.     Marland Kitchen apixaban (ELIQUIS) 5 MG TABS tablet Take 1 tablet (5 mg total) by mouth 2 (two) times daily. 60 tablet 6  .  augmented betamethasone dipropionate (DIPROLENE AF) 0.05 % cream Apply topically 2 (two) times daily. 30 g 0  . benazepril (LOTENSIN) 5 MG tablet Take 1 tablet (5 mg total) by mouth daily. 30 tablet 6  . Calcium Citrate-Vitamin D (CALCIUM + D PO) Take 1 tablet by mouth 2 (two) times daily.     . carbidopa-levodopa (SINEMET) 25-100 MG tablet Take 100 mg by mouth 4 (four) times  daily.     . cyanocobalamin (,VITAMIN B-12,) 1000 MCG/ML injection Inject 1 mL (1,000 mcg total) into the muscle every 30 (thirty) days. 1 mL 12  . dapagliflozin propanediol (FARXIGA) 10 MG TABS tablet Take 10 mg by mouth daily. 90 tablet 4  . digoxin (LANOXIN) 0.125 MG tablet TAKE ONE TABLET EVERY DAY 90 tablet 0  . diltiazem (CARDIZEM) 30 MG tablet Take 1 tablet (30 mg total) by mouth 3 (three) times daily as needed. 90 tablet 3  . flecainide (TAMBOCOR) 50 MG tablet TAKE 1 TABLET BY MOUTH TWICE DAILY 180 tablet 0  . glipiZIDE-metformin (METAGLIP) 5-500 MG tablet Take 1 tablet by mouth 2 (two) times daily. 180 tablet 1  . GLUCOSAMINE-CHONDROITIN-VIT D3 PO Take by mouth daily.    . hydrocortisone 2.5 % cream Apply 1 application topically as needed.     . metoprolol succinate (TOPROL-XL) 50 MG 24 hr tablet Take 1 tablet (50 mg total) by mouth daily. Take with or immediately following a meal. 30 tablet 6  . Multiple Vitamins-Minerals (CEROVITE SENIOR PO) Take 1 tablet by mouth daily.    . pantoprazole (PROTONIX) 40 MG tablet TAKE ONE TABLET TWICE DAILY 60 tablet 12  . Probiotic Product ([place]) Take by mouth.    . rosuvastatin (CRESTOR) 10 MG tablet Take 1 tablet (10 mg total) by mouth daily. 30 tablet 6  . sitaGLIPtin (JANUVIA) 100 MG tablet Take 1 tablet (100 mg total) by mouth daily. 90 tablet 1  . torsemide (DEMADEX) 20 MG tablet Take 20 mg by mouth daily.     No current facility-administered medications for this visit.      Allergies:   Patient has no known allergies.   Social History:  The patient  reports that he has never smoked. He has never used smokeless tobacco. He reports that he does not drink alcohol or use drugs.   Family History:   family history includes Diabetes in his unknown relative; Heart disease in his brother and mother; Stroke in his brother.    Review of Systems: Review of Systems  Constitutional: Negative.   Respiratory: Negative.    Cardiovascular: Negative.   Gastrointestinal: Negative.   Musculoskeletal:       Unsteady gait  Neurological: Negative.   Psychiatric/Behavioral: Negative.   All other systems reviewed and are negative.    PHYSICAL EXAM: VS:  BP 110/60 (BP Location: Left Arm, Patient Position: Sitting, Cuff Size: Normal)   Pulse 60   Ht 5' 9.5" (1.765 m)   Wt 178 lb 4 oz (80.9 kg)   BMI 25.95 kg/m  , BMI Body mass index is 25.95 kg/m. No change in exam on today's visit GEN: Well nourished, well developed, in no acute distress , presents with a cane HEENT: normal  Neck: no JVD, carotid bruits, or masses Cardiac: RRR; no murmurs, rubs, or gallops,no edema  Respiratory:  clear to auscultation bilaterally, normal work of breathing GI: soft, nontender, nondistended, + BS MS: no deformity or atrophy  Skin: warm and dry, no rash Neuro:  Strength and  sensation are intact Psych: euthymic mood, full affect    Recent Labs: 11/27/2016: ALT 11; BUN 21; Creatinine, Ser 1.40; Hemoglobin 11.9; Platelets 209; Potassium 4.4; Sodium 135; TSH 2.320    Lipid Panel Lab Results  Component Value Date   CHOL 120 11/27/2016   HDL 30 (L) 11/27/2016   LDLCALC 11 11/27/2016   TRIG 395 (H) 11/27/2016      Wt Readings from Last 3 Encounters:  04/09/17 178 lb 4 oz (80.9 kg)  04/02/17 180 lb (81.6 kg)  02/16/17 180 lb (81.6 kg)       ASSESSMENT AND PLAN:  Paroxysmal atrial fibrillation ([place]) - Plan: EKG 12-Lead Maintaining normal sinus rhythm Tolerating anticoagulation Continue current medications, stable  AAA (abdominal aortic aneurysm) without rupture ([place]) Mildly dilated aneurysm in 2014 Did not appear to be dilated on [facility] ultrasound 2016 Repeat ultrasound for popliteal bypass, possible AAA  Essential hypertension Blood pressure is well controlled on today's visit. No changes made to the medications.  Type 2 diabetes mellitus with other circulatory complication, without long-term current use  of insulin (HCC) Poor diet, hemoglobin A1c trending upwards Dietary changes discussed with him  Parkinson's disease ([place]) Long discussion concerning his recent diagnosis, Sinemet, need to exercise stable  Peripheral arterial disease ([place]) Bilateral popliteal aneurysm, had bilateral bypass surgery at [facility] Repeat ultrasound ordered  Aortic valve stenosis  Repeat echocardiogram ordered, prominent murmur on exam Borderline to mild aortic valve stenosis on echocardiogram 2014   Total encounter time more than 25 minutes  Greater than 50% was spent in counseling and coordination of care with the patient   Disposition:   F/U  6 months   Orders Placed This Encounter  Procedures  . EKG 12-Lead     Signed, Esmond Plants, M.D., Ph.D. 04/09/2017  [facility] [place], [place]

## 2017-04-09 ENCOUNTER — Ambulatory Visit (INDEPENDENT_AMBULATORY_CARE_PROVIDER_SITE_OTHER): Payer: Medicare Other | Admitting: Cardiovascular Disease

## 2017-04-09 ENCOUNTER — Encounter: Payer: Self-pay | Admitting: Cardiovascular Disease

## 2017-04-09 VITALS — BP 110/60 | HR 60 | Ht 69.5 in | Wt 178.2 lb

## 2017-04-09 DIAGNOSIS — I714 Abdominal aortic aneurysm, without rupture, unspecified: Secondary | ICD-10-CM

## 2017-04-09 DIAGNOSIS — R011 Cardiac murmur, unspecified: Secondary | ICD-10-CM

## 2017-04-09 DIAGNOSIS — E782 Mixed hyperlipidemia: Secondary | ICD-10-CM

## 2017-04-09 DIAGNOSIS — I739 Peripheral vascular disease, unspecified: Secondary | ICD-10-CM | POA: Diagnosis not present

## 2017-04-09 DIAGNOSIS — I10 Essential (primary) hypertension: Secondary | ICD-10-CM | POA: Diagnosis not present

## 2017-04-09 DIAGNOSIS — I480 Paroxysmal atrial fibrillation: Secondary | ICD-10-CM

## 2017-04-09 DIAGNOSIS — Z9889 Other specified postprocedural states: Secondary | ICD-10-CM

## 2017-04-09 DIAGNOSIS — I359 Nonrheumatic aortic valve disorder, unspecified: Secondary | ICD-10-CM | POA: Diagnosis not present

## 2017-04-09 DIAGNOSIS — E1159 Type 2 diabetes mellitus with other circulatory complications: Secondary | ICD-10-CM

## 2017-04-09 NOTE — Patient Instructions (Addendum)
Medication Instructions:   No medication changes made  Labwork:  No new labs needed  Testing/Procedures:  We will order an aorta and LE arterial u/s for hx of PAD, AAA, popliteal bypass  We will order an echocardiogram for aortic valve disease, murmur  Your physician has requested that you have an abdominal aorta duplex. During this test, an ultrasound is used to evaluate the aorta. Allow 30 minutes for this exam. Do not eat after midnight the day before and avoid carbonated beverages  Your physician has requested that you have a lower extremity arterial doppler- During this test, ultrasound is used to evaluate arterial blood flow in the legs. Allow approximately one hour for this exam.   Your physician has requested that you have an echocardiogram. Echocardiography is a painless test that uses sound waves to create images of your heart. It provides your doctor with information about the size and shape of your heart and how well your heart's chambers and valves are working. This procedure takes approximately one hour. There are no restrictions for this procedure.   Follow-Up: It was a pleasure seeing you in the office today. Please call us if you have new issues that need to be addressed before your next appt.  [phone]  Your physician wants you to follow-up in: 12 months.  You will receive a reminder letter in the mail two months in advance. If you don't receive a letter, please call our office to schedule the follow-up appointment.  If you need a refill on your cardiac medications before your next appointment, please call your pharmacy.    Echocardiogram An echocardiogram, or echocardiography, uses sound waves (ultrasound) to produce an image of your heart. The echocardiogram is simple, painless, obtained within a short period of time, and offers valuable information to your health care provider. The images from an echocardiogram can provide information such as:  Evidence  of coronary artery disease (CAD).  Heart size.  Heart muscle function.  Heart valve function.  Aneurysm detection.  Evidence of a past heart attack.  Fluid buildup around the heart.  Heart muscle thickening.  Assess heart valve function.  Tell a health care provider about:  Any allergies you have.  All medicines you are taking, including vitamins, herbs, eye drops, creams, and over-the-counter medicines.  Any problems you or family members have had with anesthetic medicines.  Any blood disorders you have.  Any surgeries you have had.  Any medical conditions you have.  Whether you are pregnant or may be pregnant. What happens before the procedure? No special preparation is needed. Eat and drink normally. What happens during the procedure?  In order to produce an image of your heart, gel will be applied to your chest and a wand-like tool (transducer) will be moved over your chest. The gel will help transmit the sound waves from the transducer. The sound waves will harmlessly bounce off your heart to allow the heart images to be captured in real-time motion. These images will then be recorded.  You may need an IV to receive a medicine that improves the quality of the pictures. What happens after the procedure? You may return to your normal schedule including diet, activities, and medicines, unless your health care provider tells you otherwise. This information is not intended to replace advice given to you by your health care provider. Make sure you discuss any questions you have with your health care provider. Document Released: 06/20/2000 Document Revised: 02/09/2016 Document Reviewed: 02/28/2013 Elsevier Interactive Patient Education  2017 [place].

## 2017-04-10 ENCOUNTER — Other Ambulatory Visit: Payer: Self-pay | Admitting: Cardiovascular Disease

## 2017-04-10 DIAGNOSIS — I739 Peripheral vascular disease, unspecified: Secondary | ICD-10-CM

## 2017-04-10 DIAGNOSIS — I714 Abdominal aortic aneurysm, without rupture, unspecified: Secondary | ICD-10-CM

## 2017-04-10 DIAGNOSIS — Z9889 Other specified postprocedural states: Secondary | ICD-10-CM

## 2017-04-24 ENCOUNTER — Ambulatory Visit (INDEPENDENT_AMBULATORY_CARE_PROVIDER_SITE_OTHER): Payer: Medicare Other

## 2017-04-24 ENCOUNTER — Other Ambulatory Visit: Payer: Self-pay

## 2017-04-24 DIAGNOSIS — I714 Abdominal aortic aneurysm, without rupture, unspecified: Secondary | ICD-10-CM

## 2017-04-24 DIAGNOSIS — I10 Essential (primary) hypertension: Secondary | ICD-10-CM | POA: Diagnosis not present

## 2017-04-24 DIAGNOSIS — I739 Peripheral vascular disease, unspecified: Secondary | ICD-10-CM

## 2017-04-24 DIAGNOSIS — R011 Cardiac murmur, unspecified: Secondary | ICD-10-CM

## 2017-04-24 DIAGNOSIS — I480 Paroxysmal atrial fibrillation: Secondary | ICD-10-CM

## 2017-04-24 DIAGNOSIS — Z9889 Other specified postprocedural states: Secondary | ICD-10-CM | POA: Diagnosis not present

## 2017-04-24 DIAGNOSIS — I359 Nonrheumatic aortic valve disorder, unspecified: Secondary | ICD-10-CM | POA: Diagnosis not present

## 2017-04-27 LAB — VAS US LOWER EXTREMITY ARTERIAL DUPLEX
LEFT SFA PROX DYS VEL: -2 cm/s
Left super femoral dist sys PSV: -44 cm/s
Left super femoral mid sys PSV: -36 cm/s
Left super femoral prox sys PSV: -40 cm/s
RIGHT SUPER FEMORAL MID EDV: 0 cm/s
RIGHT SUPER FEMORAL PROX EDV: 3 cm/s
Right super femoral dist sys PSV: -14 cm/s
Right super femoral mid sys PSV: -72 cm/s
Right super femoral prox sys PSV: 109 cm/s

## 2017-04-28 ENCOUNTER — Other Ambulatory Visit: Payer: Self-pay | Admitting: *Deleted

## 2017-04-28 DIAGNOSIS — I714 Abdominal aortic aneurysm, without rupture, unspecified: Secondary | ICD-10-CM

## 2017-04-28 DIAGNOSIS — I480 Paroxysmal atrial fibrillation: Secondary | ICD-10-CM

## 2017-05-02 ENCOUNTER — Other Ambulatory Visit: Payer: Self-pay | Admitting: Cardiovascular Disease

## 2017-05-06 ENCOUNTER — Telehealth: Payer: Self-pay | Admitting: Cardiovascular Disease

## 2017-05-06 MED ORDER — DILTIAZEM HCL 30 MG PO TABS: 30.0000 mg | ORAL_TABLET | Freq: Three times a day (TID) | ORAL | 3 refills | Status: DC | PRN

## 2017-05-06 NOTE — Telephone Encounter (Signed)
Patient daughter calling to check on need to continue taking cardizem   Patient is now out of refills but has needed to take more frequently lately b/c of stress or anxiety induced HTN (>150)   Patient daughter also wants to know if an echo would be helpful to determine blockage

## 2017-05-06 NOTE — Telephone Encounter (Signed)
Spoke with patients daughter per release form. She did want to review if the echo would show potential blockages. Discussed echocardiogram and stress testing and she just wants to make sure that nothing is missed. She reports that he has problems with anxiety and had some reports of pain during a stressful change at home. She states that she is monitoring his situation more closely and will certainly let us know if he should have any further reports of chest pain. She called in to request refill of diltiazem for as needed due to previous one being expired. Sent that over to pharmacy of choice and advised her to please keep [place] updated with any new symptoms or concerns. She was appreciative for the call and had no further questions.

## 2017-05-07 NOTE — Telephone Encounter (Signed)
We have recent echocardiogram, do not need to repeat He does not have recent stress test for many years Stress test can be ordered for chest pain or worsening shortness of breath symptoms Would need Lexiscan

## 2017-05-08 NOTE — Telephone Encounter (Signed)
Spoke with patients daughter per release form and reviewed Dr. Donivan Scull recommendations. She reports that she will discuss these options with the patient and would be in touch if they would like to proceed with scheduling this stress test. She states that she is trying to monitor him more closely for symptoms and will let us know if there are any further concerns. Instructed her to please give [place] a call back if I can assist with getting this set up. She was appreciative for the call back with no further questions.

## 2017-06-02 ENCOUNTER — Other Ambulatory Visit: Payer: Self-pay

## 2017-06-02 DIAGNOSIS — E785 Hyperlipidemia, unspecified: Secondary | ICD-10-CM

## 2017-06-02 MED ORDER — ROSUVASTATIN CALCIUM 10 MG PO TABS: 10.0000 mg | ORAL_TABLET | Freq: Every day | ORAL | 6 refills | Status: DC

## 2017-06-18 ENCOUNTER — Other Ambulatory Visit: Payer: Self-pay | Admitting: Cardiovascular Disease

## 2017-06-18 ENCOUNTER — Other Ambulatory Visit: Payer: Self-pay | Admitting: Family Medicine

## 2017-06-18 DIAGNOSIS — E1159 Type 2 diabetes mellitus with other circulatory complications: Secondary | ICD-10-CM

## 2017-06-18 DIAGNOSIS — D099 Carcinoma in situ, unspecified: Secondary | ICD-10-CM | POA: Diagnosis not present

## 2017-06-22 ENCOUNTER — Other Ambulatory Visit: Payer: Self-pay | Admitting: Family Medicine

## 2017-06-22 ENCOUNTER — Other Ambulatory Visit: Payer: Self-pay | Admitting: Cardiovascular Disease

## 2017-07-03 DIAGNOSIS — H35372 Puckering of macula, left eye: Secondary | ICD-10-CM | POA: Diagnosis not present

## 2017-07-03 DIAGNOSIS — E119 Type 2 diabetes mellitus without complications: Secondary | ICD-10-CM | POA: Diagnosis not present

## 2017-07-03 LAB — HM DIABETES EYE EXAM

## 2017-07-13 ENCOUNTER — Other Ambulatory Visit: Payer: Self-pay | Admitting: Family Medicine

## 2017-07-13 NOTE — Telephone Encounter (Signed)
Your patient 

## 2017-07-30 ENCOUNTER — Other Ambulatory Visit: Payer: Self-pay | Admitting: Cardiovascular Disease

## 2017-07-30 ENCOUNTER — Ambulatory Visit: Payer: Medicare Other | Admitting: Family Medicine

## 2017-08-03 ENCOUNTER — Ambulatory Visit: Payer: Medicare Other | Admitting: Family Medicine

## 2017-08-19 ENCOUNTER — Encounter: Payer: Self-pay | Admitting: Family Medicine

## 2017-08-19 ENCOUNTER — Telehealth: Payer: Self-pay

## 2017-08-19 ENCOUNTER — Ambulatory Visit (INDEPENDENT_AMBULATORY_CARE_PROVIDER_SITE_OTHER): Payer: Medicare Other | Admitting: Family Medicine

## 2017-08-19 VITALS — BP 132/69 | HR 70 | Wt 187.0 lb

## 2017-08-19 DIAGNOSIS — I480 Paroxysmal atrial fibrillation: Secondary | ICD-10-CM | POA: Diagnosis not present

## 2017-08-19 DIAGNOSIS — I10 Essential (primary) hypertension: Secondary | ICD-10-CM | POA: Diagnosis not present

## 2017-08-19 DIAGNOSIS — E782 Mixed hyperlipidemia: Secondary | ICD-10-CM | POA: Diagnosis not present

## 2017-08-19 DIAGNOSIS — E1159 Type 2 diabetes mellitus with other circulatory complications: Secondary | ICD-10-CM | POA: Diagnosis not present

## 2017-08-19 DIAGNOSIS — I714 Abdominal aortic aneurysm, without rupture, unspecified: Secondary | ICD-10-CM

## 2017-08-19 DIAGNOSIS — Z23 Encounter for immunization: Secondary | ICD-10-CM

## 2017-08-19 MED ORDER — APIXABAN 5 MG PO TABS: 5.0000 mg | ORAL_TABLET | Freq: Two times a day (BID) | ORAL | 6 refills | Status: DC

## 2017-08-19 NOTE — Assessment & Plan Note (Signed)
Stable on chart review

## 2017-08-19 NOTE — Assessment & Plan Note (Signed)
Reviewed blood thinner risk.  Patient will also assess risk of blood thinner and falling risk with cardiology appointment coming up in April.

## 2017-08-19 NOTE — Assessment & Plan Note (Signed)
The current medical regimen is effective;  continue present plan and medications.  

## 2017-08-19 NOTE — Progress Notes (Signed)
BP 132/69   Pulse 70   Wt 187 lb (84.8 kg)   SpO2 97%   BMI 27.22 kg/m    Subjective:    Patient ID: Timothy [name], male    DOB: 09-04-1928, 84 y.o.   MRN: [id number]  HPI: Timothy [name] is a 84 y.o. male  Chief Complaint  Patient presents with  . Follow-up  . Diabetes  . Hypertension   Patient accompanied by his daughter who assists with history. Patient all in all doing well no complaints from medications taking medications faithfully.  Some confusion as looks like some medicine should have run out but still taking faithfully and has not missed. Reviewed falling risk patient has not fallen but has Parkinson's and other conditions which increase falling risk.  Patient also taking Eliquis. Reviewed cardiology notes. Diabetes no low blood sugar spells no issues with medications.  Relevant past medical, surgical, family and social history reviewed and updated as indicated. Interim medical history since our last visit reviewed. Allergies and medications reviewed and updated.  Review of Systems  Constitutional: Negative.   Respiratory: Negative.   Cardiovascular: Negative.     Per HPI unless specifically indicated above     Objective:    BP 132/69   Pulse 70   Wt 187 lb (84.8 kg)   SpO2 97%   BMI 27.22 kg/m   Wt Readings from Last 3 Encounters:  08/19/17 187 lb (84.8 kg)  04/09/17 178 lb 4 oz (80.9 kg)  04/02/17 180 lb (81.6 kg)    Physical Exam  Constitutional: He is oriented to person, place, and time. He appears well-developed and well-nourished.  HENT:  Head: Normocephalic and atraumatic.  Eyes: Conjunctivae and EOM are normal.  Neck: Normal range of motion.  Cardiovascular: Normal rate, regular rhythm and normal heart sounds.  Pulmonary/Chest: Effort normal and breath sounds normal.  Musculoskeletal: Normal range of motion.  Neurological: He is alert and oriented to person, place, and time.  Skin: No erythema.  Psychiatric: He has a normal mood and  affect. His behavior is normal. Judgment and thought content normal.    Results for orders placed or performed in visit on 08/11/17  HM DIABETES EYE EXAM  Result Value Ref Range   HM Diabetic Eye Exam No Retinopathy No Retinopathy      Assessment & Plan:   Problem List Items Addressed This Visit      Cardiovascular and Mediastinum   Essential hypertension - Primary   Relevant Medications   apixaban (ELIQUIS) 5 MG TABS tablet   Other Relevant Orders   Bayer DCA Hb A1c Waived   Basic metabolic panel   LP+ALT+AST Piccolo, Waived   AAA (abdominal aortic aneurysm) without rupture (HCC)    Stable on chart review      Relevant Medications   apixaban (ELIQUIS) 5 MG TABS tablet   Paroxysmal atrial fibrillation ([place])    Reviewed blood thinner risk.  Patient will also assess risk of blood thinner and falling risk with cardiology appointment coming up in April.      Relevant Medications   apixaban (ELIQUIS) 5 MG TABS tablet     Endocrine   DM type 2 (diabetes mellitus, type 2) ([place])    The current medical regimen is effective;  continue present plan and medications.       Relevant Orders   Bayer DCA Hb A1c Waived   Basic metabolic panel   LP+ALT+AST Piccolo, Waived     Other   HLD (  hyperlipidemia)   Relevant Medications   apixaban (ELIQUIS) 5 MG TABS tablet   Other Relevant Orders   Bayer DCA Hb A1c Waived   Basic metabolic panel   LP+ALT+AST Piccolo, Waived    Other Visit Diagnoses    Need for pneumococcal vaccination       Relevant Orders   Pneumococcal polysaccharide vaccine 23-valent greater than or equal to 2yo subcutaneous/IM (Completed)       Follow up plan: Return in about 6 months (around 02/16/2018) for Physical Exam, Hemoglobin A1c.

## 2017-08-19 NOTE — Telephone Encounter (Signed)
Patient's daughter requesting refill on cream "for shingles" Please advise.

## 2017-08-20 ENCOUNTER — Encounter: Payer: Self-pay | Admitting: Family Medicine

## 2017-08-20 LAB — BASIC METABOLIC PANEL
BUN/Creatinine Ratio: 13 (ref 10–24)
BUN: 16 mg/dL (ref 8–27)
CO2: 19 mmol/L — ABNORMAL LOW (ref 20–29)
Calcium: 9.1 mg/dL (ref 8.6–10.2)
Chloride: 101 mmol/L (ref 96–106)
Creatinine, Ser: 1.27 mg/dL (ref 0.76–1.27)
GFR calc Af Amer: 59 mL/min/{1.73_m2} — ABNORMAL LOW (ref >59)
GFR calc non Af Amer: 51 mL/min/{1.73_m2} — ABNORMAL LOW (ref >59)
Glucose: 226 mg/dL — ABNORMAL HIGH (ref 65–99)
Potassium: 4.7 mmol/L (ref 3.5–5.2)
Sodium: 135 mmol/L (ref 134–144)

## 2017-08-20 LAB — LP+ALT+AST PICCOLO, WAIVED
ALT (SGPT) Piccolo, Waived: 21 U/L (ref 10–47)
AST (SGOT) Piccolo, Waived: 31 U/L (ref 11–38)
Cholesterol Piccolo, Waived: 127 mg/dL (ref <200)
Triglycerides Piccolo,Waived: 401 mg/dL — ABNORMAL HIGH (ref <150)

## 2017-08-20 LAB — BAYER DCA HB A1C WAIVED: HB A1C (BAYER DCA - WAIVED): 8.2 % — ABNORMAL HIGH (ref <7.0)

## 2017-09-17 ENCOUNTER — Other Ambulatory Visit: Payer: Self-pay | Admitting: Family Medicine

## 2017-09-17 DIAGNOSIS — I10 Essential (primary) hypertension: Secondary | ICD-10-CM

## 2017-09-17 MED ORDER — BENAZEPRIL HCL 5 MG PO TABS: 5.0000 mg | ORAL_TABLET | Freq: Every day | ORAL | 1 refills | Status: DC

## 2017-09-17 NOTE — Telephone Encounter (Signed)
[facility]  Benazepril 90- day supply [facility]  Fax [phone]  Thanks

## 2017-09-17 NOTE — Telephone Encounter (Signed)
Routing to provider. Patient last seen 08/19/17 and has f/up 12/03/17.

## 2017-09-29 ENCOUNTER — Other Ambulatory Visit: Payer: Self-pay

## 2017-09-29 ENCOUNTER — Encounter: Payer: Self-pay | Admitting: Emergency Medicine

## 2017-09-29 ENCOUNTER — Emergency Department
Admission: EM | Admit: 2017-09-29 | Discharge: 2017-09-29 | Disposition: A | Discharge status: Home / Self Care | Payer: Medicare Other | Attending: Emergency Medicine | Admitting: Emergency Medicine

## 2017-09-29 DIAGNOSIS — I509 Heart failure, unspecified: Secondary | ICD-10-CM | POA: Insufficient documentation

## 2017-09-29 DIAGNOSIS — Z79899 Other long term (current) drug therapy: Secondary | ICD-10-CM | POA: Diagnosis not present

## 2017-09-29 DIAGNOSIS — Z85828 Personal history of other malignant neoplasm of skin: Secondary | ICD-10-CM | POA: Insufficient documentation

## 2017-09-29 DIAGNOSIS — Z7901 Long term (current) use of anticoagulants: Secondary | ICD-10-CM | POA: Insufficient documentation

## 2017-09-29 DIAGNOSIS — E119 Type 2 diabetes mellitus without complications: Secondary | ICD-10-CM | POA: Insufficient documentation

## 2017-09-29 DIAGNOSIS — R55 Syncope and collapse: Secondary | ICD-10-CM | POA: Diagnosis not present

## 2017-09-29 DIAGNOSIS — Z8673 Personal history of transient ischemic attack (TIA), and cerebral infarction without residual deficits: Secondary | ICD-10-CM | POA: Insufficient documentation

## 2017-09-29 DIAGNOSIS — Z7984 Long term (current) use of oral hypoglycemic drugs: Secondary | ICD-10-CM | POA: Diagnosis not present

## 2017-09-29 DIAGNOSIS — I110 Hypertensive heart disease with heart failure: Secondary | ICD-10-CM | POA: Insufficient documentation

## 2017-09-29 LAB — URINALYSIS, COMPLETE (UACMP) WITH MICROSCOPIC
Bacteria, UA: NONE SEEN
Bilirubin Urine: NEGATIVE
Glucose, UA: >=500 mg/dL — AB
Hgb urine dipstick: NEGATIVE
Ketones, ur: NEGATIVE mg/dL
Leukocytes, UA: NEGATIVE
Nitrite: NEGATIVE
Protein, ur: NEGATIVE mg/dL
Specific Gravity, Urine: 1.026 (ref 1.005–1.030)
pH: 5 (ref 5.0–8.0)

## 2017-09-29 LAB — COMPREHENSIVE METABOLIC PANEL
ALT: 7 U/L — ABNORMAL LOW (ref 17–63)
AST: 32 U/L (ref 15–41)
Albumin: 4.2 g/dL (ref 3.5–5.0)
Alkaline Phosphatase: 39 U/L (ref 38–126)
Anion gap: 11 (ref 5–15)
BUN: 18 mg/dL (ref 6–20)
CO2: 21 mmol/L — ABNORMAL LOW (ref 22–32)
Calcium: 8.9 mg/dL (ref 8.9–10.3)
Chloride: 104 mmol/L (ref 101–111)
Creatinine, Ser: 1.29 mg/dL — ABNORMAL HIGH (ref 0.61–1.24)
GFR calc Af Amer: 56 mL/min — ABNORMAL LOW (ref >60)
GFR calc non Af Amer: 48 mL/min — ABNORMAL LOW (ref >60)
Glucose, Bld: 211 mg/dL — ABNORMAL HIGH (ref 65–99)
Potassium: 4.3 mmol/L (ref 3.5–5.1)
Sodium: 136 mmol/L (ref 135–145)
Total Bilirubin: 0.7 mg/dL (ref 0.3–1.2)
Total Protein: 7.1 g/dL (ref 6.5–8.1)

## 2017-09-29 LAB — CBC
HCT: 36.4 % — ABNORMAL LOW (ref 40.0–52.0)
Hemoglobin: 11.9 g/dL — ABNORMAL LOW (ref 13.0–18.0)
MCH: 30.5 pg (ref 26.0–34.0)
MCHC: 32.6 g/dL (ref 32.0–36.0)
MCV: 93.7 fL (ref 80.0–100.0)
Platelets: 197 10*3/uL (ref 150–440)
RBC: 3.89 MIL/uL — ABNORMAL LOW (ref 4.40–5.90)
RDW: 14.5 % (ref 11.5–14.5)
WBC: 6.7 10*3/uL (ref 3.8–10.6)

## 2017-09-29 LAB — TROPONIN I: Troponin I: <0.03 ng/mL (ref <0.03)

## 2017-09-29 NOTE — ED Notes (Signed)
Patient was able to ambulate with assistance to restroom.  Denies dizziness or lightheadedness at any point.

## 2017-09-29 NOTE — ED Triage Notes (Signed)
Pt to ED via EMS from home c/o syncopal episode today while having BM.  States has happened in the past, today feels tired after episode. Hx of a.fib and parkinson's.  Presents A&Ox4, denies pain, respirations even and unlabored.

## 2017-09-29 NOTE — ED Notes (Signed)
famiy signed esignature.

## 2017-09-29 NOTE — ED Provider Notes (Signed)
[facility] Emergency Department Provider Note       Time seen: ----------------------------------------- 12:34 PM on 09/29/2017 -----------------------------------------   I have reviewed the triage vital signs and the nursing notes.  HISTORY   Chief Complaint Loss of Consciousness    HPI Timothy [name] is a 84 y.o. male with a history of CHF, TIA, AAA, hypertension, hyperlipidemia who presents to the ED for syncope.  Patient presents to the ER from his home.  His caregiver reported that he syncopized after a bowel movement.  He has had this happen at least once in the distant past.  He denies fevers, chills, chest pain, shortness of breath, vomiting or diarrhea.  His only current complaints are weakness.  Past Medical History:  Diagnosis Date  . Back pain   . CHF (congestive heart failure) ([place])   . Esophageal reflux   . 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 Senile osteoporosis   . Skin cancer   . TIA (transient ischemic attack)     Patient Active Problem List   Diagnosis Date Noted  . Advanced care planning/counseling discussion 11/27/2016  . Pernicious anemia 05/28/2015  . Leg weakness, bilateral 04/12/2015  . Tremor 03/27/2015  . Chronic anxiety 02/20/2015  . SVT (supraventricular tachycardia) ([place]) 02/16/2015  . Paroxysmal atrial fibrillation ([place]) 02/16/2015  . History of back surgery 02/16/2015  . AAA (abdominal aortic aneurysm) without rupture ([place]) 10/07/2012  . TIA (transient ischemic attack) 12/29/2011  . HLD (hyperlipidemia) 09/24/2011  . Essential hypertension 09/24/2011  . DM type 2 (diabetes mellitus, type 2) ([place]) 09/24/2011    Past Surgical History:  Procedure Laterality Date  . APPENDECTOMY    . BACK SURGERY    . CATARACT EXTRACTION    . COLONOSCOPY  [id number]  . ESOPHAGEAL DILATION    . GALLBLADDER SURGERY    . HERNIA REPAIR    . RETINAL DETACHMENT SURGERY    . TONSILLECTOMY       Allergies Patient has no known allergies.  Social History Social History   Tobacco Use  . Smoking status: Never Smoker  . Smokeless tobacco: Never Used  Substance Use Topics  . Alcohol use: No  . Drug use: No   Review of Systems Constitutional: Negative for fever. Cardiovascular: Negative for chest pain. Respiratory: Negative for shortness of breath. Gastrointestinal: Negative for abdominal pain, vomiting and diarrhea. Musculoskeletal: Negative for back pain. Skin: Negative for rash. Neurological: Negative for headaches, positive for generalized weakness  All systems negative/normal/unremarkable except as stated in the HPI  ____________________________________________   PHYSICAL EXAM:  VITAL SIGNS: ED Triage Vitals  Enc Vitals Group     BP      Pulse      Resp      Temp      Temp src      SpO2      Weight      Height      Head Circumference      Peak Flow      Pain Score      Pain Loc      Pain Edu?      Excl. in [place]?    Constitutional: Alert and oriented. Well appearing and in no distress. Eyes: Conjunctivae are normal. Normal extraocular movements. ENT   Head: Normocephalic and atraumatic.   Nose: No congestion/rhinnorhea.   Mouth/Throat: Mucous membranes are moist.   Neck: No stridor. Cardiovascular: Normal rate, regular rhythm.  Systolic murmur  is noted Respiratory: Normal respiratory effort without tachypnea nor retractions. Breath sounds are clear and equal bilaterally. No wheezes/rales/rhonchi. Gastrointestinal: Soft and nontender. Normal bowel sounds Musculoskeletal: Nontender with normal range of motion in extremities. No lower extremity tenderness nor edema. Neurologic:  Normal speech and language. No gross focal neurologic deficits are appreciated.  Skin:  Skin is warm, dry and intact. No rash noted. Psychiatric: Mood and affect are normal. Speech and behavior are normal.  ____________________________________________  EKG:  Interpreted by me.  Sinus rhythm with first-degree AV block, rate is 60 bpm, wide QRS, right bundle branch block  ____________________________________________  ED COURSE:  As part of my medical decision making, I reviewed the following data within the [facility] History obtained from family if available, nursing notes, old chart and ekg, as well as notes from prior ED visits. Patient presented for syncope, we will assess with labs and imaging as indicated at this time.   Procedures ____________________________________________   LABS (pertinent positives/negatives)  Labs Reviewed  CBC - Abnormal; Notable for the following components:      Result Value   RBC 3.89 (*)    Hemoglobin 11.9 (*)    HCT 36.4 (*)    All other components within normal limits  COMPREHENSIVE METABOLIC PANEL - Abnormal; Notable for the following components:   CO2 21 (*)    Glucose, Bld 211 (*)    Creatinine, Ser 1.29 (*)    ALT 7 (*)    GFR calc non Af Amer 48 (*)    GFR calc Af Amer 56 (*)    All other components within normal limits  TROPONIN I   ____________________________________________  DIFFERENTIAL DIAGNOSIS   Vasovagal syncope, arrhythmia, dehydration, electrolyte abnormality, occult infection  FINAL ASSESSMENT AND PLAN  Syncope   Plan: The patient had presented for syncope. Patient's labs do not reveal any acute process.  This appeared to be vasovagal syncope, he was not orthostatic here.  This occurred while he was having a bowel movement.  He is cleared for outpatient follow-up with his doctor.  Laurence Aly, MD   Note: This note was generated in part or whole with voice recognition software. Voice recognition is usually quite accurate but there are transcription errors that can and very often do occur. I apologize for any typographical errors that were not detected and corrected.     Earleen Newport, MD 09/29/17 [phone]

## 2017-09-30 LAB — URINE CULTURE
Culture: 30000 — AB
Special Requests: NORMAL

## 2017-10-02 DIAGNOSIS — E1151 Type 2 diabetes mellitus with diabetic peripheral angiopathy without gangrene: Secondary | ICD-10-CM | POA: Diagnosis not present

## 2017-10-02 DIAGNOSIS — B351 Tinea unguium: Secondary | ICD-10-CM | POA: Diagnosis not present

## 2017-10-02 DIAGNOSIS — L851 Acquired keratosis [keratoderma] palmaris et plantaris: Secondary | ICD-10-CM | POA: Diagnosis not present

## 2017-10-02 DIAGNOSIS — M79671 Pain in right foot: Secondary | ICD-10-CM | POA: Diagnosis not present

## 2017-10-02 DIAGNOSIS — M79672 Pain in left foot: Secondary | ICD-10-CM | POA: Diagnosis not present

## 2017-10-06 ENCOUNTER — Other Ambulatory Visit: Payer: Self-pay

## 2017-10-06 DIAGNOSIS — E1159 Type 2 diabetes mellitus with other circulatory complications: Secondary | ICD-10-CM

## 2017-10-06 MED ORDER — GLIPIZIDE-METFORMIN HCL 5-500 MG PO TABS: 1.0000 | ORAL_TABLET | Freq: Two times a day (BID) | ORAL | 1 refills | Status: DC

## 2017-10-06 MED ORDER — SITAGLIPTIN PHOSPHATE 100 MG PO TABS: 100.0000 mg | ORAL_TABLET | Freq: Every day | ORAL | 1 refills | Status: DC

## 2017-10-16 ENCOUNTER — Other Ambulatory Visit: Payer: Self-pay

## 2017-10-16 DIAGNOSIS — I10 Essential (primary) hypertension: Secondary | ICD-10-CM

## 2017-10-16 MED ORDER — BENAZEPRIL HCL 5 MG PO TABS: 5.0000 mg | ORAL_TABLET | Freq: Every day | ORAL | 0 refills | Status: DC

## 2017-10-16 NOTE — Telephone Encounter (Signed)
90 Day Supply Request for Benazepril.

## 2017-10-29 ENCOUNTER — Other Ambulatory Visit: Payer: Self-pay | Admitting: Family Medicine

## 2017-11-06 ENCOUNTER — Telehealth: Payer: Self-pay | Admitting: Cardiovascular Disease

## 2017-11-06 NOTE — Telephone Encounter (Signed)
Pt c/o Shortness Of Breath: STAT if SOB developed within the last 24 hours or pt is noticeably SOB on the phone  1. Are you currently SOB (can you hear that pt is SOB on the phone)?  Unknown   2. How long have you been experiencing SOB?  Interim that daughter has noticed a few times   3. Are you SOB when sitting or when up moving around? Exertion   4. Are you currently experiencing any other symptoms?   Fatigue and puffiness in eyes

## 2017-11-06 NOTE — Telephone Encounter (Signed)
Spoke with pt's daughter. She states her dad has been more SOB than usual, needing to take a rest with a long walk across a parking lot yesterday. Today his vitals are at 146/74 with HR of 58. Pt's daughter states he does not take daily weights. She states she is worried he may have pneumonia. I advised her to go to urgent care for evaluation and CXR if this is a concern.   She also states he does not usually take his torsemide because "he doesn't needed." Currently he is prescribed to take 20mg  qd. I advised her to have him begin back on his Torsemide as prescribed. She should give him a few days to diurese and call back the office on Monday if he has not shown any improvement. Pt's daughter agreed to plan and had no additional questions.

## 2017-11-11 NOTE — Telephone Encounter (Signed)
No answer/Voicemail box is full.  

## 2017-12-03 ENCOUNTER — Ambulatory Visit: Payer: Medicare Other | Admitting: Family Medicine

## 2017-12-04 ENCOUNTER — Other Ambulatory Visit: Payer: Self-pay | Admitting: Cardiovascular Disease

## 2017-12-04 ENCOUNTER — Other Ambulatory Visit: Payer: Self-pay | Admitting: Family Medicine

## 2017-12-04 DIAGNOSIS — E1159 Type 2 diabetes mellitus with other circulatory complications: Secondary | ICD-10-CM

## 2017-12-10 ENCOUNTER — Ambulatory Visit (INDEPENDENT_AMBULATORY_CARE_PROVIDER_SITE_OTHER): Payer: Medicare Other | Admitting: Family Medicine

## 2017-12-10 VITALS — BP 121/63 | HR 73

## 2017-12-10 DIAGNOSIS — F419 Anxiety disorder, unspecified: Secondary | ICD-10-CM

## 2017-12-10 DIAGNOSIS — E1100 Type 2 diabetes mellitus with hyperosmolarity without nonketotic hyperglycemic-hyperosmolar coma (NKHHC): Secondary | ICD-10-CM | POA: Diagnosis not present

## 2017-12-10 DIAGNOSIS — I10 Essential (primary) hypertension: Secondary | ICD-10-CM | POA: Diagnosis not present

## 2017-12-10 DIAGNOSIS — G459 Transient cerebral ischemic attack, unspecified: Secondary | ICD-10-CM | POA: Diagnosis not present

## 2017-12-10 DIAGNOSIS — Z7189 Other specified counseling: Secondary | ICD-10-CM | POA: Diagnosis not present

## 2017-12-10 DIAGNOSIS — I714 Abdominal aortic aneurysm, without rupture, unspecified: Secondary | ICD-10-CM

## 2017-12-10 DIAGNOSIS — Z1329 Encounter for screening for other suspected endocrine disorder: Secondary | ICD-10-CM | POA: Diagnosis not present

## 2017-12-10 DIAGNOSIS — Z125 Encounter for screening for malignant neoplasm of prostate: Secondary | ICD-10-CM

## 2017-12-10 DIAGNOSIS — E785 Hyperlipidemia, unspecified: Secondary | ICD-10-CM | POA: Diagnosis not present

## 2017-12-10 DIAGNOSIS — E1159 Type 2 diabetes mellitus with other circulatory complications: Secondary | ICD-10-CM | POA: Diagnosis not present

## 2017-12-10 DIAGNOSIS — R1310 Dysphagia, unspecified: Secondary | ICD-10-CM | POA: Diagnosis not present

## 2017-12-10 NOTE — Assessment & Plan Note (Signed)
The current medical regimen is effective;  continue present plan and medications.  

## 2017-12-10 NOTE — Assessment & Plan Note (Signed)
Appointment with GI to further evaluate we will continue Protonix

## 2017-12-10 NOTE — Assessment & Plan Note (Addendum)
A voluntary discussion about advanced care planning including explanation and discussion of advanced directives was extentively discussed with the patient.  Explained about the healthcare proxy and living will was reviewed and packet with forms with expiration of how to fill them out was given.  Time spent: Encounter 16+ min individuals present: Patient and daughter. 

## 2017-12-10 NOTE — Assessment & Plan Note (Signed)
stable °

## 2017-12-10 NOTE — Progress Notes (Signed)
BP 121/63   Pulse 73   SpO2 98%    Subjective:    Patient ID: Timothy [name], male    DOB: 01/11/1929, 84 y.o.   MRN: [id number]  HPI: Timothy [name] is a 84 y.o. male  Chief Complaint  Patient presents with  . Annual Exam  . Shoulder Injury    Left  . Cough    Dry   Patient with complaints of left shoulder rashes tried some other creams not really sure may be betamethasone still a little bit of redness and itching wants something else.  We will go ahead and try triamcinolone cream Patient accompanied by his daughter who assists with history. Diabetes doing well no complaints.  No low blood sugar spells. Patient doing okay reports from Dr. Rockey Situ and his medications. Patient with worsening dysphagia food getting stuck in his throat has had this problem previously and wants to go back to GI. Marland Kitchen  Relevant past medical, surgical, family and social history reviewed and updated as indicated. Interim medical history since our last visit reviewed. Allergies and medications reviewed and updated.  Review of Systems  Constitutional: Negative.   HENT: Negative.   Eyes: Negative.   Respiratory: Negative.   Cardiovascular: Negative.   Gastrointestinal: Negative.   Endocrine: Negative.   Genitourinary: Negative.   Musculoskeletal: Negative.   Skin: Negative.   Allergic/Immunologic: Negative.   Neurological: Negative.   Hematological: Negative.   Psychiatric/Behavioral: Negative.     Per HPI unless specifically indicated above     Objective:    BP 121/63   Pulse 73   SpO2 98%   Wt Readings from Last 3 Encounters:  09/29/17 175 lb (79.4 kg)  08/19/17 187 lb (84.8 kg)  04/09/17 178 lb 4 oz (80.9 kg)    Physical Exam  Constitutional: He is oriented to person, place, and time. He appears well-developed and well-nourished.  HENT:  Head: Normocephalic and atraumatic.  Right Ear: External ear normal.  Left Ear: External ear normal.  Eyes: Pupils are equal, round, and reactive  to light. Conjunctivae and EOM are normal.  Neck: Normal range of motion. Neck supple.  Cardiovascular: Normal rate, regular rhythm, normal heart sounds and intact distal pulses.  Pulmonary/Chest: Effort normal and breath sounds normal.  Abdominal: Soft. Bowel sounds are normal. There is no splenomegaly or hepatomegaly.  Genitourinary: Rectum normal, prostate normal and penis normal.  Musculoskeletal: Normal range of motion.  Neurological: He is alert and oriented to person, place, and time. He has normal reflexes.  Skin: No rash noted. No erythema.  Psychiatric: He has a normal mood and affect. His behavior is normal. Judgment and thought content normal.    Results for orders placed or performed in visit on 12/10/17  Microscopic Examination  Result Value Ref Range   WBC, UA 0-5 0 - 5 /hpf   RBC, UA 0-2 0 - 2 /hpf   Epithelial Cells (non renal) 0-10 0 - 10 /hpf   Renal Epithel, UA 0-10 (A) None seen /hpf   Bacteria, UA None seen None seen/Few  CBC with Differential/Platelet  Result Value Ref Range   WBC 7.0 3.4 - 10.8 x10E3/uL   RBC 3.67 (L) 4.14 - 5.80 x10E6/uL   Hemoglobin 11.1 (L) 13.0 - 17.7 g/dL   Hematocrit 34.5 (L) 37.5 - 51.0 %   MCV 94 79 - 97 fL   MCH 30.2 26.6 - 33.0 pg   MCHC 32.2 31.5 - 35.7 g/dL   RDW 14.1 12.3 -  15.4 %   Platelets 222 150 - 450 x10E3/uL   Neutrophils 57 Not Estab. %   Lymphs 21 Not Estab. %   Monocytes 15 Not Estab. %   Eos 5 Not Estab. %   Basos 1 Not Estab. %   Neutrophils Absolute 4.0 1.4 - 7.0 x10E3/uL   Lymphocytes Absolute 1.5 0.7 - 3.1 x10E3/uL   Monocytes Absolute 1.1 (H) 0.1 - 0.9 x10E3/uL   EOS (ABSOLUTE) 0.4 0.0 - 0.4 x10E3/uL   Basophils Absolute 0.0 0.0 - 0.2 x10E3/uL   Immature Granulocytes 1 Not Estab. %   Immature Grans (Abs) 0.1 0.0 - 0.1 x10E3/uL  Comprehensive metabolic panel  Result Value Ref Range   Glucose 181 (H) 65 - 99 mg/dL   BUN 20 8 - 27 mg/dL   Creatinine, Ser 1.22 0.76 - 1.27 mg/dL   GFR calc non Af Amer 53  (L) >59 mL/min/1.73   GFR calc Af Amer 62 >59 mL/min/1.73   BUN/Creatinine Ratio 16 10 - 24   Sodium 137 134 - 144 mmol/L   Potassium 4.4 3.5 - 5.2 mmol/L   Chloride 103 96 - 106 mmol/L   CO2 19 (L) 20 - 29 mmol/L   Calcium 9.0 8.6 - 10.2 mg/dL   Total Protein 6.7 6.0 - 8.5 g/dL   Albumin 4.2 3.5 - 4.7 g/dL   Globulin, Total 2.5 1.5 - 4.5 g/dL   Albumin/Globulin Ratio 1.7 1.2 - 2.2   Bilirubin Total 0.5 0.0 - 1.2 mg/dL   Alkaline Phosphatase 53 39 - 117 IU/L   AST 26 0 - 40 IU/L   ALT 14 0 - 44 IU/L  Lipid panel  Result Value Ref Range   Cholesterol, Total 116 100 - 199 mg/dL   Triglycerides 317 (H) 0 - 149 mg/dL   HDL 32 (L) >39 mg/dL   VLDL Cholesterol Cal 63 (H) 5 - 40 mg/dL   LDL Calculated 21 0 - 99 mg/dL   Chol/HDL Ratio 3.6 0.0 - 5.0 ratio  TSH  Result Value Ref Range   TSH 1.950 0.450 - 4.500 uIU/mL  Urinalysis, Routine w reflex microscopic  Result Value Ref Range   Specific Gravity, UA 1.015 1.005 - 1.030   pH, UA 5.0 5.0 - 7.5   Color, UA Yellow Yellow   Appearance Ur Clear Clear   Leukocytes, UA Negative Negative   Protein, UA Trace (A) Negative/Trace   Glucose, UA 2+ (A) Negative   Ketones, UA Trace (A) Negative   RBC, UA Trace (A) Negative   Bilirubin, UA Negative Negative   Urobilinogen, Ur 0.2 0.2 - 1.0 mg/dL   Nitrite, UA Negative Negative   Microscopic Examination See below:   Bayer DCA Hb A1c Waived  Result Value Ref Range   HB A1C (BAYER DCA - WAIVED) 8.0 (H) <7.0 %      Assessment & Plan:   Problem List Items Addressed This Visit      Cardiovascular and Mediastinum   Essential hypertension - Primary    The current medical regimen is effective;  continue present plan and medications.       Relevant Medications   rosuvastatin (CRESTOR) 10 MG tablet   metoprolol succinate (TOPROL-XL) 50 MG 24 hr tablet   Other Relevant Orders   CBC with Differential/Platelet (Completed)   Comprehensive metabolic panel (Completed)   Lipid panel  (Completed)   Urinalysis, Routine w reflex microscopic (Completed)   TIA (transient ischemic attack)   Relevant Medications   rosuvastatin (CRESTOR) 10  MG tablet   metoprolol succinate (TOPROL-XL) 50 MG 24 hr tablet   AAA (abdominal aortic aneurysm) without rupture ([facility])    stable      Relevant Medications   rosuvastatin (CRESTOR) 10 MG tablet   metoprolol succinate (TOPROL-XL) 50 MG 24 hr tablet     Digestive   Dysphagia    Appointment with GI to further evaluate we will continue Protonix      Relevant Medications   pantoprazole (PROTONIX) 40 MG tablet   Other Relevant Orders   Ambulatory referral to Gastroenterology     Endocrine   DM type 2 (diabetes mellitus, type 2) ([place])    The current medical regimen is effective;  continue present plan and medications.       Relevant Medications   sitaGLIPtin (JANUVIA) 100 MG tablet   rosuvastatin (CRESTOR) 10 MG tablet   Other Relevant Orders   CBC with Differential/Platelet (Completed)   Comprehensive metabolic panel (Completed)   Lipid panel (Completed)   Urinalysis, Routine w reflex microscopic (Completed)   CBC with Differential/Platelet (Completed)   Comprehensive metabolic panel (Completed)   Lipid panel (Completed)   Urinalysis, Routine w reflex microscopic (Completed)   Hgb A1c w/o eAG     Other   HLD (hyperlipidemia)   Relevant Medications   rosuvastatin (CRESTOR) 10 MG tablet   metoprolol succinate (TOPROL-XL) 50 MG 24 hr tablet   Other Relevant Orders   CBC with Differential/Platelet (Completed)   Comprehensive metabolic panel (Completed)   Lipid panel (Completed)   Urinalysis, Routine w reflex microscopic (Completed)   Chronic anxiety   Advanced care planning/counseling discussion    A voluntary discussion about advanced care planning including explanation and discussion of advanced directives was extentively discussed with the patient.  Explained about the healthcare proxy and living will was reviewed and  packet with forms with expiration of how to fill them out was given.  Time spent: Encounter 16+ min individuals present: Patient and daughter       Other Visit Diagnoses    Prostate cancer screening       Thyroid disorder screen       Relevant Orders   TSH (Completed)       Follow up plan: Return in about 6 months (around 06/11/2018) for BMP,  Lipids, ALT, AST.

## 2017-12-11 ENCOUNTER — Other Ambulatory Visit: Payer: Self-pay | Admitting: Family Medicine

## 2017-12-11 LAB — CBC WITH DIFFERENTIAL/PLATELET
Basophils Absolute: 0 10*3/uL (ref 0.0–0.2)
Basos: 1 %
EOS (ABSOLUTE): 0.4 10*3/uL (ref 0.0–0.4)
Eos: 5 %
Hematocrit: 34.5 % — ABNORMAL LOW (ref 37.5–51.0)
Hemoglobin: 11.1 g/dL — ABNORMAL LOW (ref 13.0–17.7)
Immature Grans (Abs): 0.1 10*3/uL (ref 0.0–0.1)
Immature Granulocytes: 1 %
Lymphocytes Absolute: 1.5 10*3/uL (ref 0.7–3.1)
Lymphs: 21 %
MCH: 30.2 pg (ref 26.6–33.0)
MCHC: 32.2 g/dL (ref 31.5–35.7)
MCV: 94 fL (ref 79–97)
Monocytes Absolute: 1.1 10*3/uL — ABNORMAL HIGH (ref 0.1–0.9)
Monocytes: 15 %
Neutrophils Absolute: 4 10*3/uL (ref 1.4–7.0)
Neutrophils: 57 %
Platelets: 222 10*3/uL (ref 150–450)
RBC: 3.67 x10E6/uL — ABNORMAL LOW (ref 4.14–5.80)
RDW: 14.1 % (ref 12.3–15.4)
WBC: 7 10*3/uL (ref 3.4–10.8)

## 2017-12-11 LAB — URINALYSIS, ROUTINE W REFLEX MICROSCOPIC
Bilirubin, UA: NEGATIVE
Leukocytes, UA: NEGATIVE
Nitrite, UA: NEGATIVE
Specific Gravity, UA: 1.015 (ref 1.005–1.030)
Urobilinogen, Ur: 0.2 mg/dL (ref 0.2–1.0)
pH, UA: 5 (ref 5.0–7.5)

## 2017-12-11 LAB — COMPREHENSIVE METABOLIC PANEL
ALT: 14 IU/L (ref 0–44)
AST: 26 IU/L (ref 0–40)
Albumin/Globulin Ratio: 1.7 (ref 1.2–2.2)
Albumin: 4.2 g/dL (ref 3.5–4.7)
Alkaline Phosphatase: 53 IU/L (ref 39–117)
BUN/Creatinine Ratio: 16 (ref 10–24)
BUN: 20 mg/dL (ref 8–27)
Bilirubin Total: 0.5 mg/dL (ref 0.0–1.2)
CO2: 19 mmol/L — ABNORMAL LOW (ref 20–29)
Calcium: 9 mg/dL (ref 8.6–10.2)
Chloride: 103 mmol/L (ref 96–106)
Creatinine, Ser: 1.22 mg/dL (ref 0.76–1.27)
GFR calc Af Amer: 62 mL/min/{1.73_m2} (ref >59)
GFR calc non Af Amer: 53 mL/min/{1.73_m2} — ABNORMAL LOW (ref >59)
Globulin, Total: 2.5 g/dL (ref 1.5–4.5)
Glucose: 181 mg/dL — ABNORMAL HIGH (ref 65–99)
Potassium: 4.4 mmol/L (ref 3.5–5.2)
Sodium: 137 mmol/L (ref 134–144)
Total Protein: 6.7 g/dL (ref 6.0–8.5)

## 2017-12-11 LAB — MICROSCOPIC EXAMINATION: Bacteria, UA: NONE SEEN

## 2017-12-11 LAB — LIPID PANEL
Chol/HDL Ratio: 3.6 ratio (ref 0.0–5.0)
Cholesterol, Total: 116 mg/dL (ref 100–199)
HDL: 32 mg/dL — ABNORMAL LOW (ref >39)
LDL Calculated: 21 mg/dL (ref 0–99)
Triglycerides: 317 mg/dL — ABNORMAL HIGH (ref 0–149)
VLDL Cholesterol Cal: 63 mg/dL — ABNORMAL HIGH (ref 5–40)

## 2017-12-11 LAB — BAYER DCA HB A1C WAIVED: HB A1C (BAYER DCA - WAIVED): 8 % — ABNORMAL HIGH (ref <7.0)

## 2017-12-11 LAB — TSH: TSH: 1.95 u[IU]/mL (ref 0.450–4.500)

## 2017-12-11 NOTE — Telephone Encounter (Signed)
This encounter was created in error - please disregard.

## 2017-12-14 ENCOUNTER — Encounter: Payer: Self-pay | Admitting: Family Medicine

## 2017-12-14 MED ORDER — METOPROLOL SUCCINATE ER 50 MG PO TB24: 50.0000 mg | ORAL_TABLET | Freq: Every day | ORAL | 6 refills | Status: DC

## 2017-12-14 MED ORDER — ROSUVASTATIN CALCIUM 10 MG PO TABS: 10.0000 mg | ORAL_TABLET | Freq: Every day | ORAL | 12 refills | Status: DC

## 2017-12-14 MED ORDER — SITAGLIPTIN PHOSPHATE 100 MG PO TABS: 100.0000 mg | ORAL_TABLET | Freq: Every day | ORAL | 12 refills | Status: DC

## 2017-12-14 MED ORDER — PANTOPRAZOLE SODIUM 40 MG PO TBEC: 40.0000 mg | DELAYED_RELEASE_TABLET | Freq: Two times a day (BID) | ORAL | 12 refills | Status: DC

## 2017-12-14 MED ORDER — TRIAMCINOLONE ACETONIDE 0.1 % EX CREA: 1.0000 "application " | TOPICAL_CREAM | Freq: Two times a day (BID) | CUTANEOUS | 0 refills | Status: DC

## 2017-12-15 ENCOUNTER — Telehealth: Payer: Self-pay | Admitting: Family Medicine

## 2017-12-15 ENCOUNTER — Other Ambulatory Visit: Payer: Self-pay | Admitting: Family Medicine

## 2017-12-15 DIAGNOSIS — D510 Vitamin B12 deficiency anemia due to intrinsic factor deficiency: Secondary | ICD-10-CM

## 2017-12-15 MED ORDER — TRIAMCINOLONE ACETONIDE 0.1 % EX CREA: 1.0000 "application " | TOPICAL_CREAM | Freq: Two times a day (BID) | CUTANEOUS | 0 refills | Status: DC

## 2017-12-15 NOTE — Telephone Encounter (Signed)
Copied from [place] [phone]. Topic: Quick Communication - Rx Refill/Question >> Dec 15, 2017  3:17 PM Boyd Kerbs wrote: Medication:  triamcinolone cream (KENALOG) 0.1 %  Had computer problem yesterday and needs to have re-sent   Has the patient contacted their pharmacy? Yes.   (Agent: If no, request that the patient contact the pharmacy for the refill.) (Agent: If yes, when and what did the pharmacy advise?)  Preferred Pharmacy (with phone number or street name):  [place], [place] - [address] [place] [address] Phone: [phone] Fax: [phone]    Agent: Please be advised that RX refills may take up to 3 business days. We ask that you follow-up with your pharmacy.

## 2017-12-16 NOTE — Telephone Encounter (Signed)
Request for cyanocobalamin 1000 mcg/ml injection Prescription expired on 11/27/17 Dr. Jeananne Rama LOV  12/10/17 [facility]

## 2017-12-18 DIAGNOSIS — R1310 Dysphagia, unspecified: Secondary | ICD-10-CM | POA: Diagnosis not present

## 2017-12-18 DIAGNOSIS — R05 Cough: Secondary | ICD-10-CM | POA: Diagnosis not present

## 2017-12-21 ENCOUNTER — Other Ambulatory Visit: Payer: Self-pay | Admitting: Gastroenterology

## 2017-12-21 DIAGNOSIS — R1310 Dysphagia, unspecified: Secondary | ICD-10-CM

## 2017-12-31 ENCOUNTER — Ambulatory Visit
Admission: RE | Admit: 2017-12-31 | Discharge: 2017-12-31 | Disposition: A | Discharge status: Home / Self Care | Payer: Medicare Other | Source: Ambulatory Visit | Attending: Gastroenterology | Admitting: Gastroenterology

## 2017-12-31 DIAGNOSIS — S5002XA Contusion of left elbow, initial encounter: Secondary | ICD-10-CM | POA: Diagnosis not present

## 2017-12-31 DIAGNOSIS — R1310 Dysphagia, unspecified: Secondary | ICD-10-CM | POA: Diagnosis not present

## 2017-12-31 DIAGNOSIS — K219 Gastro-esophageal reflux disease without esophagitis: Secondary | ICD-10-CM | POA: Insufficient documentation

## 2017-12-31 DIAGNOSIS — M13822 Other specified arthritis, left elbow: Secondary | ICD-10-CM | POA: Diagnosis not present

## 2018-01-01 DIAGNOSIS — S5002XA Contusion of left elbow, initial encounter: Secondary | ICD-10-CM | POA: Insufficient documentation

## 2018-01-08 ENCOUNTER — Other Ambulatory Visit: Payer: Self-pay | Admitting: Family Medicine

## 2018-01-08 DIAGNOSIS — E785 Hyperlipidemia, unspecified: Secondary | ICD-10-CM

## 2018-01-08 DIAGNOSIS — E1159 Type 2 diabetes mellitus with other circulatory complications: Secondary | ICD-10-CM

## 2018-01-08 NOTE — Telephone Encounter (Signed)
Farxiga refill Last Refill:12/07/17 # 30  Last OV: 12/10/17 PCP: Dr Jeananne Rama Pharmacy:[facility] Last HgbA1C: 12/10/17  Rosuvastatin  refill Last Refill:12/14/17 # 30 12 RF Last OV: 12/10/17

## 2018-01-12 ENCOUNTER — Other Ambulatory Visit: Payer: Self-pay

## 2018-01-12 ENCOUNTER — Other Ambulatory Visit: Payer: Self-pay | Admitting: Family Medicine

## 2018-01-12 DIAGNOSIS — E1159 Type 2 diabetes mellitus with other circulatory complications: Secondary | ICD-10-CM

## 2018-01-12 MED ORDER — DAPAGLIFLOZIN PROPANEDIOL 10 MG PO TABS: 10.0000 mg | ORAL_TABLET | Freq: Every day | ORAL | 5 refills | Status: DC

## 2018-01-12 NOTE — Telephone Encounter (Signed)
Was sent in as 5 tablets instead of 30 needs new RX

## 2018-01-14 ENCOUNTER — Telehealth: Payer: Self-pay | Admitting: Family Medicine

## 2018-01-14 NOTE — Telephone Encounter (Signed)
Got refill request from [facility]. Rxs had already been filled.

## 2018-01-15 ENCOUNTER — Other Ambulatory Visit: Payer: Self-pay

## 2018-01-15 ENCOUNTER — Encounter: Payer: Self-pay | Admitting: Emergency Medicine

## 2018-01-15 ENCOUNTER — Emergency Department: Payer: Medicare Other

## 2018-01-15 ENCOUNTER — Emergency Department
Admission: EM | Admit: 2018-01-15 | Discharge: 2018-01-15 | Disposition: A | Discharge status: Home / Self Care | Payer: Medicare Other | Attending: Emergency Medicine | Admitting: Emergency Medicine

## 2018-01-15 DIAGNOSIS — X58XXXA Exposure to other specified factors, initial encounter: Secondary | ICD-10-CM | POA: Diagnosis not present

## 2018-01-15 DIAGNOSIS — Z7984 Long term (current) use of oral hypoglycemic drugs: Secondary | ICD-10-CM | POA: Insufficient documentation

## 2018-01-15 DIAGNOSIS — Z79899 Other long term (current) drug therapy: Secondary | ICD-10-CM | POA: Diagnosis not present

## 2018-01-15 DIAGNOSIS — L309 Dermatitis, unspecified: Secondary | ICD-10-CM

## 2018-01-15 DIAGNOSIS — S3992XA Unspecified injury of lower back, initial encounter: Secondary | ICD-10-CM | POA: Diagnosis not present

## 2018-01-15 DIAGNOSIS — Y929 Unspecified place or not applicable: Secondary | ICD-10-CM | POA: Diagnosis not present

## 2018-01-15 DIAGNOSIS — I639 Cerebral infarction, unspecified: Secondary | ICD-10-CM | POA: Diagnosis not present

## 2018-01-15 DIAGNOSIS — I509 Heart failure, unspecified: Secondary | ICD-10-CM | POA: Insufficient documentation

## 2018-01-15 DIAGNOSIS — Y999 Unspecified external cause status: Secondary | ICD-10-CM | POA: Diagnosis not present

## 2018-01-15 DIAGNOSIS — M545 Low back pain, unspecified: Secondary | ICD-10-CM

## 2018-01-15 DIAGNOSIS — I110 Hypertensive heart disease with heart failure: Secondary | ICD-10-CM | POA: Insufficient documentation

## 2018-01-15 DIAGNOSIS — Z7901 Long term (current) use of anticoagulants: Secondary | ICD-10-CM | POA: Insufficient documentation

## 2018-01-15 DIAGNOSIS — L259 Unspecified contact dermatitis, unspecified cause: Secondary | ICD-10-CM | POA: Diagnosis not present

## 2018-01-15 DIAGNOSIS — R55 Syncope and collapse: Secondary | ICD-10-CM | POA: Diagnosis not present

## 2018-01-15 DIAGNOSIS — Z743 Need for continuous supervision: Secondary | ICD-10-CM | POA: Diagnosis not present

## 2018-01-15 DIAGNOSIS — I714 Abdominal aortic aneurysm, without rupture: Secondary | ICD-10-CM | POA: Diagnosis not present

## 2018-01-15 DIAGNOSIS — M549 Dorsalgia, unspecified: Secondary | ICD-10-CM | POA: Diagnosis not present

## 2018-01-15 DIAGNOSIS — Y939 Activity, unspecified: Secondary | ICD-10-CM | POA: Insufficient documentation

## 2018-01-15 DIAGNOSIS — S39012A Strain of muscle, fascia and tendon of lower back, initial encounter: Secondary | ICD-10-CM | POA: Diagnosis not present

## 2018-01-15 DIAGNOSIS — E119 Type 2 diabetes mellitus without complications: Secondary | ICD-10-CM | POA: Insufficient documentation

## 2018-01-15 DIAGNOSIS — Z136 Encounter for screening for cardiovascular disorders: Secondary | ICD-10-CM | POA: Diagnosis not present

## 2018-01-15 LAB — COMPREHENSIVE METABOLIC PANEL
ALT: 8 U/L (ref 0–44)
AST: 31 U/L (ref 15–41)
Albumin: 3.7 g/dL (ref 3.5–5.0)
Alkaline Phosphatase: 47 U/L (ref 38–126)
Anion gap: 10 (ref 5–15)
BUN: 22 mg/dL (ref 8–23)
CO2: 21 mmol/L — ABNORMAL LOW (ref 22–32)
Calcium: 8.8 mg/dL — ABNORMAL LOW (ref 8.9–10.3)
Chloride: 105 mmol/L (ref 98–111)
Creatinine, Ser: 1.3 mg/dL — ABNORMAL HIGH (ref 0.61–1.24)
GFR calc Af Amer: 56 mL/min — ABNORMAL LOW (ref >60)
GFR calc non Af Amer: 48 mL/min — ABNORMAL LOW (ref >60)
Glucose, Bld: 202 mg/dL — ABNORMAL HIGH (ref 70–99)
Potassium: 4.2 mmol/L (ref 3.5–5.1)
Sodium: 136 mmol/L (ref 135–145)
Total Bilirubin: 0.6 mg/dL (ref 0.3–1.2)
Total Protein: 6.8 g/dL (ref 6.5–8.1)

## 2018-01-15 LAB — CBC WITH DIFFERENTIAL/PLATELET
Basophils Absolute: 0.1 10*3/uL (ref 0–0.1)
Basophils Relative: 1 %
Eosinophils Absolute: 0.5 10*3/uL (ref 0–0.7)
Eosinophils Relative: 6 %
HCT: 32.2 % — ABNORMAL LOW (ref 40.0–52.0)
Hemoglobin: 11.2 g/dL — ABNORMAL LOW (ref 13.0–18.0)
Lymphocytes Relative: 20 %
Lymphs Abs: 1.5 10*3/uL (ref 1.0–3.6)
MCH: 31.6 pg (ref 26.0–34.0)
MCHC: 34.7 g/dL (ref 32.0–36.0)
MCV: 91.1 fL (ref 80.0–100.0)
Monocytes Absolute: 0.9 10*3/uL (ref 0.2–1.0)
Monocytes Relative: 13 %
Neutro Abs: 4.4 10*3/uL (ref 1.4–6.5)
Neutrophils Relative %: 60 %
Platelets: 205 10*3/uL (ref 150–440)
RBC: 3.54 MIL/uL — ABNORMAL LOW (ref 4.40–5.90)
RDW: 14.9 % — ABNORMAL HIGH (ref 11.5–14.5)
WBC: 7.4 10*3/uL (ref 3.8–10.6)

## 2018-01-15 LAB — URINALYSIS, COMPLETE (UACMP) WITH MICROSCOPIC
Bacteria, UA: NONE SEEN
Bilirubin Urine: NEGATIVE
Glucose, UA: >=500 mg/dL — AB
Hgb urine dipstick: NEGATIVE
Ketones, ur: 5 mg/dL — AB
Leukocytes, UA: NEGATIVE
Nitrite: NEGATIVE
Protein, ur: NEGATIVE mg/dL
Specific Gravity, Urine: 1.03 (ref 1.005–1.030)
Squamous Epithelial / LPF: NONE SEEN (ref 0–5)
pH: 5 (ref 5.0–8.0)

## 2018-01-15 MED ORDER — TRAMADOL HCL 50 MG PO TABS: 50.0000 mg | ORAL_TABLET | Freq: Three times a day (TID) | ORAL | 0 refills | Status: DC | PRN

## 2018-01-15 MED ORDER — MOMETASONE FUROATE 0.1 % EX CREA: 1.0000 "application " | TOPICAL_CREAM | Freq: Every day | CUTANEOUS | 0 refills | Status: DC

## 2018-01-15 MED ORDER — OXYCODONE-ACETAMINOPHEN 5-325 MG PO TABS
1.0000 | ORAL_TABLET | Freq: Once | ORAL | Status: AC
  Administered 2018-01-15: 1 via ORAL
  Filled 2018-01-15: qty 1

## 2018-01-15 NOTE — ED Notes (Signed)
[facility] transport at bedside. Pt's daughter notified to meet him at home.

## 2018-01-15 NOTE — ED Triage Notes (Signed)
Pt presents to ED via [facility] from home c/o lower back pain since yesterday, exacerbated by activity. Pt states pain is tolerable at rest. Hx back surgery. Reports fall 1.5 weeks agoa nd was examined by EMS but not transported at that time. States tylenol taken at home.

## 2018-01-15 NOTE — ED Provider Notes (Signed)
[facility] Emergency Department Provider Note  ____________________________________________   First MD Initiated Contact with Patient 01/15/18 1133     (approximate)  I have reviewed the triage vital signs and the nursing notes.   HISTORY  Chief Complaint Back Pain   HPI Timothy [name] is a 84 y.o. male is here via EMS with complaint of low back pain.  Patient states that his low back pain began yesterday when he went out for dinner.  He states that when he arrived back home it became worse and he sat in a recliner for part of the night.  He took Tylenol without any relief.  Pain is tolerable until he moves.  Patient gives a history of falling 1,5 weeks ago and was examined by EMS.  Patient was not transported at that time.  He denies head injury with his fall.  He also has a history of back surgery, AAA, hypertension, diabetes type 2.  He denies any incontinence of bowel bladder, kidney stones, or paresthesias into his lower extremities.  He rates his pain as a 10/10.  Past Medical History:  Diagnosis Date  . Back pain   . CHF (congestive heart failure) ([place])   . Esophageal reflux   . 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 Senile osteoporosis   . Skin cancer   . TIA (transient ischemic attack)     Patient Active Problem List   Diagnosis Date Noted  . Dysphagia 12/10/2017  . Advanced care planning/counseling discussion 11/27/2016  . Pernicious anemia 05/28/2015  . Leg weakness, bilateral 04/12/2015  . Tremor 03/27/2015  . Chronic anxiety 02/20/2015  . SVT (supraventricular tachycardia) ([place]) 02/16/2015  . Paroxysmal atrial fibrillation ([place]) 02/16/2015  . History of back surgery 02/16/2015  . AAA (abdominal aortic aneurysm) without rupture ([place]) 10/07/2012  . TIA (transient ischemic attack) 12/29/2011  . HLD (hyperlipidemia) 09/24/2011  . Essential hypertension 09/24/2011  . DM type 2 (diabetes mellitus, type 2)  ([place]) 09/24/2011    Past Surgical History:  Procedure Laterality Date  . APPENDECTOMY    . BACK SURGERY    . CATARACT EXTRACTION    . COLONOSCOPY  [id number]  . ESOPHAGEAL DILATION    . GALLBLADDER SURGERY    . HERNIA REPAIR    . RETINAL DETACHMENT SURGERY    . TONSILLECTOMY      Prior to Admission medications   Medication Sig Start Date End Date Taking? Authorizing Provider  acetaminophen (TYLENOL) 500 MG tablet Take 1,000 mg by mouth every 8 (eight) hours as needed.     [provider]  apixaban (ELIQUIS) 5 MG TABS tablet Take 1 tablet (5 mg total) by mouth 2 (two) times daily. 08/19/17   Guadalupe Maple, MD  benazepril (LOTENSIN) 5 MG tablet Take 1 tablet (5 mg total) by mouth daily. 10/16/17   Volney American, PA-C  Calcium Citrate-Vitamin D (CALCIUM + D PO) Take 1 tablet by mouth 2 (two) times daily.     [provider]  carbidopa-levodopa (SINEMET) 25-100 MG tablet Take 100 mg by mouth 4 (four) times daily.     [provider]  cyanocobalamin (,VITAMIN B-12,) 1000 MCG/ML injection INJECT 1 ML INTO THE MUSCLE EVERY 30 DAYS 12/16/17   Guadalupe Maple, MD  dapagliflozin propanediol (FARXIGA) 10 MG TABS tablet Take 10 mg by mouth daily. 01/12/18   Guadalupe Maple, MD  DIGOX 125 MCG tablet TAKE ONE TABLET EVERY DAY  06/19/17   [name], MD  diltiazem (CARDIZEM) 30 MG tablet Take 1 tablet (30 mg total) by mouth 3 (three) times daily as needed. 05/06/17   [name], MD  flecainide (TAMBOCOR) 50 MG tablet TAKE 1 TABLET BY MOUTH TWICE DAILY 12/18/16   [name], MD  flecainide (TAMBOCOR) 50 MG tablet TAKE ONE TABLET TWICE DAILY 12/07/17   [name], MD  glipiZIDE-metformin (METAGLIP) 5-500 MG tablet Take 1 tablet by mouth 2 (two) times daily. 10/06/17   Johnson, Megan P, DO  GLUCOSAMINE-CHONDROITIN-VIT D3 PO Take by mouth daily.    [provider]  metoprolol succinate (TOPROL-XL) 50 MG 24 hr tablet TAKE 1 TABLET BY MOUTH  DAILY 10/29/17   Guadalupe Maple, MD  metoprolol succinate (TOPROL-XL) 50 MG 24 hr tablet Take 1 tablet (50 mg total) by mouth daily. Take with or immediately following a meal. 12/14/17   Crissman, Jeannette How, MD  mometasone (ELOCON) 0.1 % cream Apply 1 application topically daily. 01/15/18   Johnn Hai, PA-C  Multiple Vitamins-Minerals (CEROVITE SENIOR PO) Take 1 tablet by mouth daily.    [provider]  pantoprazole (PROTONIX) 40 MG tablet Take 1 tablet (40 mg total) by mouth 2 (two) times daily. 12/14/17   Guadalupe Maple, MD  Probiotic Product ([place]) Take by mouth.    [provider]  rosuvastatin (CRESTOR) 10 MG tablet Take 1 tablet (10 mg total) by mouth daily. 12/14/17   Guadalupe Maple, MD  rosuvastatin (CRESTOR) 10 MG tablet TAKE ONE TABLET BY MOUTH EVERY DAY 01/08/18   Guadalupe Maple, MD  sitaGLIPtin (JANUVIA) 100 MG tablet Take 1 tablet (100 mg total) by mouth daily. 12/14/17   Guadalupe Maple, MD  torsemide (DEMADEX) 20 MG tablet Take 1 tablet (20 mg total) by mouth daily as needed. 04/09/17   [name], MD  traMADol (ULTRAM) 50 MG tablet Take 1 tablet (50 mg total) by mouth every 8 (eight) hours as needed. 01/15/18   Johnn Hai, PA-C  triamcinolone cream (KENALOG) 0.1 % Apply 1 application topically 2 (two) times daily. 12/15/17   Guadalupe Maple, MD    Allergies Patient has no known allergies.  Family History  Problem Relation Age of Onset  . Heart disease Mother   . Diabetes Unknown        grandparents  . Heart disease Brother   . Stroke Brother     Social History Social History   Tobacco Use  . Smoking status: Never Smoker  . Smokeless tobacco: Never Used  Substance Use Topics  . Alcohol use: No  . Drug use: No    Review of Systems Constitutional: No fever/chills Eyes: No visual changes. ENT: No sore throat. Cardiovascular: Denies chest pain. Respiratory: Denies shortness of breath. Gastrointestinal: No  abdominal pain.  No nausea, no vomiting.   Genitourinary: Negative for dysuria.  Negative for hematuria. Musculoskeletal: Positive for low back pain. Skin: Negative for rash. Neurological: Negative for headaches, focal weakness or numbness. ____________________________________________   PHYSICAL EXAM:  VITAL SIGNS: ED Triage Vitals  Enc Vitals Group     BP 01/15/18 1132 117/89     Pulse Rate 01/15/18 1132 63     Resp 01/15/18 1132 18     Temp 01/15/18 1132 98.2 F (36.8 C)     Temp Source 01/15/18 1132 Oral     SpO2 01/15/18 1132 97 %     Weight 01/15/18 1133  173 lb (78.5 kg)     Height 01/15/18 1133 5\' 10"  (1.778 m)     Head Circumference --      Peak Flow --      Pain Score 01/15/18 1132 10     Pain Loc --      Pain Edu? --      Excl. in [place]? --    Constitutional: Alert and oriented. Well appearing and in no acute distress. Eyes: Conjunctivae are normal. PERRL. EOMI. Head: Atraumatic. Nose: No trauma. Neck: No stridor.  No cervical tenderness on palpation posteriorly.  No evidence of trauma such as abrasions, ecchymosis or soft tissue edema. Cardiovascular: Heart rate regular rate and rhythm with a systolic murmur noted.  good peripheral circulation. Respiratory: Normal respiratory effort.  No retractions. Lungs CTAB. Gastrointestinal: Soft and nontender. No abdominal bruits. No CVA tenderness. Musculoskeletal: Examination of the back is positive for point tenderness on palpation of L5-S1 area.  There is also some paravertebral muscle tenderness.  Range of motion is restricted secondary to patient's pain.  Straight leg raises were negative.  Patient is able to move lower extremities with little difficulties. Neurologic:  Normal speech and language. No gross focal neurologic deficits are appreciated.  Skin:  Skin is warm, dry and intact.  Multiple bruises in different stages of resolve noted on the extremities.  There is also an area of dry patchy skin to the left base of the  neck.  No vesicles or pustules noted.  Scaliness is consistent with eczema which the patient has had in the past. Psychiatric: Mood and affect are normal. Speech and behavior are normal.  ____________________________________________   LABS (all labs ordered are listed, but only abnormal results are displayed)  Labs Reviewed  URINALYSIS, COMPLETE (UACMP) WITH MICROSCOPIC - Abnormal; Notable for the following components:      Result Value   Color, Urine YELLOW (*)    APPearance CLEAR (*)    Glucose, UA >=500 (*)    Ketones, ur 5 (*)    All other components within normal limits  CBC WITH DIFFERENTIAL/PLATELET - Abnormal; Notable for the following components:   RBC 3.54 (*)    Hemoglobin 11.2 (*)    HCT 32.2 (*)    RDW 14.9 (*)    All other components within normal limits  COMPREHENSIVE METABOLIC PANEL - Abnormal; Notable for the following components:   CO2 21 (*)    Glucose, Bld 202 (*)    Creatinine, Ser 1.30 (*)    Calcium 8.8 (*)    GFR calc non Af Amer 48 (*)    GFR calc Af Amer 56 (*)    All other components within normal limits     RADIOLOGY  Official radiology report(s): Dg Lumbar Spine 2-3 Views  Result Date: 01/15/2018 CLINICAL DATA:  Fall.  Back pain. EXAM: LUMBAR SPINE - 2-3 VIEW COMPARISON:  CT abdomen pelvis 09/21/2016, lumbar spine x-rays 07/26/2013 FINDINGS: Pedicle screw and interbody fusion L3-4 L4-5. Hardware in good position. Disc degeneration and mild retrolisthesis L1-2 and L2-3. Mild anterolisthesis L4-5. Negative for fracture IMPRESSION: Negative for fracture.  PLIF L3-4 and L4-5 Electronically Signed   By: Franchot Gallo M.D.   On: 01/15/2018 15:36    ____________________________________________   PROCEDURES  Procedure(s) performed: None  Procedures  Critical Care performed: No  ____________________________________________   INITIAL IMPRESSION / ASSESSMENT AND PLAN / ED COURSE  As part of my medical decision making, I reviewed the  following data within the electronic medical  record:  Notes from prior ED visits and [place] Controlled Substance Database  Patient improved on pain medication while in the ED.  We discussed pain medication that he could take at home with less drowsiness and decrease risk of falling since he states he falls enough with his Parkinson's.  He is encouraged to use his walker when up walking.  A prescription for tramadol 50 mg 1 every 8 hours as needed for his back pain.  Elocon cream to apply once a day to his neck as needed for eczema.  Patient lives alone and at this time his daughter is present and most likely he will be living with her until he ambulates better.  He is encouraged to follow-up with his PCP if any continued problems.  Ultrasound for evaluation of his AAA was reassuring that this was not the cause of his back pain.   ____________________________________________   FINAL CLINICAL IMPRESSION(S) / ED DIAGNOSES  Final diagnoses:  Back pain  Acute midline low back pain without sciatica  Strain of lumbar region, initial encounter  Eczema, unspecified type     ED Discharge Orders        Ordered    traMADol (ULTRAM) 50 MG tablet  Every 8 hours PRN     01/15/18 1603    mometasone (ELOCON) 0.1 % cream  Daily     01/15/18 1607       Note:  This document was prepared using Dragon voice recognition software and may include unintentional dictation errors.    Johnn Hai, PA-C 01/16/18 1455    Carrie Mew, MD 01/18/18 2239

## 2018-01-15 NOTE — Discharge Instructions (Addendum)
Follow-up with your primary care provider if any continued problems with your back.  Urinalysis today did not show any thing that was suspicious for kidney stone.  X-rays did not show an acute injury to the bones in your back and the hardware from your previous surgeries is not disturbed.  You may take Tylenol if needed.  Tramadol is a pain medication that can be taken every 8 hours if needed when Tylenol is not helping.  This medication could cause drowsiness and increase your risk for injury.  Use your walker when up walking.  You may use ice or heat to your back as needed for discomfort.  Follow-up with your primary care provider if any continued problems.  Begin using Elocon cream to your rash as needed.  This medication is once a day.  If not improving follow-up with Dr. Jeananne Rama or [facility].

## 2018-01-15 NOTE — ED Notes (Signed)
Patient's discharge and follow up information reviewed with patient by ED nursing staff and patient given the opportunity to ask questions pertaining to ED visit and discharge plan of care. Patient advised that should symptoms not continue to improve, resolve entirely, or should new symptoms develop then a follow up visit with their PCP or a return visit to the ED may be warranted. Patient verbalized consent and understanding of discharge plan of care including potential need for further evaluation. Patient discharged in stable condition per attending ED physician on duty.   Pt's discharge papers and Rx given to pt's daughter per pt's request.

## 2018-01-15 NOTE — ED Notes (Signed)
EMS transport called.

## 2018-01-19 ENCOUNTER — Other Ambulatory Visit: Payer: Self-pay

## 2018-01-19 DIAGNOSIS — I10 Essential (primary) hypertension: Secondary | ICD-10-CM

## 2018-01-19 MED ORDER — BENAZEPRIL HCL 5 MG PO TABS: 5.0000 mg | ORAL_TABLET | Freq: Every day | ORAL | 1 refills | Status: DC

## 2018-01-19 NOTE — Telephone Encounter (Signed)
Fax from pharmacy. Refill request for benazepril 5mg . Last seen 12/10/2017

## 2018-01-26 ENCOUNTER — Other Ambulatory Visit: Payer: Self-pay | Admitting: Family Medicine

## 2018-01-31 ENCOUNTER — Other Ambulatory Visit: Payer: Self-pay

## 2018-01-31 ENCOUNTER — Emergency Department: Payer: Medicare Other

## 2018-01-31 ENCOUNTER — Observation Stay
Admission: EM | Admit: 2018-01-31 | Discharge: 2018-02-02 | Disposition: A | Discharge status: Home Health | Payer: Medicare Other | Attending: Internal Medicine | Admitting: Internal Medicine

## 2018-01-31 ENCOUNTER — Encounter: Payer: Self-pay | Admitting: Emergency Medicine

## 2018-01-31 DIAGNOSIS — K219 Gastro-esophageal reflux disease without esophagitis: Secondary | ICD-10-CM | POA: Insufficient documentation

## 2018-01-31 DIAGNOSIS — I951 Orthostatic hypotension: Secondary | ICD-10-CM | POA: Diagnosis not present

## 2018-01-31 DIAGNOSIS — R55 Syncope and collapse: Secondary | ICD-10-CM | POA: Diagnosis present

## 2018-01-31 DIAGNOSIS — G20 Parkinson's disease: Secondary | ICD-10-CM | POA: Diagnosis not present

## 2018-01-31 DIAGNOSIS — I700 Atherosclerosis of aorta: Secondary | ICD-10-CM | POA: Insufficient documentation

## 2018-01-31 DIAGNOSIS — Z7984 Long term (current) use of oral hypoglycemic drugs: Secondary | ICD-10-CM | POA: Diagnosis not present

## 2018-01-31 DIAGNOSIS — R1032 Left lower quadrant pain: Secondary | ICD-10-CM | POA: Diagnosis not present

## 2018-01-31 DIAGNOSIS — Z79899 Other long term (current) drug therapy: Secondary | ICD-10-CM | POA: Insufficient documentation

## 2018-01-31 DIAGNOSIS — I110 Hypertensive heart disease with heart failure: Secondary | ICD-10-CM | POA: Insufficient documentation

## 2018-01-31 DIAGNOSIS — Z7901 Long term (current) use of anticoagulants: Secondary | ICD-10-CM | POA: Diagnosis not present

## 2018-01-31 DIAGNOSIS — Z8679 Personal history of other diseases of the circulatory system: Secondary | ICD-10-CM | POA: Diagnosis not present

## 2018-01-31 DIAGNOSIS — R110 Nausea: Secondary | ICD-10-CM | POA: Diagnosis not present

## 2018-01-31 DIAGNOSIS — E785 Hyperlipidemia, unspecified: Secondary | ICD-10-CM | POA: Insufficient documentation

## 2018-01-31 DIAGNOSIS — R001 Bradycardia, unspecified: Secondary | ICD-10-CM | POA: Diagnosis not present

## 2018-01-31 DIAGNOSIS — R531 Weakness: Secondary | ICD-10-CM | POA: Diagnosis not present

## 2018-01-31 DIAGNOSIS — K5730 Diverticulosis of large intestine without perforation or abscess without bleeding: Secondary | ICD-10-CM | POA: Diagnosis not present

## 2018-01-31 DIAGNOSIS — I5032 Chronic diastolic (congestive) heart failure: Secondary | ICD-10-CM | POA: Diagnosis not present

## 2018-01-31 DIAGNOSIS — N400 Enlarged prostate without lower urinary tract symptoms: Secondary | ICD-10-CM | POA: Insufficient documentation

## 2018-01-31 DIAGNOSIS — E119 Type 2 diabetes mellitus without complications: Secondary | ICD-10-CM | POA: Insufficient documentation

## 2018-01-31 DIAGNOSIS — I480 Paroxysmal atrial fibrillation: Secondary | ICD-10-CM | POA: Diagnosis not present

## 2018-01-31 DIAGNOSIS — I714 Abdominal aortic aneurysm, without rupture: Secondary | ICD-10-CM | POA: Insufficient documentation

## 2018-01-31 DIAGNOSIS — I10 Essential (primary) hypertension: Secondary | ICD-10-CM

## 2018-01-31 DIAGNOSIS — Z8673 Personal history of transient ischemic attack (TIA), and cerebral infarction without residual deficits: Secondary | ICD-10-CM | POA: Diagnosis not present

## 2018-01-31 LAB — URINALYSIS, COMPLETE (UACMP) WITH MICROSCOPIC
Bacteria, UA: NONE SEEN
Bilirubin Urine: NEGATIVE
Glucose, UA: >=500 mg/dL — AB
Hgb urine dipstick: NEGATIVE
Ketones, ur: NEGATIVE mg/dL
Leukocytes, UA: NEGATIVE
Nitrite: NEGATIVE
Protein, ur: NEGATIVE mg/dL
Specific Gravity, Urine: 1.028 (ref 1.005–1.030)
Squamous Epithelial / HPF: NONE SEEN (ref 0–5)
pH: 5 (ref 5.0–8.0)

## 2018-01-31 LAB — COMPREHENSIVE METABOLIC PANEL
ALT: 6 U/L (ref 0–44)
AST: 26 U/L (ref 15–41)
Albumin: 4.1 g/dL (ref 3.5–5.0)
Alkaline Phosphatase: 40 U/L (ref 38–126)
Anion gap: 7 (ref 5–15)
BUN: 19 mg/dL (ref 8–23)
CO2: 24 mmol/L (ref 22–32)
Calcium: 8.5 mg/dL — ABNORMAL LOW (ref 8.9–10.3)
Chloride: 104 mmol/L (ref 98–111)
Creatinine, Ser: 1.37 mg/dL — ABNORMAL HIGH (ref 0.61–1.24)
GFR calc Af Amer: 52 mL/min — ABNORMAL LOW (ref >60)
GFR calc non Af Amer: 45 mL/min — ABNORMAL LOW (ref >60)
Glucose, Bld: 200 mg/dL — ABNORMAL HIGH (ref 70–99)
Potassium: 4 mmol/L (ref 3.5–5.1)
Sodium: 135 mmol/L (ref 135–145)
Total Bilirubin: 0.5 mg/dL (ref 0.3–1.2)
Total Protein: 7 g/dL (ref 6.5–8.1)

## 2018-01-31 LAB — MAGNESIUM: Magnesium: 2.4 mg/dL (ref 1.7–2.4)

## 2018-01-31 LAB — CBC WITH DIFFERENTIAL/PLATELET
Basophils Absolute: 0.1 10*3/uL (ref 0–0.1)
Basophils Relative: 1 %
Eosinophils Absolute: 0.3 10*3/uL (ref 0–0.7)
Eosinophils Relative: 4 %
HCT: 32.6 % — ABNORMAL LOW (ref 40.0–52.0)
Hemoglobin: 10.9 g/dL — ABNORMAL LOW (ref 13.0–18.0)
Lymphocytes Relative: 15 %
Lymphs Abs: 1.3 10*3/uL (ref 1.0–3.6)
MCH: 30.8 pg (ref 26.0–34.0)
MCHC: 33.3 g/dL (ref 32.0–36.0)
MCV: 92.5 fL (ref 80.0–100.0)
Monocytes Absolute: 0.9 10*3/uL (ref 0.2–1.0)
Monocytes Relative: 12 %
Neutro Abs: 5.5 10*3/uL (ref 1.4–6.5)
Neutrophils Relative %: 68 %
Platelets: 177 10*3/uL (ref 150–440)
RBC: 3.53 MIL/uL — ABNORMAL LOW (ref 4.40–5.90)
RDW: 14.9 % — ABNORMAL HIGH (ref 11.5–14.5)
WBC: 8.1 10*3/uL (ref 3.8–10.6)

## 2018-01-31 LAB — GLUCOSE, CAPILLARY: Glucose-Capillary: 229 mg/dL — ABNORMAL HIGH (ref 70–99)

## 2018-01-31 LAB — TROPONIN I: Troponin I: <0.03 ng/mL (ref <0.03)

## 2018-01-31 LAB — DIGOXIN LEVEL: Digoxin Level: 0.9 ng/mL (ref 0.8–2.0)

## 2018-01-31 MED ORDER — ACETAMINOPHEN 650 MG RE SUPP: 650.0000 mg | Freq: Four times a day (QID) | RECTAL | Status: DC | PRN

## 2018-01-31 MED ORDER — INSULIN ASPART 100 UNIT/ML ~~LOC~~ SOLN
0.0000 [IU] | Freq: Three times a day (TID) | SUBCUTANEOUS | Status: DC
  Administered 2018-02-01: 9 [IU] via SUBCUTANEOUS
  Administered 2018-02-01: 3 [IU] via SUBCUTANEOUS
  Administered 2018-02-01: 2 [IU] via SUBCUTANEOUS
  Administered 2018-02-02: 5 [IU] via SUBCUTANEOUS
  Administered 2018-02-02: 2 [IU] via SUBCUTANEOUS
  Filled 2018-01-31 (×5): qty 1

## 2018-01-31 MED ORDER — APIXABAN 5 MG PO TABS
5.0000 mg | ORAL_TABLET | Freq: Two times a day (BID) | ORAL | Status: DC
  Administered 2018-01-31 – 2018-02-02 (×4): 5 mg via ORAL
  Filled 2018-01-31 (×4): qty 1

## 2018-01-31 MED ORDER — PANTOPRAZOLE SODIUM 40 MG PO TBEC
40.0000 mg | DELAYED_RELEASE_TABLET | Freq: Two times a day (BID) | ORAL | Status: DC
  Administered 2018-01-31 – 2018-02-02 (×4): 40 mg via ORAL
  Filled 2018-01-31 (×4): qty 1

## 2018-01-31 MED ORDER — INSULIN ASPART 100 UNIT/ML ~~LOC~~ SOLN
0.0000 [IU] | Freq: Every day | SUBCUTANEOUS | Status: DC
  Administered 2018-01-31: 2 [IU] via SUBCUTANEOUS
  Filled 2018-01-31: qty 1

## 2018-01-31 MED ORDER — ROSUVASTATIN CALCIUM 10 MG PO TABS
10.0000 mg | ORAL_TABLET | Freq: Every evening | ORAL | Status: DC
  Administered 2018-01-31 – 2018-02-01 (×2): 10 mg via ORAL
  Filled 2018-01-31 (×2): qty 1

## 2018-01-31 MED ORDER — CARBIDOPA-LEVODOPA 25-100 MG PO TABS
1.0000 | ORAL_TABLET | Freq: Four times a day (QID) | ORAL | Status: DC
  Administered 2018-01-31 – 2018-02-02 (×8): 1 via ORAL
  Filled 2018-01-31 (×10): qty 1

## 2018-01-31 MED ORDER — SENNOSIDES-DOCUSATE SODIUM 8.6-50 MG PO TABS: 1.0000 | ORAL_TABLET | Freq: Every evening | ORAL | Status: DC | PRN

## 2018-01-31 MED ORDER — SODIUM CHLORIDE 0.9% FLUSH
3.0000 mL | Freq: Two times a day (BID) | INTRAVENOUS | Status: DC
  Administered 2018-02-01: 3 mL via INTRAVENOUS

## 2018-01-31 MED ORDER — ONDANSETRON HCL 4 MG PO TABS: 4.0000 mg | ORAL_TABLET | Freq: Four times a day (QID) | ORAL | Status: DC | PRN

## 2018-01-31 MED ORDER — SODIUM CHLORIDE 0.9 % IV SOLN: 250.0000 mL | INTRAVENOUS | Status: DC | PRN

## 2018-01-31 MED ORDER — ONDANSETRON HCL 4 MG/2ML IJ SOLN: 4.0000 mg | Freq: Four times a day (QID) | INTRAMUSCULAR | Status: DC | PRN

## 2018-01-31 MED ORDER — SODIUM CHLORIDE 0.9% FLUSH: 3.0000 mL | INTRAVENOUS | Status: DC | PRN

## 2018-01-31 MED ORDER — ALBUTEROL SULFATE (2.5 MG/3ML) 0.083% IN NEBU: 2.5000 mg | INHALATION_SOLUTION | RESPIRATORY_TRACT | Status: DC | PRN

## 2018-01-31 MED ORDER — BISACODYL 5 MG PO TBEC: 5.0000 mg | DELAYED_RELEASE_TABLET | Freq: Every day | ORAL | Status: DC | PRN

## 2018-01-31 MED ORDER — ACETAMINOPHEN 325 MG PO TABS: 650.0000 mg | ORAL_TABLET | Freq: Four times a day (QID) | ORAL | Status: DC | PRN

## 2018-01-31 MED ORDER — FLECAINIDE ACETATE 50 MG PO TABS
50.0000 mg | ORAL_TABLET | Freq: Two times a day (BID) | ORAL | Status: DC
  Administered 2018-01-31 – 2018-02-02 (×4): 50 mg via ORAL
  Filled 2018-01-31 (×5): qty 1

## 2018-01-31 MED ORDER — SODIUM CHLORIDE 0.9 % IV SOLN
INTRAVENOUS | Status: DC
  Administered 2018-01-31 – 2018-02-01 (×2): via INTRAVENOUS

## 2018-01-31 NOTE — Progress Notes (Signed)
Advanced Care Plan.  Purpose of Encounter: CODE STATUS. Parties in Attendance: The patient, his daughter, RN and me. Patient's Decisional Capacity: Yes. Medical Story: Timothy [name]  is a 84 y.o. male with a known history of CHF, TIA, proximal A. fib, AAA, hypertension, hyperlipidemia, diabetes, osteoporosis and back pain. He was sitting in the chair and got very sweaty and then almost pass out.  He is being admitted for near syncope.  I discussed with the patient and his daughter about his current condition, prognosis and CODE STATUS.  The patient want to be resuscitated and intubated but does not want on long-term ventilation machine. Plan:  Code Status: Full code. Time spent discussing advance care planning: 17 minutes.

## 2018-01-31 NOTE — H&P (Signed)
[facility] [place] NAME: Timothy [name]    MR#:  [id number]  DATE OF BIRTH:  02-10-1929  DATE OF ADMISSION:  01/31/2018  PRIMARY CARE PHYSICIAN: Guadalupe Maple, MD   REQUESTING/REFERRING PHYSICIAN: Dr. Rip Harbour.  CHIEF COMPLAINT:   Chief Complaint  Patient presents with  . Near Syncope   Near syncope today. HISTORY OF PRESENT ILLNESS:  Timothy [name]  is a 84 y.o. male with a known history of CHF, TIA, proximal A. fib, AAA, hypertension, hyperlipidemia, diabetes, osteoporosis and back pain.  The patient was sent to ED due to above chief complaints.  He was sitting in the chair and got very sweaty and then almost pass out.  He cannot remember what happened to him.  He reported a recent with blanket around him.  He denies any other symptoms.  He also had one episode of syncope when he went to the bathroom several months ago.  Glucose EEG was 200. PAST MEDICAL HISTORY:   Past Medical History:  Diagnosis Date  . Back pain   . CHF (congestive heart failure) ([place])   . Esophageal reflux   . 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 Senile osteoporosis   . Skin cancer   . TIA (transient ischemic attack)     PAST SURGICAL HISTORY:   Past Surgical History:  Procedure Laterality Date  . APPENDECTOMY    . BACK SURGERY    . CATARACT EXTRACTION    . COLONOSCOPY  [id number]  . ESOPHAGEAL DILATION    . GALLBLADDER SURGERY    . HERNIA REPAIR    . RETINAL DETACHMENT SURGERY    . TONSILLECTOMY      SOCIAL HISTORY:   Social History   Tobacco Use  . Smoking status: Never Smoker  . Smokeless tobacco: Never Used  Substance Use Topics  . Alcohol use: No    FAMILY HISTORY:   Family History  Problem Relation Age of Onset  . Heart disease Mother   . Diabetes Unknown        grandparents  . Heart disease Brother   . Stroke Brother     DRUG ALLERGIES:  No Known Allergies  REVIEW OF SYSTEMS:   Review of  Systems  Constitutional: Positive for diaphoresis and malaise/fatigue. Negative for chills and fever.  HENT: Negative for sore throat.   Eyes: Negative for blurred vision and double vision.  Respiratory: Negative for cough, hemoptysis, shortness of breath, wheezing and stridor.   Cardiovascular: Negative for chest pain, palpitations, orthopnea and leg swelling.  Gastrointestinal: Positive for nausea. Negative for abdominal pain, blood in stool, diarrhea, melena and vomiting.  Genitourinary: Negative for dysuria, flank pain and hematuria.  Musculoskeletal: Negative for back pain and joint pain.  Neurological: Positive for dizziness. Negative for sensory change, speech change, focal weakness, seizures, weakness and headaches.       Near syncope and blackout.  Endo/Heme/Allergies: Negative for polydipsia.  Psychiatric/Behavioral: Negative for depression. The patient is not nervous/anxious.     MEDICATIONS AT HOME:   Prior to Admission medications   Medication Sig Start Date End Date Taking? Authorizing Provider  apixaban (ELIQUIS) 5 MG TABS tablet Take 1 tablet (5 mg total) by mouth 2 (two) times daily. 08/19/17  Yes Crissman, Jeannette How, MD  benazepril (LOTENSIN) 5 MG tablet Take 1 tablet (5 mg total) by mouth daily. 01/19/18  Yes Crissman, Jeannette How, MD  cyanocobalamin (,VITAMIN B-12,) 1000  MCG/ML injection INJECT 1 ML INTO THE MUSCLE EVERY 30 DAYS 12/16/17  Yes Crissman, Jeannette How, MD  dapagliflozin propanediol (FARXIGA) 10 MG TABS tablet Take 10 mg by mouth daily. 01/12/18  Yes Guadalupe Maple, MD  DIGOX 125 MCG tablet TAKE ONE TABLET EVERY DAY 06/19/17  Yes Gollan, Kathlene November, MD  acetaminophen (TYLENOL) 500 MG tablet Take 1,000 mg by mouth every 8 (eight) hours as needed.     [provider]  Calcium Citrate-Vitamin D (CALCIUM + D PO) Take 1 tablet by mouth 2 (two) times daily.     [provider]  carbidopa-levodopa (SINEMET) 25-100 MG tablet Take 100 mg by mouth 4 (four) times  daily.     [provider]  diltiazem (CARDIZEM) 30 MG tablet Take 1 tablet (30 mg total) by mouth 3 (three) times daily as needed. 05/06/17   [name], MD  flecainide (TAMBOCOR) 50 MG tablet TAKE 1 TABLET BY MOUTH TWICE DAILY 12/18/16   [name], MD  glipiZIDE-metformin (METAGLIP) 5-500 MG tablet Take 1 tablet by mouth 2 (two) times daily. 10/06/17   Johnson, Megan P, DO  GLUCOSAMINE-CHONDROITIN-VIT D3 PO Take by mouth daily.    [provider]  metoprolol succinate (TOPROL-XL) 50 MG 24 hr tablet Take 1 tablet (50 mg total) by mouth daily. Take with or immediately following a meal. 12/14/17   Crissman, Jeannette How, MD  mometasone (ELOCON) 0.1 % cream Apply 1 application topically daily. 01/15/18   Johnn Hai, PA-C  Multiple Vitamins-Minerals (CEROVITE SENIOR PO) Take 1 tablet by mouth daily.    [provider]  pantoprazole (PROTONIX) 40 MG tablet Take 1 tablet (40 mg total) by mouth 2 (two) times daily. 12/14/17   Guadalupe Maple, MD  Probiotic Product ([place]) Take by mouth.    [provider]  rosuvastatin (CRESTOR) 10 MG tablet Take 1 tablet (10 mg total) by mouth daily. 12/14/17   Guadalupe Maple, MD  sitaGLIPtin (JANUVIA) 100 MG tablet Take 1 tablet (100 mg total) by mouth daily. 12/14/17   Guadalupe Maple, MD  torsemide (DEMADEX) 20 MG tablet Take 1 tablet (20 mg total) by mouth daily as needed. 04/09/17   [name], MD  traMADol (ULTRAM) 50 MG tablet Take 1 tablet (50 mg total) by mouth every 8 (eight) hours as needed. 01/15/18   Johnn Hai, PA-C  triamcinolone cream (KENALOG) 0.1 % Apply 1 application topically 2 (two) times daily. 12/15/17   Guadalupe Maple, MD      VITAL SIGNS:  Blood pressure 114/60, pulse (!) 56, temperature 98 F (36.7 C), resp. rate 17, height 5\' 10"  (1.778 m), weight 173 lb (78.5 kg), SpO2 93 %.  PHYSICAL EXAMINATION:  Physical Exam  GENERAL:  84 y.o.-year-old patient lying in  the bed with no acute distress.  EYES: Pupils equal, round, reactive to light and accommodation. No scleral icterus. Extraocular muscles intact.  HEENT: Head atraumatic, normocephalic. Oropharynx and nasopharynx clear.  NECK:  Supple, no jugular venous distention. No thyroid enlargement, no tenderness.  LUNGS: Normal breath sounds bilaterally, no wheezing, rales,rhonchi or crepitation. No use of accessory muscles of respiration.  CARDIOVASCULAR: S1, S2 normal. 2/6 systolic murmurs, no rubs, or gallops.  ABDOMEN: Soft, nontender, nondistended. Bowel sounds present. No organomegaly or mass.  EXTREMITIES: No pedal edema, cyanosis, or clubbing.  NEUROLOGIC: Cranial nerves II through XII are intact. Muscle strength 5/5 in all extremities. Sensation intact. Gait not checked.  PSYCHIATRIC: The patient is alert and oriented x 3.  SKIN: No obvious rash, lesion, or ulcer.   LABORATORY PANEL:   CBC Recent Labs  Lab 01/31/18 1352  WBC 8.1  HGB 10.9*  HCT 32.6*  PLT 177   ------------------------------------------------------------------------------------------------------------------  Chemistries  Recent Labs  Lab 01/31/18 1352  NA 135  K 4.0  CL 104  CO2 24  GLUCOSE 200*  BUN 19  CREATININE 1.37*  CALCIUM 8.5*  AST 26  ALT 6  ALKPHOS 40  BILITOT 0.5   ------------------------------------------------------------------------------------------------------------------  Cardiac Enzymes Recent Labs  Lab 01/31/18 1352  TROPONINI <0.03   ------------------------------------------------------------------------------------------------------------------  RADIOLOGY:  Ct Abdomen Pelvis Wo Contrast  Result Date: 01/31/2018 CLINICAL DATA:  Left lower quadrant abdominopelvic pain, worsening for 1 week. History of cholecystectomy, appendectomy and hernia surgery. EXAM: CT ABDOMEN AND PELVIS WITHOUT CONTRAST TECHNIQUE: Multidetector CT imaging of the abdomen and pelvis was performed  following the standard protocol without IV contrast. COMPARISON:  09/21/2016 CT abdomen/pelvis. FINDINGS: Lower chest: No significant pulmonary nodules or acute consolidative airspace disease. Coronary atherosclerosis. Nonspecific patchy confluent subpleural reticulation at both lung bases. Hepatobiliary: Normal liver size. No liver mass. Cholecystectomy. No biliary ductal dilatation. Pancreas: Heterogeneous fatty infiltration of the pancreas without discrete pancreatic mass or duct dilation. A few scattered punctate calcifications throughout the pancreas could be vascular or could be indicative of chronic pancreatitis, and are unchanged. Spleen: Normal size. No mass. Adrenals/Urinary Tract: Normal adrenals. No right renal stones. Curvilinear 8 mm calcification in the lower left renal sinus (series 2/image 33) is stable and could represent layering calcific material within a small caliceal diverticulum versus vascular calcification versus nonobstructing stone. No hydronephrosis. Stable simple small exophytic 1.0 cm renal cyst in the interpolar left kidney. Otherwise no contour deforming renal lesions. Normal bladder. Stomach/Bowel: Normal non-distended stomach. Normal caliber small bowel with no small bowel wall thickening. Appendectomy. Mild sigmoid diverticulosis, with no large bowel wall thickening or significant pericolonic fat stranding. Vascular/Lymphatic: Atherosclerotic abdominal aorta with stable ectatic 2.7 cm infrarenal abdominal aorta. No pathologically enlarged lymph nodes in the abdomen or pelvis. Reproductive: Stable mild prostatomegaly. Nonspecific scattered internal prostatic calcifications. Other: No pneumoperitoneum, ascites or focal fluid collection. Stable tiny fat containing umbilical hernia. No evidence of a recurrent right inguinal hernia status post surgical repair. Musculoskeletal: No aggressive appearing focal osseous lesions. Status post bilateral posterior spinal fusion L3-L5. Moderate  thoracolumbar spondylosis. IMPRESSION: 1. No acute abnormality. No evidence of bowel obstruction or acute bowel inflammation. Mild sigmoid diverticulosis, with no evidence of acute diverticulitis. 2. Stable ectatic 2.7 cm infrarenal abdominal aorta, at risk for aneurysm development. Recommend follow-up aortic ultrasound in 5 years. This recommendation follows ACR consensus guidelines: White Paper of the ACR Incidental Findings Committee II on Vascular Findings. J Am Coll Radiol 2013; 78:938-101. 3.  Aortic Atherosclerosis (ICD10-I70.0).  Coronary atherosclerosis. 4. Stable mild prostatomegaly. 5. Stable curvilinear calcification in the lower left renal sinus, differential includes layering calcification within a small calyceal diverticulum versus nonobstructing stone versus vascular calcification. No hydronephrosis. Electronically Signed   By: Ilona Sorrel M.D.   On: 01/31/2018 15:48   Dg Chest 2 View  Result Date: 01/31/2018 CLINICAL DATA:  Near syncopal episode EXAM: CHEST - 2 VIEW COMPARISON:  09/08/2014 FINDINGS: The heart size and mediastinal contours are within normal limits. Both lungs are clear. The visualized skeletal structures are unremarkable. IMPRESSION: No active cardiopulmonary disease. Electronically Signed   By: Inez Catalina M.D.   On: 01/31/2018 14:40  IMPRESSION AND PLAN:   Near syncope, unclear etiology. The patient will be placed for observation.  Telemetry monitor, orthostatic vital signs, cardiology consult.  Bradycardia.  Hold Lopressor and digoxin, check digoxin level.  History of paroxysmal A. fib.  Continue Eliquis, hold Lopressor and digoxin due to bradycardia.  History of chronic diastolic CHF.  Stable.  Hold torsemide due to elevated creatinine. Hypertension.  Hold Lopressor and torsemide due to above. Diabetes.  Start sliding scale and hold glipizide.  All the records are reviewed and case discussed with ED provider. Management plans discussed with the  patient, his daughter and they are in agreement.   CODE STATUS: Full code  TOTAL TIME TAKING CARE OF THIS PATIENT: 43 minutes.    Demetrios Loll M.D on 01/31/2018 at 4:45 PM  Between 7am to 6pm - Pager - [phone]  After 6pm go to [email or web address] - Proofreader  [facility]  Office  [phone]  CC: Primary care physician; Guadalupe Maple, MD   Note: This dictation was prepared with Dragon dictation along with smaller phrase technology. Any transcriptional errors that result from this process are unin

## 2018-01-31 NOTE — ED Notes (Signed)
Report to [facility]

## 2018-01-31 NOTE — ED Provider Notes (Signed)
[facility] Emergency Department Provider Note   ____________________________________________   First MD Initiated Contact with Patient 01/31/18 1353     (approximate)  I have reviewed the triage vital signs and the nursing notes.   HISTORY  Chief Complaint Near Syncope    HPI Timothy [name] is a 84 y.o. male who was sitting in church got very sweaty and then passed out or almost passed out.  He reported everything went black around him.  Did not have any chest pain or shortness of breath.  He had one prior episode of syncope when he went to the bathroom.  That was several months ago.  Patient also has had 3 weeks of left lower quadrant pain is getting better and worse but never really goes away moderate in nature now worse with percussion.  He ate breakfast at 8:00 this morning.  Did not have any trouble with that.  Glucose here today was 200.   Past Medical History:  Diagnosis Date  . Back pain   . CHF (congestive heart failure) ([place])   . Esophageal reflux   . 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 Senile osteoporosis   . Skin cancer   . TIA (transient ischemic attack)     Patient Active Problem List   Diagnosis Date Noted  . Dysphagia 12/10/2017  . Advanced care planning/counseling discussion 11/27/2016  . Pernicious anemia 05/28/2015  . Leg weakness, bilateral 04/12/2015  . Tremor 03/27/2015  . Chronic anxiety 02/20/2015  . SVT (supraventricular tachycardia) ([place]) 02/16/2015  . Paroxysmal atrial fibrillation ([place]) 02/16/2015  . History of back surgery 02/16/2015  . AAA (abdominal aortic aneurysm) without rupture ([place]) 10/07/2012  . TIA (transient ischemic attack) 12/29/2011  . HLD (hyperlipidemia) 09/24/2011  . Essential hypertension 09/24/2011  . DM type 2 (diabetes mellitus, type 2) ([place]) 09/24/2011    Past Surgical History:  Procedure Laterality Date  . APPENDECTOMY    . BACK SURGERY    . CATARACT  EXTRACTION    . COLONOSCOPY  [id number]  . ESOPHAGEAL DILATION    . GALLBLADDER SURGERY    . HERNIA REPAIR    . RETINAL DETACHMENT SURGERY    . TONSILLECTOMY      Prior to Admission medications   Medication Sig Start Date End Date Taking? Authorizing Provider  acetaminophen (TYLENOL) 500 MG tablet Take 1,000 mg by mouth every 8 (eight) hours as needed.     [provider]  apixaban (ELIQUIS) 5 MG TABS tablet Take 1 tablet (5 mg total) by mouth 2 (two) times daily. 08/19/17   Guadalupe Maple, MD  benazepril (LOTENSIN) 5 MG tablet Take 1 tablet (5 mg total) by mouth daily. 01/19/18   Guadalupe Maple, MD  Calcium Citrate-Vitamin D (CALCIUM + D PO) Take 1 tablet by mouth 2 (two) times daily.     [provider]  carbidopa-levodopa (SINEMET) 25-100 MG tablet Take 100 mg by mouth 4 (four) times daily.     [provider]  cyanocobalamin (,VITAMIN B-12,) 1000 MCG/ML injection INJECT 1 ML INTO THE MUSCLE EVERY 30 DAYS 12/16/17   Guadalupe Maple, MD  dapagliflozin propanediol (FARXIGA) 10 MG TABS tablet Take 10 mg by mouth daily. 01/12/18   Guadalupe Maple, MD  DIGOX 125 MCG tablet TAKE ONE TABLET EVERY DAY 06/19/17   [name], MD  diltiazem (CARDIZEM) 30 MG tablet Take 1 tablet (30 mg total) by mouth 3 (three)  times daily as needed. 05/06/17   [name], MD  flecainide (TAMBOCOR) 50 MG tablet TAKE 1 TABLET BY MOUTH TWICE DAILY 12/18/16   [name], MD  flecainide (TAMBOCOR) 50 MG tablet TAKE ONE TABLET TWICE DAILY 12/07/17   [name], MD  glipiZIDE-metformin (METAGLIP) 5-500 MG tablet Take 1 tablet by mouth 2 (two) times daily. 10/06/17   Johnson, Megan P, DO  GLUCOSAMINE-CHONDROITIN-VIT D3 PO Take by mouth daily.    [provider]  metoprolol succinate (TOPROL-XL) 50 MG 24 hr tablet Take 1 tablet (50 mg total) by mouth daily. Take with or immediately following a meal. 12/14/17   Crissman, Jeannette How, MD  metoprolol succinate (TOPROL-XL) 50  MG 24 hr tablet TAKE ONE TABLET BY MOUTH EVERY DAY 01/28/18   Guadalupe Maple, MD  mometasone (ELOCON) 0.1 % cream Apply 1 application topically daily. 01/15/18   Johnn Hai, PA-C  Multiple Vitamins-Minerals (CEROVITE SENIOR PO) Take 1 tablet by mouth daily.    [provider]  pantoprazole (PROTONIX) 40 MG tablet Take 1 tablet (40 mg total) by mouth 2 (two) times daily. 12/14/17   Guadalupe Maple, MD  Probiotic Product ([place]) Take by mouth.    [provider]  rosuvastatin (CRESTOR) 10 MG tablet Take 1 tablet (10 mg total) by mouth daily. 12/14/17   Guadalupe Maple, MD  rosuvastatin (CRESTOR) 10 MG tablet TAKE ONE TABLET BY MOUTH EVERY DAY 01/08/18   Guadalupe Maple, MD  sitaGLIPtin (JANUVIA) 100 MG tablet Take 1 tablet (100 mg total) by mouth daily. 12/14/17   Guadalupe Maple, MD  torsemide (DEMADEX) 20 MG tablet Take 1 tablet (20 mg total) by mouth daily as needed. 04/09/17   [name], MD  traMADol (ULTRAM) 50 MG tablet Take 1 tablet (50 mg total) by mouth every 8 (eight) hours as needed. 01/15/18   Johnn Hai, PA-C  triamcinolone cream (KENALOG) 0.1 % Apply 1 application topically 2 (two) times daily. 12/15/17   Guadalupe Maple, MD    Allergies Patient has no known allergies.  Family History  Problem Relation Age of Onset  . Heart disease Mother   . Diabetes Unknown        grandparents  . Heart disease Brother   . Stroke Brother     Social History Social History   Tobacco Use  . Smoking status: Never Smoker  . Smokeless tobacco: Never Used  Substance Use Topics  . Alcohol use: No  . Drug use: No    Review of Systems  Constitutional: No fever/chills Eyes: No visual changes. ENT: No sore throat. Cardiovascular: Denies chest pain. Respiratory: Denies shortness of breath. Gastrointestinal:  abdominal pain.  No nausea, no vomiting.  No diarrhea.  No constipation. Genitourinary: Negative for dysuria. Musculoskeletal:  Negative for back pain. Skin: Negative for rash. Neurological: Negative for headaches, focal weakness   ____________________________________________   PHYSICAL EXAM:  VITAL SIGNS: ED Triage Vitals  Enc Vitals Group     BP 01/31/18 1343 119/62     Pulse Rate 01/31/18 1343 (!) 58     Resp 01/31/18 1343 16     Temp 01/31/18 1343 98 F (36.7 C)     Temp src --      SpO2 01/31/18 1343 98 %     Weight 01/31/18 1344 173 lb (78.5 kg)     Height 01/31/18 1344 5\' 10"  (1.778 m)     Head Circumference --  Peak Flow --      Pain Score 01/31/18 1344 0     Pain Loc --      Pain Edu? --      Excl. in [place]? --     Constitutional: Alert and oriented. Well appearing and in no acute distress. Eyes: Conjunctivae are normal.  Head: Atraumatic. Nose: No congestion/rhinnorhea. Mouth/Throat: Mucous membranes are moist.  Oropharynx non-erythematous. Neck: No stridor.  Cardiovascular: Normal rate, regular rhythm. Grossly normal heart sounds.  Good peripheral circulation. Respiratory: Normal respiratory effort.  No retractions. Lungs CTAB. Gastrointestinal: Soft tender to palpation percussion in the left lower quadrant. No distention. No abdominal bruits. No CVA tenderness. Musculoskeletal: No lower extremity tenderness nor edema.  No joint effusions. Neurologic:  Normal speech and language. No gross focal neurologic deficits are appreciated. No gait instability. Skin:  Skin is warm, dry and intact. No rash noted. Psychiatric: Mood and affect are normal. Speech and behavior are normal.  ____________________________________________   LABS (all labs ordered are listed, but only abnormal results are displayed)  Labs Reviewed  URINALYSIS, COMPLETE (UACMP) WITH MICROSCOPIC - Abnormal; Notable for the following components:      Result Value   Color, Urine YELLOW (*)    APPearance CLEAR (*)    Glucose, UA >=500 (*)    All other components within normal limits  COMPREHENSIVE METABOLIC PANEL -  Abnormal; Notable for the following components:   Glucose, Bld 200 (*)    Creatinine, Ser 1.37 (*)    Calcium 8.5 (*)    GFR calc non Af Amer 45 (*)    GFR calc Af Amer 52 (*)    All other components within normal limits  CBC WITH DIFFERENTIAL/PLATELET - Abnormal; Notable for the following components:   RBC 3.53 (*)    Hemoglobin 10.9 (*)    HCT 32.6 (*)    RDW 14.9 (*)    All other components within normal limits  TROPONIN I  CBG MONITORING, ED   ____________________________________________  EKG  EKG read and interpreted by me shows sinus bradycardia rate of 58 left axis decreased R wave progression no acute ST-T wave changes ____________________________________________  RADIOLOGY  ED MD interpretation: Chest x-ray read by radiology reviewed by me shows no acute disease abdominal film showed no explanation for his left lower quadrant pain that I can see.  The radiologist read the CT  Official radiology report(s): Ct Abdomen Pelvis Wo Contrast  Result Date: 01/31/2018 CLINICAL DATA:  Left lower quadrant abdominopelvic pain, worsening for 1 week. History of cholecystectomy, appendectomy and hernia surgery. EXAM: CT ABDOMEN AND PELVIS WITHOUT CONTRAST TECHNIQUE: Multidetector CT imaging of the abdomen and pelvis was performed following the standard protocol without IV contrast. COMPARISON:  09/21/2016 CT abdomen/pelvis. FINDINGS: Lower chest: No significant pulmonary nodules or acute consolidative airspace disease. Coronary atherosclerosis. Nonspecific patchy confluent subpleural reticulation at both lung bases. Hepatobiliary: Normal liver size. No liver mass. Cholecystectomy. No biliary ductal dilatation. Pancreas: Heterogeneous fatty infiltration of the pancreas without discrete pancreatic mass or duct dilation. A few scattered punctate calcifications throughout the pancreas could be vascular or could be indicative of chronic pancreatitis, and are unchanged. Spleen: Normal size. No  mass. Adrenals/Urinary Tract: Normal adrenals. No right renal stones. Curvilinear 8 mm calcification in the lower left renal sinus (series 2/image 33) is stable and could represent layering calcific material within a small caliceal diverticulum versus vascular calcification versus nonobstructing stone. No hydronephrosis. Stable simple small exophytic 1.0 cm renal cyst in the interpolar  left kidney. Otherwise no contour deforming renal lesions. Normal bladder. Stomach/Bowel: Normal non-distended stomach. Normal caliber small bowel with no small bowel wall thickening. Appendectomy. Mild sigmoid diverticulosis, with no large bowel wall thickening or significant pericolonic fat stranding. Vascular/Lymphatic: Atherosclerotic abdominal aorta with stable ectatic 2.7 cm infrarenal abdominal aorta. No pathologically enlarged lymph nodes in the abdomen or pelvis. Reproductive: Stable mild prostatomegaly. Nonspecific scattered internal prostatic calcifications. Other: No pneumoperitoneum, ascites or focal fluid collection. Stable tiny fat containing umbilical hernia. No evidence of a recurrent right inguinal hernia status post surgical repair. Musculoskeletal: No aggressive appearing focal osseous lesions. Status post bilateral posterior spinal fusion L3-L5. Moderate thoracolumbar spondylosis. IMPRESSION: 1. No acute abnormality. No evidence of bowel obstruction or acute bowel inflammation. Mild sigmoid diverticulosis, with no evidence of acute diverticulitis. 2. Stable ectatic 2.7 cm infrarenal abdominal aorta, at risk for aneurysm development. Recommend follow-up aortic ultrasound in 5 years. This recommendation follows ACR consensus guidelines: White Paper of the ACR Incidental Findings Committee II on Vascular Findings. J Am Coll Radiol 2013; 36:144-315. 3.  Aortic Atherosclerosis (ICD10-I70.0).  Coronary atherosclerosis. 4. Stable mild prostatomegaly. 5. Stable curvilinear calcification in the lower left renal sinus,  differential includes layering calcification within a small calyceal diverticulum versus nonobstructing stone versus vascular calcification. No hydronephrosis. Electronically Signed   By: Ilona Sorrel M.D.   On: 01/31/2018 15:48   Dg Chest 2 View  Result Date: 01/31/2018 CLINICAL DATA:  Near syncopal episode EXAM: CHEST - 2 VIEW COMPARISON:  09/08/2014 FINDINGS: The heart size and mediastinal contours are within normal limits. Both lungs are clear. The visualized skeletal structures are unremarkable. IMPRESSION: No active cardiopulmonary disease. Electronically Signed   By: Inez Catalina M.D.   On: 01/31/2018 14:40    ____________________________________________   PROCEDURES  Procedure(s) performed:   Procedures  Critical Care performed:  ____________________________________________   INITIAL IMPRESSION / ASSESSMENT AND PLAN / ED COURSE  Patient got pale and diaphoretic and almost passed out while sitting.  At his age I think the safest thing to do for him would be to put him in the hospital and evaluate him for arrhythmias and other causes of syncope.  I will begin this process.         ____________________________________________   FINAL CLINICAL IMPRESSION(S) / ED DIAGNOSES  Final diagnoses:  Near syncope     ED Discharge Orders    None       Note:  This document was prepared using Dragon voice recognition software and may include unintentional dictation errors.    Nena Polio, MD 01/31/18 1622

## 2018-01-31 NOTE — Plan of Care (Signed)

## 2018-01-31 NOTE — ED Triage Notes (Signed)
Pt was at church and states he felt like he was going to pass out. Was pale and diaphoretic. Felt that way for about 30 mins but not better.

## 2018-02-01 ENCOUNTER — Observation Stay (HOSPITAL_BASED_OUTPATIENT_CLINIC_OR_DEPARTMENT_OTHER)
Admit: 2018-02-01 | Discharge: 2018-02-01 | Disposition: A | Discharge status: Home / Self Care | Payer: Medicare Other | Attending: Internal Medicine | Admitting: Internal Medicine

## 2018-02-01 ENCOUNTER — Encounter: Payer: Self-pay | Admitting: *Deleted

## 2018-02-01 DIAGNOSIS — I5032 Chronic diastolic (congestive) heart failure: Secondary | ICD-10-CM | POA: Diagnosis not present

## 2018-02-01 DIAGNOSIS — G20 Parkinson's disease: Secondary | ICD-10-CM | POA: Diagnosis not present

## 2018-02-01 DIAGNOSIS — R55 Syncope and collapse: Secondary | ICD-10-CM

## 2018-02-01 DIAGNOSIS — I351 Nonrheumatic aortic (valve) insufficiency: Secondary | ICD-10-CM

## 2018-02-01 DIAGNOSIS — I480 Paroxysmal atrial fibrillation: Secondary | ICD-10-CM

## 2018-02-01 DIAGNOSIS — R001 Bradycardia, unspecified: Secondary | ICD-10-CM | POA: Diagnosis not present

## 2018-02-01 DIAGNOSIS — I951 Orthostatic hypotension: Secondary | ICD-10-CM | POA: Diagnosis not present

## 2018-02-01 LAB — BASIC METABOLIC PANEL
Anion gap: 8 (ref 5–15)
BUN: 17 mg/dL (ref 8–23)
CO2: 24 mmol/L (ref 22–32)
Calcium: 8.4 mg/dL — ABNORMAL LOW (ref 8.9–10.3)
Chloride: 105 mmol/L (ref 98–111)
Creatinine, Ser: 1.14 mg/dL (ref 0.61–1.24)
GFR calc Af Amer: >60 mL/min (ref >60)
GFR calc non Af Amer: 56 mL/min — ABNORMAL LOW (ref >60)
Glucose, Bld: 174 mg/dL — ABNORMAL HIGH (ref 70–99)
Potassium: 4 mmol/L (ref 3.5–5.1)
Sodium: 137 mmol/L (ref 135–145)

## 2018-02-01 LAB — GLUCOSE, CAPILLARY
Glucose-Capillary: 194 mg/dL — ABNORMAL HIGH (ref 70–99)
Glucose-Capillary: 353 mg/dL — ABNORMAL HIGH (ref 70–99)
Glucose-Capillary: 176 mg/dL — ABNORMAL HIGH (ref 70–99)
Glucose-Capillary: 171 mg/dL — ABNORMAL HIGH (ref 70–99)

## 2018-02-01 LAB — ECHOCARDIOGRAM COMPLETE
Height: 69 in
Weight: 2772.8 oz

## 2018-02-01 LAB — CBC
HCT: 32.7 % — ABNORMAL LOW (ref 40.0–52.0)
Hemoglobin: 11.2 g/dL — ABNORMAL LOW (ref 13.0–18.0)
MCH: 31.2 pg (ref 26.0–34.0)
MCHC: 34.1 g/dL (ref 32.0–36.0)
MCV: 91.5 fL (ref 80.0–100.0)
Platelets: 181 10*3/uL (ref 150–440)
RBC: 3.58 MIL/uL — ABNORMAL LOW (ref 4.40–5.90)
RDW: 14.5 % (ref 11.5–14.5)
WBC: 6.9 10*3/uL (ref 3.8–10.6)

## 2018-02-01 NOTE — Consult Note (Signed)
Cardiology Consult    Patient ID: Timothy [name] MRN: [id number], DOB/AGE: Sep 26, 1928   Admit date: 01/31/2018 Date of Consult: 02/01/2018  Primary Physician: Guadalupe Maple, MD Primary Cardiologist: No primary care provider on file. Requesting Provider: Dr. Bridgett Larsson  Patient Profile    Timothy [name] is a 84 y.o. male with h/o paroxysmal Afib and SVT on Eliquis and Flecainide, HFpEF (EF 55-60%, 2018) , AS (2018 mild to moderate), HTN, HLD DM2, AAA (3.5cm), b/l popliteal bypass 12/2012 and 05/2013 (1.7 cm aneurysm), back surgery 2016, esophageal reflux, and limited by Parkinsons but known pt of Dr. Rockey Situ who is being seen today for the evaluation of near syncope (with previous episode of near syncope) at the request of Dr. Bridgett Larsson.  Past Medical History   Past Medical History:  Diagnosis Date  . Back pain   . CHF (congestive heart failure) ([place])   . Esophageal reflux   . 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 Senile osteoporosis   . Skin cancer   . TIA (transient ischemic attack)     Past Surgical History:  Procedure Laterality Date  . APPENDECTOMY    . BACK SURGERY    . CATARACT EXTRACTION    . COLONOSCOPY  [id number]  . ESOPHAGEAL DILATION    . GALLBLADDER SURGERY    . HERNIA REPAIR    . RETINAL DETACHMENT SURGERY    . TONSILLECTOMY       Allergies  No Known Allergies  History of Present Illness    Patient is 84 yo man with h/o paroxysmal Afib and SVT on Eliquis and Flecainide, HFpEF (2018 EF 55-60%, AS (2018 mild-mod), HTN, HLD DM2, AAA (7/19, 3.5 cm), b/l popliteal bypass 12/2012 and 05/2013 (1.7 cm aneurysm), back surgery 2016, esophageal reflux, and limited by Parkinsons and a known patient to Dr. Rockey Situ with last office visit 04/09/2017. Other PMH includes shingles (8/18), h/o past fall with concussion, h/o nephrolithiasis (3/18), and osteitis deformans, h/o TIA, and h/o skin cancer.   10/18 EKG from office visit with Dr. Rockey Situ shows NSR,  60 bpm, poor r wave through anterior precordial leads, LAFB.   05/08/17 telephone encounter notes indicate pt daughter would be in touch if wanted to proceed with scheduled stress test that, per patient records, was not yet completed.   11/06/17 telephone encounter notes regarding pt daughter's call with report of pt with dyspnea, rated worse than usual. At that time, daughter indicated pt not taking daily weights or taking torsemide as prescribed. Per records, daughter was advised that pt resume torsemide as prescribed going forward.   01/31/18: Patient presented to [facility] ED with daughter in law after reported near syncope event accompanied by nausea and diaphoresis while sitting at church. In the ED, patient complained of diaphoresis, fatigue, nausea, and dizziness. Vitals: BP 114/60, HR 56, T 70F, RR 17. Labs significant for WBC 8.1, Hgb 10.9, HCT 32.6, Plt 177; Na 135, K 4.0, Glucose 200, Cr 1.37, BUN 19, Ca 8.5, AST 26, ALT 6. Troponin at that time was negative.  Patient reports that he cannot remember a chunk of time during this near syncope event, specifically the time in which his granddaughter and daughter in law reportedly switched places at church to take care of him. Patient reports that he was well hydrated before the event and had eaten a substantial breakfast that morning. He denies feeling any cardiac symptoms leading up to the event such as rapid heart rate,  palpitations, or chest pain. No current cardiac symptoms. In terms of his ongoing HFpEF, patient reports he is able to lie flat at night, denies early satiety, and does not reportedly feel winded after walking a couple of blocks with his walker.  Pt does not c/o near syncope symptoms or CP at this time. He denies any current weakness, HA, diaphoresis, n, v, bowel or urination issues. He does report LLQ pain, which he attributes to a pulled muscle. Of note, 09/21/2016 flank pain with 3.2 and 79mm stones but recent imaging does not show  evidence of repeat renal stone.   Inpatient Medications    . apixaban  5 mg Oral BID  . carbidopa-levodopa  1 tablet Oral QID  . flecainide  50 mg Oral BID  . insulin aspart  0-5 Units Subcutaneous QHS  . insulin aspart  0-9 Units Subcutaneous TID WC  . pantoprazole  40 mg Oral BID  . rosuvastatin  10 mg Oral QPM  . sodium chloride flush  3 mL Intravenous Q12H    Family History    Family History  Problem Relation Age of Onset  . Heart disease Mother   . Diabetes Unknown        grandparents  . Heart disease Brother   . Stroke Brother    indicated that his mother is deceased. He indicated that his father is deceased. He indicated that the status of his unknown relative is unknown.   Social History    Social History   Socioeconomic History  . Marital status: Widowed    Spouse name: Not on file  . Number of children: 5  . Years of education: Not on file  . Highest education level: Not on file  Occupational History  . Not on file  Social Needs  . Financial resource strain: Not on file  . Food insecurity:    Worry: Not on file    Inability: Not on file  . Transportation needs:    Medical: Not on file    Non-medical: Not on file  Tobacco Use  . Smoking status: Never Smoker  . Smokeless tobacco: Never Used  Substance and Sexual Activity  . Alcohol use: No  . Drug use: No  . Sexual activity: Never  Lifestyle  . Physical activity:    Days per week: Not on file    Minutes per session: Not on file  . Stress: Not on file  Relationships  . Social connections:    Talks on phone: Not on file    Gets together: Not on file    Attends religious service: Not on file    Active member of club or organization: Not on file    Attends meetings of clubs or organizations: Not on file    Relationship status: Not on file  . Intimate partner violence:    Fear of current or ex partner: Not on file    Emotionally abused: Not on file    Physically abused: Not on file    Forced  sexual activity: Not on file  Other Topics Concern  . Not on file  Social History Narrative  . Not on file     Review of Systems    General:  No chills, fever, night sweats or weight changes.  Cardiovascular:  No chest pain, dyspnea on exertion, edema, orthopnea, palpitations, paroxysmal nocturnal dyspnea. Dermatological: No rash, lesions/masses Respiratory: No cough, dyspnea Urologic: No hematuria, dysuria Abdominal:   No nausea, vomiting, diarrhea, bright red blood per rectum,  melena, or hematemesis. +++ LLQ pain attributed to a pulled muscle or flatus  Neurologic:  No visual changes, wkns, changes in mental status.(known Parkinson's Disease) All other systems reviewed and are otherwise negative except as noted above.  Physical Exam    Blood pressure 132/67, pulse (!) 56, temperature 97.7 F (36.5 C), temperature source Oral, resp. rate 16, height 5\' 9"  (1.753 m), weight 173 lb 4.8 oz (78.6 kg), SpO2 97 %.  General: Pleasant, NAD. States he feels at baseline. Psych: Normal affect. Neuro: Alert and oriented X 3. Moves all extremities spontaneously. HEENT: Normal  Neck: Supple without bruits or JVD. Lungs:  Resp regular and unlabored, CTA. Heart: RRR no s3, s4. AS noted on exam/ Abdomen: Soft, +++tender LLQ, non-distended, BS + x 4.  Extremities: No clubbing, cyanosis or edema. DP/PT/Radials 2+ and equal bilaterally.  Labs    Troponin (Point of Care Test) No results for input(s): TROPIPOC in the last 72 hours. Recent Labs    01/31/18 1352  TROPONINI <0.03   Lab Results  Component Value Date   WBC 6.9 02/01/2018   HGB 11.2 (L) 02/01/2018   HCT 32.7 (L) 02/01/2018   MCV 91.5 02/01/2018   PLT 181 02/01/2018    Recent Labs  Lab 01/31/18 1352 02/01/18 0426  NA 135 137  K 4.0 4.0  CL 104 105  CO2 24 24  BUN 19 17  CREATININE 1.37* 1.14  CALCIUM 8.5* 8.4*  PROT 7.0  --   BILITOT 0.5  --   ALKPHOS 40  --   ALT 6  --   AST 26  --   GLUCOSE 200* 174*   Lab  Results  Component Value Date   CHOL 116 12/10/2017   HDL 32 (L) 12/10/2017   LDLCALC 21 12/10/2017   TRIG 317 (H) 12/10/2017   No results found for: [facility]   Radiology Studies    Ct Abdomen Pelvis Wo Contrast  Result Date: 01/31/2018 CLINICAL DATA:  Left lower quadrant abdominopelvic pain, worsening for 1 week. History of cholecystectomy, appendectomy and hernia surgery. EXAM: CT ABDOMEN AND PELVIS WITHOUT CONTRAST TECHNIQUE: Multidetector CT imaging of the abdomen and pelvis was performed following the standard protocol without IV contrast. COMPARISON:  09/21/2016 CT abdomen/pelvis. FINDINGS: Lower chest: No significant pulmonary nodules or acute consolidative airspace disease. Coronary atherosclerosis. Nonspecific patchy confluent subpleural reticulation at both lung bases. Hepatobiliary: Normal liver size. No liver mass. Cholecystectomy. No biliary ductal dilatation. Pancreas: Heterogeneous fatty infiltration of the pancreas without discrete pancreatic mass or duct dilation. A few scattered punctate calcifications throughout the pancreas could be vascular or could be indicative of chronic pancreatitis, and are unchanged. Spleen: Normal size. No mass. Adrenals/Urinary Tract: Normal adrenals. No right renal stones. Curvilinear 8 mm calcification in the lower left renal sinus (series 2/image 33) is stable and could represent layering calcific material within a small caliceal diverticulum versus vascular calcification versus nonobstructing stone. No hydronephrosis. Stable simple small exophytic 1.0 cm renal cyst in the interpolar left kidney. Otherwise no contour deforming renal lesions. Normal bladder. Stomach/Bowel: Normal non-distended stomach. Normal caliber small bowel with no small bowel wall thickening. Appendectomy. Mild sigmoid diverticulosis, with no large bowel wall thickening or significant pericolonic fat stranding. Vascular/Lymphatic: Atherosclerotic abdominal aorta with stable ectatic 2.7  cm infrarenal abdominal aorta. No pathologically enlarged lymph nodes in the abdomen or pelvis. Reproductive: Stable mild prostatomegaly. Nonspecific scattered internal prostatic calcifications. Other: No pneumoperitoneum, ascites or focal fluid collection. Stable tiny fat containing umbilical hernia. No evidence  of a recurrent right inguinal hernia status post surgical repair. Musculoskeletal: No aggressive appearing focal osseous lesions. Status post bilateral posterior spinal fusion L3-L5. Moderate thoracolumbar spondylosis. IMPRESSION: 1. No acute abnormality. No evidence of bowel obstruction or acute bowel inflammation. Mild sigmoid diverticulosis, with no evidence of acute diverticulitis. 2. Stable ectatic 2.7 cm infrarenal abdominal aorta, at risk for aneurysm development. Recommend follow-up aortic ultrasound in 5 years. This recommendation follows ACR consensus guidelines: White Paper of the ACR Incidental Findings Committee II on Vascular Findings. J Am Coll Radiol 2013; 96:222-979. 3.  Aortic Atherosclerosis (ICD10-I70.0).  Coronary atherosclerosis. 4. Stable mild prostatomegaly. 5. Stable curvilinear calcification in the lower left renal sinus, differential includes layering calcification within a small calyceal diverticulum versus nonobstructing stone versus vascular calcification. No hydronephrosis. Electronically Signed   By: Ilona Sorrel M.D.   On: 01/31/2018 15:48   Dg Chest 2 View  Result Date: 01/31/2018 CLINICAL DATA:  Near syncopal episode EXAM: CHEST - 2 VIEW COMPARISON:  09/08/2014 FINDINGS: The heart size and mediastinal contours are within normal limits. Both lungs are clear. The visualized skeletal structures are unremarkable. IMPRESSION: No active cardiopulmonary disease. Electronically Signed   By: Inez Catalina M.D.   On: 01/31/2018 14:40   Dg Lumbar Spine 2-3 Views  Result Date: 01/15/2018 CLINICAL DATA:  Fall.  Back pain. EXAM: LUMBAR SPINE - 2-3 VIEW COMPARISON:  CT abdomen  pelvis 09/21/2016, lumbar spine x-rays 07/26/2013 FINDINGS: Pedicle screw and interbody fusion L3-4 L4-5. Hardware in good position. Disc degeneration and mild retrolisthesis L1-2 and L2-3. Mild anterolisthesis L4-5. Negative for fracture IMPRESSION: Negative for fracture.  PLIF L3-4 and L4-5 Electronically Signed   By: Franchot Gallo M.D.   On: 01/15/2018 15:36   [place] Aaa Duplex Limited  Result Date: 01/15/2018 CLINICAL DATA:  History of abdominal aortic aneurysm. Hypertension. Stroke. Syncope. Diabetes. Bruit. Hyperlipidemia. Appropriate age. EXAM: US ABDOMINAL AORTA MEDICARE SCREENING TECHNIQUE: Ultrasound examination of the abdominal aorta was performed as a screening evaluation for abdominal aortic aneurysm. COMPARISON:  09/21/2016 FINDINGS: Abdominal aortic measurements as follows: Proximal:  2.1 cm Mid:  2.1 cm Distal:  3.7 cm Maximal diameter on the prior study was 2.4 cm based on my measurements. Maximal right and left common iliac artery diameters are 1.3 and 1.6 cm respectively. IMPRESSION: Maximal diameter of the abdominal aorta is 3.7 cm. Recommend followup by ultrasound in 3 years. This recommendation follows ACR consensus guidelines: White Paper of the ACR Incidental Findings Committee II on Vascular Findings. Joellyn Rued Radiol 2013; [phone] Electronically Signed   By: Marybelle Killings M.D.   On: 01/15/2018 14:19    ECG & Cardiac Imaging    02/01/18  TTE - Pending results  01/15/18 [place] AAA Duplex Limited Maximal diameter of the abdominal aorta is 3.7 cm. Recommend followup by ultrasound in 3 years. This recommendation follows ACR consensus guidelines: White Paper of the ACR Incidental Findings Committee II on Vascular Findings. Joellyn Rued Radiol 2013; 74:081-448  04/24/2017  TTE -Left ventricle: The cavity size was normal. There was mild   concentric hypertrophy. Systolic function was normal. The   estimated ejection fraction was in the range of 55% to 60%. Wall   motion was normal;  there were no regional wall motion   abnormalities. Doppler parameters are consistent with abnormal   left ventricular relaxation (grade 1 diastolic dysfunction). - Aortic valve: Transvalvular velocity was increased. There was   moderate stenosis by mean gradient and peak velocity. There was   mild  regurgitation. - Mitral valve: Calcified annulus. - Left atrium: The atrium was normal in size. - Right ventricle: Systolic function was normal. - Pulmonary arteries: Systolic pressure was within the normal   range.   Assessment & Plan    1. Near Syncope - Pt with near syncope - diaphoresis and near LOC - while sitting in church. No CP or SOB. H/o one prior episode when using restroom several months prior. 3 wks LLQ pain. - Unclear etiology of near syncope at this time. H/o previous near syncope several months prior. Likely exacerbated with hypotension and bradycardia. No electrolyte imbalances. No indication of long sinus pauses. Ddx to include vasovagal event in setting of abdominal discomfort, dehydration, brady rates / hypotension, and anemia. Glucose 174. Renal function appears stable from previous labs. In setting of near syncope, home medications on hold include benazepril 5mg  po qd, torsemide 20mg , diltiazem 30mg  po TID, digoxin 125 mcg, toprol xl 50mg  po qd.  - Last stress 2016. Pt scheduled for additional stress test with no completed results in Epic. - Pending results of Echo - Telemetry-sinus brady and hypotensive with BP 104/59-149/73. - 01/31/18 EKG at admission shows sinus brady rate of 58, LAD, decreased R wave progression with no acute ST-T changes per ED note (not currently available thru media records).  - 01/31/18 CXR with no active cardiopulmonary disease  - 7/28 troponin negative. Continue to cycle troponin  - K+ 7/29 at goal 4.0, Ca 8.4. - Renal function slightly improved from 7/12 Cr 1.3  7/29 Cr 1.14; 7/12 BUN 22  BUN 17. Continue to hold diuresis in the setting of near  syncope sx. -Recommend continue to hold home medications listed above in the setting of near syncope sx and hypotension with rates bradycardic   2. Afib and SVT  - H/o Afib and SVT - Pending results of most recent echo - Rate and rhythm controlled - continue medical managment -CHA2DS2VASc of at least 8 (CHF, HTN, age x2, DM, TIA x2, AAA) - Continue Eliquis 5mg  PO BID - Continue Flecainide 50mg  PO BID - Rate currently 56-63 bpm per telemetry - Unlikely Afib is etiology of near syncope as rate and rhythm controlled and no indication of long sinus pause or resynchronization pause as likely etiology of near syncope sx  3. HFpEF - EF 55-60% per [facility] 04/2017. Aortic valve stenosis.    - Prominent AS murmur on exam. - Pending results of most recent echo -Continue Rosuvastatin 10mg  po qPM -In setting of near syncope, continue to hold home ACEi benazepril, torsemide, diltiazem, digoxin, toprol xl. - Continue Eliquis 5mg  po bid and flecainide 50mg  po bid for Afib as CHA2Ds2VASc of at least 8  4. Hypotensive with H/o Essential HTN - BP currently hypotensive with [id number] - In setting of near syncope, continue to hold home ACEi benazepril, torsemide, diltiazem, digoxin, toprol xl. - Continue gentle IVF    5. Anemia - RBC 3.58, Hgb 11.2  - Continue to monitor  6. HLD -Continue Rosuvastatin 10mg  po qPM  7. DM2 - Glucose 174 - H/o poor diet per pt history and Dr. Rockey Situ notes - Per IM  8. AAA without rupture - Imaging as as above.  - Recommendations with f/u US q 3y  9. PAD - B/l popliteal aneurysm, b/l bypass surgery at [facility]  10. Parkinson's disease - Per IM    For questions or updates, please contact [place] Please consult [email or web address] for contact info under Cardiology/STEMI.      Signed, Robin Searing  Retta Mac  Pager [phone] 02/01/2018, 11:04 AM

## 2018-02-01 NOTE — Progress Notes (Signed)
Inpatient Diabetes Program Recommendations  AACE/ADA: New Consensus Statement on Inpatient Glycemic Control (2015)  Target Ranges:  Prepandial:   less than 140 mg/dL      Peak postprandial:   less than 180 mg/dL (1-2 hours)      Critically ill patients:  140 - 180 mg/dL   Results for LISSANDRO, DILORENZO (MRN [id number]) as of 02/01/2018 12:51  Ref. Range 01/31/2018 20:49 02/01/2018 08:32 02/01/2018 12:27  Glucose-Capillary Latest Ref Range: 70 - 99 mg/dL 229 (H)  2 units NOVOLOG  194 (H)  3 units NOVOLOG  353 (H)    Admit with: Near Syncope  History: DM, CHF  Home DM Meds: Farxiga 10 mg daily       Metaglip 5/500 mg BID       Januvia 100 mg daily  Current Insulin Orders: Novolog Sensitive Correction Scale/ SSI (0-9 units) TID AC + HS      MD- Please consider the following in-hospital insulin adjustments while home oral DM meds are on hold:  1. Start low dose basal insulin: Lantus 8 units QHS (0.1 units/kg dosing)  2. Start low dose Novolog Meal Coverage: Novolog 3 units TID with meals (Please add the following Hold Parameters: Hold if pt eats <50% of meal, Hold if pt NPO)      --Will follow patient during hospitalization--  Wyn Quaker RN, MSN, CDE Diabetes Coordinator Inpatient Glycemic Control Team Team Pager: [phone] (8a-5p)

## 2018-02-01 NOTE — Care Management Note (Addendum)
Case Management Note  Patient Details  Name: Timothy [name] MRN: [id number] Date of Birth: 01/04/1929  Subjective/Objective:     Admitted to [facility] with the diagnosis of near syncope under observation status. Lives alone. Daughter is Glea [phone]). Last seen Dr. Jeananne Rama 12/10/17. Prescriptions are filled at [facility]. No home health. [facility] in [place] x 2 and [place] x 2. Caregivers in the home 3-4 hours everyday 7 days a week. Rolling walker in the home. Last fall was in June. Fair appetite.              Takes care of all basic activities of daily living himself, doesn't drive.  Action/Plan: Will continue to follow for discharge plans   Expected Discharge Date:  02/01/18               Expected Discharge Plan:     In-House Referral:     Discharge planning Services     Post Acute Care Choice:    Choice offered to:     DME Arranged:    DME Agency:     HH Arranged:    HH Agency:     Status of Service:     If discussed at [employer], dates discussed:    Additional Comments:  Shelbie Ammons, RN MSN CCM Care Management [phone] 02/01/2018, 2:03 PM

## 2018-02-01 NOTE — Care Management Obs Status (Signed)
[place] NOTIFICATION   Patient Details  Name: Timothy [name] MRN: [id number] Date of Birth: [birth year]   Medicare Observation Status Notification Given:  Yes    Shelbie Ammons, RN 02/01/2018, 9:12 AM

## 2018-02-01 NOTE — Progress Notes (Addendum)
[place] at [facility]                                                                                                                                                                                  Patient Demographics   Timothy [name], is a 84 y.o. male, DOB - 1928-10-05, WER:[id number]  Admit date - 01/31/2018   Admitting Physician Demetrios Loll, MD  Outpatient Primary MD for the patient is Guadalupe Maple, MD   LOS - 0  Subjective:  admitted with near syncope.  He was noted to have low borderline blood pressure and a low heart rate. Currently feeling better.  Heart rate is in the 50s and reviewing his records his heart rate usually is in the 50s.   Review of Systems:   CONSTITUTIONAL: No documented fever. No fatigue, weakness. No weight gain, no weight loss.  EYES: No blurry or double vision.  ENT: No tinnitus. No postnasal drip. No redness of the oropharynx.  RESPIRATORY: No cough, no wheeze, no hemoptysis. No dyspnea.  CARDIOVASCULAR: No chest pain. No orthopnea. No palpitations. No syncope.  GASTROINTESTINAL: No nausea, no vomiting or diarrhea. No abdominal pain. No melena or hematochezia.  GENITOURINARY: No dysuria or hematuria.  ENDOCRINE: No polyuria or nocturia. No heat or cold intolerance.  HEMATOLOGY: No anemia. No bruising. No bleeding.  INTEGUMENTARY: No rashes. No lesions.  MUSCULOSKELETAL: No arthritis. No swelling. No gout.  NEUROLOGIC: No numbness, tingling, or ataxia. No seizure-type activity.  PSYCHIATRIC: No anxiety. No insomnia. No ADD.    Vitals:   Vitals:   01/31/18 2010 02/01/18 0014 02/01/18 0457 02/01/18 0829  BP: 108/61  (!) 104/59 132/67  Pulse: 62  (!) 57 (!) 56  Resp: 18  16   Temp: 97.9 F (36.6 C)  97.9 F (36.6 C) 97.7 F (36.5 C)  TempSrc: Oral  Oral Oral  SpO2: 99%  97% 97%  Weight:  79 kg (174 lb 3.2 oz) 78.6 kg (173 lb 4.8 oz)   Height:        Wt Readings from Last 3 Encounters:  02/01/18 78.6 kg (173 lb  4.8 oz)  01/15/18 78.5 kg (173 lb)  09/29/17 79.4 kg (175 lb)     Intake/Output Summary (Last 24 hours) at 02/01/2018 1356 Last data filed at 02/01/2018 1013 Gross per 24 hour  Intake 355 ml  Output 1950 ml  Net -1595 ml    Physical Exam:   GENERAL: Pleasant-appearing in no apparent distress.  HEAD, EYES, EARS, NOSE AND THROAT: Atraumatic, normocephalic. Extraocular muscles are intact. Pupils equal and reactive to light. Sclerae anicteric. No conjunctival injection. No oro-pharyngeal erythema.  NECK: Supple. There  is no jugular venous distention. No bruits, no lymphadenopathy, no thyromegaly.  HEART: Regular rate and rhythm,.  Positive systolic murmurs, no rubs, no clicks.  LUNGS: Clear to auscultation bilaterally. No rales or rhonchi. No wheezes.  ABDOMEN: Soft, flat, nontender, nondistended. Has good bowel sounds. No hepatosplenomegaly appreciated.  EXTREMITIES: No evidence of any cyanosis, clubbing, or peripheral edema.  +2 pedal and radial pulses bilaterally.  NEUROLOGIC: The patient is alert, awake, and oriented x3 with no focal motor or sensory deficits appreciated bilaterally.  SKIN: Moist and warm with no rashes appreciated.  Psych: Not anxious, depressed LN: No inguinal LN enlargement    Antibiotics   Anti-infectives (From admission, onward)   None      Medications   Scheduled Meds: . apixaban  5 mg Oral BID  . carbidopa-levodopa  1 tablet Oral QID  . flecainide  50 mg Oral BID  . insulin aspart  0-5 Units Subcutaneous QHS  . insulin aspart  0-9 Units Subcutaneous TID WC  . pantoprazole  40 mg Oral BID  . rosuvastatin  10 mg Oral QPM  . sodium chloride flush  3 mL Intravenous Q12H   Continuous Infusions: . sodium chloride    . sodium chloride 50 mL/hr at 01/31/18 1830   PRN Meds:.sodium chloride, acetaminophen **OR** acetaminophen, albuterol, bisacodyl, ondansetron **OR** ondansetron (ZOFRAN) IV, senna-docusate, sodium chloride flush   Data Review:    Micro Results No results found for this or any previous visit (from the past 240 hour(s)).  Radiology Reports Ct Abdomen Pelvis Wo Contrast  Result Date: 01/31/2018 CLINICAL DATA:  Left lower quadrant abdominopelvic pain, worsening for 1 week. History of cholecystectomy, appendectomy and hernia surgery. EXAM: CT ABDOMEN AND PELVIS WITHOUT CONTRAST TECHNIQUE: Multidetector CT imaging of the abdomen and pelvis was performed following the standard protocol without IV contrast. COMPARISON:  09/21/2016 CT abdomen/pelvis. FINDINGS: Lower chest: No significant pulmonary nodules or acute consolidative airspace disease. Coronary atherosclerosis. Nonspecific patchy confluent subpleural reticulation at both lung bases. Hepatobiliary: Normal liver size. No liver mass. Cholecystectomy. No biliary ductal dilatation. Pancreas: Heterogeneous fatty infiltration of the pancreas without discrete pancreatic mass or duct dilation. A few scattered punctate calcifications throughout the pancreas could be vascular or could be indicative of chronic pancreatitis, and are unchanged. Spleen: Normal size. No mass. Adrenals/Urinary Tract: Normal adrenals. No right renal stones. Curvilinear 8 mm calcification in the lower left renal sinus (series 2/image 33) is stable and could represent layering calcific material within a small caliceal diverticulum versus vascular calcification versus nonobstructing stone. No hydronephrosis. Stable simple small exophytic 1.0 cm renal cyst in the interpolar left kidney. Otherwise no contour deforming renal lesions. Normal bladder. Stomach/Bowel: Normal non-distended stomach. Normal caliber small bowel with no small bowel wall thickening. Appendectomy. Mild sigmoid diverticulosis, with no large bowel wall thickening or significant pericolonic fat stranding. Vascular/Lymphatic: Atherosclerotic abdominal aorta with stable ectatic 2.7 cm infrarenal abdominal aorta. No pathologically enlarged lymph nodes in  the abdomen or pelvis. Reproductive: Stable mild prostatomegaly. Nonspecific scattered internal prostatic calcifications. Other: No pneumoperitoneum, ascites or focal fluid collection. Stable tiny fat containing umbilical hernia. No evidence of a recurrent right inguinal hernia status post surgical repair. Musculoskeletal: No aggressive appearing focal osseous lesions. Status post bilateral posterior spinal fusion L3-L5. Moderate thoracolumbar spondylosis. IMPRESSION: 1. No acute abnormality. No evidence of bowel obstruction or acute bowel inflammation. Mild sigmoid diverticulosis, with no evidence of acute diverticulitis. 2. Stable ectatic 2.7 cm infrarenal abdominal aorta, at risk for aneurysm development. Recommend follow-up aortic ultrasound  in 5 years. This recommendation follows ACR consensus guidelines: White Paper of the ACR Incidental Findings Committee II on Vascular Findings. J Am Coll Radiol 2013; 56:[id number]. 3.  Aortic Atherosclerosis (ICD10-I70.0).  Coronary atherosclerosis. 4. Stable mild prostatomegaly. 5. Stable curvilinear calcification in the lower left renal sinus, differential includes layering calcification within a small calyceal diverticulum versus nonobstructing stone versus vascular calcification. No hydronephrosis. Electronically Signed   By: Ilona Sorrel M.D.   On: 01/31/2018 15:48   Dg Chest 2 View  Result Date: 01/31/2018 CLINICAL DATA:  Near syncopal episode EXAM: CHEST - 2 VIEW COMPARISON:  09/08/2014 FINDINGS: The heart size and mediastinal contours are within normal limits. Both lungs are clear. The visualized skeletal structures are unremarkable. IMPRESSION: No active cardiopulmonary disease. Electronically Signed   By: Inez Catalina M.D.   On: 01/31/2018 14:40   Dg Lumbar Spine 2-3 Views  Result Date: 01/15/2018 CLINICAL DATA:  Fall.  Back pain. EXAM: LUMBAR SPINE - 2-3 VIEW COMPARISON:  CT abdomen pelvis 09/21/2016, lumbar spine x-rays 07/26/2013 FINDINGS: Pedicle screw  and interbody fusion L3-4 L4-5. Hardware in good position. Disc degeneration and mild retrolisthesis L1-2 and L2-3. Mild anterolisthesis L4-5. Negative for fracture IMPRESSION: Negative for fracture.  PLIF L3-4 and L4-5 Electronically Signed   By: Franchot Gallo M.D.   On: 01/15/2018 15:36   [place] Aaa Duplex Limited  Result Date: 01/15/2018 CLINICAL DATA:  History of abdominal aortic aneurysm. Hypertension. Stroke. Syncope. Diabetes. Bruit. Hyperlipidemia. Appropriate age. EXAM: US ABDOMINAL AORTA MEDICARE SCREENING TECHNIQUE: Ultrasound examination of the abdominal aorta was performed as a screening evaluation for abdominal aortic aneurysm. COMPARISON:  09/21/2016 FINDINGS: Abdominal aortic measurements as follows: Proximal:  2.1 cm Mid:  2.1 cm Distal:  3.7 cm Maximal diameter on the prior study was 2.4 cm based on my measurements. Maximal right and left common iliac artery diameters are 1.3 and 1.6 cm respectively. IMPRESSION: Maximal diameter of the abdominal aorta is 3.7 cm. Recommend followup by ultrasound in 3 years. This recommendation follows ACR consensus guidelines: White Paper of the ACR Incidental Findings Committee II on Vascular Findings. Joellyn Rued Radiol 2013; [phone] Electronically Signed   By: Marybelle Killings M.D.   On: 01/15/2018 14:19     CBC Recent Labs  Lab 01/31/18 1352 02/01/18 0426  WBC 8.1 6.9  HGB 10.9* 11.2*  HCT 32.6* 32.7*  PLT 177 181  MCV 92.5 91.5  MCH 30.8 31.2  MCHC 33.3 34.1  RDW 14.9* 14.5  LYMPHSABS 1.3  --   MONOABS 0.9  --   EOSABS 0.3  --   BASOSABS 0.1  --     Chemistries  Recent Labs  Lab 01/31/18 1352 02/01/18 0426  NA 135 137  K 4.0 4.0  CL 104 105  CO2 24 24  GLUCOSE 200* 174*  BUN 19 17  CREATININE 1.37* 1.14  CALCIUM 8.5* 8.4*  MG 2.4  --   AST 26  --   ALT 6  --   ALKPHOS 40  --   BILITOT 0.5  --    ------------------------------------------------------------------------------------------------------------------ estimated  creatinine clearance is 46.5 mL/min (by C-G formula based on SCr of 1.14 mg/dL). ------------------------------------------------------------------------------------------------------------------ No results for input(s): HGBA1C in the last 72 hours. ------------------------------------------------------------------------------------------------------------------ No results for input(s): CHOL, HDL, LDLCALC, TRIG, CHOLHDL, LDLDIRECT in the last 72 hours. ------------------------------------------------------------------------------------------------------------------ No results for input(s): TSH, T4TOTAL, T3FREE, THYROIDAB in the last 72 hours.  Invalid input(s): FREET3 ------------------------------------------------------------------------------------------------------------------ No results for input(s): VITAMINB12, FOLATE, FERRITIN, TIBC, IRON, RETICCTPCT in  the last 72 hours.  Coagulation profile No results for input(s): INR, PROTIME in the last 168 hours.  No results for input(s): DDIMER in the last 72 hours.  Cardiac Enzymes Recent Labs  Lab 01/31/18 1352  TROPONINI <0.03   ------------------------------------------------------------------------------------------------------------------ Invalid input(s): POCBNP    Assessment & Plan  Patient's 84 year old presented with near syncope  1.  Near syncope and setting of low borderline blood pressure, other differential diagnoses include vasovagal syncope cardiac arrhythmia Continue to hold antihypertensive Echo of the heart pending Appreciate cardiology input  2.  Bradycardia which appears to be chronic dig and diltiazem currently on hold  3.  History of atrial fibrillation continue digoxin and Cardizem, continue Eliquis  4. History of chronic diastolic CHF.  Stable.  Hold torsemide due to elevated creatinine.  5. Hypertension.  Hold Lopressor and torsemide due to above.  6. Diabetes.  Start sliding scale and hold  glipizide.  We will monitor patient today in the hospital likely discharge tomorrow      Code Status Orders  (From admission, onward)        Start     Ordered   01/31/18 1752  Full code  Continuous     01/31/18 1751    Code Status History    This patient has a current code status but no historical code status.    Advance Directive Documentation     Most Recent Value  Type of Advance Directive  Living will, Healthcare Power of Attorney  Pre-existing out of facility DNR order (yellow form or pink MOST form)  -  "MOST" Form in Place?  -           Consults none  DVT Prophylaxis Eliquis Lab Results  Component Value Date   PLT 181 02/01/2018     Time Spent in minutes 35 minutes  Greater than 50% of time spent in care coordination and counseling patient regarding the condition and plan of care.   Dustin Flock M.D on 02/01/2018 at 1:56 PM  Between 7am to 6pm - Pager - [phone]  After 6pm go to [email or web address] - Proofreader  [employer]   Office  [phone]

## 2018-02-01 NOTE — Progress Notes (Signed)
*  PRELIMINARY RESULTS* Echocardiogram 2D Echocardiogram has been performed.  Timothy [name] 02/01/2018, 2:55 PM

## 2018-02-02 ENCOUNTER — Telehealth: Payer: Self-pay | Admitting: Cardiovascular Disease

## 2018-02-02 DIAGNOSIS — R001 Bradycardia, unspecified: Secondary | ICD-10-CM | POA: Diagnosis not present

## 2018-02-02 DIAGNOSIS — I480 Paroxysmal atrial fibrillation: Secondary | ICD-10-CM | POA: Diagnosis not present

## 2018-02-02 DIAGNOSIS — I951 Orthostatic hypotension: Secondary | ICD-10-CM | POA: Diagnosis not present

## 2018-02-02 DIAGNOSIS — R55 Syncope and collapse: Secondary | ICD-10-CM | POA: Diagnosis not present

## 2018-02-02 DIAGNOSIS — I5032 Chronic diastolic (congestive) heart failure: Secondary | ICD-10-CM | POA: Diagnosis not present

## 2018-02-02 LAB — GLUCOSE, CAPILLARY
Glucose-Capillary: 190 mg/dL — ABNORMAL HIGH (ref 70–99)
Glucose-Capillary: 270 mg/dL — ABNORMAL HIGH (ref 70–99)
Glucose-Capillary: 236 mg/dL — ABNORMAL HIGH (ref 70–99)

## 2018-02-02 NOTE — Care Management (Signed)
Patient for discharge home today.  Agreeable to home health and agency preference is [facility].  Referral called and accepted- RN PT Aide

## 2018-02-02 NOTE — Care Management (Signed)
Informed that patient's neurologist was going to set patient  up with outpatient speech therapy due to some concerns regarding patient's parkinsons.  [facility] relayed SLP can perform evaluation in the home.  obtained order from attending

## 2018-02-02 NOTE — Progress Notes (Signed)
Timothy [name] to be D/C'd Home per MD order. Patient given discharge teaching and paperwork regarding medications, diet, follow-up appointments and activity. Patient understanding verbalized. No questions or complaints at this time. Skin condition as charted. IV and telemetry removed prior to leaving.  Home Health set up by Nann, CM. No further needs by Care Management/Social Work.   An After Visit Summary was printed and given to the patient.   Daughter providing transportation.  [name]

## 2018-02-02 NOTE — Plan of Care (Signed)

## 2018-02-02 NOTE — Telephone Encounter (Signed)
BP readings received dated from 12/30/17-01/31/18:   6/26- 163/84 6/27- no reading 6/28-151/77 6/29- 149/84 6/30- 153/80 7/1- 149/79 7/2- 128/77 7/3- 144/77 7/4- 153/72 7/5- no reading 7/6- 16?/95 7/7- 154/86 7/8- 158/82 7/9- no reading 7/10- 156/83 7/11- 137/74 7/12- 151/78 7/13-15- no readings 7/16- 129/68 7/17- 160/84 7/18- 155/87 7/19- 134/93  7/20- no reading 7/21- 166/87 7/22- 161/87 7/23- 152/87 7/24- 157/83 7/25- 143/87 7/26- 158/85 7/27- 162/79 7/28- 139/81  Dr. Rockey Situ & his nurse to review.

## 2018-02-02 NOTE — Telephone Encounter (Signed)
Patient daughter came by office and dropped off a copy of Daily Blood Glucose Test log, states patient is in hospital and are looking into changing blood pressure medication Placed in nurse box for review

## 2018-02-02 NOTE — Discharge Summary (Signed)
[facility], 84 y.o., DOB April 06, 1929, MRN [id number]. Admission date: 01/31/2018 Discharge Date 02/02/2018 Primary MD Guadalupe Maple, MD Admitting Physician Demetrios Loll, MD  Admission Diagnosis  Near syncope [R55]  Discharge Diagnosis   Active Problems:   Near syncope Bradycardia Relative hypotension with orthostasis Chronic atrial fibrillation Chronic diastolic CHF Essential hypertension Diabetes type 2 Moderate aortic stenosis  Hospital Course  Timothy [name]  is a 84 y.o. male with a known history of CHF, TIA, proximal A. fib, AAA, hypertension, hyperlipidemia, diabetes, osteoporosis and back pain.  Patient was in church when he started feeling dizzy almost passed out.  He became pale and diaphoretic.  Vitals in the chart which showed he was very bradycardic.  Blood pressure was borderline low.  Patient was also orthostatic in the ER.  He was admitted given IV fluids.  Monitor on telemetry.  He was noted to have some bradycardia.  His digoxin and Cardizem are currently discontinued.  He may need outpatient Holter monitor.  Currently he is asymptomatic and stable for discharge. Follow-up with cardiology             Consults  cardiology  Significant Tests:  See full reports for all details     Ct Abdomen Pelvis Wo Contrast  Result Date: 01/31/2018 CLINICAL DATA:  Left lower quadrant abdominopelvic pain, worsening for 1 week. History of cholecystectomy, appendectomy and hernia surgery. EXAM: CT ABDOMEN AND PELVIS WITHOUT CONTRAST TECHNIQUE: Multidetector CT imaging of the abdomen and pelvis was performed following the standard protocol without IV contrast. COMPARISON:  09/21/2016 CT abdomen/pelvis. FINDINGS: Lower chest: No significant pulmonary nodules or acute consolidative airspace disease. Coronary atherosclerosis. Nonspecific patchy confluent subpleural reticulation at both lung bases. Hepatobiliary: Normal liver size. No  liver mass. Cholecystectomy. No biliary ductal dilatation. Pancreas: Heterogeneous fatty infiltration of the pancreas without discrete pancreatic mass or duct dilation. A few scattered punctate calcifications throughout the pancreas could be vascular or could be indicative of chronic pancreatitis, and are unchanged. Spleen: Normal size. No mass. Adrenals/Urinary Tract: Normal adrenals. No right renal stones. Curvilinear 8 mm calcification in the lower left renal sinus (series 2/image 33) is stable and could represent layering calcific material within a small caliceal diverticulum versus vascular calcification versus nonobstructing stone. No hydronephrosis. Stable simple small exophytic 1.0 cm renal cyst in the interpolar left kidney. Otherwise no contour deforming renal lesions. Normal bladder. Stomach/Bowel: Normal non-distended stomach. Normal caliber small bowel with no small bowel wall thickening. Appendectomy. Mild sigmoid diverticulosis, with no large bowel wall thickening or significant pericolonic fat stranding. Vascular/Lymphatic: Atherosclerotic abdominal aorta with stable ectatic 2.7 cm infrarenal abdominal aorta. No pathologically enlarged lymph nodes in the abdomen or pelvis. Reproductive: Stable mild prostatomegaly. Nonspecific scattered internal prostatic calcifications. Other: No pneumoperitoneum, ascites or focal fluid collection. Stable tiny fat containing umbilical hernia. No evidence of a recurrent right inguinal hernia status post surgical repair. Musculoskeletal: No aggressive appearing focal osseous lesions. Status post bilateral posterior spinal fusion L3-L5. Moderate thoracolumbar spondylosis. IMPRESSION: 1. No acute abnormality. No evidence of bowel obstruction or acute bowel inflammation. Mild sigmoid diverticulosis, with no evidence of acute diverticulitis. 2. Stable ectatic 2.7 cm infrarenal abdominal aorta, at risk for aneurysm development. Recommend follow-up aortic ultrasound in 5  years. This recommendation follows ACR consensus guidelines: White Paper of the ACR Incidental Findings Committee II on Vascular Findings. J Am Coll Radiol 2013; 94:[id number]. 3.  Aortic Atherosclerosis (ICD10-I70.0).  Coronary atherosclerosis. 4. Stable mild  prostatomegaly. 5. Stable curvilinear calcification in the lower left renal sinus, differential includes layering calcification within a small calyceal diverticulum versus nonobstructing stone versus vascular calcification. No hydronephrosis. Electronically Signed   By: Ilona Sorrel M.D.   On: 01/31/2018 15:48   Dg Chest 2 View  Result Date: 01/31/2018 CLINICAL DATA:  Near syncopal episode EXAM: CHEST - 2 VIEW COMPARISON:  09/08/2014 FINDINGS: The heart size and mediastinal contours are within normal limits. Both lungs are clear. The visualized skeletal structures are unremarkable. IMPRESSION: No active cardiopulmonary disease. Electronically Signed   By: Inez Catalina M.D.   On: 01/31/2018 14:40   Dg Lumbar Spine 2-3 Views  Result Date: 01/15/2018 CLINICAL DATA:  Fall.  Back pain. EXAM: LUMBAR SPINE - 2-3 VIEW COMPARISON:  CT abdomen pelvis 09/21/2016, lumbar spine x-rays 07/26/2013 FINDINGS: Pedicle screw and interbody fusion L3-4 L4-5. Hardware in good position. Disc degeneration and mild retrolisthesis L1-2 and L2-3. Mild anterolisthesis L4-5. Negative for fracture IMPRESSION: Negative for fracture.  PLIF L3-4 and L4-5 Electronically Signed   By: Franchot Gallo M.D.   On: 01/15/2018 15:36   [place] Aaa Duplex Limited  Result Date: 01/15/2018 CLINICAL DATA:  History of abdominal aortic aneurysm. Hypertension. Stroke. Syncope. Diabetes. Bruit. Hyperlipidemia. Appropriate age. EXAM: US ABDOMINAL AORTA MEDICARE SCREENING TECHNIQUE: Ultrasound examination of the abdominal aorta was performed as a screening evaluation for abdominal aortic aneurysm. COMPARISON:  09/21/2016 FINDINGS: Abdominal aortic measurements as follows: Proximal:  2.1 cm Mid:  2.1 cm  Distal:  3.7 cm Maximal diameter on the prior study was 2.4 cm based on my measurements. Maximal right and left common iliac artery diameters are 1.3 and 1.6 cm respectively. IMPRESSION: Maximal diameter of the abdominal aorta is 3.7 cm. Recommend followup by ultrasound in 3 years. This recommendation follows ACR consensus guidelines: White Paper of the ACR Incidental Findings Committee II on Vascular Findings. Joellyn Rued Radiol 2013; [phone] Electronically Signed   By: Marybelle Killings M.D.   On: 01/15/2018 14:19       Today   Subjective:   Delia Heady feeling well denies any complaint  Objective:   Blood pressure (!) 114/49, pulse 64, temperature (!) 97.4 F (36.3 C), temperature source Oral, resp. rate 19, height 5\' 9"  (1.753 m), weight 78.8 kg (173 lb 12.8 oz), SpO2 100 %.  .  Intake/Output Summary (Last 24 hours) at 02/02/2018 1404 Last data filed at 02/02/2018 1346 Gross per 24 hour  Intake 1734.17 ml  Output 1400 ml  Net 334.17 ml    Exam VITAL SIGNS: Blood pressure (!) 114/49, pulse 64, temperature (!) 97.4 F (36.3 C), temperature source Oral, resp. rate 19, height 5\' 9"  (1.753 m), weight 78.8 kg (173 lb 12.8 oz), SpO2 100 %.  GENERAL:  84 y.o.-year-old patient lying in the bed with no acute distress.  EYES: Pupils equal, round, reactive to light and accommodation. No scleral icterus. Extraocular muscles intact.  HEENT: Head atraumatic, normocephalic. Oropharynx and nasopharynx clear.  NECK:  Supple, no jugular venous distention. No thyroid enlargement, no tenderness.  LUNGS: Normal breath sounds bilaterally, no wheezing, rales,rhonchi or crepitation. No use of accessory muscles of respiration.  CARDIOVASCULAR: S1, S2 normal. No murmurs, rubs, or gallops.  ABDOMEN: Soft, nontender, nondistended. Bowel sounds present. No organomegaly or mass.  EXTREMITIES: No pedal edema, cyanosis, or clubbing.  NEUROLOGIC: Cranial nerves II through XII are intact. Muscle strength 5/5 in all  extremities. Sensation intact. Gait not checked.  PSYCHIATRIC: The patient is alert and oriented x  3.  SKIN: No obvious rash, lesion, or ulcer.   Data Review     CBC w Diff:  Lab Results  Component Value Date   WBC 6.9 02/01/2018   HGB 11.2 (L) 02/01/2018   HGB 11.1 (L) 12/10/2017   HCT 32.7 (L) 02/01/2018   HCT 34.5 (L) 12/10/2017   PLT 181 02/01/2018   PLT 222 12/10/2017   LYMPHOPCT 15 01/31/2018   MONOPCT 12 01/31/2018   EOSPCT 4 01/31/2018   BASOPCT 1 01/31/2018   CMP:  Lab Results  Component Value Date   NA 137 02/01/2018   NA 137 12/10/2017   NA 139 12/24/2011   K 4.0 02/01/2018   K 3.9 12/24/2011   CL 105 02/01/2018   CL 102 12/24/2011   CO2 24 02/01/2018   CO2 25 12/24/2011   BUN 17 02/01/2018   BUN 20 12/10/2017   BUN 22 (H) 12/24/2011   CREATININE 1.14 02/01/2018   CREATININE 1.30 12/24/2011   PROT 7.0 01/31/2018   PROT 6.7 12/10/2017   PROT 6.8 12/24/2011   ALBUMIN 4.1 01/31/2018   ALBUMIN 4.2 12/10/2017   ALBUMIN 4.0 12/24/2011   BILITOT 0.5 01/31/2018   BILITOT 0.5 12/10/2017   BILITOT 0.3 12/24/2011   ALKPHOS 40 01/31/2018   ALKPHOS 57 12/24/2011   AST 26 01/31/2018   AST 31 08/19/2017   AST 32 12/24/2011   ALT 6 01/31/2018   ALT 21 08/19/2017   ALT 26 12/24/2011  .  Micro Results No results found for this or any previous visit (from the past 240 hour(s)).      Code Status Orders  (From admission, onward)        Start     Ordered   01/31/18 1752  Full code  Continuous     01/31/18 1751    Code Status History    This patient has a current code status but no historical code status.    Advance Directive Documentation     Most Recent Value  Type of Advance Directive  Living will, Healthcare Power of Attorney  Pre-existing out of facility DNR order (yellow form or pink MOST form)  -  "MOST" Form in Place?  -          [place], [place], PA-C. Go on 02/08/2018.   Specialty:  Family  Medicine Why:  Appointment Time: 10:45am Contact information: [place] [address] [phone]        [name], MD. Go on 03/12/2018.   Specialty:  Cardiology Why:  hosp f/u bradycardia, low bp, Appointment Time: 11:00am Contact information: [place] [address] [phone]           Discharge Medications   Allergies as of 02/02/2018   No Known Allergies     Medication List    STOP taking these medications   benazepril 5 MG tablet Commonly known as:  LOTENSIN   DIGOX 0.125 MG tablet Generic drug:  digoxin   diltiazem 30 MG tablet Commonly known as:  CARDIZEM   metoprolol succinate 50 MG 24 hr tablet Commonly known as:  TOPROL-XL   traMADol 50 MG tablet Commonly known as:  ULTRAM     TAKE these medications   acetaminophen 500 MG tablet Commonly known as:  TYLENOL Take 1,000 mg by mouth every 8 (eight) hours as needed.   apixaban 5 MG Tabs tablet Commonly known as:  ELIQUIS Take 1 tablet (  5 mg total) by mouth 2 (two) times daily.   CALCIUM + D PO Take 1 tablet by mouth 2 (two) times daily.   CEROVITE SENIOR PO Take 1 tablet by mouth daily.   cyanocobalamin 1000 MCG/ML injection Commonly known as:  (VITAMIN B-12) INJECT 1 ML INTO THE MUSCLE EVERY 30 DAYS   dapagliflozin propanediol 10 MG Tabs tablet Commonly known as:  FARXIGA Take 10 mg by mouth daily.   flecainide 50 MG tablet Commonly known as:  TAMBOCOR TAKE 1 TABLET BY MOUTH TWICE DAILY   glipiZIDE-metformin 5-500 MG tablet Commonly known as:  METAGLIP Take 1 tablet by mouth 2 (two) times daily.   GLUCOSAMINE-CHONDROITIN-VIT D3 PO Take by mouth daily.   mometasone 0.1 % cream Commonly known as:  ELOCON Apply 1 application topically daily.   pantoprazole 40 MG tablet Commonly known as:  PROTONIX Take 1 tablet (40 mg total) by mouth 2 (two) times daily.   PHILLIPS COLON HEALTH PO Take by mouth.   rosuvastatin 10 MG  tablet Commonly known as:  CRESTOR Take 1 tablet (10 mg total) by mouth daily.   SINEMET 25-100 MG tablet Generic drug:  carbidopa-levodopa Take 1 tablet by mouth 4 (four) times daily.   sitaGLIPtin 100 MG tablet Commonly known as:  JANUVIA Take 1 tablet (100 mg total) by mouth daily.   torsemide 20 MG tablet Commonly known as:  DEMADEX Take 1 tablet (20 mg total) by mouth daily as needed.   triamcinolone cream 0.1 % Commonly known as:  KENALOG Apply 1 application topically 2 (two) times daily.          Total Time in preparing paper work, data evaluation and todays exam - 62 minutes  Dustin Flock M.D on 02/02/2018 at 2:04 PM [place]  [phone]

## 2018-02-03 ENCOUNTER — Telehealth: Payer: Self-pay

## 2018-02-03 NOTE — Telephone Encounter (Signed)
It appears his benazepril and metoprolol were held at discharge I had diltiazem 30 mg pills as needed only Would recommend we monitor his blood pressure not from the past several weeks by going forward Would recommend  blood pressure sitting with follow-up blood pressure standing take immediately to look for orthostasis  Need pulse rates If pulse rate runs high perhaps we could use half dose metoprolol If blood pressure running high would re-start the benazepril 5 mg

## 2018-02-03 NOTE — Telephone Encounter (Signed)
Transition Care Management Follow-up Telephone Call   Spoke with patients daughter Timothy [name].   How have you been since you were released from the hospital? "hes doing okay but we have some concerns with his medications"  Do you understand why you were in the hospital? yes  Do you have a copy of your discharge instructions Yes Do you understand the discharge instrcutions? yes  Where were you discharged to? Home  Do you have support at home? Yes    Items Reviewed:  Medications obtained Yes  Medications reviewed: Yes  Dietary changes reviewed: yes  Home Health? yes  DME ordered at discharge obtained? NA  Medical supplies: NA    Functional Questionnaire:   Activities of Daily Living (ADLs):   He states they are independent in the following: feeding and toileting States they require assistance with the following: ambulation, bathing and hygiene, continence, grooming, dressing and medication management  Any transportation issues/concerns?: no  Any patient concerns? Yes, concerned with the BP medications that were discharged. Needs an FL2 form filled out for SNF. (fl2 form should be faxed over today, Ms.Glea will call this afternoon to confirm it was received in office for appt tomorrow.)  Confirmed importance and date/time of follow-up visits scheduled with PCP: yes, changed to sooner appt per patient request. Scheduled for 02/04/2018 at 1045am with Carles Collet, [place].   Confirm appointment scheduled with specialist? Yes  Confirmed with patient if condition begins to worsen call PCP or If it's emergency go to the ER.

## 2018-02-03 NOTE — Telephone Encounter (Signed)
Patient wife calling States the patients HR has been running in the 100s Would like to speak with nurse Please call to discuss

## 2018-02-03 NOTE — Telephone Encounter (Signed)
Spoke with patients daughter per release form and she states that the list dropped off was for Dr. Rockey Situ to see his trend of readings. These readings were taken first thing in the morning before his medications. He was in the hospital and they didn't feel he should have been discharged so quickly. She states that his blood pressure today was 164/78 and pulse of 100. She states that they took him off all of his blood pressure medications and she has concerns about this and now his blood pressures are trending back up. Advised that BP list was sent to Dr. Rockey Situ for his review. She wanted to know if someone would contact her tonight and advised that I am the only one here for now. She voiced concerns about these changes and would like to know Dr. Donivan Scull input. Advised that I would route this over to provider for his review and would be in touch. She was appreciative for the call with no further questions at this time.

## 2018-02-04 ENCOUNTER — Ambulatory Visit (INDEPENDENT_AMBULATORY_CARE_PROVIDER_SITE_OTHER): Payer: Medicare Other | Admitting: Physician Assistant

## 2018-02-04 ENCOUNTER — Encounter: Payer: Self-pay | Admitting: Physician Assistant

## 2018-02-04 VITALS — BP 129/74 | HR 112

## 2018-02-04 DIAGNOSIS — I10 Essential (primary) hypertension: Secondary | ICD-10-CM

## 2018-02-04 DIAGNOSIS — I480 Paroxysmal atrial fibrillation: Secondary | ICD-10-CM

## 2018-02-04 DIAGNOSIS — Z111 Encounter for screening for respiratory tuberculosis: Secondary | ICD-10-CM

## 2018-02-04 DIAGNOSIS — Z09 Encounter for follow-up examination after completed treatment for conditions other than malignant neoplasm: Secondary | ICD-10-CM | POA: Diagnosis not present

## 2018-02-04 NOTE — Telephone Encounter (Signed)
Spoke with patients daughter and reviewed that if his blood pressures remain elevated then we would want to start his medications back one at a time. Reviewed Dr. Donivan Scull recommendations to continue monitoring his blood pressures and heart rates and that if they remain elevated we would add one at a time starting with benazepril. Advised to also take blood pressure sitting and then standing to see if there are an fluctuations as well. Advised that I would try and schedule follow up appointment and would give her a call with that date and time. She verbalized understanding of our conversation, agreement with plan, and had no further questions at this time.

## 2018-02-04 NOTE — Telephone Encounter (Signed)
Spoke with patients daughter per release form and reviewed Dr. Donivan Scull recommendations. She is not agreeable with this assessment and just does not understand why all of these changes were made during his stay. She sounded frustrated and just had numerous questions about why each of those medications were stopped and wanted to know if cardiology was the one who discontinued them and why based on that one event did they remove everything. Reviewed that hospital physician and cardiology evaluated patient. She wanted to know if Dr. Rockey Situ specifically discontinued those medications. Based on her numerous questions and concerns I feel she just needs to come in and speak with provider. Advised that I would check to see if we can get patient in to be seen today or tomorrow and would call her back with that appointment information. She verbalized understanding with no further questions at this time.

## 2018-02-04 NOTE — Telephone Encounter (Signed)
Scheduled patient to come in and see Dr. Rockey Situ on 02/19/18 at 3:20 PM. Unable to leave voicemail message on number listed.

## 2018-02-04 NOTE — Progress Notes (Signed)
   Subjective:    Patient ID: Timothy [name], male    DOB: Aug 31, 1928, 84 y.o.   MRN: [id number]  Timothy [name] is a 84 y.o. male presenting on 02/04/2018 for Hospitalization Follow-up and Form Completion [facility])   HPI   Patient presents today for a hospital follow up. PMH significant for paroxysmal atrial fibrillation on both rate and rhythm control, history of TIA, HTN, AAA, SVT, DM II, HLD, Parkinson's Disease. He presents today with his daughter [name].   On 01/31/2018 he had an episode at church where he felt like he was going to pass out. No reported LOC but a period of time where he did not remember things as well. He was brought to the ER where is blood pressure was slightly low and he was slightly bradycardic. He complained of abdominal pain and so CT abdomen was done which redemonstrated an AAA but did not show any acute pathology. His echo showed EF 60%-65%. His digoxin, Cardizem, Benazepril, and Metoprolol were discontinued dye  Currently living at home alone, daughter would like FL2 form filled out because she wants to try to get him into [place], [place]. She says she would like a rehabilitation place for him as she feels like his chronic conditions are too much for the   Social History   Tobacco Use  . Smoking status: Never Smoker  . Smokeless tobacco: Never Used  Substance Use Topics  . Alcohol use: No  . Drug use: No    Review of Systems Per HPI unless specifically indicated above     Objective:    BP 129/74 Comment: Standing  Pulse (!) 112   SpO2 95%   Wt Readings from Last 3 Encounters:  02/19/18 176 lb 8 oz (80.1 kg)  02/02/18 173 lb 12.8 oz (78.8 kg)  01/15/18 173 lb (78.5 kg)    Physical Exam  Constitutional: He is oriented to person, place, and time. He appears well-developed and well-nourished.  Cardiovascular: Regular rhythm. Tachycardia present.  Pulmonary/Chest: Effort normal and breath sounds normal.  Neurological: He is alert and oriented to  person, place, and time.  Shuffling gait, walks with walker. Somewhat masked facies, less frequent blinking.   Skin: Skin is warm and dry.  Psychiatric: He has a normal mood and affect. His behavior is normal.   Results for orders placed or performed in visit on 02/04/18  QuantiFERON-TB Gold Plus  Result Value Ref Range   QuantiFERON Incubation Incubation performed.    QuantiFERON Criteria Comment    QuantiFERON TB1 Ag Value 0.11 IU/mL   QuantiFERON TB2 Ag Value 0.08 IU/mL   QuantiFERON Nil Value 0.31 IU/mL   QuantiFERON Mitogen Value >10.00 IU/mL   QuantiFERON-TB Gold Plus Negative Negative      Assessment & Plan:  1. Hospital discharge follow-up I have reconciled current medication lists.  2. Essential hypertension  F/u cardiology.  3. Paroxysmal atrial fibrillation (HCC)  F/u cardiology  4. Screening-pulmonary TB  - QuantiFERON-TB Gold Plus  I have spent 25 minutes with this patient, >50% of which was spent on counseling and coordination of care.   Follow up plan: Return if symptoms worsen or fail to improve.  Carles Collet, PA-C [facility] 03/11/2018, 4:32 PM

## 2018-02-05 NOTE — Telephone Encounter (Signed)
Spoke with patients daughter per release form and confirmed appointment scheduled for patient. Also instructed her to keep a log of his blood pressure readings to bring into that appointment. She verbalized understanding with no further questions at this time.

## 2018-02-06 ENCOUNTER — Other Ambulatory Visit: Payer: Self-pay | Admitting: Family Medicine

## 2018-02-06 DIAGNOSIS — E1159 Type 2 diabetes mellitus with other circulatory complications: Secondary | ICD-10-CM

## 2018-02-08 ENCOUNTER — Telehealth: Payer: Self-pay

## 2018-02-08 ENCOUNTER — Inpatient Hospital Stay: Payer: Medicare Other | Admitting: Family Medicine

## 2018-02-08 NOTE — Telephone Encounter (Signed)
FYI- please see below  Copied from [place] [phone]. Topic: Quick Communication - See Telephone Encounter >> Feb 04, 2018 10:02 AM Antonieta Iba C wrote: CRM for notification. See Telephone encounter for: 02/04/18.  Amy w/ [facility] called in to make provider aware that pt declined nursing services but do want therapy. They will start with him next week.

## 2018-02-08 NOTE — Telephone Encounter (Signed)
Farxiga 10 mg refill request also pharmacy requesting a new Rx for #30.  LOV 02/04/18 with [place], [place]

## 2018-02-09 ENCOUNTER — Ambulatory Visit: Payer: Medicare Other | Admitting: Physical Therapy

## 2018-02-09 ENCOUNTER — Ambulatory Visit: Payer: Medicare Other | Admitting: Speech Pathology

## 2018-02-10 LAB — QUANTIFERON-TB GOLD PLUS
QuantiFERON Mitogen Value: >10 IU/mL
QuantiFERON Nil Value: 0.31 IU/mL
QuantiFERON TB1 Ag Value: 0.11 IU/mL
QuantiFERON TB2 Ag Value: 0.08 IU/mL
QuantiFERON-TB Gold Plus: NEGATIVE

## 2018-02-15 ENCOUNTER — Ambulatory Visit: Payer: Medicare Other | Admitting: Physical Therapy

## 2018-02-15 ENCOUNTER — Encounter: Payer: Medicare Other | Admitting: Speech Pathology

## 2018-02-16 ENCOUNTER — Encounter: Payer: Medicare Other | Admitting: Speech Pathology

## 2018-02-16 ENCOUNTER — Ambulatory Visit: Payer: Medicare Other | Admitting: Physical Therapy

## 2018-02-17 ENCOUNTER — Encounter: Payer: Medicare Other | Admitting: Speech Pathology

## 2018-02-18 DIAGNOSIS — I350 Nonrheumatic aortic (valve) stenosis: Secondary | ICD-10-CM | POA: Insufficient documentation

## 2018-02-18 NOTE — Progress Notes (Signed)
Cardiology Office Note  Date:  02/19/2018   ID:  KEROLOS NEHME, DOB 1928-07-27, MRN [id number]  PCP:  Guadalupe Maple, MD   Chief Complaint  Patient presents with  . other    F/u ED near syncope. Meds reviewed verbally with pt.    HPI:  Mr. Dirosa is a very pleasant 84 year old gentleman with a history of  Hyperlipidemia, hypertension,  diabetes,  with no known coronary artery disease,  3.5 cm abdominal aortic aneurysm seen on ultrasound in 2014  mild to moderate aortic valve stenosis. Stress echo in early 2016 at [place] fibrillation recently seen on even monitor, started on anticoagulation and flecainide bilateral popliteal artery aneurysms, B/l popliteal bypass in 12/2012 and 05/2013 for 1.7 cm aneurysm popliteal Back surgery 2016 US aorta 09/2012: 3.7 cm U/s 05/2015: LE arterial U/S, aorta 2.5 cm He presents today for follow-up of his high cholesterol, PAD, paroxysmal atrial fibrillation  Recent hospitalization July 2089for dizziness orthostasis He was in church, had some abdominal discomfort, family reports he let go of significant GI wind noted by all around him, shortly following this reported he did not feel well, lightheaded sweaty, appeared pale to family. Family checked his pulse and it felt somewhat slow EMTs were called and heart rate noted to be in the 50s with low blood pressure low 253G systolic Slow recovery of his blood pressure Never lost consciousness, no documentation of arrhythmia concerning for vasovagal etiology  Blood pressures from home typically 644 up to 034 systolic pressures in the office today 742 up to 595 systolic Uses a wrist cuff at home  limited by Parkinsons Walks with a walker Reports legs are very weak Prior history of falls  EKG personally reviewed by myself on todays visit  left anterior fascicular block poor R-wave progression through the anterior precordial leads  There past medicastory reviewed Fall 02/16/2017 Hit  head, concusion Hurt should arm, CT head, xray shoulder  Lab work reviewed with him HBA1C 8.2, trending upwards Desserts Total chol 120   takes torsemide as needed for ankle edema  He is not doing much exercise, working with physical therapy   recent back surgery at [facility] in June 2016 with postoperative complications including ileus, atrial fibrillation, congestive heart failure/swelling. He spent several weeks at rehabilitation.  In follow-up, was noted to have elevated heart rate 150 bpm, sent to the emergency room. Was found to have SVT. This broke on its own then developed atrial fibrillation. He was given digoxin IV push for heart rate control in the setting of low blood pressure. Then converted to normal sinus rhythm.   12/14/2012 he had popliteal aneurysm surgery at [facility]. Other leg was done 05/21/2013.   total cholesterol 140, LDL 53, HDL 39, hemoglobin A1c 6.86  Previous echocardiogram and stress test  were essentially normal Echocardiogram showed normal LV function with systolic ejection fraction greater than 55%, evidence of diastolic relaxation abnormality, normal right ventricular systolic pressures, mild aortic valve regurgitation, borderline to mild aortic valve stenosis  Mild plaquing noted in the carotid bilaterally   PMH:   has a past medical history of Back pain, CHF (congestive heart failure) ([place]), Esophageal reflux, Osteitis deformans without mention of bone tumor, Senile osteoporosis, Skin cancer, and TIA (transient ischemic attack).  PSH:    Past Surgical History:  Procedure Laterality Date  . APPENDECTOMY    . BACK SURGERY    . CATARACT EXTRACTION    . COLONOSCOPY  [id number]  . ESOPHAGEAL DILATION    .  GALLBLADDER SURGERY    . HERNIA REPAIR    . RETINAL DETACHMENT SURGERY    . TONSILLECTOMY      Current Outpatient Medications  Medication Sig Dispense Refill  . acetaminophen (TYLENOL) 500 MG tablet Take 1,000 mg by mouth every 8  (eight) hours as needed.     . Calcium Citrate-Vitamin D (CALCIUM + D PO) Take 1 tablet by mouth 2 (two) times daily.     . carbidopa-levodopa (SINEMET) 25-100 MG tablet Take 1 tablet by mouth 4 (four) times daily.     . cyanocobalamin (,VITAMIN B-12,) 1000 MCG/ML injection INJECT 1 ML INTO THE MUSCLE EVERY 30 DAYS 1 mL 12  . ELIQUIS 5 MG TABS tablet TAKE 1 TABLET BY MOUTH TWICE DAILY 60 tablet 3  . FARXIGA 10 MG TABS tablet TAKE ONE TABLET BY MOUTH EVERY DAY 30 tablet 6  . flecainide (TAMBOCOR) 50 MG tablet TAKE 1 TABLET BY MOUTH TWICE DAILY 180 tablet 0  . glipiZIDE-metformin (METAGLIP) 5-500 MG tablet Take 1 tablet by mouth 2 (two) times daily. 180 tablet 1  . GLUCOSAMINE-CHONDROITIN-VIT D3 PO Take by mouth daily.    . mometasone (ELOCON) 0.1 % cream Apply 1 application topically daily. 45 g 0  . Multiple Vitamins-Minerals (CEROVITE SENIOR PO) Take 1 tablet by mouth daily.    . pantoprazole (PROTONIX) 40 MG tablet Take 1 tablet (40 mg total) by mouth 2 (two) times daily. 60 tablet 12  . Probiotic Product ([place]) Take by mouth.    . rosuvastatin (CRESTOR) 10 MG tablet Take 1 tablet (10 mg total) by mouth daily. 30 tablet 12  . sitaGLIPtin (JANUVIA) 100 MG tablet Take 1 tablet (100 mg total) by mouth daily. 30 tablet 12  . torsemide (DEMADEX) 20 MG tablet Take 1 tablet (20 mg total) by mouth daily as needed.    . triamcinolone cream (KENALOG) 0.1 % Apply 1 application topically 2 (two) times daily. 30 g 0   No current facility-administered medications for this visit.      Allergies:   Patient has no known allergies.   Social History:  The patient  reports that he has never smoked. He has never used smokeless tobacco. He reports that he does not drink alcohol or use drugs.   Family History:   family history includes Diabetes in his unknown relative; Heart disease in his brother and mother; Stroke in his brother.    Review of Systems: Review of Systems   Constitutional: Negative.   Respiratory: Negative.   Cardiovascular: Negative.   Gastrointestinal: Negative.   Musculoskeletal:       Unsteady gait  Neurological: Negative.   Psychiatric/Behavioral: Negative.   All other systems reviewed and are negative.   PHYSICAL EXAM: VS:  BP 116/68 (BP Location: Left Arm, Patient Position: Sitting, Cuff Size: Normal)   Pulse 79   Ht 5\' 9"  (1.753 m)   Wt 176 lb 8 oz (80.1 kg)   BMI 26.06 kg/m  , BMI Body mass index is 26.06 kg/m. No change in exam on today's visit GEN: Well nourished, well developed, in no acute distress , presents with a cane HEENT: normal  Neck: no JVD, carotid bruits, or masses Cardiac: RRR; no murmurs, rubs, or gallops,no edema  Respiratory:  clear to auscultation bilaterally, normal work of breathing GI: soft, nontender, nondistended, + BS MS: no deformity or atrophy  Skin: warm and dry, no rash Neuro:  Strength and sensation are intact Psych: euthymic mood, full affect  Recent Labs: 12/10/2017: TSH 1.950 01/31/2018: ALT 6; Magnesium 2.4 02/01/2018: BUN 17; Creatinine, Ser 1.14; Hemoglobin 11.2; Platelets 181; Potassium 4.0; Sodium 137    Lipid Panel Lab Results  Component Value Date   CHOL 116 12/10/2017   HDL 32 (L) 12/10/2017   LDLCALC 21 12/10/2017   TRIG 317 (H) 12/10/2017      Wt Readings from Last 3 Encounters:  02/19/18 176 lb 8 oz (80.1 kg)  02/02/18 173 lb 12.8 oz (78.8 kg)  01/15/18 173 lb (78.5 kg)       ASSESSMENT AND PLAN:  Paroxysmal atrial fibrillation ([place]) - Plan: EKG 12-Lead Maintaining normal sinus rhythm Tolerating anticoagulation Continue low-dose metoprolol 25 daily  AAA (abdominal aortic aneurysm) without rupture (HCC) Mildly dilated aneurysm in 2014 3.7 cm July 2019  Essential hypertension Suspect high blood pressure at home in the morning lower in the afternoon today down to 324-401 systolic Asymptomatic Currently not on benazepril only on metoprolol 25  Type  2 diabetes mellitus with other circulatory complication, without long-term current use of insulin (HCC) Typically runs high 8.0 Recommended low carbohydrate diet  Parkinson's disease ([place]) On Sinemet, need to exercise Legs are weaker Using a walker, high risk of falls  Peripheral arterial disease ([place]) Bilateral popliteal aneurysm, had bilateral bypass surgery at [facility] Periodic ultrasound  Aortic valve stenosis  Borderline to mild aortic valve stenosis on echocardiogram 2014 moderate aortic valve stenosis July 2019   Total encounter time more than 45 minutes  Greater than 50% was spent in counseling and coordination of care with the patient   Disposition:   F/U  6 months   Orders Placed This Encounter  Procedures  . EKG 12-Lead     Signed, Esmond Plants, M.D., Ph.D. 02/19/2018  [place], [place]

## 2018-02-19 ENCOUNTER — Ambulatory Visit (INDEPENDENT_AMBULATORY_CARE_PROVIDER_SITE_OTHER): Payer: Medicare Other | Admitting: Cardiovascular Disease

## 2018-02-19 ENCOUNTER — Encounter: Payer: Self-pay | Admitting: Cardiovascular Disease

## 2018-02-19 ENCOUNTER — Other Ambulatory Visit: Payer: Self-pay | Admitting: Family Medicine

## 2018-02-19 VITALS — BP 116/68 | HR 79 | Ht 69.0 in | Wt 176.5 lb

## 2018-02-19 DIAGNOSIS — I350 Nonrheumatic aortic (valve) stenosis: Secondary | ICD-10-CM | POA: Diagnosis not present

## 2018-02-19 DIAGNOSIS — I471 Supraventricular tachycardia: Secondary | ICD-10-CM | POA: Diagnosis not present

## 2018-02-19 DIAGNOSIS — E1159 Type 2 diabetes mellitus with other circulatory complications: Secondary | ICD-10-CM

## 2018-02-19 DIAGNOSIS — I714 Abdominal aortic aneurysm, without rupture, unspecified: Secondary | ICD-10-CM

## 2018-02-19 DIAGNOSIS — I480 Paroxysmal atrial fibrillation: Secondary | ICD-10-CM

## 2018-02-19 DIAGNOSIS — I10 Essential (primary) hypertension: Secondary | ICD-10-CM

## 2018-02-19 DIAGNOSIS — E782 Mixed hyperlipidemia: Secondary | ICD-10-CM | POA: Diagnosis not present

## 2018-02-19 MED ORDER — METOPROLOL SUCCINATE ER 25 MG PO TB24: 25.0000 mg | ORAL_TABLET | Freq: Every day | ORAL | 3 refills | Status: DC

## 2018-02-19 NOTE — Patient Instructions (Addendum)

## 2018-02-19 NOTE — Telephone Encounter (Signed)
eliquis refill Last Refill:08/19/17 # 60 6 RF Last OV: 08/19/17 PCP: [place]

## 2018-02-22 ENCOUNTER — Encounter: Payer: Medicare Other | Admitting: Speech Pathology

## 2018-02-22 ENCOUNTER — Ambulatory Visit: Payer: Medicare Other | Admitting: Physical Therapy

## 2018-02-23 ENCOUNTER — Encounter: Payer: Medicare Other | Admitting: Speech Pathology

## 2018-02-23 ENCOUNTER — Ambulatory Visit: Payer: Medicare Other | Admitting: Physical Therapy

## 2018-02-24 ENCOUNTER — Encounter: Payer: Medicare Other | Admitting: Speech Pathology

## 2018-02-24 ENCOUNTER — Ambulatory Visit: Payer: Medicare Other | Admitting: Physical Therapy

## 2018-02-25 ENCOUNTER — Encounter: Payer: Medicare Other | Admitting: Speech Pathology

## 2018-02-25 ENCOUNTER — Ambulatory Visit: Payer: Medicare Other | Admitting: Physical Therapy

## 2018-03-01 ENCOUNTER — Encounter: Payer: Medicare Other | Admitting: Speech Pathology

## 2018-03-01 ENCOUNTER — Ambulatory Visit: Payer: Medicare Other | Admitting: Physical Therapy

## 2018-03-02 ENCOUNTER — Encounter: Payer: Medicare Other | Admitting: Speech Pathology

## 2018-03-02 ENCOUNTER — Ambulatory Visit: Payer: Medicare Other | Admitting: Physical Therapy

## 2018-03-03 ENCOUNTER — Encounter: Payer: Medicare Other | Admitting: Speech Pathology

## 2018-03-03 ENCOUNTER — Ambulatory Visit: Payer: Medicare Other | Admitting: Physical Therapy

## 2018-03-04 ENCOUNTER — Ambulatory Visit: Payer: Medicare Other | Admitting: Physical Therapy

## 2018-03-04 ENCOUNTER — Encounter: Payer: Medicare Other | Admitting: Speech Pathology

## 2018-03-09 ENCOUNTER — Ambulatory Visit: Payer: Medicare Other | Admitting: Physical Therapy

## 2018-03-09 ENCOUNTER — Encounter: Payer: Medicare Other | Admitting: Speech Pathology

## 2018-03-10 ENCOUNTER — Ambulatory Visit: Payer: Medicare Other | Admitting: Physical Therapy

## 2018-03-10 ENCOUNTER — Encounter: Payer: Medicare Other | Admitting: Speech Pathology

## 2018-03-11 ENCOUNTER — Encounter: Payer: Medicare Other | Admitting: Speech Pathology

## 2018-03-11 ENCOUNTER — Ambulatory Visit: Payer: Medicare Other | Admitting: Physical Therapy

## 2018-03-12 ENCOUNTER — Ambulatory Visit: Payer: Medicare Other | Admitting: Cardiovascular Disease

## 2018-03-15 ENCOUNTER — Encounter: Payer: Medicare Other | Admitting: Speech Pathology

## 2018-03-15 ENCOUNTER — Other Ambulatory Visit: Payer: Self-pay | Admitting: Cardiovascular Disease

## 2018-03-15 ENCOUNTER — Ambulatory Visit: Payer: Medicare Other | Admitting: Physical Therapy

## 2018-03-16 ENCOUNTER — Encounter: Payer: Medicare Other | Admitting: Speech Pathology

## 2018-03-16 ENCOUNTER — Ambulatory Visit: Payer: Medicare Other | Admitting: Physical Therapy

## 2018-03-17 ENCOUNTER — Encounter: Payer: Medicare Other | Admitting: Speech Pathology

## 2018-03-17 ENCOUNTER — Ambulatory Visit: Payer: Medicare Other | Admitting: Physical Therapy

## 2018-03-18 ENCOUNTER — Ambulatory Visit: Payer: Medicare Other | Admitting: Physical Therapy

## 2018-03-18 ENCOUNTER — Encounter: Payer: Medicare Other | Admitting: Speech Pathology

## 2018-03-22 ENCOUNTER — Ambulatory Visit: Payer: Medicare Other | Admitting: Physical Therapy

## 2018-03-22 ENCOUNTER — Encounter: Payer: Medicare Other | Admitting: Speech Pathology

## 2018-03-25 DIAGNOSIS — R4689 Other symptoms and signs involving appearance and behavior: Secondary | ICD-10-CM | POA: Diagnosis not present

## 2018-03-25 DIAGNOSIS — G20 Parkinson's disease: Secondary | ICD-10-CM | POA: Diagnosis not present

## 2018-03-31 ENCOUNTER — Other Ambulatory Visit: Payer: Self-pay | Admitting: Neurology

## 2018-03-31 DIAGNOSIS — R4689 Other symptoms and signs involving appearance and behavior: Secondary | ICD-10-CM

## 2018-04-06 DIAGNOSIS — Z23 Encounter for immunization: Secondary | ICD-10-CM | POA: Diagnosis not present

## 2018-04-08 ENCOUNTER — Ambulatory Visit (INDEPENDENT_AMBULATORY_CARE_PROVIDER_SITE_OTHER): Payer: Medicare Other | Admitting: Family Medicine

## 2018-04-08 ENCOUNTER — Encounter: Payer: Self-pay | Admitting: Family Medicine

## 2018-04-08 VITALS — BP 134/77 | HR 83 | Wt 169.0 lb

## 2018-04-08 DIAGNOSIS — E1159 Type 2 diabetes mellitus with other circulatory complications: Secondary | ICD-10-CM | POA: Diagnosis not present

## 2018-04-08 DIAGNOSIS — F419 Anxiety disorder, unspecified: Secondary | ICD-10-CM

## 2018-04-08 DIAGNOSIS — I10 Essential (primary) hypertension: Secondary | ICD-10-CM

## 2018-04-08 LAB — BAYER DCA HB A1C WAIVED: HB A1C (BAYER DCA - WAIVED): 7.6 % — ABNORMAL HIGH (ref <7.0)

## 2018-04-08 NOTE — Progress Notes (Signed)
BP 134/77 (BP Location: Left Arm, Patient Position: Sitting, Cuff Size: Normal)   Pulse 83   Wt 169 lb (76.7 kg)   SpO2 97%   BMI 24.96 kg/m    Subjective:    Patient ID: Timothy [name], male    DOB: 08/05/1928, 84 y.o.   MRN: [id number]  HPI: Timothy [name] is a 84 y.o. male  Chief Complaint  Patient presents with  . Diabetes  Discussed diabetes which is stable with patient other medication stable Dr. Melrose Nakayama has suggested tapering and discontinuing SINEMET Also is wanting to start Zoloft.  Patient is done neither so far. Patient other medications seem to be stable. Patient also seeing Dr. Melrose Nakayama for possible dementia with no diagnosis of dementia. On further discussion with patient and family in separate rooms seems to be a real mess going on with patient seeing another lady with possible dementia after the death of his wife after many years. Family is distraught because of separation of her father and with this other lady.  There appears to be a deep conflict.  Time was spent discussing these issues with both patient and daughter this was done separately.  Relevant past medical, surgical, family and social history reviewed and updated as indicated. Interim medical history since our last visit reviewed. Allergies and medications reviewed and updated.  Review of Systems  Constitutional: Negative.   Respiratory: Negative.   Cardiovascular: Negative.     Per HPI unless specifically indicated above     Objective:    BP 134/77 (BP Location: Left Arm, Patient Position: Sitting, Cuff Size: Normal)   Pulse 83   Wt 169 lb (76.7 kg)   SpO2 97%   BMI 24.96 kg/m   Wt Readings from Last 3 Encounters:  04/08/18 169 lb (76.7 kg)  02/19/18 176 lb 8 oz (80.1 kg)  02/02/18 173 lb 12.8 oz (78.8 kg)    Physical Exam  Constitutional: He is oriented to person, place, and time. He appears well-developed and well-nourished.  HENT:  Head: Normocephalic and atraumatic.  Eyes: Conjunctivae  and EOM are normal.  Neck: Normal range of motion.  Cardiovascular: Normal rate, regular rhythm and normal heart sounds.  Pulmonary/Chest: Effort normal and breath sounds normal.  Musculoskeletal: Normal range of motion.  Neurological: He is alert and oriented to person, place, and time.  Skin: No erythema.  Psychiatric: He has a normal mood and affect. His behavior is normal. Judgment and thought content normal.    Results for orders placed or performed in visit on 02/04/18  QuantiFERON-TB Gold Plus  Result Value Ref Range   QuantiFERON Incubation Incubation performed.    QuantiFERON Criteria Comment    QuantiFERON TB1 Ag Value 0.11 IU/mL   QuantiFERON TB2 Ag Value 0.08 IU/mL   QuantiFERON Nil Value 0.31 IU/mL   QuantiFERON Mitogen Value >10.00 IU/mL   QuantiFERON-TB Gold Plus Negative Negative      Assessment & Plan:   Problem List Items Addressed This Visit      Cardiovascular and Mediastinum   Essential hypertension - Primary    The current medical regimen is effective;  continue present plan and medications.       Relevant Medications   digoxin (DIGOX) 0.125 MG tablet   Other Relevant Orders   Bayer DCA Hb A1c Waived     Endocrine   DM type 2 (diabetes mellitus, type 2) (HCC)    The current medical regimen is effective;  continue present plan and medications.  Other   Chronic anxiety    Reviewed stressful situation and family and encourage reconciliation      Relevant Medications   sertraline (ZOLOFT) 50 MG tablet       Follow up plan: Return in about 3 months (around 07/09/2018) for Hemoglobin A1c, BMP,  Lipids, ALT, AST.

## 2018-04-08 NOTE — Assessment & Plan Note (Signed)
The current medical regimen is effective;  continue present plan and medications.  

## 2018-04-08 NOTE — Assessment & Plan Note (Signed)
Reviewed stressful situation and family and encourage reconciliation

## 2018-04-16 ENCOUNTER — Ambulatory Visit
Admission: RE | Admit: 2018-04-16 | Discharge: 2018-04-16 | Disposition: A | Discharge status: Home / Self Care | Payer: Medicare Other | Source: Ambulatory Visit | Attending: Neurology | Admitting: Neurology

## 2018-04-16 DIAGNOSIS — R4689 Other symptoms and signs involving appearance and behavior: Secondary | ICD-10-CM | POA: Insufficient documentation

## 2018-04-16 DIAGNOSIS — G319 Degenerative disease of nervous system, unspecified: Secondary | ICD-10-CM | POA: Insufficient documentation

## 2018-04-16 DIAGNOSIS — F989 Unspecified behavioral and emotional disorders with onset usually occurring in childhood and adolescence: Secondary | ICD-10-CM | POA: Diagnosis not present

## 2018-05-04 ENCOUNTER — Emergency Department: Payer: Medicare Other

## 2018-05-04 ENCOUNTER — Emergency Department
Admission: EM | Admit: 2018-05-04 | Discharge: 2018-05-04 | Disposition: A | Discharge status: Home / Self Care | Payer: Medicare Other | Attending: Emergency Medicine | Admitting: Emergency Medicine

## 2018-05-04 ENCOUNTER — Other Ambulatory Visit: Payer: Self-pay

## 2018-05-04 DIAGNOSIS — R42 Dizziness and giddiness: Secondary | ICD-10-CM | POA: Diagnosis not present

## 2018-05-04 DIAGNOSIS — R55 Syncope and collapse: Secondary | ICD-10-CM | POA: Diagnosis not present

## 2018-05-04 DIAGNOSIS — E119 Type 2 diabetes mellitus without complications: Secondary | ICD-10-CM | POA: Insufficient documentation

## 2018-05-04 DIAGNOSIS — R51 Headache: Secondary | ICD-10-CM | POA: Diagnosis not present

## 2018-05-04 DIAGNOSIS — R112 Nausea with vomiting, unspecified: Secondary | ICD-10-CM

## 2018-05-04 DIAGNOSIS — I959 Hypotension, unspecified: Secondary | ICD-10-CM | POA: Diagnosis not present

## 2018-05-04 DIAGNOSIS — N289 Disorder of kidney and ureter, unspecified: Secondary | ICD-10-CM | POA: Diagnosis not present

## 2018-05-04 DIAGNOSIS — I110 Hypertensive heart disease with heart failure: Secondary | ICD-10-CM | POA: Insufficient documentation

## 2018-05-04 DIAGNOSIS — I509 Heart failure, unspecified: Secondary | ICD-10-CM | POA: Insufficient documentation

## 2018-05-04 DIAGNOSIS — J9811 Atelectasis: Secondary | ICD-10-CM | POA: Diagnosis not present

## 2018-05-04 DIAGNOSIS — G20 Parkinson's disease: Secondary | ICD-10-CM | POA: Diagnosis not present

## 2018-05-04 LAB — URINALYSIS, COMPLETE (UACMP) WITH MICROSCOPIC
Bacteria, UA: NONE SEEN
Bilirubin Urine: NEGATIVE
Glucose, UA: >=500 mg/dL — AB
Hgb urine dipstick: NEGATIVE
Ketones, ur: 5 mg/dL — AB
Leukocytes, UA: NEGATIVE
Nitrite: NEGATIVE
Protein, ur: NEGATIVE mg/dL
Specific Gravity, Urine: 1.03 (ref 1.005–1.030)
Squamous Epithelial / LPF: NONE SEEN (ref 0–5)
pH: 5 (ref 5.0–8.0)

## 2018-05-04 LAB — CBC
HCT: 34.8 % — ABNORMAL LOW (ref 39.0–52.0)
Hemoglobin: 11 g/dL — ABNORMAL LOW (ref 13.0–17.0)
MCH: 29.3 pg (ref 26.0–34.0)
MCHC: 31.6 g/dL (ref 30.0–36.0)
MCV: 92.8 fL (ref 80.0–100.0)
Platelets: 171 10*3/uL (ref 150–400)
RBC: 3.75 MIL/uL — ABNORMAL LOW (ref 4.22–5.81)
RDW: 14.6 % (ref 11.5–15.5)
WBC: 6.3 10*3/uL (ref 4.0–10.5)
nRBC: 0 % (ref 0.0–0.2)

## 2018-05-04 LAB — COMPREHENSIVE METABOLIC PANEL
ALT: 6 U/L (ref 0–44)
AST: 26 U/L (ref 15–41)
Albumin: 3.8 g/dL (ref 3.5–5.0)
Alkaline Phosphatase: 43 U/L (ref 38–126)
Anion gap: 10 (ref 5–15)
BUN: 15 mg/dL (ref 8–23)
CO2: 25 mmol/L (ref 22–32)
Calcium: 8.2 mg/dL — ABNORMAL LOW (ref 8.9–10.3)
Chloride: 103 mmol/L (ref 98–111)
Creatinine, Ser: 1.32 mg/dL — ABNORMAL HIGH (ref 0.61–1.24)
GFR calc Af Amer: 55 mL/min — ABNORMAL LOW (ref >60)
GFR calc non Af Amer: 47 mL/min — ABNORMAL LOW (ref >60)
Glucose, Bld: 180 mg/dL — ABNORMAL HIGH (ref 70–99)
Potassium: 3.9 mmol/L (ref 3.5–5.1)
Sodium: 138 mmol/L (ref 135–145)
Total Bilirubin: 1 mg/dL (ref 0.3–1.2)
Total Protein: 6.7 g/dL (ref 6.5–8.1)

## 2018-05-04 LAB — TROPONIN I
Troponin I: <0.03 ng/mL (ref <0.03)
Troponin I: <0.03 ng/mL (ref <0.03)

## 2018-05-04 LAB — GLUCOSE, CAPILLARY: Glucose-Capillary: 151 mg/dL — ABNORMAL HIGH (ref 70–99)

## 2018-05-04 MED ORDER — SODIUM CHLORIDE 0.9 % IV BOLUS
1000.0000 mL | Freq: Once | INTRAVENOUS | Status: AC
  Administered 2018-05-04: 1000 mL via INTRAVENOUS

## 2018-05-04 NOTE — ED Notes (Signed)
Pt states while sitting at the table after eating breakfast he had sudden onset dizziness and felt like he was going to passout, states he was seen here about 26months ago for the same but they could not figure out why he had near syncope. Pt skin is warm and dry, pale in color on arrival. Pt states he lives alone but has a caregiver there to assist him. States he uses a walker to ambulate. Pt is sometimes slow to respond but states that is his baseline due to parkinsons.

## 2018-05-04 NOTE — ED Triage Notes (Signed)
Pt comes into the ED via EMS from home with c/o near syncope this morning with a HA, states initially on EMS arrival pt was cool and diaphroetic b/p 74/40 now 121/61, CBG 235. Pt is a/ox4 on arrival.

## 2018-05-04 NOTE — ED Notes (Signed)
ORTHOSTATICS Supine: BP: 124/65 (81) HR: 61 Sitting: BP: 132/70(82) HR: 64 Standing: BP: 132/74 (93) HR: 66

## 2018-05-04 NOTE — ED Notes (Signed)
Patient verbalized understanding of discharge instructions, no questions. Patient out of ED with daughter in wheelchair in no distress.

## 2018-05-04 NOTE — Discharge Instructions (Signed)
These drink plenty of fluid to stay well-hydrated, which will also prevent lightheadedness and fainting and improve your kidney function.  Please make an appointment with Dr. Rockey Situ to review all of your heart medications.  Return to the emergency department if you develop lightheadedness or fainting, severe pain, fever, changes in mental status, or any other symptoms concerning to you.

## 2018-05-04 NOTE — ED Provider Notes (Addendum)
[facility] Emergency Department Provider Note  ____________________________________________  Time seen: Approximately 10:45 AM  I have reviewed the triage vital signs and the nursing notes.   HISTORY  Chief Complaint Near Syncope    HPI Timothy [name] is a 84 y.o. male with a history of Parkinson's disease, paroxysmal A. fib, AAA, HTN, DM, CHF presenting for presyncope.  The patient was walking with his caregiver when he had acute lightheadedness without any visual changes.  He did not have true syncope or loss of consciousness, and denies any associated chest pain, shortness of breath, palpitations.  He did have a mild headache when waking up this morning.  He denies any recent illness including nausea vomiting or diarrhea, cough or cold symptoms, fever or chills or dysuria.  He has not had any recent changes in his medications. He has been eating and drinking normally.  EMS noted him to have pallor, blood pressure to be in the 70s over 50s with a blood sugar of 238.  The patient reports a similar episode 29mo ago, without definitive cause despite work up.  Past Medical History:  Diagnosis Date  . Back pain   . CHF (congestive heart failure) ([place])   . Esophageal reflux   . 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 Senile osteoporosis   . Skin cancer   . TIA (transient ischemic attack)     Patient Active Problem List   Diagnosis Date Noted  . Aortic valve stenosis 02/18/2018  . Near syncope 01/31/2018  . Dysphagia 12/10/2017  . Advanced care planning/counseling discussion 11/27/2016  . Pernicious anemia 05/28/2015  . Leg weakness, bilateral 04/12/2015  . Tremor 03/27/2015  . Chronic anxiety 02/20/2015  . SVT (supraventricular tachycardia) ([place]) 02/16/2015  . Paroxysmal atrial fibrillation ([place]) 02/16/2015  . History of back surgery 02/16/2015  . AAA (abdominal aortic aneurysm) without rupture ([place]) 10/07/2012  . TIA  (transient ischemic attack) 12/29/2011  . HLD (hyperlipidemia) 09/24/2011  . Essential hypertension 09/24/2011  . DM type 2 (diabetes mellitus, type 2) ([place]) 09/24/2011    Past Surgical History:  Procedure Laterality Date  . APPENDECTOMY    . BACK SURGERY    . CATARACT EXTRACTION    . COLONOSCOPY  [id number]  . ESOPHAGEAL DILATION    . GALLBLADDER SURGERY    . HERNIA REPAIR    . RETINAL DETACHMENT SURGERY    . TONSILLECTOMY      Current Outpatient Rx  . Order #: [id number] Class: Historical Med  . Order #: [id number] Class: Historical Med  . Order #: [id number] Class: Historical Med  . Order #: [id number] Class: Normal  . Order #: [id number] Class: Historical Med  . Order #: [id number] Class: Normal  . Order #: [id number] Class: Normal  . Order #: [id number] Class: Historical Med  . Order #: [id number] Class: Normal  . Order #: [id number] Class: Historical Med  . Order #: [id number] Class: Normal  . Order #: [id number] Class: Print  . Order #: [id number] Class: Historical Med  . Order #: [id number] Class: Normal  . Order #: [id number] Class: Historical Med  . Order #: [id number] Class: Normal  . Order #: [id number] Class: Historical Med  . Order #: [id number] Class: Normal  . Order #: [id number] Class: Historical Med  . Order #: [id number] Class: Normal    Allergies Patient has no known allergies.  Family History  Problem Relation Age of Onset  . Heart disease Mother   . Diabetes Unknown        grandparents  . Heart disease Brother   .  Stroke Brother     Social History Social History   Tobacco Use  . Smoking status: Never Smoker  . Smokeless tobacco: Never Used  Substance Use Topics  . Alcohol use: No  . Drug use: No    Review of Systems Constitutional: No fever/chills.  Positive lightheadedness without syncope. Eyes: No visual changes.  No blurred or double vision. ENT: No sore throat. No congestion or rhinorrhea. Cardiovascular: Denies chest pain. Denies palpitations. Respiratory:  Denies shortness of breath.  No cough. Gastrointestinal: No abdominal pain.  No nausea, no vomiting.  No diarrhea.  No constipation. Genitourinary: Negative for dysuria. Musculoskeletal: Negative for back pain. Skin: Negative for rash. Neurological: Positive for frontal dull headache. No focal numbness, tingling or weakness.  No changes in vision, speech or mental status.    ____________________________________________   PHYSICAL EXAM:  VITAL SIGNS: ED Triage Vitals  Enc Vitals Group     BP 05/04/18 1026 122/65     Pulse Rate 05/04/18 1026 60     Resp 05/04/18 1026 17     Temp 05/04/18 1033 (!) 97.5 F (36.4 C)     Temp Source 05/04/18 1026 Oral     SpO2 05/04/18 1026 96 %     Weight 05/04/18 1027 235 lb (106.6 kg)     Height 05/04/18 1027 6' (1.829 m)     Head Circumference --      Peak Flow --      Pain Score 05/04/18 1031 0     Pain Loc --      Pain Edu? --      Excl. in [place]? --     Constitutional: Alert and oriented. Answers questions appropriately. Eyes: Conjunctivae are normal.  EOMI. PERRLA.  No scleral icterus. Head: Atraumatic. Nose: No congestion/rhinnorhea. Mouth/Throat: Mucous membranes are moist.  Neck: No stridor.  Supple.  No JVD.  No meningismus. Cardiovascular: Normal rate, regular rhythm. Holosystolic murmur without rubs or gallops.  Respiratory: Normal respiratory effort.  No accessory muscle use or retractions. Lungs CTAB.  No wheezes, rales or ronchi. Gastrointestinal: Soft, nontender and nondistended.  No guarding or rebound.  No peritoneal signs. Musculoskeletal: No LE edema. No ttp in the calves or palpable cords.  Negative Homan's sign. Neurologic:  A&Ox3.  Speech is clear.  Face and smile are symmetric.  EOMI.  Moves all extremities well. Skin:  Skin is warm, dry and intact. No rash noted.  Positive pallor. Psychiatric: Mood and affect are normal.   ____________________________________________   LABS (all labs ordered are listed, but only  abnormal results are displayed)  Labs Reviewed  CBC - Abnormal; Notable for the following components:      Result Value   RBC 3.75 (*)    Hemoglobin 11.0 (*)    HCT 34.8 (*)    All other components within normal limits  COMPREHENSIVE METABOLIC PANEL - Abnormal; Notable for the following components:   Glucose, Bld 180 (*)    Creatinine, Ser 1.32 (*)    Calcium 8.2 (*)    GFR calc non Af Amer 47 (*)    GFR calc Af Amer 55 (*)    All other components within normal limits  URINALYSIS, COMPLETE (UACMP) WITH MICROSCOPIC - Abnormal; Notable for the following components:   Color, Urine YELLOW (*)    APPearance CLEAR (*)    Glucose, UA >=500 (*)    Ketones, ur 5 (*)    All other components within normal limits  GLUCOSE, CAPILLARY - Abnormal; Notable for  the following components:   Glucose-Capillary 151 (*)    All other components within normal limits  TROPONIN I  TROPONIN I  CBG MONITORING, ED   ____________________________________________  EKG  ED ECG REPORT I, Anne-Caroline Mariea Clonts, the attending physician, personally viewed and interpreted this ECG.   Date: 05/04/2018  EKG Time: 1026  Rate: 60  Rhythm: normal sinus rhythm; RBBB  Axis: leftward  Intervals:first-degree A-V block   ST&T Change: No STEMI  This EKG is compared to to prior EKGs, 8/19 and 3/19, which show an old first-degree block and an old right bundle branch block; the morphology is grossly similar.  ____________________________________________  RADIOLOGY  Dg Chest 2 View  Result Date: 05/04/2018 CLINICAL DATA:  Sudden onset of dizziness. EXAM: CHEST - 2 VIEW COMPARISON:  01/31/2018.  09/08/2014. FINDINGS: Mediastinum and hilar structures normal. Heart size normal. Low lung volumes. Mild bibasilar subsegmental atelectasis. No change from prior exam. No pleural effusion or pneumothorax. Degenerative change thoracic spine. Surgical clips right upper quadrant. IMPRESSION: Low lung volumes. Mild bibasilar  subsegmental atelectasis. No change from prior exam. Electronically Signed   By: Marcello Moores  Register   On: 05/04/2018 12:56   Ct Head Wo Contrast  Result Date: 05/04/2018 CLINICAL DATA:  84 year old male with near syncope this morning. Headache. Initial encounter. EXAM: CT HEAD WITHOUT CONTRAST TECHNIQUE: Contiguous axial images were obtained from the base of the skull through the vertex without intravenous contrast. COMPARISON:  04/16/2018 brain MR. 02/16/2017 head CT. FINDINGS: Brain: No intracranial hemorrhage or CT evidence of large acute infarct. Chronic microvascular changes. Moderate global atrophy. No intracranial mass lesion noted on this unenhanced exam. Vascular: Vascular calcifications.  No hyperdense vessel. Skull: No acute abnormality. Sinuses/Orbits: Post banding left globe. Visualized paranasal sinuses are clear. Other: Visualized mastoid air cells and middle ear cavities are clear. IMPRESSION: 1. No intracranial hemorrhage or CT evidence of large acute infarct. 2. Chronic microvascular changes. 3. Global atrophy. Electronically Signed   By: Genia Del M.D.   On: 05/04/2018 11:14    ____________________________________________   PROCEDURES  Procedure(s) performed: None  Procedures  Critical Care performed: No ____________________________________________   INITIAL IMPRESSION / ASSESSMENT AND PLAN / ED COURSE  Pertinent labs & imaging results that were available during my care of the patient were reviewed by me and considered in my medical decision making (see chart for details).  84 y.o. male with multiple chronic comorbidities presenting with a presyncopal episode today.  Overall, the patient is hemodynamically stable upon arrival to the emergency department after 150 cc of intravenous fluids and Trendelenburg positioning.  Hypovolemia is possible, as well as hypotension from his chronic medications.  We will check for dehydration, UTI.  I do not see any focal neurologic  deficits on my examination but a CT head is ordered for further evaluation.  EKG is also pending.  Plan reevaluation for final disposition.  ----------------------------------------- 11:58 AM on 05/04/2018 -----------------------------------------  The patient continues to be hemodynamically stable and is feeling back to baseline at this time.  His family is here, who report he had a full syncopal event not just a presyncopal episode.  They describe that he lost consciousness and then had one episode of emesis upon regaining consciousness.  They report he has had multiple similar episodes over the last several months.  He follows with Dr. Rockey Situ, his cardiologist as well as a primary care physician.  The patient's work-up in the emergency department suggest that the patient may have had a combination of  iatrogenic hypotension from his blood pressure medications, in the setting of some mild dehydration given his renal insufficiency.  In addition, with his aortic stenosis, it is likely that he does not tolerate decreased preload and therefore he has been having the syncopal episodes.  He also may be at increased risk for autonomic dysfunction given his Parkinson's.  I do not see any evidence of other emergency condition today.  Reviewed the patient's chart, including discharge summary from 02/02/2018.  The patient was admitted to the hospital for bradycardia with bradycardia and relative hypotension and orthostasis.  His echocardiogram shows a EF of 60 to 65% with moderate aortic stenosis, which is consistent with the murmur I can hear on exam.  After that hospitalization, his digoxin and Cardizem were discontinued.  ----------------------------------------- 1:40 PM on 05/04/2018 -----------------------------------------  Patient continues to be hemodynamically stable and has not had any further hypotension in the emergency department.  His work-up in the emergency department has been reassuring.  He  has negative troponin and a second troponin is pending.  He has no evidence of ischemia or arrhythmia on his EKG.  His urine does not show UTI.  His blood counts are stable and his electrolytes are reassuring.  His creatinine is slightly elevated compared to his prior; however 1.32 is within the realm of his normal baseline.  He has received intravenous fluids for that.  At this time, the cause of the patient's hypotension and syncope is unclear but I do not see any evidence of a life-threatening condition.  I will plan to speak with Dr. Rockey Situ or his team but anticipate discharge home.  ----------------------------------------- 1:48 PM on 05/04/2018 -----------------------------------------  I have called to let Dr. Saunders Revel know about the patient's work-up.  ----------------------------------------- 2:30 PM on 05/04/2018 -----------------------------------------  Th patient continues to feel well and be asymptomatic.  I have spoken with the cardiologist on-call, who recommends confirming that the patient is no longer on digoxin.  The patient and his family member believe he discontinue that medication as instructed after discharge on 02/02/2018.  They are confirming with his home care group.  I am awaiting the results of his second troponin and anticipate discharge home.  ____________________________________________  FINAL CLINICAL IMPRESSION(S) / ED DIAGNOSES  Final diagnoses:  Syncope, unspecified syncope type  Hypotension, unspecified hypotension type  Nausea and vomiting, intractability of vomiting not specified, unspecified vomiting type  Acute renal insufficiency         NEW MEDICATIONS STARTED DURING THIS VISIT:  New Prescriptions   No medications on file      Eula Listen, MD 05/04/18 1350    Eula Listen, MD 05/04/18 1353    Eula Listen, MD 05/04/18 1430

## 2018-05-06 ENCOUNTER — Other Ambulatory Visit: Payer: Self-pay | Admitting: Cardiovascular Disease

## 2018-05-06 DIAGNOSIS — I739 Peripheral vascular disease, unspecified: Secondary | ICD-10-CM

## 2018-05-06 DIAGNOSIS — I724 Aneurysm of artery of lower extremity: Secondary | ICD-10-CM

## 2018-05-07 ENCOUNTER — Ambulatory Visit (INDEPENDENT_AMBULATORY_CARE_PROVIDER_SITE_OTHER): Payer: Medicare Other

## 2018-05-07 DIAGNOSIS — I739 Peripheral vascular disease, unspecified: Secondary | ICD-10-CM | POA: Diagnosis not present

## 2018-05-07 DIAGNOSIS — I724 Aneurysm of artery of lower extremity: Secondary | ICD-10-CM | POA: Diagnosis not present

## 2018-05-13 ENCOUNTER — Other Ambulatory Visit: Payer: Self-pay | Admitting: Family Medicine

## 2018-05-13 DIAGNOSIS — E1159 Type 2 diabetes mellitus with other circulatory complications: Secondary | ICD-10-CM

## 2018-05-13 NOTE — Telephone Encounter (Signed)
Requested medication (s) are due for refill today: yes  Requested medication (s) are on the active medication list: different dosage    Last refill:   Future visit scheduled yes  Notes to clinic: Pt taking differently  Requested Prescriptions  Pending Prescriptions Disp Refills   metoprolol succinate (TOPROL-XL) 50 MG 24 hr tablet [Pharmacy Med Name: METOPROLOL SUCCINATE ER 50 MG TAB] 30 tablet 3    Sig: TAKE ONE TABLET BY MOUTH EVERY DAY     Cardiovascular:  Beta Blockers Passed - 05/13/2018  1:40 PM      Passed - Last BP in normal range    BP Readings from Last 1 Encounters:  05/04/18 130/68         Passed - Last Heart Rate in normal range    Pulse Readings from Last 1 Encounters:  05/04/18 61         Passed - Valid encounter within last 6 months    Recent Outpatient Visits          1 month ago Essential hypertension   [place] [facility], Jeannette How, MD   3 months ago Hospital discharge follow-up   [place], [place], PA-C   5 months ago Essential hypertension   [place] Crissman, Jeannette How, MD   8 months ago Essential hypertension   [facility] Crissman, Jeannette How, MD   1 year ago Type 2 diabetes mellitus with other circulatory complication, without long-term current use of insulin ([place])   [place], Jeannette How, MD      Future Appointments            In 2 months Crissman, Jeannette How, MD [place]         Signed Prescriptions Disp Refills   JANUVIA 100 MG tablet 30 tablet 1    Sig: [place]     Endocrinology:  Diabetes - DPP-4 Inhibitors Failed - 05/13/2018  1:40 PM      Failed - Cr in normal range and within 360 days    Creatinine  Date Value Ref Range Status  12/24/2011 1.30 0.60 - 1.30 mg/dL Final   Creatinine, Ser  Date Value Ref Range Status  05/04/2018 1.32 (H) 0.61 - 1.24 mg/dL Final         Passed - HBA1C is between 0 and 7.9  and within 180 days    Hemoglobin A1C  Date Value Ref Range Status  02/21/2016 7.6  Final         Passed - Valid encounter within last 6 months    Recent Outpatient Visits          1 month ago Essential hypertension   [place] Guadalupe Maple, MD   3 months ago Hospital discharge follow-up   [place], [place], PA-C   5 months ago Essential hypertension   [place] Crissman, Jeannette How, MD   8 months ago Essential hypertension   [place], Mark A, MD   1 year ago Type 2 diabetes mellitus with other circulatory complication, without long-term current use of insulin ([place])   [place], Jeannette How, MD      Future Appointments            In 2 months Crissman, Jeannette How, MD [facility], [facility]

## 2018-05-14 ENCOUNTER — Ambulatory Visit: Payer: Medicare Other | Admitting: Nurse Practitioner

## 2018-05-15 ENCOUNTER — Other Ambulatory Visit: Payer: Self-pay

## 2018-05-15 ENCOUNTER — Inpatient Hospital Stay
Admission: EM | Admit: 2018-05-15 | Discharge: 2018-05-18 | DRG: 372 | Disposition: A | Discharge status: Home Health | Payer: Medicare Other | Attending: Internal Medicine | Admitting: Internal Medicine

## 2018-05-15 DIAGNOSIS — R197 Diarrhea, unspecified: Secondary | ICD-10-CM | POA: Diagnosis not present

## 2018-05-15 DIAGNOSIS — G20 Parkinson's disease: Secondary | ICD-10-CM | POA: Diagnosis not present

## 2018-05-15 DIAGNOSIS — I5032 Chronic diastolic (congestive) heart failure: Secondary | ICD-10-CM | POA: Diagnosis present

## 2018-05-15 DIAGNOSIS — N183 Chronic kidney disease, stage 3 (moderate): Secondary | ICD-10-CM | POA: Diagnosis present

## 2018-05-15 DIAGNOSIS — I350 Nonrheumatic aortic (valve) stenosis: Secondary | ICD-10-CM | POA: Diagnosis present

## 2018-05-15 DIAGNOSIS — E1122 Type 2 diabetes mellitus with diabetic chronic kidney disease: Secondary | ICD-10-CM | POA: Diagnosis present

## 2018-05-15 DIAGNOSIS — Z79899 Other long term (current) drug therapy: Secondary | ICD-10-CM

## 2018-05-15 DIAGNOSIS — Z8673 Personal history of transient ischemic attack (TIA), and cerebral infarction without residual deficits: Secondary | ICD-10-CM

## 2018-05-15 DIAGNOSIS — E876 Hypokalemia: Secondary | ICD-10-CM | POA: Diagnosis present

## 2018-05-15 DIAGNOSIS — M810 Age-related osteoporosis without current pathological fracture: Secondary | ICD-10-CM | POA: Diagnosis present

## 2018-05-15 DIAGNOSIS — F419 Anxiety disorder, unspecified: Secondary | ICD-10-CM | POA: Diagnosis present

## 2018-05-15 DIAGNOSIS — R69 Illness, unspecified: Secondary | ICD-10-CM | POA: Diagnosis not present

## 2018-05-15 DIAGNOSIS — A045 Campylobacter enteritis: Principal | ICD-10-CM | POA: Diagnosis present

## 2018-05-15 DIAGNOSIS — Z7984 Long term (current) use of oral hypoglycemic drugs: Secondary | ICD-10-CM

## 2018-05-15 DIAGNOSIS — K529 Noninfective gastroenteritis and colitis, unspecified: Secondary | ICD-10-CM | POA: Diagnosis present

## 2018-05-15 DIAGNOSIS — Z85828 Personal history of other malignant neoplasm of skin: Secondary | ICD-10-CM

## 2018-05-15 DIAGNOSIS — E785 Hyperlipidemia, unspecified: Secondary | ICD-10-CM | POA: Diagnosis present

## 2018-05-15 DIAGNOSIS — I480 Paroxysmal atrial fibrillation: Secondary | ICD-10-CM | POA: Diagnosis not present

## 2018-05-15 DIAGNOSIS — M889 Osteitis deformans of unspecified bone: Secondary | ICD-10-CM | POA: Diagnosis present

## 2018-05-15 DIAGNOSIS — Z7901 Long term (current) use of anticoagulants: Secondary | ICD-10-CM

## 2018-05-15 DIAGNOSIS — Z823 Family history of stroke: Secondary | ICD-10-CM

## 2018-05-15 DIAGNOSIS — E860 Dehydration: Secondary | ICD-10-CM | POA: Diagnosis not present

## 2018-05-15 DIAGNOSIS — M6281 Muscle weakness (generalized): Secondary | ICD-10-CM | POA: Diagnosis not present

## 2018-05-15 DIAGNOSIS — K219 Gastro-esophageal reflux disease without esophagitis: Secondary | ICD-10-CM | POA: Diagnosis present

## 2018-05-15 DIAGNOSIS — R531 Weakness: Secondary | ICD-10-CM | POA: Diagnosis not present

## 2018-05-15 LAB — COMPREHENSIVE METABOLIC PANEL
ALT: 7 U/L (ref 0–44)
AST: 31 U/L (ref 15–41)
Albumin: 3.8 g/dL (ref 3.5–5.0)
Alkaline Phosphatase: 51 U/L (ref 38–126)
Anion gap: 9 (ref 5–15)
BUN: 12 mg/dL (ref 8–23)
CO2: 25 mmol/L (ref 22–32)
Calcium: 8.7 mg/dL — ABNORMAL LOW (ref 8.9–10.3)
Chloride: 103 mmol/L (ref 98–111)
Creatinine, Ser: 1.35 mg/dL — ABNORMAL HIGH (ref 0.61–1.24)
GFR calc Af Amer: 53 mL/min — ABNORMAL LOW (ref >60)
GFR calc non Af Amer: 46 mL/min — ABNORMAL LOW (ref >60)
Glucose, Bld: 157 mg/dL — ABNORMAL HIGH (ref 70–99)
Potassium: 3.4 mmol/L — ABNORMAL LOW (ref 3.5–5.1)
Sodium: 137 mmol/L (ref 135–145)
Total Bilirubin: 0.8 mg/dL (ref 0.3–1.2)
Total Protein: 7.4 g/dL (ref 6.5–8.1)

## 2018-05-15 LAB — CBC
HCT: 39.6 % (ref 39.0–52.0)
Hemoglobin: 12.4 g/dL — ABNORMAL LOW (ref 13.0–17.0)
MCH: 28.6 pg (ref 26.0–34.0)
MCHC: 31.3 g/dL (ref 30.0–36.0)
MCV: 91.5 fL (ref 80.0–100.0)
Platelets: 163 10*3/uL (ref 150–400)
RBC: 4.33 MIL/uL (ref 4.22–5.81)
RDW: 15 % (ref 11.5–15.5)
WBC: 9.8 10*3/uL (ref 4.0–10.5)
nRBC: 0 % (ref 0.0–0.2)

## 2018-05-15 LAB — GASTROINTESTINAL PANEL BY PCR, STOOL (REPLACES STOOL CULTURE)

## 2018-05-15 LAB — LACTIC ACID, PLASMA
Lactic Acid, Venous: 2.2 mmol/L (ref 0.5–1.9)
Lactic Acid, Venous: 1.7 mmol/L (ref 0.5–1.9)

## 2018-05-15 LAB — C DIFFICILE QUICK SCREEN W PCR REFLEX
C Diff antigen: NEGATIVE
C Diff interpretation: NOT DETECTED
C Diff toxin: NEGATIVE

## 2018-05-15 LAB — LIPASE, BLOOD: Lipase: 24 U/L (ref 11–51)

## 2018-05-15 LAB — GLUCOSE, CAPILLARY: Glucose-Capillary: 108 mg/dL — ABNORMAL HIGH (ref 70–99)

## 2018-05-15 LAB — DIGOXIN LEVEL: Digoxin Level: <0.2 ng/mL — ABNORMAL LOW (ref 0.8–2.0)

## 2018-05-15 LAB — TROPONIN I: Troponin I: <0.03 ng/mL (ref <0.03)

## 2018-05-15 MED ORDER — APIXABAN 5 MG PO TABS
5.0000 mg | ORAL_TABLET | Freq: Two times a day (BID) | ORAL | Status: DC
  Administered 2018-05-15 – 2018-05-18 (×6): 5 mg via ORAL
  Filled 2018-05-15 (×6): qty 1

## 2018-05-15 MED ORDER — SODIUM CHLORIDE 0.9 % IV SOLN
INTRAVENOUS | Status: DC
  Administered 2018-05-15 – 2018-05-18 (×5): via INTRAVENOUS

## 2018-05-15 MED ORDER — AZITHROMYCIN 250 MG PO TABS
500.0000 mg | ORAL_TABLET | Freq: Every day | ORAL | Status: AC
  Administered 2018-05-15 – 2018-05-17 (×3): 500 mg via ORAL
  Filled 2018-05-15 (×3): qty 2

## 2018-05-15 MED ORDER — CARBIDOPA-LEVODOPA 25-100 MG PO TABS
1.0000 | ORAL_TABLET | Freq: Four times a day (QID) | ORAL | Status: DC
  Administered 2018-05-15 – 2018-05-18 (×11): 1 via ORAL
  Filled 2018-05-15 (×13): qty 1

## 2018-05-15 MED ORDER — ACETAMINOPHEN 325 MG PO TABS
650.0000 mg | ORAL_TABLET | Freq: Four times a day (QID) | ORAL | Status: DC | PRN
  Administered 2018-05-15: 650 mg via ORAL
  Filled 2018-05-15: qty 2

## 2018-05-15 MED ORDER — PANTOPRAZOLE SODIUM 40 MG PO TBEC
40.0000 mg | DELAYED_RELEASE_TABLET | Freq: Two times a day (BID) | ORAL | Status: DC
  Administered 2018-05-15 – 2018-05-18 (×6): 40 mg via ORAL
  Filled 2018-05-15 (×7): qty 1

## 2018-05-15 MED ORDER — DIGOXIN 125 MCG PO TABS
0.1250 mg | ORAL_TABLET | Freq: Every day | ORAL | Status: DC
  Administered 2018-05-15 – 2018-05-17 (×3): 0.125 mg via ORAL
  Filled 2018-05-15 (×3): qty 1

## 2018-05-15 MED ORDER — SODIUM CHLORIDE 0.9 % IV BOLUS
500.0000 mL | Freq: Once | INTRAVENOUS | Status: AC
  Administered 2018-05-15: 500 mL via INTRAVENOUS

## 2018-05-15 MED ORDER — INSULIN ASPART 100 UNIT/ML ~~LOC~~ SOLN
0.0000 [IU] | Freq: Three times a day (TID) | SUBCUTANEOUS | Status: DC
  Administered 2018-05-16 – 2018-05-17 (×3): 3 [IU] via SUBCUTANEOUS
  Administered 2018-05-17 (×2): 2 [IU] via SUBCUTANEOUS
  Administered 2018-05-18: 3 [IU] via SUBCUTANEOUS
  Administered 2018-05-18: 1 [IU] via SUBCUTANEOUS
  Filled 2018-05-15 (×6): qty 1

## 2018-05-15 MED ORDER — SERTRALINE HCL 50 MG PO TABS
50.0000 mg | ORAL_TABLET | Freq: Every day | ORAL | Status: DC
  Filled 2018-05-15 (×2): qty 1

## 2018-05-15 MED ORDER — METOPROLOL SUCCINATE ER 50 MG PO TB24
50.0000 mg | ORAL_TABLET | Freq: Every day | ORAL | Status: DC
  Administered 2018-05-15 – 2018-05-18 (×3): 50 mg via ORAL
  Filled 2018-05-15 (×3): qty 1

## 2018-05-15 MED ORDER — TRIAMCINOLONE ACETONIDE 0.5 % EX OINT
TOPICAL_OINTMENT | Freq: Two times a day (BID) | CUTANEOUS | Status: DC
  Administered 2018-05-15 – 2018-05-16 (×2): via TOPICAL
  Administered 2018-05-16: 1 via TOPICAL
  Administered 2018-05-17 – 2018-05-18 (×3): via TOPICAL
  Filled 2018-05-15: qty 15

## 2018-05-15 MED ORDER — ONDANSETRON HCL 4 MG PO TABS
4.0000 mg | ORAL_TABLET | Freq: Four times a day (QID) | ORAL | Status: DC | PRN
  Administered 2018-05-15: 4 mg via ORAL
  Filled 2018-05-15: qty 1

## 2018-05-15 MED ORDER — INSULIN ASPART 100 UNIT/ML ~~LOC~~ SOLN
0.0000 [IU] | Freq: Every day | SUBCUTANEOUS | Status: DC
  Administered 2018-05-16: 2 [IU] via SUBCUTANEOUS
  Filled 2018-05-15: qty 1

## 2018-05-15 MED ORDER — FLECAINIDE ACETATE 50 MG PO TABS
50.0000 mg | ORAL_TABLET | Freq: Two times a day (BID) | ORAL | Status: DC
  Administered 2018-05-15 – 2018-05-18 (×6): 50 mg via ORAL
  Filled 2018-05-15 (×7): qty 1

## 2018-05-15 MED ORDER — ACETAMINOPHEN 650 MG RE SUPP: 650.0000 mg | Freq: Four times a day (QID) | RECTAL | Status: DC | PRN

## 2018-05-15 MED ORDER — POTASSIUM CHLORIDE CRYS ER 20 MEQ PO TBCR
40.0000 meq | EXTENDED_RELEASE_TABLET | Freq: Once | ORAL | Status: AC
  Administered 2018-05-15: 40 meq via ORAL
  Filled 2018-05-15: qty 2

## 2018-05-15 MED ORDER — ROSUVASTATIN CALCIUM 10 MG PO TABS
10.0000 mg | ORAL_TABLET | Freq: Every day | ORAL | Status: DC
  Administered 2018-05-16 – 2018-05-18 (×3): 10 mg via ORAL
  Filled 2018-05-15 (×3): qty 1

## 2018-05-15 MED ORDER — ONDANSETRON HCL 4 MG/2ML IJ SOLN: 4.0000 mg | Freq: Four times a day (QID) | INTRAMUSCULAR | Status: DC | PRN

## 2018-05-15 NOTE — H&P (Signed)
[facility] [place] NAME: Timothy [name]    MR#:  [id number]  DATE OF BIRTH:  04-08-1929  DATE OF ADMISSION:  05/15/2018  PRIMARY CARE PHYSICIAN: Guadalupe Maple, MD   REQUESTING/REFERRING PHYSICIAN: Charlotte Crumb, MD  CHIEF COMPLAINT:   Chief Complaint  Patient presents with  . Weakness  . Diarrhea    HISTORY OF PRESENT ILLNESS:  Timothy [name]  is a 84 y.o. male with a known history of chronic diastolic heart failure, Paget's disease, osteoporosis, history TIA, paroxysmal atrial fibrillation, type 2 diabetes, Parkinson's disease, and moderate aortic stenosis who presented to the ED with multiple episodes of diarrhea over the last 2 days.  He states he has had about 6 episodes of diarrhea total.  He denies any hematochezia or melena.  He just returned from a trip to the mountains.  No other sick contacts.  No nausea, no vomiting, no abdominal pain.  He notes decreased appetite starting today.  In the ED, vitals were unremarkable.  Labs were significant for K 3.4, lactic acid 2.2.  Stool pathogen panel positive for Campylobacter.  Hospitalists were called for admission.  PAST MEDICAL HISTORY:   Past Medical History:  Diagnosis Date  . Back pain   . CHF (congestive heart failure) ([place])   . Esophageal reflux   . 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 Senile osteoporosis   . Skin cancer   . TIA (transient ischemic attack)     PAST SURGICAL HISTORY:   Past Surgical History:  Procedure Laterality Date  . APPENDECTOMY    . BACK SURGERY    . CATARACT EXTRACTION    . COLONOSCOPY  [id number]  . ESOPHAGEAL DILATION    . GALLBLADDER SURGERY    . HERNIA REPAIR    . RETINAL DETACHMENT SURGERY    . TONSILLECTOMY      SOCIAL HISTORY:   Social History   Tobacco Use  . Smoking status: Never Smoker  . Smokeless tobacco: Never Used  Substance Use Topics  . Alcohol use: No    FAMILY HISTORY:   Family  History  Problem Relation Age of Onset  . Heart disease Mother   . Diabetes Unknown        grandparents  . Heart disease Brother   . Stroke Brother     DRUG ALLERGIES:  No Known Allergies  REVIEW OF SYSTEMS:   Review of Systems  Constitutional: Negative for chills and fever.  HENT: Negative for congestion and sore throat.   Eyes: Negative for blurred vision and double vision.  Respiratory: Negative for cough and shortness of breath.   Cardiovascular: Negative for chest pain, palpitations and leg swelling.  Gastrointestinal: Positive for diarrhea. Negative for abdominal pain, blood in stool, melena, nausea and vomiting.  Genitourinary: Negative for dysuria, frequency and urgency.  Musculoskeletal: Negative for back pain and neck pain.  Skin: Positive for itching.  Neurological: Negative for dizziness and headaches.  Psychiatric/Behavioral: Negative for depression. The patient is not nervous/anxious.     MEDICATIONS AT HOME:   Prior to Admission medications   Medication Sig Start Date End Date Taking? Authorizing Provider  acetaminophen (TYLENOL) 500 MG tablet Take 1,000 mg by mouth every 8 (eight) hours as needed for mild pain or moderate pain.    Yes [provider]  Calcium Citrate-Vitamin D (CALCIUM + D PO) Take 1 tablet by mouth 2 (two) times daily.  Yes [provider]  Cannabidiol 100 MG/ML SOLN Take by mouth as directed.   Yes [provider]  carbidopa-levodopa (SINEMET) 25-100 MG tablet Take 1 tablet by mouth 4 (four) times daily.    Yes [provider]  cyanocobalamin (,VITAMIN B-12,) 1000 MCG/ML injection INJECT 1 ML INTO THE MUSCLE EVERY 30 DAYS Patient taking differently: Inject 1,000 mcg into the muscle every 30 (thirty) days.  12/16/17  Yes Crissman, Jeannette How, MD  digoxin (DIGOX) 0.125 MG tablet Take 0.125 mg by mouth daily.    Yes [provider]  ELIQUIS 5 MG TABS tablet TAKE 1 TABLET BY MOUTH TWICE DAILY Patient  taking differently: Take 5 mg by mouth 2 (two) times daily.  02/19/18  Yes Crissman, Jeannette How, MD  FARXIGA 10 MG TABS tablet TAKE ONE TABLET BY MOUTH EVERY DAY Patient taking differently: Take 10 mg by mouth daily.  02/08/18  Yes Crissman, Jeannette How, MD  flecainide (TAMBOCOR) 50 MG tablet Take 50 mg by mouth 2 (two) times daily.   Yes [provider]  glipiZIDE-metformin (METAGLIP) 5-500 MG tablet Take 1 tablet by mouth 2 (two) times daily. 10/06/17  Yes Johnson, Megan P, DO  JANUVIA 100 MG tablet TAKE ONE TABLET BY MOUTH EVERY DAY Patient taking differently: Take 100 mg by mouth daily.  05/13/18  Yes Crissman, Jeannette How, MD  metoprolol succinate (TOPROL-XL) 50 MG 24 hr tablet TAKE ONE TABLET BY MOUTH EVERY DAY Patient taking differently: Take 50 mg by mouth daily.  05/13/18  Yes Crissman, Jeannette How, MD  Multiple Vitamins-Minerals (CEROVITE SENIOR PO) Take 1 tablet by mouth daily.   Yes [provider]  pantoprazole (PROTONIX) 40 MG tablet Take 1 tablet (40 mg total) by mouth 2 (two) times daily. 12/14/17  Yes Crissman, Jeannette How, MD  Probiotic Product (Aragon) Take 1 capsule by mouth daily.    Yes [provider]  rosuvastatin (CRESTOR) 10 MG tablet Take 1 tablet (10 mg total) by mouth daily. 12/14/17  Yes Crissman, Jeannette How, MD  sertraline (ZOLOFT) 50 MG tablet Take 50 mg by mouth daily.  03/26/18  Yes [provider]  sitaGLIPtin (JANUVIA) 100 MG tablet Take 1 tablet (100 mg total) by mouth daily. 12/14/17  Yes Crissman, Jeannette How, MD  torsemide (DEMADEX) 20 MG tablet Take 1 tablet (20 mg total) by mouth daily as needed. 04/09/17  Yes [name], MD  metoprolol succinate (TOPROL-XL) 25 MG 24 hr tablet Take 1 tablet (25 mg total) by mouth daily. Take with or immediately following a meal. Patient not taking: Reported on 05/15/2018 02/19/18   [name], MD  mometasone (ELOCON) 0.1 % cream Apply 1 application topically daily. Patient not taking: Reported on  05/15/2018 01/15/18   Johnn Hai, PA-C  triamcinolone cream (KENALOG) 0.1 % Apply 1 application topically 2 (two) times daily. Patient not taking: Reported on 05/15/2018 12/15/17   Guadalupe Maple, MD      VITAL SIGNS:  Blood pressure 111/61, pulse 85, temperature 98.4 F (36.9 C), resp. rate (!) 24, weight 77.1 kg, SpO2 95 %.  PHYSICAL EXAMINATION:  Physical Exam  GENERAL:  84 y.o.-year-old patient lying in the bed with no acute distress.  EYES: Pupils equal, round, reactive to light and accommodation. No scleral icterus. Extraocular muscles intact.  HEENT: Head atraumatic, normocephalic. Oropharynx and nasopharynx clear.  NECK:  Supple, no jugular venous distention. No thyroid enlargement, no tenderness.  LUNGS: Normal breath sounds bilaterally, no  wheezing, rales,rhonchi or crepitation. No use of accessory muscles of respiration.  CARDIOVASCULAR: RRR, S1, S2 normal. No rubs or gallops. +III/VI systolic murmur. ABDOMEN: Soft, nontender, nondistended. Bowel sounds present. No organomegaly or mass.  EXTREMITIES: No pedal edema, cyanosis, or clubbing.  NEUROLOGIC: Cranial nerves II through XII are intact. Muscle strength 5/5 in all extremities. Sensation intact. Gait not checked.  PSYCHIATRIC: The patient is alert and oriented x 3.  SKIN: No obvious lesion or ulcer. +erythema on top of left shoulder.  LABORATORY PANEL:   CBC Recent Labs  Lab 05/15/18 1427  WBC 9.8  HGB 12.4*  HCT 39.6  PLT 163   ------------------------------------------------------------------------------------------------------------------  Chemistries  Recent Labs  Lab 05/15/18 1427  NA 137  K 3.4*  CL 103  CO2 25  GLUCOSE 157*  BUN 12  CREATININE 1.35*  CALCIUM 8.7*  AST 31  ALT 7  ALKPHOS 51  BILITOT 0.8   ------------------------------------------------------------------------------------------------------------------  Cardiac Enzymes Recent Labs  Lab 05/15/18 1445  TROPONINI  <0.03   ------------------------------------------------------------------------------------------------------------------  RADIOLOGY:  No results found.    IMPRESSION AND PLAN:   Campylobacter gastroenteritis- stool pathogen panel positive for Campylobacter. -Azithromycin 500 mg daily for 3 days or until symptoms have improved -Gentle IV fluids -Start clear liquid diet and advance as tolerated  Hypokalemia- K 3.4 -Replete -Recheck in the morning  Paroxysmal atrial fibrillation- in normal sinus rhythm. -Continue home digoxin, flecainide, metoprolol, eliquis -Check digoxin level  Chronic diastolic congestive heart failure- stable, no signs of volume overload -Takes torsemide prn at home, will hold for now  Type 2 diabetes- takes oral medications at home -Sensitive SSI  CKD 3- creatinine at baseline -Avoid nephrotoxic agents  Hyperlipidemia- stable -Continue home Crestor  Anxiety-stable -Continue home Zoloft  Parkinson's disease- stable, follows with neurologist at [place] home Sinemet  Poor social situation- son is concerned that patient's new girlfriend and girlfriend's children are stealing patients money.  Patient's son requesting UDS. -UDS pending  All the records are reviewed and case discussed with ED provider. Management plans discussed with the patient, family and they are in agreement.  CODE STATUS: DNI  TOTAL TIME TAKING CARE OF THIS PATIENT: 45 minutes.    Berna Spare Abia Monaco M.D on 05/15/2018 at 6:14 PM  Between 7am to 6pm - Pager - [phone]  After 6pm go to [email or web address] - Proofreader  [facility]  Office  [phone]  CC: Primary care physician; Guadalupe Maple, MD   Note: This dictation was prepared with Dragon dictation along with smaller phrase technology. Any transcriptional errors that result from this process are unintentional.

## 2018-05-15 NOTE — Progress Notes (Signed)
Family Meeting Note  Advance Directive:yes  Today a meeting took place with the Patient and son.  Patient is able to participate.  The following clinical team members were present during this meeting:MD  The following were discussed: Patient's progosis: Unable to determine and Goals for treatment: DNI   Discussed patient's chronic medical conditions, including Parkinson's disease and moderate. Discussed that patient would likely not do well with aggressive medical interventions like CPR and intubation due to his age. Patient would like to be DNI at this time. He will continue to think about it and will discuss further with his son.  Additional follow-up to be provided: prn  Time spent during discussion:20 minutes  Evette Doffing, MD

## 2018-05-15 NOTE — ED Provider Notes (Signed)
[facility] Emergency Department Provider Note ____________________________________________   I have reviewed the triage vital signs and the triage nursing note.  HISTORY  Chief Complaint Weakness and Diarrhea   Historian Patient and significant other  HPI Timothy [name] is a 84 y.o. male with afib and diabetes, presents to ER by ems for diarrhea x 2 days.  Watery, nonbloody.  Mild abdominal cramping without any significant abdominal pain.  Mild nausea without vomiting.  No fevers.  No chest pain or trouble breathing.  No dizziness or passing out.  Positive for moderate generalized weakness.  They have been traveling to the mountains, but no known specific bad food exposures, or sick contacts.  Denies recent antibiotic use or history of C. difficile.     Past Medical History:  Diagnosis Date  . Back pain   . CHF (congestive heart failure) ([place])   . Esophageal reflux   . 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 Senile osteoporosis   . Skin cancer   . TIA (transient ischemic attack)     Patient Active Problem List   Diagnosis Date Noted  . Aortic valve stenosis 02/18/2018  . Near syncope 01/31/2018  . Dysphagia 12/10/2017  . Advanced care planning/counseling discussion 11/27/2016  . Pernicious anemia 05/28/2015  . Leg weakness, bilateral 04/12/2015  . Tremor 03/27/2015  . Chronic anxiety 02/20/2015  . SVT (supraventricular tachycardia) ([place]) 02/16/2015  . Paroxysmal atrial fibrillation ([place]) 02/16/2015  . History of back surgery 02/16/2015  . AAA (abdominal aortic aneurysm) without rupture ([place]) 10/07/2012  . TIA (transient ischemic attack) 12/29/2011  . HLD (hyperlipidemia) 09/24/2011  . Essential hypertension 09/24/2011  . DM type 2 (diabetes mellitus, type 2) ([place]) 09/24/2011    Past Surgical History:  Procedure Laterality Date  . APPENDECTOMY    . BACK SURGERY    . CATARACT EXTRACTION    . COLONOSCOPY   [id number]  . ESOPHAGEAL DILATION    . GALLBLADDER SURGERY    . HERNIA REPAIR    . RETINAL DETACHMENT SURGERY    . TONSILLECTOMY      Prior to Admission medications   Medication Sig Start Date End Date Taking? Authorizing Provider  acetaminophen (TYLENOL) 500 MG tablet Take 1,000 mg by mouth every 8 (eight) hours as needed.     [provider]  Calcium Citrate-Vitamin D (CALCIUM + D PO) Take 1 tablet by mouth 2 (two) times daily.     [provider]  carbidopa-levodopa (SINEMET) 25-100 MG tablet Take 1 tablet by mouth 4 (four) times daily.     [provider]  cyanocobalamin (,VITAMIN B-12,) 1000 MCG/ML injection INJECT 1 ML INTO THE MUSCLE EVERY 30 DAYS Patient taking differently: Inject 1,000 mcg into the muscle every 30 (thirty) days.  12/16/17   Guadalupe Maple, MD  digoxin (DIGOX) 0.125 MG tablet Take 0.125 mg by mouth daily.     [provider]  ELIQUIS 5 MG TABS tablet TAKE 1 TABLET BY MOUTH TWICE DAILY Patient taking differently: Take 5 mg by mouth 2 (two) times daily.  02/19/18   Crissman, Jeannette How, MD  FARXIGA 10 MG TABS tablet TAKE ONE TABLET BY MOUTH EVERY DAY Patient taking differently: Take 10 mg by mouth daily.  02/08/18   Guadalupe Maple, MD  fluorouracil (EFUDEX) 5 % cream Apply topically. 06/18/17 06/18/18  [provider]  glipiZIDE-metformin (METAGLIP) 5-500 MG tablet Take 1 tablet by mouth 2 (two) times daily.  10/06/17   Johnson, Megan P, DO  GLUCOSAMINE-CHONDROITIN-VIT D3 PO Take by mouth daily.    [provider]  JANUVIA 100 MG tablet TAKE ONE TABLET BY MOUTH EVERY DAY 05/13/18   Guadalupe Maple, MD  metoprolol succinate (TOPROL-XL) 25 MG 24 hr tablet Take 1 tablet (25 mg total) by mouth daily. Take with or immediately following a meal. Patient taking differently: Take 12.5 mg by mouth daily. Take with or immediately following a meal. 02/19/18   Gollan, Kathlene November, MD  metoprolol succinate (TOPROL-XL) 50 MG 24 hr tablet  TAKE ONE TABLET BY MOUTH EVERY DAY 05/13/18   Guadalupe Maple, MD  mometasone (ELOCON) 0.1 % cream Apply 1 application topically daily. 01/15/18   Johnn Hai, PA-C  Multiple Vitamins-Minerals (CEROVITE SENIOR PO) Take 1 tablet by mouth daily.    [provider]  pantoprazole (PROTONIX) 40 MG tablet Take 1 tablet (40 mg total) by mouth 2 (two) times daily. 12/14/17   Guadalupe Maple, MD  Probiotic Product (PHILLIPS COLON HEALTH PO) Take 1 capsule by mouth daily.     [provider]  rosuvastatin (CRESTOR) 10 MG tablet Take 1 tablet (10 mg total) by mouth daily. 12/14/17   Guadalupe Maple, MD  sertraline (ZOLOFT) 50 MG tablet  03/26/18   [provider]  sitaGLIPtin (JANUVIA) 100 MG tablet Take 1 tablet (100 mg total) by mouth daily. 12/14/17   Guadalupe Maple, MD  torsemide (DEMADEX) 20 MG tablet Take 1 tablet (20 mg total) by mouth daily as needed. 04/09/17   [name], MD  triamcinolone cream (KENALOG) 0.1 % Apply 1 application topically 2 (two) times daily. 12/15/17   Guadalupe Maple, MD    No Known Allergies  Family History  Problem Relation Age of Onset  . Heart disease Mother   . Diabetes Unknown        grandparents  . Heart disease Brother   . Stroke Brother     Social History Social History   Tobacco Use  . Smoking status: Never Smoker  . Smokeless tobacco: Never Used  Substance Use Topics  . Alcohol use: No  . Drug use: No    Review of Systems  Constitutional: Negative for fever. Eyes: Negative for visual changes. ENT: Negative for sore throat. Cardiovascular: Negative for chest pain. Respiratory: Negative for shortness of breath. Gastrointestinal: Positive for watery diarrhea as per HPI. Genitourinary: Negative for dysuria. Musculoskeletal: Negative for back pain. Skin: Negative for rash. Neurological: Negative for headache.  ____________________________________________   PHYSICAL EXAM:  VITAL SIGNS: ED Triage Vitals   Enc Vitals Group     BP 05/15/18 1424 134/75     Pulse Rate 05/15/18 1424 85     Resp 05/15/18 1424 14     Temp 05/15/18 1424 98.4 F (36.9 C)     Temp src --      SpO2 05/15/18 1424 95 %     Weight 05/15/18 1425 170 lb (77.1 kg)     Height --      Head Circumference --      Peak Flow --      Pain Score 05/15/18 1425 8     Pain Loc --      Pain Edu? --      Excl. in [place]? --      Constitutional: Alert and oriented.  HEENT      Head: Normocephalic and atraumatic.      Eyes: Conjunctivae are normal. Pupils  equal and round.       Ears:         Nose: No congestion/rhinnorhea.      Mouth/Throat: Mucous membranes are moderately dry.      Neck: No stridor. Cardiovascular/Chest: Normal rate, regular rhythm.  No murmurs, rubs, or gallops. Respiratory: Normal respiratory effort without tachypnea nor retractions. Breath sounds are clear and equal bilaterally. No wheezes/rales/rhonchi. Gastrointestinal: Soft. No distention, no guarding, no rebound. Nontender.    Genitourinary/rectal:Deferred Musculoskeletal: Nontender with normal range of motion in all extremities. No joint effusions.  No lower extremity tenderness.  No edema. Neurologic:  Normal speech and language. No gross or focal neurologic deficits are appreciated. Skin:  Skin is warm, dry and intact. No rash noted. Psychiatric: Mood and affect are normal. Speech and behavior are normal. Patient exhibits appropriate insight and judgment.   ____________________________________________  LABS (pertinent positives/negatives) I, Lisa Roca, MD the attending physician have reviewed the labs noted below.  Labs Reviewed  GASTROINTESTINAL PANEL BY PCR, STOOL (REPLACES STOOL CULTURE)  C DIFFICILE QUICK SCREEN W PCR REFLEX  LIPASE, BLOOD  COMPREHENSIVE METABOLIC PANEL  CBC  LACTIC ACID, PLASMA  LACTIC ACID, PLASMA  TROPONIN I    ____________________________________________    EKG I, Lisa Roca, MD, the attending  physician have personally viewed and interpreted all ECGs.  83 bpm.  Normal sinus rhythm.  Nonspecific intraventricular conduction delay.  Nonspecific T wave ____________________________________________  RADIOLOGY   None __________________________________________  PROCEDURES  Procedure(s) performed: None  Procedures  Critical Care performed: None   ____________________________________________  ED COURSE / ASSESSMENT AND PLAN  Pertinent labs & imaging results that were available during my care of the patient were reviewed by me and considered in my medical decision making (see chart for details).    Stable vital signs on arrival, no tachycardia or hypertension, however patient looks pale and has generalized weakness with reporting 2 days of multiple episodes of watery diarrhea.  Does not sound like he has any specific high risk for C. difficile, but he did have a bowel movement here, will send for C. difficile as well as the bio fire testing.  Laboratory studies sent off.  Patient started on small fluid bolus for clinical dehydration.  No acute abdomen on exam, really nontender when I press in the abdomen.  Patient care to be transferred to Dr. Burlene Arnt at shift change.  Disposition per pending labs and reevaluation.      Patient / Family / Caregiver informed of clinical course, medical decision-making process, and agree with plan.     ___________________________________________   FINAL CLINICAL IMPRESSION(S) / ED DIAGNOSES   Final diagnoses:  Diarrhea, unspecified type      ___________________________________________         Note: This dictation was prepared with Dragon dictation. Any transcriptional errors that result from this process are unintentional    Lisa Roca, MD 05/15/18 1452

## 2018-05-15 NOTE — ED Triage Notes (Signed)
Pt presents via EMS c/o weakness, and diarrhea. EMS reports strong malodorous stool.

## 2018-05-15 NOTE — ED Notes (Signed)
Admitting Provider at bedside. 

## 2018-05-15 NOTE — ED Provider Notes (Signed)
-----------------------------------------   4:00 PM on 05/15/2018 ------- Signed out to me at this time. Pt w significant diarrhea. Getting ivf. Does not appear septic. abd benign. Per signout eval after cdiff results and ivf. Lactic borderline.   Schuyler Amor, MD 05/15/18 1601 ----------------------------------    Schuyler Amor, MD 05/15/18 [phone]

## 2018-05-16 DIAGNOSIS — E876 Hypokalemia: Secondary | ICD-10-CM | POA: Diagnosis not present

## 2018-05-16 DIAGNOSIS — I480 Paroxysmal atrial fibrillation: Secondary | ICD-10-CM | POA: Diagnosis not present

## 2018-05-16 DIAGNOSIS — I5032 Chronic diastolic (congestive) heart failure: Secondary | ICD-10-CM | POA: Diagnosis not present

## 2018-05-16 DIAGNOSIS — A045 Campylobacter enteritis: Secondary | ICD-10-CM | POA: Diagnosis not present

## 2018-05-16 LAB — BASIC METABOLIC PANEL
Anion gap: 9 (ref 5–15)
BUN: 16 mg/dL (ref 8–23)
CO2: 22 mmol/L (ref 22–32)
Calcium: 8.4 mg/dL — ABNORMAL LOW (ref 8.9–10.3)
Chloride: 105 mmol/L (ref 98–111)
Creatinine, Ser: 1.18 mg/dL (ref 0.61–1.24)
GFR calc Af Amer: >60 mL/min (ref >60)
GFR calc non Af Amer: 54 mL/min — ABNORMAL LOW (ref >60)
Glucose, Bld: 103 mg/dL — ABNORMAL HIGH (ref 70–99)
Potassium: 3.8 mmol/L (ref 3.5–5.1)
Sodium: 136 mmol/L (ref 135–145)

## 2018-05-16 LAB — CBC
HCT: 39.4 % (ref 39.0–52.0)
Hemoglobin: 12.1 g/dL — ABNORMAL LOW (ref 13.0–17.0)
MCH: 28.7 pg (ref 26.0–34.0)
MCHC: 30.7 g/dL (ref 30.0–36.0)
MCV: 93.6 fL (ref 80.0–100.0)
Platelets: 151 10*3/uL (ref 150–400)
RBC: 4.21 MIL/uL — ABNORMAL LOW (ref 4.22–5.81)
RDW: 15.1 % (ref 11.5–15.5)
WBC: 7.3 10*3/uL (ref 4.0–10.5)
nRBC: 0 % (ref 0.0–0.2)

## 2018-05-16 LAB — GLUCOSE, CAPILLARY
Glucose-Capillary: 86 mg/dL (ref 70–99)
Glucose-Capillary: 216 mg/dL — ABNORMAL HIGH (ref 70–99)
Glucose-Capillary: 238 mg/dL — ABNORMAL HIGH (ref 70–99)
Glucose-Capillary: 228 mg/dL — ABNORMAL HIGH (ref 70–99)

## 2018-05-16 LAB — URINE DRUG SCREEN, QUALITATIVE (ARMC ONLY)
Amphetamines, Ur Screen: NOT DETECTED
Barbiturates, Ur Screen: NOT DETECTED
Benzodiazepine, Ur Scrn: NOT DETECTED
Cannabinoid 50 Ng, Ur ~~LOC~~: NOT DETECTED
Cocaine Metabolite,Ur ~~LOC~~: NOT DETECTED
MDMA (Ecstasy)Ur Screen: NOT DETECTED
Methadone Scn, Ur: NOT DETECTED
Opiate, Ur Screen: NOT DETECTED
Phencyclidine (PCP) Ur S: NOT DETECTED
Tricyclic, Ur Screen: NOT DETECTED

## 2018-05-16 NOTE — Care Management Obs Status (Signed)
[place] NOTIFICATION   Patient Details  Name: ANGAS ISABELL MRN: [id number] Date of Birth: 19-Dec-1928   Medicare Observation Status Notification Given:  Yes    Laneta Guerin A Shelbia Scinto, RN 05/16/2018, 2:38 PM

## 2018-05-16 NOTE — Progress Notes (Signed)
Consult placed to Social Work as patient expressing concerns regarding family trying to run his life. Patient states his children recently tried to have him deemed mentally incompetent and are upset that he is dating again after becoming a widow. Patient and his partner, Bonnita Nasuti talked extensively with this nurse about their concerns, support given to patient and his visitor.

## 2018-05-16 NOTE — Progress Notes (Signed)
[place] at [place] NAME: Timothy [name]    MR#:  [id number]  DATE OF BIRTH:  1929-01-29  SUBJECTIVE:  Came in after having diarrhea for two days. Had eaten [employer] after mountain trip recently. No abdominal pain. Tolerating clear liquid. Diarrhea slowing down. Family in the room.  REVIEW OF SYSTEMS:   Review of Systems  Constitutional: Negative for chills, fever and weight loss.  HENT: Negative for ear discharge, ear pain and nosebleeds.   Eyes: Negative for blurred vision, pain and discharge.  Respiratory: Negative for sputum production, shortness of breath, wheezing and stridor.   Cardiovascular: Negative for chest pain, palpitations, orthopnea and PND.  Gastrointestinal: Negative for abdominal pain, diarrhea, nausea and vomiting.  Genitourinary: Negative for frequency and urgency.  Musculoskeletal: Negative for back pain and joint pain.  Neurological: Positive for weakness. Negative for sensory change, speech change and focal weakness.  Psychiatric/Behavioral: Negative for depression and hallucinations. The patient is not nervous/anxious.    Tolerating Diet:yes Tolerating PT:   DRUG ALLERGIES:  No Known Allergies  VITALS:  Blood pressure 111/66, pulse 70, temperature 97.6 F (36.4 C), temperature source Oral, resp. rate 16, height 5\' 9"  (1.753 m), weight 74.5 kg, SpO2 100 %.  PHYSICAL EXAMINATION:   Physical Exam  GENERAL:  84 y.o.-year-old patient lying in the bed with no acute distress.  EYES: Pupils equal, round, reactive to light and accommodation. No scleral icterus. Extraocular muscles intact.  HEENT: Head atraumatic, normocephalic. Oropharynx and nasopharynx clear.  NECK:  Supple, no jugular venous distention. No thyroid enlargement, no tenderness.  LUNGS: Normal breath sounds bilaterally, no wheezing, rales, rhonchi. No use of accessory muscles of respiration.  CARDIOVASCULAR: S1, S2 normal. No murmurs, rubs, or  gallops.  ABDOMEN: Soft, nontender, nondistended. Bowel sounds present. No organomegaly or mass.  EXTREMITIES: No cyanosis, clubbing or edema b/l.    NEUROLOGIC: Cranial nerves II through XII are intact. No focal Motor or sensory deficits b/l.   PSYCHIATRIC:  patient is alert and oriented x 3.  SKIN: No obvious rash, lesion, or ulcer.   LABORATORY PANEL:  CBC Recent Labs  Lab 05/16/18 0628  WBC 7.3  HGB 12.1*  HCT 39.4  PLT 151    Chemistries  Recent Labs  Lab 05/15/18 1427 05/16/18 0628  NA 137 136  K 3.4* 3.8  CL 103 105  CO2 25 22  GLUCOSE 157* 103*  BUN 12 16  CREATININE 1.35* 1.18  CALCIUM 8.7* 8.4*  AST 31  --   ALT 7  --   ALKPHOS 51  --   BILITOT 0.8  --    Cardiac Enzymes Recent Labs  Lab 05/15/18 1445  TROPONINI <0.03   RADIOLOGY:  No results found. ASSESSMENT AND PLAN:  Timothy [name]  is a 84 y.o. male with a known history of chronic diastolic heart failure, Paget's disease, osteoporosis, history TIA, paroxysmal atrial fibrillation, type 2 diabetes, Parkinson's disease, and moderate aortic stenosis who presented to the ED with multiple episodes of diarrhea over the last 2 days.  He states he has had about 6 episodes of diarrhea total.  He denies any hematochezia or melena.  He just returned from a trip to the mountains.    *Campylobacter gastroenteritis- stool pathogen panel positive for Campylobacter. -Azithromycin 500 mg daily for 3 days or until symptoms have improved -Gentle IV fluids -tolerating clear liquid diet and advanced to soft diet as tolerated -diarrhea slowing down  *Paroxysmal atrial fibrillation- in  normal sinus rhythm. -Continue home digoxin, flecainide, metoprolol, eliquis  *Chronic diastolic congestive heart failure- stable, no s*igns of volume overload -Takes torsemide prn at home, will hold for now  *Type 2 diabetes- takes oral medications at home -Sensitive SSI  *CKD 3- creatinine at baseline -Avoid nephrotoxic  agents  *Hyperlipidemia- stable -Continue home Crestor  *Anxiety-stable -Continue home Zoloft  Parkinson's disease- stable, follows with neurologist at [facility] home Sinemet  If continues to improve will discharge to home tomorrow discussed with patient and family. Case discussed with Care Management/Social Worker. Management plans discussed with the patient, family and they are in agreement.  CODE STATUS: partial  DVT Prophylaxis: lovenox  TOTAL TIME TAKING CARE OF THIS PATIENT: *28 minutes.  >50% time spent on counselling and coordination of care  POSSIBLE D/C IN *1-2* DAYS, DEPENDING ON CLINICAL CONDITION.  Note: This dictation was prepared with Dragon dictation along with smaller phrase technology. Any transcriptional errors that result from this process are unintentional.  Fritzi Mandes M.D on 05/16/2018 at 1:18 PM  Between 7am to 6pm - Pager - [phone]  After 6pm go to [email or web address] - Proofreader  [facility]  Office  [phone]  CC: Primary care physician; Guadalupe Maple, MDPatient ID: Timothy [name], male   DOB: [birth year], 84 y.o.   MRN: [id number]

## 2018-05-17 DIAGNOSIS — M889 Osteitis deformans of unspecified bone: Secondary | ICD-10-CM | POA: Diagnosis present

## 2018-05-17 DIAGNOSIS — Z7901 Long term (current) use of anticoagulants: Secondary | ICD-10-CM | POA: Diagnosis not present

## 2018-05-17 DIAGNOSIS — M810 Age-related osteoporosis without current pathological fracture: Secondary | ICD-10-CM | POA: Diagnosis present

## 2018-05-17 DIAGNOSIS — Z823 Family history of stroke: Secondary | ICD-10-CM | POA: Diagnosis not present

## 2018-05-17 DIAGNOSIS — Z8673 Personal history of transient ischemic attack (TIA), and cerebral infarction without residual deficits: Secondary | ICD-10-CM | POA: Diagnosis not present

## 2018-05-17 DIAGNOSIS — A045 Campylobacter enteritis: Secondary | ICD-10-CM | POA: Diagnosis present

## 2018-05-17 DIAGNOSIS — Z79899 Other long term (current) drug therapy: Secondary | ICD-10-CM | POA: Diagnosis not present

## 2018-05-17 DIAGNOSIS — Z85828 Personal history of other malignant neoplasm of skin: Secondary | ICD-10-CM | POA: Diagnosis not present

## 2018-05-17 DIAGNOSIS — K529 Noninfective gastroenteritis and colitis, unspecified: Secondary | ICD-10-CM | POA: Diagnosis present

## 2018-05-17 DIAGNOSIS — F419 Anxiety disorder, unspecified: Secondary | ICD-10-CM | POA: Diagnosis present

## 2018-05-17 DIAGNOSIS — I350 Nonrheumatic aortic (valve) stenosis: Secondary | ICD-10-CM | POA: Diagnosis present

## 2018-05-17 DIAGNOSIS — E785 Hyperlipidemia, unspecified: Secondary | ICD-10-CM | POA: Diagnosis present

## 2018-05-17 DIAGNOSIS — E1122 Type 2 diabetes mellitus with diabetic chronic kidney disease: Secondary | ICD-10-CM | POA: Diagnosis present

## 2018-05-17 DIAGNOSIS — R197 Diarrhea, unspecified: Secondary | ICD-10-CM | POA: Diagnosis not present

## 2018-05-17 DIAGNOSIS — Z7984 Long term (current) use of oral hypoglycemic drugs: Secondary | ICD-10-CM | POA: Diagnosis not present

## 2018-05-17 DIAGNOSIS — E876 Hypokalemia: Secondary | ICD-10-CM | POA: Diagnosis present

## 2018-05-17 DIAGNOSIS — N183 Chronic kidney disease, stage 3 (moderate): Secondary | ICD-10-CM | POA: Diagnosis present

## 2018-05-17 DIAGNOSIS — I5032 Chronic diastolic (congestive) heart failure: Secondary | ICD-10-CM | POA: Diagnosis present

## 2018-05-17 DIAGNOSIS — I480 Paroxysmal atrial fibrillation: Secondary | ICD-10-CM | POA: Diagnosis present

## 2018-05-17 DIAGNOSIS — E860 Dehydration: Secondary | ICD-10-CM | POA: Diagnosis present

## 2018-05-17 DIAGNOSIS — G20 Parkinson's disease: Secondary | ICD-10-CM | POA: Diagnosis present

## 2018-05-17 DIAGNOSIS — K219 Gastro-esophageal reflux disease without esophagitis: Secondary | ICD-10-CM | POA: Diagnosis present

## 2018-05-17 LAB — GLUCOSE, CAPILLARY
Glucose-Capillary: 175 mg/dL — ABNORMAL HIGH (ref 70–99)
Glucose-Capillary: 219 mg/dL — ABNORMAL HIGH (ref 70–99)
Glucose-Capillary: 158 mg/dL — ABNORMAL HIGH (ref 70–99)
Glucose-Capillary: 163 mg/dL — ABNORMAL HIGH (ref 70–99)

## 2018-05-17 MED ORDER — METFORMIN HCL 500 MG PO TABS
500.0000 mg | ORAL_TABLET | Freq: Two times a day (BID) | ORAL | Status: DC
  Administered 2018-05-17 – 2018-05-18 (×3): 500 mg via ORAL
  Filled 2018-05-17 (×3): qty 1

## 2018-05-17 MED ORDER — GLIPIZIDE 5 MG PO TABS
5.0000 mg | ORAL_TABLET | Freq: Two times a day (BID) | ORAL | Status: DC
  Administered 2018-05-17 – 2018-05-18 (×3): 5 mg via ORAL
  Filled 2018-05-17 (×4): qty 1

## 2018-05-17 MED ORDER — GLIPIZIDE-METFORMIN HCL 5-500 MG PO TABS: 1.0000 | ORAL_TABLET | Freq: Two times a day (BID) | ORAL | Status: DC

## 2018-05-17 NOTE — Progress Notes (Signed)
[place] at [place] NAME: Timothy [name]    MR#:  [id number]  DATE OF BIRTH:  1928/11/23  SUBJECTIVE:  patient out in the chair eating breakfast. He had several bowel movements this morning. Appears weak.  REVIEW OF SYSTEMS:   Review of Systems  Constitutional: Negative for chills, fever and weight loss.  HENT: Negative for ear discharge, ear pain and nosebleeds.   Eyes: Negative for blurred vision, pain and discharge.  Respiratory: Negative for sputum production, shortness of breath, wheezing and stridor.   Cardiovascular: Negative for chest pain, palpitations, orthopnea and PND.  Gastrointestinal: Negative for abdominal pain, diarrhea, nausea and vomiting.  Genitourinary: Negative for frequency and urgency.  Musculoskeletal: Negative for back pain and joint pain.  Neurological: Positive for weakness. Negative for sensory change, speech change and focal weakness.  Psychiatric/Behavioral: Negative for depression and hallucinations. The patient is not nervous/anxious.    Tolerating Diet:yes Tolerating PT: pending  DRUG ALLERGIES:  No Known Allergies  VITALS:  Blood pressure 135/64, pulse 68, temperature (!) 97.3 F (36.3 C), temperature source Oral, resp. rate 16, height 5\' 9"  (1.753 m), weight 74.5 kg, SpO2 99 %.  PHYSICAL EXAMINATION:   Physical Exam  GENERAL:  84 y.o.-year-old patient lying in the bed with no acute distress.  EYES: Pupils equal, round, reactive to light and accommodation. No scleral icterus. Extraocular muscles intact.  HEENT: Head atraumatic, normocephalic. Oropharynx and nasopharynx clear.  NECK:  Supple, no jugular venous distention. No thyroid enlargement, no tenderness.  LUNGS: Normal breath sounds bilaterally, no wheezing, rales, rhonchi. No use of accessory muscles of respiration.  CARDIOVASCULAR: S1, S2 normal. No murmurs, rubs, or gallops.  ABDOMEN: Soft, nontender, nondistended. Bowel sounds  present. No organomegaly or mass.  EXTREMITIES: No cyanosis, clubbing or edema b/l.    NEUROLOGIC: Cranial nerves II through XII are intact. No focal Motor or sensory deficits b/l.   PSYCHIATRIC:  patient is alert and oriented x 3.  SKIN: No obvious rash, lesion, or ulcer.   LABORATORY PANEL:  CBC Recent Labs  Lab 05/16/18 0628  WBC 7.3  HGB 12.1*  HCT 39.4  PLT 151    Chemistries  Recent Labs  Lab 05/15/18 1427 05/16/18 0628  NA 137 136  K 3.4* 3.8  CL 103 105  CO2 25 22  GLUCOSE 157* 103*  BUN 12 16  CREATININE 1.35* 1.18  CALCIUM 8.7* 8.4*  AST 31  --   ALT 7  --   ALKPHOS 51  --   BILITOT 0.8  --    Cardiac Enzymes Recent Labs  Lab 05/15/18 1445  TROPONINI <0.03   RADIOLOGY:  No results found. ASSESSMENT AND PLAN:  Timothy [name]  is a 84 y.o. male with a known history of chronic diastolic heart failure, Paget's disease, osteoporosis, history TIA, paroxysmal atrial fibrillation, type 2 diabetes, Parkinson's disease, and moderate aortic stenosis who presented to the ED with multiple episodes of diarrhea over the last 2 days.  He states he has had about 6 episodes of diarrhea total.  He denies any hematochezia or melena.  He just returned from a trip to the mountains.    *Campylobacter gastroenteritis- stool pathogen panel positive for Campylobacter. -Azithromycin 500 mg daily for 3 days or until symptoms have improved -tolerating clear liquid diet and advanced to soft diet as tolerated -diarrhea still this am. Appears weak  *Paroxysmal atrial fibrillation- in normal sinus rhythm. -Continue home digoxin, flecainide, metoprolol, eliquis  *Chronic  diastolic congestive heart failure- stable, no s*igns of volume overload -Takes torsemide prn at home, will hold for now  *Type 2 diabetes- takes oral medications at home -Sensitive SSI  *CKD 3- creatinine at baseline -Avoid nephrotoxic agents  *Hyperlipidemia- stable -Continue home  Crestor  *Anxiety-stable -Continue home Zoloft  Parkinson's disease- stable, follows with neurologist at [facility] home Sinemet  PT to see pt  Case discussed with Care Management/Social Worker. Management plans discussed with the patient, family and they are in agreement.  CODE STATUS: partial  DVT Prophylaxis: lovenox  TOTAL TIME TAKING CARE OF THIS PATIENT: *28 minutes.  >50% time spent on counselling and coordination of care  POSSIBLE D/C IN *1-2* DAYS, DEPENDING ON CLINICAL CONDITION.  Note: This dictation was prepared with Dragon dictation along with smaller phrase technology. Any transcriptional errors that result from this process are unintentional.  Fritzi Mandes M.D on 05/17/2018 at 11:45 AM  Between 7am to 6pm - Pager - [phone]  After 6pm go to [email or web address] - Proofreader  [facility]  Office  [phone]  CC: Primary care physician; Guadalupe Maple, MDPatient ID: Timothy [name], male   DOB: 03/17/1929, 84 y.o.   MRN: [id number]

## 2018-05-17 NOTE — Progress Notes (Signed)
Patient's daughter [name] called asking for an update on whether patient was going to be discharged. Glea also asked about toxicology results. Glea also asked about what PT"s recommendations were. Information was given as appropriate. Timothy [name]

## 2018-05-17 NOTE — Progress Notes (Signed)
Per MD okay for RN to order PT.

## 2018-05-17 NOTE — Evaluation (Signed)
Physical Therapy Evaluation Patient Details Name: Timothy [name] MRN: [id number] DOB: September 08, 1928 Today's Date: 05/17/2018   History of Present Illness  Patient is a 84 year old male admitted for campylobacter gastoenteritis.  PMH includes PD, TIA, skin CA, CHF, back pain and DM II.  Clinical Impression  Pt is a 84 year old male who lives in a one story home with his significant other.  He is dependent on RW and a caregiver for ADL's at baseline.  Pt demonstrates decreased movement velocity consistent with PD diagnosis but is able to perform all mobility with min A or CGA.  He ambulated 50 ft with RW, demonstrating very slow gait and reporting fatigue.  Pt able to manage RW safely but requires reminders to avoid pushing RW anteriorly.  Pt demonstrated good strength with the exception of hip flexors.  He also presents with poor posture indicative of fall risk.  Pt will continue to benefit from skilled PT with focus on strength, safe functional mobility and tolerance to activity.  He will benefit from [facility] PT following discharge.    Follow Up Recommendations Home health PT;Supervision for mobility/OOB    Equipment Recommendations  None recommended by PT    Recommendations for Other Services       Precautions / Restrictions Precautions Precautions: Fall Restrictions Weight Bearing Restrictions: No      Mobility  Bed Mobility Overal bed mobility: (In chair upon PT arrival.)                Transfers Overall transfer level: Needs assistance Equipment used: Rolling walker (2 wheeled) Transfers: Sit to/from Stand Sit to Stand: Min assist         General transfer comment: Pt able to rise very slowly with min A for some stabilization.  Good use of RW.  Ambulation/Gait Ambulation/Gait assistance: Min guard Gait Distance (Feet): 50 Feet Assistive device: Rolling walker (2 wheeled)     Gait velocity interpretation: <1.8 ft/sec, indicate of risk for recurrent falls General Gait  Details: Low foot clearance and step length, pushes RW anteriorly, slow with sequencing during turns.  Does present with short shuffling steps which improve with movement.  Stairs            Wheelchair Mobility    Modified Rankin (Stroke Patients Only)       Balance Overall balance assessment: Modified Independent                                           Pertinent Vitals/Pain Pain Assessment: No/denies pain    Home Living Family/patient expects to be discharged to:: Private residence Living Arrangements: Spouse/significant other(Pt's girlfriend helps pt get in and out of car and ) Available Help at Discharge: Family;Available 24 hours/day;Personal care attendant(Aide 3-4 hrs/day in the morning.) Type of Home: House Home Access: Stairs to enter Entrance Stairs-Rails: Can reach both Entrance Stairs-Number of Steps: 5 Home Layout: One level Home Equipment: Walker - 2 wheels      Prior Function Level of Independence: Needs assistance   Gait / Transfers Assistance Needed: Keeps walkers at the top of the steps and the bottom  ADL's / Homemaking Assistance Needed: Assistance with bathing and dressing        Hand Dominance        Extremity/Trunk Assessment   Upper Extremity Assessment Upper Extremity Assessment: Overall WFL for tasks assessed  Lower Extremity Assessment Lower Extremity Assessment: Overall WFL for tasks assessed(Grossly 4+/5 with exception of hip flexors: 3+/5 bilaterally.)    Cervical / Trunk Assessment Cervical / Trunk Assessment: Kyphotic  Communication   Communication: HOH  Cognition Arousal/Alertness: Awake/alert Behavior During Therapy: WFL for tasks assessed/performed Overall Cognitive Status: No family/caregiver present to determine baseline cognitive functioning                                 General Comments: Expressive aphasia/difficulty word finding.  Able to follow commands consistently.       General Comments      Exercises     Assessment/Plan    PT Assessment Patient needs continued PT services  PT Problem List Decreased strength;Decreased mobility;Decreased balance;Decreased activity tolerance       PT Treatment Interventions DME instruction;Therapeutic activities;Gait training;Therapeutic exercise;Stair training;Balance training;Neuromuscular re-education;Functional mobility training;Patient/family education    PT Goals (Current goals can be found in the Care Plan section)  Acute Rehab PT Goals Patient Stated Goal: To be able to walk without RW as much as possible. PT Goal Formulation: With patient Time For Goal Achievement: 05/31/18 Potential to Achieve Goals: Good    Frequency Min 2X/week   Barriers to discharge        Co-evaluation               AM-PAC PT "6 Clicks" Daily Activity  Outcome Measure Difficulty turning over in bed (including adjusting bedclothes, sheets and blankets)?: A Little Difficulty moving from lying on back to sitting on the side of the bed? : A Lot Difficulty sitting down on and standing up from a chair with arms (e.g., wheelchair, bedside commode, etc,.)?: A Lot Help needed moving to and from a bed to chair (including a wheelchair)?: A Little Help needed walking in hospital room?: A Little Help needed climbing 3-5 steps with a railing? : A Little 6 Click Score: 16    End of Session Equipment Utilized During Treatment: Gait belt Activity Tolerance: Patient tolerated treatment well Patient left: in chair;with chair alarm set;with call bell/phone within reach Nurse Communication: Mobility status PT Visit Diagnosis: Unsteadiness on feet (R26.81);Muscle weakness (generalized) (M62.81)    Time: 1435-1510 PT Time Calculation (min) (ACUTE ONLY): 35 min   Charges:   PT Evaluation $PT Eval Low Complexity: 1 Low          Roxanne Gates, PT, DPT   Roxanne Gates 05/17/2018, 5:02 PM

## 2018-05-18 DIAGNOSIS — E876 Hypokalemia: Secondary | ICD-10-CM | POA: Diagnosis not present

## 2018-05-18 DIAGNOSIS — I5032 Chronic diastolic (congestive) heart failure: Secondary | ICD-10-CM | POA: Diagnosis not present

## 2018-05-18 DIAGNOSIS — A045 Campylobacter enteritis: Secondary | ICD-10-CM | POA: Diagnosis not present

## 2018-05-18 DIAGNOSIS — I480 Paroxysmal atrial fibrillation: Secondary | ICD-10-CM | POA: Diagnosis not present

## 2018-05-18 LAB — GLUCOSE, CAPILLARY
Glucose-Capillary: 133 mg/dL — ABNORMAL HIGH (ref 70–99)
Glucose-Capillary: 228 mg/dL — ABNORMAL HIGH (ref 70–99)

## 2018-05-18 MED ORDER — TUBERCULIN PPD 5 UNIT/0.1ML ID SOLN
5.0000 [IU] | Freq: Once | INTRADERMAL | Status: DC
  Administered 2018-05-18: 5 [IU] via INTRADERMAL
  Filled 2018-05-18: qty 0.1

## 2018-05-18 NOTE — Progress Notes (Signed)
Sheilah Mins Stephani  A and O x 4. VSS. Pt tolerating diet well. No complaints of pain or nausea. IV removed intact, prescriptions given. Pt voiced understanding of discharge instructions with no further questions. Pt discharged via wheelchair with NT.  Allergies as of 05/18/2018   No Known Allergies     Medication List    STOP taking these medications   DIGOX 0.125 MG tablet Generic drug:  digoxin     TAKE these medications   acetaminophen 500 MG tablet Commonly known as:  TYLENOL Take 1,000 mg by mouth every 8 (eight) hours as needed for mild pain or moderate pain.   CALCIUM + D PO Take 1 tablet by mouth 2 (two) times daily.   Cannabidiol 100 MG/ML Soln Take by mouth as directed.   CEROVITE SENIOR PO Take 1 tablet by mouth daily.   cyanocobalamin 1000 MCG/ML injection Commonly known as:  (VITAMIN B-12) INJECT 1 ML INTO THE MUSCLE EVERY 30 DAYS What changed:  See the new instructions.   ELIQUIS 5 MG Tabs tablet Generic drug:  apixaban TAKE 1 TABLET BY MOUTH TWICE DAILY What changed:  how much to take   FARXIGA 10 MG Tabs tablet Generic drug:  dapagliflozin propanediol TAKE ONE TABLET BY MOUTH EVERY DAY What changed:  how much to take   flecainide 50 MG tablet Commonly known as:  TAMBOCOR Take 50 mg by mouth 2 (two) times daily.   glipiZIDE-metformin 5-500 MG tablet Commonly known as:  METAGLIP Take 1 tablet by mouth 2 (two) times daily.   metoprolol succinate 50 MG 24 hr tablet Commonly known as:  TOPROL-XL TAKE ONE TABLET BY MOUTH EVERY DAY   pantoprazole 40 MG tablet Commonly known as:  PROTONIX Take 1 tablet (40 mg total) by mouth 2 (two) times daily.   PHILLIPS COLON HEALTH PO Take 1 capsule by mouth daily.   rosuvastatin 10 MG tablet Commonly known as:  CRESTOR Take 1 tablet (10 mg total) by mouth daily.   sertraline 50 MG tablet Commonly known as:  ZOLOFT Take 50 mg by mouth daily.   SINEMET 25-100 MG tablet Generic drug:  carbidopa-levodopa Take  1 tablet by mouth 4 (four) times daily.   sitaGLIPtin 100 MG tablet Commonly known as:  JANUVIA Take 1 tablet (100 mg total) by mouth daily. What changed:  Another medication with the same name was changed. Make sure you understand how and when to take each.   JANUVIA 100 MG tablet Generic drug:  sitaGLIPtin TAKE ONE TABLET BY MOUTH EVERY DAY What changed:  how much to take   torsemide 20 MG tablet Commonly known as:  DEMADEX Take 1 tablet (20 mg total) by mouth daily as needed.       Vitals:   05/18/18 0812 05/18/18 0856  BP: 131/67 (!) 160/74  Pulse: 65 66  Resp:  17  Temp:    SpO2:  98%    Francesco Sor

## 2018-05-18 NOTE — Discharge Instructions (Signed)
TB test was placed in left inner forearm on 11/12 - will need read 05/20/18 at 13:00.

## 2018-05-18 NOTE — Progress Notes (Signed)
Upon rounding on patient, patient awake in bed. Patient pleasant and cooperative. Patient expressed concerns that family is trying to have him committed to a nursing home. Patient stated " they are emotional unstable". Patient verbalized that his family is upset because he has started dating since his wife past away.  Patient verbalized that he would not get in the car with daughter upon discharge, patient stated " I am scared to" . I asked patient why he was scared to get in the car with his daughter and he stated " I don't know what she will try to do". Patient given support as needed. [name]

## 2018-05-18 NOTE — NC FL2 (Addendum)
[place] LEVEL OF CARE SCREENING TOOL     IDENTIFICATION  Patient Name: Timothy [name] Birthdate: 1928/12/19 Sex: male Admission Date (Current Location): 05/15/2018  [place] and [place] Number:  Engineering geologist and Address:  [facility], [address], [place], [place] [address]      Provider Number: [id number]  Attending Physician Name and Address:  Fritzi Mandes, MD  Relative Name and Phone Number:       Current Level of Care: Hospital Recommended Level of Care: Other (Comment)(independent living) Prior Approval Number:    Date Approved/Denied:   PASRR Number:    Discharge Plan: (independent living)    Current Diagnoses: Patient Active Problem List   Diagnosis Date Noted  . Gastroenteritis 05/17/2018  . Campylobacter gastroenteritis 05/15/2018  . Aortic valve stenosis 02/18/2018  . Near syncope 01/31/2018  . Dysphagia 12/10/2017  . Advanced care planning/counseling discussion 11/27/2016  . Pernicious anemia 05/28/2015  . Leg weakness, bilateral 04/12/2015  . Tremor 03/27/2015  . Chronic anxiety 02/20/2015  . SVT (supraventricular tachycardia) ([place]) 02/16/2015  . Paroxysmal atrial fibrillation ([place]) 02/16/2015  . History of back surgery 02/16/2015  . AAA (abdominal aortic aneurysm) without rupture ([place]) 10/07/2012  . TIA (transient ischemic attack) 12/29/2011  . HLD (hyperlipidemia) 09/24/2011  . Essential hypertension 09/24/2011  . DM type 2 (diabetes mellitus, type 2) ([place]) 09/24/2011    Orientation RESPIRATION BLADDER Height & Weight     Self, Time, Situation, Place  Normal Continent Weight: 164 lb 3.9 oz (74.5 kg) Height:  5\' 9"  (175.3 cm)  BEHAVIORAL SYMPTOMS/MOOD NEUROLOGICAL BOWEL NUTRITION STATUS  (none) (none) Continent Diet(cardiac)  AMBULATORY STATUS COMMUNICATION OF NEEDS Skin   Independent Verbally Normal                       Personal Care Assistance Level of Assistance  Bathing, Feeding,  Dressing Bathing Assistance: Independent Feeding assistance: Independent Dressing Assistance: Independent     Functional Limitations Info  (none noted)          SPECIAL CARE FACTORS FREQUENCY  PT (By licensed PT)   PPD Placed 05/18/18                     Contractures Contractures Info: Not present    Additional Factors Info  Code Status                  Medication List    STOP taking these medications   DIGOX 0.125 MG tablet Generic drug:  digoxin     TAKE these medications   acetaminophen 500 MG tablet Commonly known as:  TYLENOL Take 1,000 mg by mouth every 8 (eight) hours as needed for mild pain or moderate pain.   CALCIUM + D PO Take 1 tablet by mouth 2 (two) times daily.   Cannabidiol 100 MG/ML Soln Take by mouth as directed.   CEROVITE SENIOR PO Take 1 tablet by mouth daily.   cyanocobalamin 1000 MCG/ML injection Commonly known as:  (VITAMIN B-12) INJECT 1 ML INTO THE MUSCLE EVERY 30 DAYS What changed:  See the new instructions.   ELIQUIS 5 MG Tabs tablet Generic drug:  apixaban TAKE 1 TABLET BY MOUTH TWICE DAILY What changed:  how much to take   FARXIGA 10 MG Tabs tablet Generic drug:  dapagliflozin propanediol TAKE ONE TABLET BY MOUTH EVERY DAY What changed:  how much to take   flecainide 50 MG tablet Commonly known as:  TAMBOCOR Take 50 mg by mouth 2 (two) times daily.   glipiZIDE-metformin 5-500 MG tablet Commonly known as:  METAGLIP Take 1 tablet by mouth 2 (two) times daily.   metoprolol succinate 50 MG 24 hr tablet Commonly known as:  TOPROL-XL TAKE ONE TABLET BY MOUTH EVERY DAY   pantoprazole 40 MG tablet Commonly known as:  PROTONIX Take 1 tablet (40 mg total) by mouth 2 (two) times daily.   PHILLIPS COLON HEALTH PO Take 1 capsule by mouth daily.   rosuvastatin 10 MG tablet Commonly known as:  CRESTOR Take 1 tablet (10 mg total) by mouth daily.   sertraline 50 MG tablet Commonly known as:   ZOLOFT Take 50 mg by mouth daily.   SINEMET 25-100 MG tablet Generic drug:  carbidopa-levodopa Take 1 tablet by mouth 4 (four) times daily.   sitaGLIPtin 100 MG tablet Commonly known as:  JANUVIA Take 1 tablet (100 mg total) by mouth daily. What changed:  Another medication with the same name was changed. Make sure you understand how and when to take each.   JANUVIA 100 MG tablet Generic drug:  sitaGLIPtin TAKE ONE TABLET BY MOUTH EVERY DAY What changed:  how much to take   torsemide 20 MG tablet Commonly known as:  DEMADEX Take 1 tablet (20 mg total) by mouth daily as needed.        Additional Information home health   [place], [place]

## 2018-05-18 NOTE — Care Management Note (Signed)
Case Management Note  Patient Details  Name: Timothy [name] MRN: [id number] Date of Birth: 08-20-1928   Patient discharged to [facility] today.  Corene Cornea with [place] given referral and notified of patient's disposition   Subjective/Objective:                    Action/Plan:   Expected Discharge Date:  05/18/18               Expected Discharge Plan:  [place]  In-House Referral:     Discharge planning Services  CM Consult  Post Acute Care Choice:  Home Health Choice offered to:  Patient  DME Arranged:    DME Agency:     HH Arranged:  RN, PT [place] Agency:  [facility]  Status of Service:  Completed, signed off  If discussed at [place] of Stay Meetings, dates discussed:    Additional Comments:  Beverly Sessions, RN 05/18/2018, 5:38 PM

## 2018-05-18 NOTE — Discharge Summary (Signed)
[place] [facility] [place] NAME: Timothy [name]    MR#:  [id number]  DATE OF BIRTH:  1929-01-04  DATE OF ADMISSION:  05/15/2018 ADMITTING PHYSICIAN: Sela Hua, MD  DATE OF DISCHARGE: 05/18/2018  PRIMARY CARE PHYSICIAN: Guadalupe Maple, MD    ADMISSION DIAGNOSIS:  Dehydration [E86.0] Diarrhea, unspecified type [R19.7]  DISCHARGE DIAGNOSIS:  Campylobacter diarrhea  SECONDARY DIAGNOSIS:   Past Medical History:  Diagnosis Date  . Back pain   . CHF (congestive heart failure) ([place])   . Esophageal reflux   . 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 Senile osteoporosis   . Skin cancer   . TIA (transient ischemic attack)     HOSPITAL COURSE:  Timothy [name] 84 y.o.malewith a known history of chronic diastolic heart failure, Paget's disease, osteoporosis, history TIA,paroxysmal atrial fibrillation, type 2 diabetes,Parkinson's disease, and moderate aortic stenosis who presented to the ED with multiple episodes of diarrhea over the last 2 days. He states he has had about 6 episodes of diarrhea total. He denies any hematochezia or melena. He just [place].   *Campylobacter gastroenteritis-stool pathogen panel positive for Campylobacter. -Azithromycin 500 mg daily for 3 days or until symptoms have improved -tolerating clear liquid diet and advanced to soft diet as tolerated -diarrhea decreasing in freq  -tolerating PO diet  *Paroxysmal atrial fibrillation-in normal sinus rhythm. -Continue home  flecainide, metoprolol, eliquis  *Chronic diastolic congestive heart failure-stable, no s*igns of volume overload -Takes torsemide prn at home  *Type 2 diabetes-takes oral medications at home -Sensitive SSI  *CKD 3-creatinine at baseline -Avoid nephrotoxic agents  *Hyperlipidemia-stable -Continue home Crestor  *Anxiety-stable -Continue home  Zoloft  Parkinson's disease-stable, follows with neurologist at [place] home Sinemet  physical therapy recommends home health PT will arrange that. Had a long discussion with Education officer, museum, Transport planner, patient, two daughters regarding discharge plan. Final decision was patient go to assisted living with physical therapy. Ppd ordered CONSULTS OBTAINED:    DRUG ALLERGIES:  No Known Allergies  DISCHARGE MEDICATIONS:   Allergies as of 05/18/2018   No Known Allergies     Medication List    STOP taking these medications   DIGOX 0.125 MG tablet Generic drug:  digoxin     TAKE these medications   acetaminophen 500 MG tablet Commonly known as:  TYLENOL Take 1,000 mg by mouth every 8 (eight) hours as needed for mild pain or moderate pain.   CALCIUM + D PO Take 1 tablet by mouth 2 (two) times daily.   Cannabidiol 100 MG/ML Soln Take by mouth as directed.   CEROVITE SENIOR PO Take 1 tablet by mouth daily.   cyanocobalamin 1000 MCG/ML injection Commonly known as:  (VITAMIN B-12) INJECT 1 ML INTO THE MUSCLE EVERY 30 DAYS What changed:  See the new instructions.   ELIQUIS 5 MG Tabs tablet Generic drug:  apixaban TAKE 1 TABLET BY MOUTH TWICE DAILY What changed:  how much to take   FARXIGA 10 MG Tabs tablet Generic drug:  dapagliflozin propanediol TAKE ONE TABLET BY MOUTH EVERY DAY What changed:  how much to take   flecainide 50 MG tablet Commonly known as:  TAMBOCOR Take 50 mg by mouth 2 (two) times daily.   glipiZIDE-metformin 5-500 MG tablet Commonly known as:  METAGLIP Take 1 tablet by mouth 2 (two) times daily.   metoprolol succinate 50 MG 24 hr tablet Commonly known  as:  TOPROL-XL TAKE ONE TABLET BY MOUTH EVERY DAY   pantoprazole 40 MG tablet Commonly known as:  PROTONIX Take 1 tablet (40 mg total) by mouth 2 (two) times daily.   PHILLIPS COLON HEALTH PO Take 1 capsule by mouth daily.   rosuvastatin 10 MG tablet Commonly known as:   CRESTOR Take 1 tablet (10 mg total) by mouth daily.   sertraline 50 MG tablet Commonly known as:  ZOLOFT Take 50 mg by mouth daily.   SINEMET 25-100 MG tablet Generic drug:  carbidopa-levodopa Take 1 tablet by mouth 4 (four) times daily.   sitaGLIPtin 100 MG tablet Commonly known as:  JANUVIA Take 1 tablet (100 mg total) by mouth daily. What changed:  Another medication with the same name was changed. Make sure you understand how and when to take each.   JANUVIA 100 MG tablet Generic drug:  sitaGLIPtin TAKE ONE TABLET BY MOUTH EVERY DAY What changed:  how much to take   torsemide 20 MG tablet Commonly known as:  DEMADEX Take 1 tablet (20 mg total) by mouth daily as needed.       If you experience worsening of your admission symptoms, develop shortness of breath, life threatening emergency, suicidal or homicidal thoughts you must seek medical attention immediately by calling 911 or calling your MD immediately  if symptoms less severe.  You Must read complete instructions/literature along with all the possible adverse reactions/side effects for all the Medicines you take and that have been prescribed to you. Take any new Medicines after you have completely understood and accept all the possible adverse reactions/side effects.   Please note  You were cared for by a hospitalist during your hospital stay. If you have any questions about your discharge medications or the care you received while you were in the hospital after you are discharged, you can call the unit and asked to speak with the hospitalist on call if the hospitalist that took care of you is not available. Once you are discharged, your primary care physician will handle any further medical issues. Please note that NO REFILLS for any discharge medications will be authorized once you are discharged, as it is imperative that you return to your primary care physician (or establish a relationship with a primary care physician if  you do not have one) for your aftercare needs so that they can reassess your need for medications and monitor your lab values. Today   SUBJECTIVE   Doing well  VITAL SIGNS:  Blood pressure (!) 160/74, pulse 66, temperature 97.8 F (36.6 C), temperature source Oral, resp. rate 17, height 5\' 9"  (1.753 m), weight 74.5 kg, SpO2 98 %.  I/O:    Intake/Output Summary (Last 24 hours) at 05/18/2018 1212 Last data filed at 05/18/2018 0926 Gross per 24 hour  Intake 2381.38 ml  Output 1050 ml  Net 1331.38 ml    PHYSICAL EXAMINATION:  GENERAL:  84 y.o.-year-old patient lying in the bed with no acute distress.  EYES: Pupils equal, round, reactive to light and accommodation. No scleral icterus. Extraocular muscles intact.  HEENT: Head atraumatic, normocephalic. Oropharynx and nasopharynx clear.  NECK:  Supple, no jugular venous distention. No thyroid enlargement, no tenderness.  LUNGS: Normal breath sounds bilaterally, no wheezing, rales,rhonchi or crepitation. No use of accessory muscles of respiration.  CARDIOVASCULAR: S1, S2 normal. No murmurs, rubs, or gallops.  ABDOMEN: Soft, non-tender, non-distended. Bowel sounds present. No organomegaly or mass.  EXTREMITIES: No pedal edema, cyanosis, or clubbing.  NEUROLOGIC:  Cranial nerves II through XII are intact. Muscle strength 5/5 in all extremities. Sensation intact. Gait not checked.  PSYCHIATRIC: The patient is alert and oriented x 3.  SKIN: No obvious rash, lesion, or ulcer.   DATA REVIEW:   CBC  Recent Labs  Lab 05/16/18 0628  WBC 7.3  HGB 12.1*  HCT 39.4  PLT 151    Chemistries  Recent Labs  Lab 05/15/18 1427 05/16/18 0628  NA 137 136  K 3.4* 3.8  CL 103 105  CO2 25 22  GLUCOSE 157* 103*  BUN 12 16  CREATININE 1.35* 1.18  CALCIUM 8.7* 8.4*  AST 31  --   ALT 7  --   ALKPHOS 51  --   BILITOT 0.8  --     Microbiology Results   Recent Results (from the past 240 hour(s))  Gastrointestinal Panel by PCR , Stool      Status: Abnormal   Collection Time: 05/15/18  2:31 PM  Result Value Ref Range Status   Campylobacter species DETECTED (A) NOT DETECTED Final    Comment: RESULT CALLED TO, READ BACK BY AND VERIFIED WITH: HUNTER ORE 05/15/18 1710 REC    Plesimonas shigelloides NOT DETECTED NOT DETECTED Final   Salmonella species NOT DETECTED NOT DETECTED Final   Yersinia enterocolitica NOT DETECTED NOT DETECTED Final   Vibrio species NOT DETECTED NOT DETECTED Final   Vibrio cholerae NOT DETECTED NOT DETECTED Final   Enteroaggregative E coli (EAEC) NOT DETECTED NOT DETECTED Final   Enteropathogenic E coli (EPEC) NOT DETECTED NOT DETECTED Final   Enterotoxigenic E coli (ETEC) NOT DETECTED NOT DETECTED Final   Shiga like toxin producing E coli (STEC) NOT DETECTED NOT DETECTED Final   Shigella/Enteroinvasive E coli (EIEC) NOT DETECTED NOT DETECTED Final   Cryptosporidium NOT DETECTED NOT DETECTED Final   Cyclospora cayetanensis NOT DETECTED NOT DETECTED Final   Entamoeba histolytica NOT DETECTED NOT DETECTED Final   Giardia lamblia NOT DETECTED NOT DETECTED Final   Adenovirus F40/41 NOT DETECTED NOT DETECTED Final   Astrovirus NOT DETECTED NOT DETECTED Final   Norovirus GI/GII NOT DETECTED NOT DETECTED Final   Rotavirus A NOT DETECTED NOT DETECTED Final   Sapovirus (I, II, IV, and V) NOT DETECTED NOT DETECTED Final    Comment: Performed at [facility], [address] [place], [place] [address]  C difficile quick scan w PCR reflex     Status: None   Collection Time: 05/15/18  2:31 PM  Result Value Ref Range Status   C Diff antigen NEGATIVE NEGATIVE Final   C Diff toxin NEGATIVE NEGATIVE Final   C Diff interpretation No C. difficile detected.  Final    Comment: Performed at [facility], [address] [place], [place] [address]    RADIOLOGY:  No results found.   Management plans discussed with the patient, family and they are in agreement.  CODE STATUS:     Code Status  Orders  (From admission, onward)         Start     Ordered   05/15/18 2105  Limited resuscitation (code)  Continuous    Question Answer Comment  In the event of cardiac or respiratory ARREST: Initiate Code Blue, Call Rapid Response Yes   In the event of cardiac or respiratory ARREST: Perform CPR Yes   In the event of cardiac or respiratory ARREST: Perform Intubation/Mechanical Ventilation No   In the event of cardiac or respiratory ARREST: Use NIPPV/BiPAp only if indicated Yes  In the event of cardiac or respiratory ARREST: Administer ACLS medications if indicated Yes   In the event of cardiac or respiratory ARREST: Perform Defibrillation or Cardioversion if indicated Yes      05/15/18 2104        Code Status History    Date Active Date Inactive Code Status Order ID Comments User Context   01/31/2018 1751 02/02/2018 2038 Full Code [id number]  Demetrios Loll, MD Inpatient      TOTAL TIME TAKING CARE OF THIS PATIENT: *40* minutes.    Fritzi Mandes M.D on 05/18/2018 at 12:12 PM  Between 7am to 6pm - Pager - [phone] After 6pm go to [email or web address] - password [email or web address] [facility] [facility]  Office  [phone]  CC: Primary care physician; Guadalupe Maple, MD

## 2018-05-19 ENCOUNTER — Telehealth: Payer: Self-pay

## 2018-05-19 NOTE — Telephone Encounter (Signed)
Attempted to complete TCM call; left 2 messages then spoke to daughter [name] and she stated that she moved him in to [facility] but she has not had much contact with him since and doesn't think he is staying there. She was able to provide an alternate mobile number and when I tried that number there was no answer. She did confirm his upcoming appt with Dr. Jeananne Rama on 06/02/18 at 10:30 am. Timothy [name] to contact office if appt needs to be rescheduled.
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
## 2018-06-02 ENCOUNTER — Inpatient Hospital Stay: Payer: Medicare Other | Admitting: Family Medicine

## 2018-06-02 ENCOUNTER — Telehealth: Payer: Self-pay | Admitting: Family Medicine

## 2018-06-02 NOTE — Telephone Encounter (Signed)
Glea Haithcock pt's daughter also wanted it to be known that his sitter was suppose to bring him and he is no longer living at home.

## 2018-06-02 NOTE — Telephone Encounter (Signed)
Phone call Discussed with patient's daughter very concerned about patient's care and caregiving has not followed doctors notes from neurology to taper and discontinue Sinemet as may be contributing to some behavioral changes the patient is experiencing. Discussed [place] with patient's daughter as a Theatre manager.

## 2018-06-02 NOTE — Telephone Encounter (Signed)
Timothy [name] came in wanting to speak with you but you were seeing patients would like a call back at [phone] at anytime. She came in but her father did not show up.

## 2018-06-02 NOTE — Telephone Encounter (Signed)
Call daughter

## 2018-06-02 NOTE — Telephone Encounter (Signed)
Schedule with another provider

## 2018-06-02 NOTE — Telephone Encounter (Signed)
Copied from [place] [id number]. Topic: Appointment Scheduling - Scheduling Inquiry for Clinic >> Jun 02, 2018 12:13 PM Timothy [name] wrote: Reason for CRM:  Patients caretaker Timothy [name] called to check time of appointment but was informed that the appointment was missed from earlier in the morning. Wanted to reschedule but nothing available until 07/13/18 so would like to know if patient can be worked in or come in and see another provider. Patient has [name] scheduled appointment for 07/15/18. Please advise   Timothy [name] # [phone]  [facility]..# [phone] >> Jun 02, 2018  1:01 PM Shaune Pollack wrote: Patient missed the appointment today and caregiver Timothy [name] is requesting another appointment there are none available with Dr Jeananne Rama.  Patient has an appt scheduled in January.  Please advise if you think the patient needs to be seen before and if there is [name] time we can add the patient on to your schedule.  Thank you

## 2018-06-14 ENCOUNTER — Encounter: Payer: Self-pay | Admitting: Family Medicine

## 2018-06-14 ENCOUNTER — Ambulatory Visit (INDEPENDENT_AMBULATORY_CARE_PROVIDER_SITE_OTHER): Payer: Medicare Other | Admitting: Family Medicine

## 2018-06-14 DIAGNOSIS — I10 Essential (primary) hypertension: Secondary | ICD-10-CM | POA: Diagnosis not present

## 2018-06-14 DIAGNOSIS — G20 Parkinson's disease: Secondary | ICD-10-CM | POA: Diagnosis not present

## 2018-06-14 DIAGNOSIS — G20C Parkinsonism, unspecified: Secondary | ICD-10-CM | POA: Insufficient documentation

## 2018-06-14 DIAGNOSIS — E1159 Type 2 diabetes mellitus with other circulatory complications: Secondary | ICD-10-CM | POA: Diagnosis not present

## 2018-06-14 NOTE — Progress Notes (Signed)
   BP (!) 160/93   Pulse 98   Temp 98.3 F (36.8 C) (Oral)   SpO2 99%    Subjective:    Patient ID: Timothy [name], male    DOB: 05-18-1929, 84 y.o.   MRN: [id number]  HPI: Timothy [name] is a 84 y.o. male  Chief Complaint  Patient presents with  . Hospitalization Follow-up  Patient all in all doing well using a walker today is here by himself. Patient follow-up hospitalization for food poisoning.  Has recovered well is eating again and doing well with his diet.  Patient had lost a lot of fluid weight and not having any chest pain chest tightness PND or orthopnea. Blood pressure diabetes doing well with no complaints. Patient still taking Sinemet has not tapered. Is using CBD oil capsules which seems to have helped his tremor a great deal.   Relevant past medical, surgical, family and social history reviewed and updated as indicated. Interim medical history since our last visit reviewed. Allergies and medications reviewed and updated.  Review of Systems  Constitutional: Negative.   Respiratory: Negative.   Cardiovascular: Negative.     Per HPI unless specifically indicated above     Objective:    BP (!) 160/93   Pulse 98   Temp 98.3 F (36.8 C) (Oral)   SpO2 99%   Wt Readings from Last 3 Encounters:  05/15/18 164 lb 3.9 oz (74.5 kg)  05/04/18 235 lb (106.6 kg)  04/08/18 169 lb (76.7 kg)    Physical Exam  Constitutional: He is oriented to person, place, and time. He appears well-developed and well-nourished.  HENT:  Head: Normocephalic and atraumatic.  Eyes: Conjunctivae and EOM are normal.  Neck: Normal range of motion.  Cardiovascular: Normal rate, regular rhythm and normal heart sounds.  Pulmonary/Chest: Effort normal and breath sounds normal.  Musculoskeletal: Normal range of motion.  Neurological: He is alert and oriented to person, place, and time.  Skin: No erythema.  Psychiatric: He has a normal mood and affect. His behavior is normal. Judgment and thought  content normal.        Assessment & Plan:   Problem List Items Addressed This Visit      Cardiovascular and Mediastinum   Essential hypertension    The current medical regimen is effective;  continue present plan and medications.         Endocrine   DM type 2 (diabetes mellitus, type 2) ([place])    The current medical regimen is effective;  continue present plan and medications.         Nervous and Auditory   Parkinson disease ([place])    Stable reviewed neurology recommendations of tapering and stopping medications.  Discussed again with patient about doing this.          Follow up plan: Return if symptoms worsen or fail to improve, for As scheduled.

## 2018-06-14 NOTE — Assessment & Plan Note (Signed)
The current medical regimen is effective;  continue present plan and medications.  

## 2018-06-14 NOTE — Assessment & Plan Note (Signed)
Stable reviewed neurology recommendations of tapering and stopping medications.  Discussed again with patient about doing this.

## 2018-06-24 DIAGNOSIS — H35372 Puckering of macula, left eye: Secondary | ICD-10-CM | POA: Diagnosis not present

## 2018-06-24 DIAGNOSIS — H04129 Dry eye syndrome of unspecified lacrimal gland: Secondary | ICD-10-CM | POA: Diagnosis not present

## 2018-06-24 DIAGNOSIS — H16103 Unspecified superficial keratitis, bilateral: Secondary | ICD-10-CM | POA: Diagnosis not present

## 2018-06-24 DIAGNOSIS — E119 Type 2 diabetes mellitus without complications: Secondary | ICD-10-CM | POA: Diagnosis not present

## 2018-06-25 ENCOUNTER — Other Ambulatory Visit: Payer: Self-pay | Admitting: Family Medicine

## 2018-06-25 NOTE — Telephone Encounter (Signed)
Appointment 08/19/18

## 2018-07-15 ENCOUNTER — Ambulatory Visit: Payer: Medicare Other | Admitting: Family Medicine

## 2018-07-21 ENCOUNTER — Other Ambulatory Visit: Payer: Self-pay | Admitting: Family Medicine

## 2018-07-21 DIAGNOSIS — R1310 Dysphagia, unspecified: Secondary | ICD-10-CM

## 2018-07-21 DIAGNOSIS — E1159 Type 2 diabetes mellitus with other circulatory complications: Secondary | ICD-10-CM

## 2018-08-18 ENCOUNTER — Other Ambulatory Visit: Payer: Self-pay | Admitting: Cardiovascular Disease

## 2018-08-18 ENCOUNTER — Other Ambulatory Visit: Payer: Self-pay | Admitting: Family Medicine

## 2018-08-18 DIAGNOSIS — E785 Hyperlipidemia, unspecified: Secondary | ICD-10-CM

## 2018-08-19 ENCOUNTER — Ambulatory Visit: Payer: Medicare Other | Admitting: Family Medicine

## 2018-10-05 ENCOUNTER — Other Ambulatory Visit: Payer: Self-pay

## 2018-10-05 ENCOUNTER — Other Ambulatory Visit: Payer: Self-pay | Admitting: Family Medicine

## 2018-10-05 DIAGNOSIS — E1159 Type 2 diabetes mellitus with other circulatory complications: Secondary | ICD-10-CM

## 2018-10-05 DIAGNOSIS — E785 Hyperlipidemia, unspecified: Secondary | ICD-10-CM

## 2018-10-05 MED ORDER — DAPAGLIFLOZIN PROPANEDIOL 10 MG PO TABS: 10.0000 mg | ORAL_TABLET | Freq: Every day | ORAL | 6 refills | Status: DC

## 2018-10-05 MED ORDER — ROSUVASTATIN CALCIUM 10 MG PO TABS: 10.0000 mg | ORAL_TABLET | Freq: Every day | ORAL | 6 refills | Status: DC

## 2018-10-05 MED ORDER — GLIPIZIDE-METFORMIN HCL 5-500 MG PO TABS: 1.0000 | ORAL_TABLET | Freq: Two times a day (BID) | ORAL | 1 refills | Status: DC

## 2018-10-05 NOTE — Telephone Encounter (Signed)
Requested medication (s) are due for refill today: Yes  Requested medication (s) are on the active medication list: Yes  Last refill:  04/08/18  Future visit scheduled: No  Notes to clinic:  Historical provider.    Requested Prescriptions  Pending Prescriptions Disp Refills   sertraline (ZOLOFT) 50 MG tablet [Pharmacy Med Name: SERTRALINE HCL 50 MG TAB] 30 tablet     Sig: TAKE ONE TABLET BY MOUTH NIGHTLY     Psychiatry:  Antidepressants - SSRI Passed - 10/05/2018 11:10 AM      Passed - Valid encounter within last 6 months    Recent Outpatient Visits          3 months ago Essential hypertension   [place] [facility], Jeannette How, MD   6 months ago Essential hypertension   [place] [facility], Jeannette How, MD   8 months ago Hospital discharge follow-up   [place], [place], PA-C   9 months ago Essential hypertension   [place], Jeannette How, MD   1 year ago Essential hypertension   [facility], Jeannette How, MD

## 2018-10-05 NOTE — Telephone Encounter (Signed)
Call pt needs to be put on the schedual

## 2018-10-05 NOTE — Telephone Encounter (Signed)
Requested medication (s) are due for refill today: yes       Requested medication (s) are on the active medication list: yes  Last refill:  06/25/2018  Future visit scheduled: no  Notes to clinic:  Anticoagulants failed  Requested Prescriptions  Pending Prescriptions Disp Refills   ELIQUIS 5 MG TABS tablet [Pharmacy Med Name: ELIQUIS 5 MG TAB] 60 tablet 2    Sig: TAKE ONE TABLET (5 MG) BY MOUTH TWICE DAILY     Hematology:  Anticoagulants Failed - 10/05/2018  7:59 AM      Failed - HGB in normal range and within 360 days    Hemoglobin  Date Value Ref Range Status  05/16/2018 12.1 (L) 13.0 - 17.0 g/dL Final  12/10/2017 11.1 (L) 13.0 - 17.7 g/dL Final         Passed - PLT in normal range and within 360 days    Platelets  Date Value Ref Range Status  05/16/2018 151 150 - 400 K/uL Final  12/10/2017 222 150 - 450 x10E3/uL Final         Passed - HCT in normal range and within 360 days    HCT  Date Value Ref Range Status  05/16/2018 39.4 39.0 - 52.0 % Final   Hematocrit  Date Value Ref Range Status  12/10/2017 34.5 (L) 37.5 - 51.0 % Final         Passed - Cr in normal range and within 360 days    Creatinine  Date Value Ref Range Status  12/24/2011 1.30 0.60 - 1.30 mg/dL Final   Creatinine, Ser  Date Value Ref Range Status  05/16/2018 1.18 0.61 - 1.24 mg/dL Final         Passed - Valid encounter within last 12 months    Recent Outpatient Visits          3 months ago Essential hypertension   [place] Crissman, Jeannette How, MD   6 months ago Essential hypertension   [place] Crissman, Jeannette How, MD   8 months ago Hospital discharge follow-up   [place], [place], PA-C   9 months ago Essential hypertension   [place], Jeannette How, MD   1 year ago Essential hypertension   [facility], Jeannette How, MD

## 2018-10-27 ENCOUNTER — Other Ambulatory Visit: Payer: Self-pay | Admitting: Family Medicine

## 2018-10-27 ENCOUNTER — Other Ambulatory Visit: Payer: Self-pay | Admitting: Cardiovascular Disease

## 2018-10-27 DIAGNOSIS — R1310 Dysphagia, unspecified: Secondary | ICD-10-CM

## 2018-11-08 ENCOUNTER — Other Ambulatory Visit: Payer: Self-pay | Admitting: Family Medicine

## 2018-11-08 DIAGNOSIS — E1159 Type 2 diabetes mellitus with other circulatory complications: Secondary | ICD-10-CM

## 2018-11-08 DIAGNOSIS — D510 Vitamin B12 deficiency anemia due to intrinsic factor deficiency: Secondary | ICD-10-CM

## 2018-11-10 ENCOUNTER — Ambulatory Visit: Payer: Self-pay | Admitting: *Deleted

## 2018-11-10 NOTE — Chronic Care Management (AMB) (Signed)
°  Chronic Care Management   Outreach Note  11/10/2018 Name: Timothy [name] MRN: [id number] DOB: [birth year]  Referred by: Guadalupe Maple, MD Reason for referral : No chief complaint on file.   An unsuccessful telephone outreach was attempted today. The patient was referred to the case management team by for assistance with chronic care management and care coordination.   Follow Up Plan: The CM team will reach out to the patient again over the next 7 days.   [place]  [email or web address]   ??[phone]

## 2018-11-16 ENCOUNTER — Other Ambulatory Visit (HOSPITAL_COMMUNITY): Payer: Self-pay | Admitting: Cardiovascular Disease

## 2018-11-16 DIAGNOSIS — I739 Peripheral vascular disease, unspecified: Secondary | ICD-10-CM

## 2018-11-29 ENCOUNTER — Other Ambulatory Visit: Payer: Self-pay | Admitting: Cardiovascular Disease

## 2018-11-29 ENCOUNTER — Other Ambulatory Visit: Payer: Self-pay | Admitting: Family Medicine

## 2018-11-29 DIAGNOSIS — E1159 Type 2 diabetes mellitus with other circulatory complications: Secondary | ICD-10-CM

## 2018-12-06 ENCOUNTER — Other Ambulatory Visit: Payer: Self-pay

## 2018-12-06 ENCOUNTER — Other Ambulatory Visit: Payer: Medicare Other

## 2018-12-06 DIAGNOSIS — E1159 Type 2 diabetes mellitus with other circulatory complications: Secondary | ICD-10-CM | POA: Diagnosis not present

## 2018-12-06 DIAGNOSIS — D510 Vitamin B12 deficiency anemia due to intrinsic factor deficiency: Secondary | ICD-10-CM

## 2018-12-06 LAB — BAYER DCA HB A1C WAIVED: HB A1C (BAYER DCA - WAIVED): 7.7 % — ABNORMAL HIGH (ref <7.0)

## 2018-12-07 ENCOUNTER — Encounter: Payer: Self-pay | Admitting: Family Medicine

## 2018-12-07 ENCOUNTER — Other Ambulatory Visit: Payer: Medicare Other

## 2018-12-07 LAB — BASIC METABOLIC PANEL
BUN/Creatinine Ratio: 12 (ref 10–24)
BUN: 14 mg/dL (ref 8–27)
CO2: 20 mmol/L (ref 20–29)
Calcium: 9.2 mg/dL (ref 8.6–10.2)
Chloride: 100 mmol/L (ref 96–106)
Creatinine, Ser: 1.2 mg/dL (ref 0.76–1.27)
GFR calc Af Amer: 62 mL/min/{1.73_m2} (ref >59)
GFR calc non Af Amer: 54 mL/min/{1.73_m2} — ABNORMAL LOW (ref >59)
Glucose: 180 mg/dL — ABNORMAL HIGH (ref 65–99)
Potassium: 4.3 mmol/L (ref 3.5–5.2)
Sodium: 137 mmol/L (ref 134–144)

## 2018-12-07 LAB — CBC WITH DIFFERENTIAL/PLATELET
Basophils Absolute: 0.1 10*3/uL (ref 0.0–0.2)
Basos: 1 %
EOS (ABSOLUTE): 0.3 10*3/uL (ref 0.0–0.4)
Eos: 4 %
Hematocrit: 38.2 % (ref 37.5–51.0)
Hemoglobin: 12.2 g/dL — ABNORMAL LOW (ref 13.0–17.7)
Immature Grans (Abs): 0.1 10*3/uL (ref 0.0–0.1)
Immature Granulocytes: 1 %
Lymphocytes Absolute: 1.9 10*3/uL (ref 0.7–3.1)
Lymphs: 23 %
MCH: 28.6 pg (ref 26.6–33.0)
MCHC: 31.9 g/dL (ref 31.5–35.7)
MCV: 90 fL (ref 79–97)
Monocytes Absolute: 0.9 10*3/uL (ref 0.1–0.9)
Monocytes: 11 %
Neutrophils Absolute: 4.7 10*3/uL (ref 1.4–7.0)
Neutrophils: 60 %
Platelets: 201 10*3/uL (ref 150–450)
RBC: 4.26 x10E6/uL (ref 4.14–5.80)
RDW: 14.5 % (ref 11.6–15.4)
WBC: 7.9 10*3/uL (ref 3.4–10.8)

## 2018-12-14 DIAGNOSIS — D2372 Other benign neoplasm of skin of left lower limb, including hip: Secondary | ICD-10-CM | POA: Diagnosis not present

## 2018-12-14 DIAGNOSIS — E1151 Type 2 diabetes mellitus with diabetic peripheral angiopathy without gangrene: Secondary | ICD-10-CM | POA: Diagnosis not present

## 2018-12-14 DIAGNOSIS — B351 Tinea unguium: Secondary | ICD-10-CM | POA: Diagnosis not present

## 2018-12-16 ENCOUNTER — Other Ambulatory Visit: Payer: Self-pay

## 2018-12-16 ENCOUNTER — Ambulatory Visit (INDEPENDENT_AMBULATORY_CARE_PROVIDER_SITE_OTHER): Payer: Medicare Other | Admitting: Family Medicine

## 2018-12-16 ENCOUNTER — Encounter: Payer: Self-pay | Admitting: Family Medicine

## 2018-12-16 DIAGNOSIS — R1310 Dysphagia, unspecified: Secondary | ICD-10-CM | POA: Diagnosis not present

## 2018-12-16 DIAGNOSIS — D510 Vitamin B12 deficiency anemia due to intrinsic factor deficiency: Secondary | ICD-10-CM

## 2018-12-16 DIAGNOSIS — I714 Abdominal aortic aneurysm, without rupture, unspecified: Secondary | ICD-10-CM

## 2018-12-16 DIAGNOSIS — E785 Hyperlipidemia, unspecified: Secondary | ICD-10-CM | POA: Diagnosis not present

## 2018-12-16 DIAGNOSIS — I509 Heart failure, unspecified: Secondary | ICD-10-CM | POA: Diagnosis not present

## 2018-12-16 DIAGNOSIS — G20 Parkinson's disease: Secondary | ICD-10-CM

## 2018-12-16 DIAGNOSIS — G20A1 Parkinson's disease without dyskinesia, without mention of fluctuations: Secondary | ICD-10-CM

## 2018-12-16 DIAGNOSIS — E1159 Type 2 diabetes mellitus with other circulatory complications: Secondary | ICD-10-CM

## 2018-12-16 DIAGNOSIS — I480 Paroxysmal atrial fibrillation: Secondary | ICD-10-CM | POA: Diagnosis not present

## 2018-12-16 DIAGNOSIS — I471 Supraventricular tachycardia, unspecified: Secondary | ICD-10-CM

## 2018-12-16 MED ORDER — CYANOCOBALAMIN 1000 MCG/ML IJ SOLN: INTRAMUSCULAR | 12 refills | Status: DC

## 2018-12-16 MED ORDER — SITAGLIPTIN PHOSPHATE 100 MG PO TABS: 100.0000 mg | ORAL_TABLET | Freq: Every day | ORAL | 4 refills | Status: DC

## 2018-12-16 MED ORDER — ROSUVASTATIN CALCIUM 10 MG PO TABS: 10.0000 mg | ORAL_TABLET | Freq: Every day | ORAL | 12 refills | Status: DC

## 2018-12-16 MED ORDER — SERTRALINE HCL 50 MG PO TABS: 50.0000 mg | ORAL_TABLET | Freq: Every day | ORAL | 12 refills | Status: DC

## 2018-12-16 MED ORDER — METOPROLOL SUCCINATE ER 50 MG PO TB24: 50.0000 mg | ORAL_TABLET | Freq: Every day | ORAL | 12 refills | Status: DC

## 2018-12-16 MED ORDER — FARXIGA 10 MG PO TABS: 10.0000 mg | ORAL_TABLET | Freq: Every day | ORAL | 12 refills | Status: DC

## 2018-12-16 MED ORDER — SITAGLIPTIN PHOSPHATE 100 MG PO TABS: 100.0000 mg | ORAL_TABLET | Freq: Every day | ORAL | 12 refills | Status: DC

## 2018-12-16 MED ORDER — GLIPIZIDE-METFORMIN HCL 5-500 MG PO TABS: 1.0000 | ORAL_TABLET | Freq: Two times a day (BID) | ORAL | 4 refills | Status: DC

## 2018-12-16 MED ORDER — PANTOPRAZOLE SODIUM 40 MG PO TBEC: 40.0000 mg | DELAYED_RELEASE_TABLET | Freq: Two times a day (BID) | ORAL | 4 refills | Status: DC

## 2018-12-16 MED ORDER — APIXABAN 5 MG PO TABS: 5.0000 mg | ORAL_TABLET | Freq: Two times a day (BID) | ORAL | 5 refills | Status: DC

## 2018-12-16 NOTE — Assessment & Plan Note (Signed)
Stable with no further episodes

## 2018-12-16 NOTE — Assessment & Plan Note (Signed)
The current medical regimen is effective;  continue present plan and medications.  

## 2018-12-16 NOTE — Assessment & Plan Note (Signed)
Stable no c/o

## 2018-12-16 NOTE — Progress Notes (Signed)
There were no vitals taken for this visit.   Subjective:    Patient ID: Timothy [name], male    DOB: 12-25-1928, 85 y.o.   MRN: [id number]  HPI: Timothy [name] is a 85 y.o. male  Med check  Patient follow-up all in all doing much better no complaints from medications feels stable and no bleeding bruising issues taking medications faithfully without problems good diabetes control. Good checkups from heart doctor with no complaints. Stable depression with no concerns.  Relevant past medical, surgical, family and social history reviewed and updated as indicated. Interim medical history since our last visit reviewed. Allergies and medications reviewed and updated.  Review of Systems  Constitutional: Negative.   HENT: Negative.   Eyes: Negative.   Respiratory: Negative.   Cardiovascular: Negative.   Gastrointestinal: Negative.   Endocrine: Negative.   Genitourinary: Negative.   Musculoskeletal: Negative.   Skin: Negative.   Allergic/Immunologic: Negative.   Neurological: Negative.   Hematological: Negative.   Psychiatric/Behavioral: Negative.     Per HPI unless specifically indicated above     Objective:    There were no vitals taken for this visit.  Wt Readings from Last 3 Encounters:  05/15/18 164 lb 3.9 oz (74.5 kg)  05/04/18 235 lb (106.6 kg)  04/08/18 169 lb (76.7 kg)    Physical Exam  Results for orders placed or performed in visit on 12/06/18  Bayer DCA Hb A1c Waived  Result Value Ref Range   HB A1C (BAYER DCA - WAIVED) 7.7 (H) <7.0 %  CBC with Differential/Platelet  Result Value Ref Range   WBC 7.9 3.4 - 10.8 x10E3/uL   RBC 4.26 4.14 - 5.80 x10E6/uL   Hemoglobin 12.2 (L) 13.0 - 17.7 g/dL   Hematocrit 38.2 37.5 - 51.0 %   MCV 90 79 - 97 fL   MCH 28.6 26.6 - 33.0 pg   MCHC 31.9 31.5 - 35.7 g/dL   RDW 14.5 11.6 - 15.4 %   Platelets 201 150 - 450 x10E3/uL   Neutrophils 60 Not Estab. %   Lymphs 23 Not Estab. %   Monocytes 11 Not Estab. %   Eos 4 Not  Estab. %   Basos 1 Not Estab. %   Neutrophils Absolute 4.7 1.4 - 7.0 x10E3/uL   Lymphocytes Absolute 1.9 0.7 - 3.1 x10E3/uL   Monocytes Absolute 0.9 0.1 - 0.9 x10E3/uL   EOS (ABSOLUTE) 0.3 0.0 - 0.4 x10E3/uL   Basophils Absolute 0.1 0.0 - 0.2 x10E3/uL   Immature Granulocytes 1 Not Estab. %   Immature Grans (Abs) 0.1 0.0 - 0.1 [id number]  Basic metabolic panel  Result Value Ref Range   Glucose 180 (H) 65 - 99 mg/dL   BUN 14 8 - 27 mg/dL   Creatinine, Ser 1.20 0.76 - 1.27 mg/dL   GFR calc non Af Amer 54 (L) >59 mL/min/1.73   GFR calc Af Amer 62 >59 mL/min/1.73   BUN/Creatinine Ratio 12 10 - 24   Sodium 137 134 - 144 mmol/L   Potassium 4.3 3.5 - 5.2 mmol/L   Chloride 100 96 - 106 mmol/L   CO2 20 20 - 29 mmol/L   Calcium 9.2 8.6 - 10.2 mg/dL      Assessment & Plan:   Problem List Items Addressed This Visit      Cardiovascular and Mediastinum   AAA (abdominal aortic aneurysm) without rupture (HCC)    Stable no c/o      Relevant Medications   metoprolol succinate (TOPROL-XL)  50 MG 24 hr tablet   apixaban (ELIQUIS) 5 MG TABS tablet   rosuvastatin (CRESTOR) 10 MG tablet   SVT (supraventricular tachycardia) (HCC)    Stable with no further episodes      Relevant Medications   metoprolol succinate (TOPROL-XL) 50 MG 24 hr tablet   apixaban (ELIQUIS) 5 MG TABS tablet   rosuvastatin (CRESTOR) 10 MG tablet   Other Relevant Orders   TSH   Paroxysmal atrial fibrillation (HCC)    The current medical regimen is effective;  continue present plan and medications.       Relevant Medications   metoprolol succinate (TOPROL-XL) 50 MG 24 hr tablet   apixaban (ELIQUIS) 5 MG TABS tablet   rosuvastatin (CRESTOR) 10 MG tablet   Other Relevant Orders   TSH   CHF (congestive heart failure) ([place])    The current medical regimen is effective;  continue present plan and medications.       Relevant Medications   metoprolol succinate (TOPROL-XL) 50 MG 24 hr tablet   apixaban (ELIQUIS) 5 MG  TABS tablet   rosuvastatin (CRESTOR) 10 MG tablet     Digestive   Dysphagia   Relevant Medications   pantoprazole (PROTONIX) 40 MG tablet     Endocrine   DM type 2 (diabetes mellitus, type 2) (HCC)   Relevant Medications   sitaGLIPtin (JANUVIA) 100 MG tablet   glipiZIDE-metformin (METAGLIP) 5-500 MG tablet   rosuvastatin (CRESTOR) 10 MG tablet   dapagliflozin propanediol (FARXIGA) 10 MG TABS tablet   sitaGLIPtin (JANUVIA) 100 MG tablet   Other Relevant Orders   Comprehensive metabolic panel   CBC with Differential/Platelet   TSH   Urinalysis, Routine w reflex microscopic     Nervous and Auditory   Parkinson disease ([place])    The current medical regimen is effective;  continue present plan and medications.         Other   HLD (hyperlipidemia)   Relevant Medications   metoprolol succinate (TOPROL-XL) 50 MG 24 hr tablet   apixaban (ELIQUIS) 5 MG TABS tablet   rosuvastatin (CRESTOR) 10 MG tablet   Other Relevant Orders   Lipid panel   Pernicious anemia   Relevant Medications   cyanocobalamin (,VITAMIN B-12,) 1000 MCG/ML injection   Other Relevant Orders   Comprehensive metabolic panel   CBC with Differential/Platelet      Telemedicine using audio/video telecommunications for a synchronous communication visit. Today's visit due to COVID-19 isolation precautions I connected with and verified that I am speaking with the correct person using two identifiers.   I discussed the limitations, risks, security and privacy concerns of performing an evaluation and management service by telecommunication and the availability of in person appointments. I also discussed with the patient that there may be a patient responsible charge related to this service. The patient expressed understanding and agreed to proceed. The patient's location is I am at home.   I discussed the assessment and treatment plan with the patient. The patient was provided an opportunity to ask questions and all  were answered. The patient agreed with the plan and demonstrated an understanding of the instructions.   The patient was advised to call back or seek an in-person evaluation if the symptoms worsen or if the condition fails to improve as anticipated.   I provided 21+ minutes of time during this encounter.  Follow up plan: Return in about 6 months (around 06/17/2019) for Hemoglobin A1c. Patient will also get physical in office with blood work.

## 2018-12-18 ENCOUNTER — Other Ambulatory Visit: Payer: Self-pay | Admitting: Cardiovascular Disease

## 2018-12-20 ENCOUNTER — Telehealth: Payer: Self-pay

## 2018-12-20 NOTE — Telephone Encounter (Signed)
Call attempted to schedule overdue F/U with Dr. Rockey Situ. No answer-unable to leave message.

## 2019-01-06 ENCOUNTER — Telehealth: Payer: Self-pay | Admitting: Family Medicine

## 2019-01-06 NOTE — Chronic Care Management (AMB) (Signed)
°  Chronic Care Management   Outreach Note  01/06/2019 Name: Timothy [name] MRN: [id number] DOB: 1929/04/21  Referred by: Guadalupe Maple, MD Reason for referral : Chronic Care Management (Second CCM outreach was unsuccessful. )   A second unsuccessful telephone outreach was attempted today. The patient was referred to the case management team for assistance with chronic care management and care coordination.   Follow Up Plan: A HIPPA compliant phone message was left for the patient providing contact information and requesting a return call.  The care management team will reach out to the patient again over the next 7 days.  If patient returns call to provider office, please advise to call [place] at [place]  [email or web address]   ??[phone]

## 2019-01-14 NOTE — Chronic Care Management (AMB) (Signed)
°  Chronic Care Management   Outreach Note  01/14/2019 Name: Timothy [name] MRN: [id number] DOB: 1928-07-16  Referred by: Guadalupe Maple, MD Reason for referral : Chronic Care Management (Second CCM outreach was unsuccessful. ) and Chronic Care Management (Third CCM outreach was unsuccessful.)   Third unsuccessful telephone outreach was attempted today. The patient was referred to the case management team for assistance with chronic care management and care coordination. The patient's primary care provider has been notified of our unsuccessful attempts to make or maintain contact with the patient. The care management team is pleased to engage with this patient at any time in the future should he/she be interested in assistance from the care management team.   Follow Up Plan: The care management team is available to follow up with the patient after provider conversation with the patient regarding recommendation for care management engagement and subsequent re-referral to the care management team.   [place]  [email or web address]   ??[phone]

## 2019-02-01 DIAGNOSIS — H43813 Vitreous degeneration, bilateral: Secondary | ICD-10-CM | POA: Diagnosis not present

## 2019-02-08 ENCOUNTER — Ambulatory Visit: Payer: Medicare Other | Admitting: Physician Assistant

## 2019-02-17 ENCOUNTER — Other Ambulatory Visit: Payer: Self-pay

## 2019-02-17 ENCOUNTER — Encounter: Payer: Self-pay | Admitting: Intensive Care

## 2019-02-17 ENCOUNTER — Emergency Department
Admission: EM | Admit: 2019-02-17 | Discharge: 2019-02-17 | Disposition: A | Discharge status: Home / Self Care | Payer: Medicare Other | Attending: Emergency Medicine | Admitting: Emergency Medicine

## 2019-02-17 DIAGNOSIS — I959 Hypotension, unspecified: Secondary | ICD-10-CM | POA: Diagnosis not present

## 2019-02-17 DIAGNOSIS — Z7984 Long term (current) use of oral hypoglycemic drugs: Secondary | ICD-10-CM | POA: Diagnosis not present

## 2019-02-17 DIAGNOSIS — Z8673 Personal history of transient ischemic attack (TIA), and cerebral infarction without residual deficits: Secondary | ICD-10-CM | POA: Insufficient documentation

## 2019-02-17 DIAGNOSIS — I509 Heart failure, unspecified: Secondary | ICD-10-CM | POA: Insufficient documentation

## 2019-02-17 DIAGNOSIS — I110 Hypertensive heart disease with heart failure: Secondary | ICD-10-CM | POA: Insufficient documentation

## 2019-02-17 DIAGNOSIS — Z85828 Personal history of other malignant neoplasm of skin: Secondary | ICD-10-CM | POA: Insufficient documentation

## 2019-02-17 DIAGNOSIS — R42 Dizziness and giddiness: Secondary | ICD-10-CM | POA: Diagnosis not present

## 2019-02-17 DIAGNOSIS — I4510 Unspecified right bundle-branch block: Secondary | ICD-10-CM | POA: Diagnosis not present

## 2019-02-17 DIAGNOSIS — G20 Parkinson's disease: Secondary | ICD-10-CM | POA: Diagnosis not present

## 2019-02-17 DIAGNOSIS — R55 Syncope and collapse: Secondary | ICD-10-CM | POA: Diagnosis not present

## 2019-02-17 DIAGNOSIS — Z79899 Other long term (current) drug therapy: Secondary | ICD-10-CM | POA: Insufficient documentation

## 2019-02-17 DIAGNOSIS — Z7901 Long term (current) use of anticoagulants: Secondary | ICD-10-CM | POA: Diagnosis not present

## 2019-02-17 DIAGNOSIS — E119 Type 2 diabetes mellitus without complications: Secondary | ICD-10-CM | POA: Diagnosis not present

## 2019-02-17 LAB — CBC
HCT: 37 % — ABNORMAL LOW (ref 39.0–52.0)
Hemoglobin: 11.6 g/dL — ABNORMAL LOW (ref 13.0–17.0)
MCH: 28.2 pg (ref 26.0–34.0)
MCHC: 31.4 g/dL (ref 30.0–36.0)
MCV: 90 fL (ref 80.0–100.0)
Platelets: 182 10*3/uL (ref 150–400)
RBC: 4.11 MIL/uL — ABNORMAL LOW (ref 4.22–5.81)
RDW: 14.7 % (ref 11.5–15.5)
WBC: 8 10*3/uL (ref 4.0–10.5)
nRBC: 0 % (ref 0.0–0.2)

## 2019-02-17 LAB — COMPREHENSIVE METABOLIC PANEL
ALT: 11 U/L (ref 0–44)
AST: 27 U/L (ref 15–41)
Albumin: 3.8 g/dL (ref 3.5–5.0)
Alkaline Phosphatase: 52 U/L (ref 38–126)
Anion gap: 11 (ref 5–15)
BUN: 18 mg/dL (ref 8–23)
CO2: 23 mmol/L (ref 22–32)
Calcium: 8.7 mg/dL — ABNORMAL LOW (ref 8.9–10.3)
Chloride: 104 mmol/L (ref 98–111)
Creatinine, Ser: 1.36 mg/dL — ABNORMAL HIGH (ref 0.61–1.24)
GFR calc Af Amer: 54 mL/min — ABNORMAL LOW (ref >60)
GFR calc non Af Amer: 46 mL/min — ABNORMAL LOW (ref >60)
Glucose, Bld: 261 mg/dL — ABNORMAL HIGH (ref 70–99)
Potassium: 3.9 mmol/L (ref 3.5–5.1)
Sodium: 138 mmol/L (ref 135–145)
Total Bilirubin: 0.6 mg/dL (ref 0.3–1.2)
Total Protein: 6.9 g/dL (ref 6.5–8.1)

## 2019-02-17 LAB — TROPONIN I (HIGH SENSITIVITY)
Troponin I (High Sensitivity): 9 ng/L (ref <18)
Troponin I (High Sensitivity): 8 ng/L (ref <18)

## 2019-02-17 MED ORDER — SODIUM CHLORIDE 0.9 % IV BOLUS
500.0000 mL | Freq: Once | INTRAVENOUS | Status: AC
  Administered 2019-02-17: 500 mL via INTRAVENOUS

## 2019-02-17 NOTE — ED Provider Notes (Signed)
[facility] Emergency Department Provider Note  Time seen: 12:25 PM  I have reviewed the triage vital signs and the nursing notes.   HISTORY  Chief Complaint Near Syncope   HPI Timothy [name] is a 85 y.o. male with a past medical history of CHF, Parkinson's, presents to the emergency department after a near syncopal episode.  According to the patient he was on the toilet straining to have a bowel movement when he became very lightheaded.  States he felt like he was going to pass out so he put his head down between his knees and felt somewhat better.  Patient states he has never fully passed out but this is happened approximately 3 or 4 times per patient always while straining to have a bowel movement.  Denies any diarrhea, denies any nausea or vomiting.  Denies any chest pain or shortness of breath at any point.  Denies any recent fever cough or congestion.   Past Medical History:  Diagnosis Date  . Back pain   . CHF (congestive heart failure) ([place])   . Esophageal reflux   . 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 Senile osteoporosis   . Skin cancer   . TIA (transient ischemic attack)     Patient Active Problem List   Diagnosis Date Noted  . CHF (congestive heart failure) ([place])   . Parkinson disease ([place]) 06/14/2018  . Gastroenteritis 05/17/2018  . Campylobacter gastroenteritis 05/15/2018  . Behavioral change 03/25/2018  . Aortic valve stenosis 02/18/2018  . Near syncope 01/31/2018  . Contusion of left elbow 01/01/2018  . Dysphagia 12/10/2017  . Advanced care planning/counseling discussion 11/27/2016  . Pernicious anemia 05/28/2015  . Leg weakness, bilateral 04/12/2015  . Tremor 03/27/2015  . Chronic anxiety 02/20/2015  . SVT (supraventricular tachycardia) ([place]) 02/16/2015  . Paroxysmal atrial fibrillation ([place]) 02/16/2015  . History of back surgery 02/16/2015  . AAA (abdominal aortic aneurysm) without rupture ([place])  10/07/2012  . TIA (transient ischemic attack) 12/29/2011  . HLD (hyperlipidemia) 09/24/2011  . Essential hypertension 09/24/2011  . DM type 2 (diabetes mellitus, type 2) ([place]) 09/24/2011    Past Surgical History:  Procedure Laterality Date  . APPENDECTOMY    . BACK SURGERY    . CATARACT EXTRACTION    . COLONOSCOPY  [id number]  . ESOPHAGEAL DILATION    . GALLBLADDER SURGERY    . HERNIA REPAIR    . RETINAL DETACHMENT SURGERY    . TONSILLECTOMY      Prior to Admission medications   Medication Sig Start Date End Date Taking? Authorizing Provider  acetaminophen (TYLENOL) 500 MG tablet Take 1,000 mg by mouth every 8 (eight) hours as needed for mild pain or moderate pain.     [provider]  apixaban (ELIQUIS) 5 MG TABS tablet Take 1 tablet (5 mg total) by mouth 2 (two) times daily. 12/16/18   Guadalupe Maple, MD  Calcium Citrate-Vitamin D (CALCIUM + D PO) Take 1 tablet by mouth 2 (two) times daily.     [provider]  Cannabidiol 100 MG/ML SOLN Take by mouth as directed.    [provider]  carbidopa-levodopa (SINEMET) 25-100 MG tablet Take 1 tablet by mouth 4 (four) times daily.     [provider]  cyanocobalamin (,VITAMIN B-12,) 1000 MCG/ML injection INJECT 1 ML INTO THE MUSCLE EVERY 30 DAYS 12/16/18   Guadalupe Maple, MD  dapagliflozin propanediol (FARXIGA) 10 MG TABS tablet Take  10 mg by mouth daily. 12/16/18   Guadalupe Maple, MD  flecainide (TAMBOCOR) 50 MG tablet TAKE ONE TABLET TWICE DAILY 12/20/18   [name], MD  glipiZIDE-metformin (METAGLIP) 5-500 MG tablet Take 1 tablet by mouth 2 (two) times daily. 12/16/18   Guadalupe Maple, MD  metoprolol succinate (TOPROL-XL) 50 MG 24 hr tablet Take 1 tablet (50 mg total) by mouth daily. Take with or immediately following a meal. 12/16/18   Crissman, Jeannette How, MD  Multiple Vitamins-Minerals (CEROVITE SENIOR PO) Take 1 tablet by mouth daily.    [provider]  pantoprazole (PROTONIX) 40 MG  tablet Take 1 tablet (40 mg total) by mouth 2 (two) times daily. 12/16/18   Guadalupe Maple, MD  Probiotic Product (PHILLIPS COLON HEALTH PO) Take 1 capsule by mouth daily.     [provider]  rosuvastatin (CRESTOR) 10 MG tablet Take 1 tablet (10 mg total) by mouth daily. 12/16/18   Guadalupe Maple, MD  sertraline (ZOLOFT) 50 MG tablet Take 1 tablet (50 mg total) by mouth at bedtime. 12/16/18   Guadalupe Maple, MD  sitaGLIPtin (JANUVIA) 100 MG tablet Take 1 tablet (100 mg total) by mouth daily. 12/16/18   Guadalupe Maple, MD  sitaGLIPtin (JANUVIA) 100 MG tablet Take 1 tablet (100 mg total) by mouth daily. 12/16/18   Guadalupe Maple, MD  torsemide (DEMADEX) 20 MG tablet Take 1 tablet (20 mg total) by mouth daily as needed. 04/09/17   [name], MD    No Known Allergies  Family History  Problem Relation Age of Onset  . Heart disease Mother   . Diabetes Other        grandparents  . Heart disease Brother   . Stroke Brother     Social History Social History   Tobacco Use  . Smoking status: Never Smoker  . Smokeless tobacco: Never Used  Substance Use Topics  . Alcohol use: No  . Drug use: No    Review of Systems Constitutional: Negative for fever. ENT: Negative for recent illness/congestion Cardiovascular: Negative for chest pain. Respiratory: Negative for shortness of breath. Gastrointestinal: Negative for abdominal pain Musculoskeletal: Negative for musculoskeletal complaints Skin: Negative for skin complaints  Neurological: Negative for headache All other ROS negative  ____________________________________________   PHYSICAL EXAM:  VITAL SIGNS: ED Triage Vitals [02/17/19 1134]  Enc Vitals Group     BP 127/67     Pulse Rate 81     Resp 17     Temp (!) 97.4 F (36.3 C)     Temp Source Oral     SpO2 98 %     Weight 167 lb (75.8 kg)     Height 5\' 8"  (1.727 m)     Head Circumference      Peak Flow      Pain Score 0     Pain Loc      Pain Edu?       Excl. in [place]?    Constitutional: Alert and oriented. Well appearing and in no distress. Eyes: Normal exam ENT      Head: Normocephalic and atraumatic.      Mouth/Throat: Mucous membranes are moist. Cardiovascular: Normal rate, regular rhythm.  Respiratory: Normal respiratory effort without tachypnea nor retractions. Breath sounds are clear  Gastrointestinal: Soft and nontender. No distention.  Musculoskeletal: Nontender with normal range of motion in all extremities.  Neurologic:  Normal speech and language. No gross focal neurologic deficits  Skin:  Skin is warm, dry and intact.  Psychiatric: Mood and affect are normal.   ____________________________________________    EKG  EKG viewed and interpreted by myself shows what appears to be sinus arrhythmia 85 bpm with a slightly widened QRS, normal axis, normal intervals with nonspecific but no concerning ST changes.  ____________________________________________  INITIAL IMPRESSION / ASSESSMENT AND PLAN / ED COURSE  Pertinent labs & imaging results that were available during my care of the patient were reviewed by me and considered in my medical decision making (see chart for details).   Patient presents to the emergency department for near syncopal episode.  Currently patient appears well, no symptoms currently.  Vitals are reassuring.  We will check labs, dose 500 cc of IV fluids and continue to closely monitor.  Differential at this time would include ACS, arrhythmia, vagal event, near syncopal event due to increased thoracic pressure.  EKG is reassuring.  Repeat troponin remains negative.  Overall the patient appears well, continues to feel well in the emergency department with no symptoms.  We will discharge the patient home.  Patient agreeable to plan of care.  Wife is here with the patient.  I have also talked to the patient's daughter on the phone.  Timothy [name] was evaluated in Emergency Department on 02/17/2019 for the symptoms  described in the history of present illness. He was evaluated in the context of the global COVID-19 pandemic, which necessitated consideration that the patient might be at risk for infection with the SARS-CoV-2 virus that causes COVID-19. Institutional protocols and algorithms that pertain to the evaluation of patients at risk for COVID-19 are in a state of rapid change based on information released by regulatory bodies including the CDC and federal and state organizations. These policies and algorithms were followed during the patient's care in the ED.  ____________________________________________   FINAL CLINICAL IMPRESSION(S) / ED DIAGNOSES  Near syncope   Harvest Dark, MD 02/17/19 1557

## 2019-02-17 NOTE — ED Triage Notes (Signed)
Patient arrived by EMS from home after feeling faint while on toilet. HX parkinson and diabetes. EMS vitals 97% O2, 70P, 242blood sugar, 99.0oral temp, 130/59b/p. A&O x4

## 2019-02-21 NOTE — Progress Notes (Deleted)
Cardiology Office Note    Date:  02/21/2019   ID:  Timothy [name], DOB [birth year], MRN [id number]  PCP:  Timothy Maple, MD  Cardiologist:  Ida Rogue, MD  Electrophysiologist:  None   Chief Complaint: ***  History of Present Illness:   Timothy [name] is a 85 y.o. male with history of PAF on ***, paroxysmal SVT, moderate aortic valve stenosis, AAA measuring 3.7 cm in 01/2018, diabetes, hypertension, hyperlipidemia, PAD with bilateral popliteal artery aneurysms status post bilateral popliteal bypass in 2014, Parkinson's disease, falls, and presumed vasovagal versus orthostatic presyncope/syncope who presents for ***.  Prior admission in 2013 for possible TIA with carotid artery ultrasound at that time showing no hemodynamically significant stenosis bilaterally with antegrade flow in the bilateral vertebral arteries.  Echo at that time showed an EF of greater than 64%, diastolic dysfunction, normal RV systolic function, normal RV systolic pressure, mild aortic insufficiency.  Lexiscan Myoview from 01/2012 showed no significant ischemia with an EF of 63%.  Overall, this was a low risk scan.  Patient was diagnosed with paroxysmal SVT as well as A. fib in the [facility] ED in 02/2015.  Follow-up outpatient cardiac monitoring showed normal sinus rhythm with A. fib and frequent PACs with rare PVCs.  In this setting, the patient was started on Eliquis and flecainide.  Most recent aortic ultrasound from 01/2018 showed a maximal diameter of the abdominal aorta being 3.7 cm with recommended follow-up by ultrasound in 3 years.  Patient was admitted to the hospital in 01/2018 with orthostatic dizziness with heart rate noted to be in the 68E bpm and systolic blood pressures in the low 100s bpm.  Patient never suffered LOC.  There is no documentation of arrhythmia per prior note.  Echo at that time showed an EF of 60 to 65%, mild concentric LVH, no regional wall motion abnormalities, grade 1 diastolic dysfunction,  moderate aortic stenosis with mild aortic insufficiency, normal RV systolic function, PASP 38 mmHg.  It was recommended the patient discontinue digoxin at that time.  He was seen in the ED in 04/2018 for an episode of lightheadedness versus syncope with reported BP in the field noted to be 70s over 50s with BP in the ED noted to be 122/65.  With work-up including EKG, chest x-ray, and head CT being unrevealing.  Troponin negative x2.  It was thought the patient may have been mildly dehydrated leading to some degree of hypotension playing a role in his symptoms versus autonomic dysfunction in the setting of the patient's known Parkinson's.  Following this, the patient was admitted to the hospital in early 05/2018 with Campylobacter gastroenteritis.  Most recent lower extremity arterial ultrasound showed normal ABIs bilaterally with patent bypass grafts bilaterally.  More recently, the patient was recently seen in the ED on 02/17/2019 with a near syncope while sitting on the toilet and straining to have a BM.  There was no syncopal episode.  EKG showed sinus rhythm with supraventricular bigeminy, 85 bpm, bifascicular block with left anti-fascicular block and right bundle branch block.  Labs showed high-sensitivity troponin of 9 with a delta of 8, potassium 3.9, glucose 261, serum creatinine 1.36, albumin 3.8, AST/ALT normal, WBC 8.0, Hgb 11.6, PLT 182.  Symptoms were likely vasovagal in etiology in the setting of straining to have a BM.  ***   Labs: 02/2019 - high-sensitivity troponin negative x2, potassium 3.9, glucose 261, serum creatinine 1.36, AST/ALT normal, albumin 3.8, WBC 8.0, Hgb 11.6, PLT 182 12/2018 -  A1c 7.7 05/2018 - digoxin less than 0.2 12/2017 - total cholesterol 116, glyceride 317, HDL 32, LDL 21, TSH normal  Past Medical History:  Diagnosis Date   Back pain    CHF (congestive heart failure) (HCC)    Esophageal reflux    Osteitis deformans without mention of bone tumor    Paget's  Disease   Senile osteoporosis    Skin cancer    TIA (transient ischemic attack)     Past Surgical History:  Procedure Laterality Date   APPENDECTOMY     BACK SURGERY     CATARACT EXTRACTION     COLONOSCOPY  2006,2009   ESOPHAGEAL DILATION     GALLBLADDER SURGERY     HERNIA REPAIR     RETINAL DETACHMENT SURGERY     TONSILLECTOMY      Current Medications: No outpatient medications have been marked as taking for the 02/24/19 encounter (Appointment) with Rise Mu, PA-C.    Allergies:   Patient has no known allergies.   Social History   Socioeconomic History   Marital status: Widowed    Spouse name: Not on file   Number of children: 5   Years of education: Not on file   Highest education level: Not on file  Occupational History   Not on file  Social Needs   Financial resource strain: Not on file   Food insecurity    Worry: Not on file    Inability: Not on file   Transportation needs    Medical: Not on file    Non-medical: Not on file  Tobacco Use   Smoking status: Never Smoker   Smokeless tobacco: Never Used  Substance and Sexual Activity   Alcohol use: No   Drug use: No   Sexual activity: Never  Lifestyle   Physical activity    Days per week: Not on file    Minutes per session: Not on file   Stress: Not on file  Relationships   Social connections    Talks on phone: Not on file    Gets together: Not on file    Attends religious service: Not on file    Active member of club or organization: Not on file    Attends meetings of clubs or organizations: Not on file    Relationship status: Not on file  Other Topics Concern   Not on file  Social History Narrative   Not on file     Family History:  The patient's family history includes Diabetes in an other family member; Heart disease in his brother and mother; Stroke in his brother.  ROS:   ROS   EKGs/Labs/Other Studies Reviewed:    Studies reviewed were summarized  above. The additional studies were reviewed today:  2D Echo 01/2018: - Left ventricle: The cavity size was normal. There was mild   concentric hypertrophy. Systolic function was normal. The   estimated ejection fraction was in the range of 60% to 65%. Wall   motion was normal; there were no regional wall motion   abnormalities. Doppler parameters are consistent with abnormal   left ventricular relaxation (grade 1 diastolic dysfunction). - Aortic valve: There was moderate stenosis. There was mild   regurgitation. Peak velocity (S): 345 cm/s. Mean gradient (S): 28   mm Hg. Valve area (VTI): 1.04 cm^2. - Left atrium: The atrium was normal in size. - Right ventricle: Systolic function was normal. - Pulmonary arteries: Systolic pressure was mildly elevated. PA   peak pressure:  38 mm Hg (S).   EKG:  EKG is ordered today.  The EKG ordered today demonstrates ***  Recent Labs: 02/17/2019: ALT 11; BUN 18; Creatinine, Ser 1.36; Hemoglobin 11.6; Platelets 182; Potassium 3.9; Sodium 138  Recent Lipid Panel    Component Value Date/Time   CHOL 116 12/10/2017 1659   CHOL 127 08/19/2017 1609   CHOL 109 12/25/2011 0400   TRIG 317 (H) 12/10/2017 1659   TRIG 401 (H) 08/19/2017 1609   TRIG 195 12/25/2011 0400   HDL 32 (L) 12/10/2017 1659   HDL 29 (L) 12/25/2011 0400   CHOLHDL 3.6 12/10/2017 1659   VLDL 39 12/25/2011 0400   LDLCALC 21 12/10/2017 1659   LDLCALC 41 12/25/2011 0400    PHYSICAL EXAM:    VS:  There were no vitals taken for this visit.  BMI: There is no height or weight on file to calculate BMI.  Physical Exam  Wt Readings from Last 3 Encounters:  02/17/19 167 lb (75.8 kg)  05/15/18 164 lb 3.9 oz (74.5 kg)  05/04/18 235 lb (106.6 kg)     ASSESSMENT & PLAN:   1. ***  Disposition: F/u with Dr. Rockey Situ or an APP in ***.   Medication Adjustments/Labs and Tests Ordered: Current medicines are reviewed at length with the patient today.  Concerns regarding medicines are outlined  above. Medication changes, Labs and Tests ordered today are summarized above and listed in the Patient Instructions accessible in Encounters.   Signed, Christell Faith, PA-C 02/21/2019 11:27 AM     [facility] [address] [place] [address] [place], [place] [address] [phone]

## 2019-02-22 ENCOUNTER — Telehealth: Payer: Self-pay | Admitting: Family Medicine

## 2019-02-22 NOTE — Telephone Encounter (Signed)
Copied from [place] [id number]. Topic: General - Other >> Feb 17, 2019  2:55 PM Celene Kras A wrote: Reason for CRM: Pts daughter  called stating she is needing a call back to update PCP about pts health. Please advise.
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
## 2019-02-24 ENCOUNTER — Ambulatory Visit: Payer: Medicare Other | Admitting: Physician Assistant

## 2019-02-24 DIAGNOSIS — M7742 Metatarsalgia, left foot: Secondary | ICD-10-CM | POA: Diagnosis not present

## 2019-02-24 DIAGNOSIS — D2372 Other benign neoplasm of skin of left lower limb, including hip: Secondary | ICD-10-CM | POA: Diagnosis not present

## 2019-02-24 DIAGNOSIS — M79672 Pain in left foot: Secondary | ICD-10-CM | POA: Diagnosis not present

## 2019-02-24 DIAGNOSIS — M79671 Pain in right foot: Secondary | ICD-10-CM | POA: Diagnosis not present

## 2019-02-26 NOTE — Progress Notes (Deleted)
No show

## 2019-02-28 ENCOUNTER — Ambulatory Visit: Payer: Medicare Other | Admitting: Cardiovascular Disease

## 2019-04-07 ENCOUNTER — Telehealth: Payer: Self-pay | Admitting: Family Medicine

## 2019-04-07 NOTE — Chronic Care Management (AMB) (Signed)
°  Chronic Care Management   Outreach Note  04/07/2019 Name: Timothy [name] MRN: [id number] DOB: [birth year]  Referred by: Guadalupe Maple, MD Reason for referral : Chronic Care Management (Second CCM outreach was unsuccessful. )   A second unsuccessful telephone outreach was attempted today. The patient was referred to the case management team for assistance with care management and care coordination.   Follow Up Plan: A HIPPA compliant phone message was left for the patient providing contact information and requesting a return call.  The care management team will reach out to the patient again over the next 7 days.  If patient returns call to provider office, please advise to call [place] [facility] [place]  [email or web address]   ??[phone]

## 2019-04-12 NOTE — Chronic Care Management (AMB) (Signed)
°  Chronic Care Management   Outreach Note  04/12/2019 Name: Timothy [name] MRN: [id number] DOB: 1928-08-16  Referred by: Guadalupe Maple, MD Reason for referral : Chronic Care Management (Second CCM outreach was unsuccessful. ) and Chronic Care Management (Third CCM outreach was unsuccessful  )   Third unsuccessful telephone outreach was attempted today. The patient was referred to the case management team for assistance with care management and care coordination. The patient's primary care provider has been notified of our unsuccessful attempts to make or maintain contact with the patient. The care management team is pleased to engage with this patient at any time in the future should he/she be interested in assistance from the care management team.   Follow Up Plan: The care management team is available to follow up with the patient after provider conversation with the patient regarding recommendation for care management engagement and subsequent re-referral to the care management team.   [place]  [email or web address]   ??[phone]
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
## 2019-04-25 ENCOUNTER — Ambulatory Visit (INDEPENDENT_AMBULATORY_CARE_PROVIDER_SITE_OTHER): Payer: Medicare Other | Admitting: Cardiovascular Disease

## 2019-04-25 ENCOUNTER — Other Ambulatory Visit: Payer: Self-pay

## 2019-04-25 ENCOUNTER — Encounter: Payer: Self-pay | Admitting: Cardiovascular Disease

## 2019-04-25 VITALS — BP 130/70 | HR 79 | Ht 70.0 in | Wt 177.2 lb

## 2019-04-25 DIAGNOSIS — E782 Mixed hyperlipidemia: Secondary | ICD-10-CM

## 2019-04-25 DIAGNOSIS — I350 Nonrheumatic aortic (valve) stenosis: Secondary | ICD-10-CM

## 2019-04-25 DIAGNOSIS — I10 Essential (primary) hypertension: Secondary | ICD-10-CM

## 2019-04-25 DIAGNOSIS — I471 Supraventricular tachycardia, unspecified: Secondary | ICD-10-CM

## 2019-04-25 DIAGNOSIS — I359 Nonrheumatic aortic valve disorder, unspecified: Secondary | ICD-10-CM | POA: Diagnosis not present

## 2019-04-25 DIAGNOSIS — I480 Paroxysmal atrial fibrillation: Secondary | ICD-10-CM

## 2019-04-25 DIAGNOSIS — I714 Abdominal aortic aneurysm, without rupture, unspecified: Secondary | ICD-10-CM

## 2019-04-25 DIAGNOSIS — E1159 Type 2 diabetes mellitus with other circulatory complications: Secondary | ICD-10-CM | POA: Diagnosis not present

## 2019-04-25 NOTE — Patient Instructions (Addendum)
Medication Instructions:  No changes  If you need a refill on your cardiac medications before your next appointment, please call your pharmacy.    Lab work: No new labs needed   If you have labs (blood work) drawn today and your tests are completely normal, you will receive your results only by: Marland Kitchen MyChart Message (if you have MyChart) OR . A paper copy in the mail If you have any lab test that is abnormal or we need to change your treatment, we will call you to review the results.   Testing/Procedures: - Your physician has requested that you have an echocardiogram. Echocardiography is a painless test that uses sound waves to create images of your heart. It provides your doctor with information about the size and shape of your heart and how well your heart's chambers and valves are working. This procedure takes approximately one hour. There are no restrictions for this procedure.  Follow-Up: At [facility], you and your health needs are our priority.  As part of our continuing mission to provide you with exceptional heart care, we have created designated Provider Care Teams.  These Care Teams include your primary Cardiologist (physician) and Advanced Practice Providers (APPs -  Physician Assistants and Nurse Practitioners) who all work together to provide you with the care you need, when you need it.  . You will need a follow up appointment in 12 months .   Please call our office 2 months in advance to schedule this appointment.    . Providers on your designated Care Team:   . Murray Hodgkins, NP . Christell Faith, PA-C . Marrianne Mood, PA-C  Any Other Special Instructions Will Be Listed Below (If Applicable).  For educational health videos Log in to : [email or web address] Or : [email or web address], password : [email or web address]

## 2019-04-28 ENCOUNTER — Telehealth: Payer: Self-pay | Admitting: Physician Assistant

## 2019-04-28 ENCOUNTER — Telehealth: Payer: Self-pay | Admitting: Cardiovascular Disease

## 2019-04-28 NOTE — Telephone Encounter (Signed)
Call to patient, spoke with [place], daughter.   Pt having DOE since seeing Dr. Rockey Situ 10/19. Has numbness and tingling in BLE, worse L.   Denies swelling, cough, difficulty sleeping or cxp.  Per daughter nail beds are white and feet are cold as ice. Skin normal color.   Vitals today wt 173, BP 146/82, HR 74, o2 97%, BG 135.  Pt compliant with medications.   Made next available appt with Christell Faith, PA on Monday.   I requested they call for urgent medical care if o2 sats drop, cxp occurs or worsening s/sx.   He is d/t have echo next month.

## 2019-04-28 NOTE — Telephone Encounter (Signed)
Pt c/o Shortness Of Breath: STAT if SOB developed within the last 24 hours or pt is noticeably SOB on the phone  1. Are you currently SOB (can you hear that pt is SOB on the phone)? No   2. How long have you been experiencing SOB? A couple days mild   3. Are you SOB when sitting or when up moving around? Both increases with exertion   4. Are you currently experiencing any other symptoms? Circulation - numbness and tingling BLE mostly left
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
## 2019-04-28 NOTE — Telephone Encounter (Signed)
Will defer to provider that saw patient 3 days prior. It appears things were stable at last visit given he was advised to follow up in 12 months. I have never seen this patient. Sounds like he may need more urgent evaluation based on symptoms. Recommend ED.

## 2019-04-29 ENCOUNTER — Telehealth: Payer: Self-pay | Admitting: Physician Assistant

## 2019-04-29 NOTE — Telephone Encounter (Signed)
Pt daughter is calling back.

## 2019-04-29 NOTE — Telephone Encounter (Signed)
Spoke with [facility] for second time on the evening of 10/22.  He indicated EMS came out and assessed the patient who was felt to be stable and did not need emergent evaluation at that time.  The officer indicated he would investigate the previously noted issues further and follow-up with the family.  In this setting, we will defer any further issues to lawn for [place].  I did reach out to our clinical staff this morning with regards to the phone call in which we were speaking with a family member.  This was a phone call came into our office with the caller identifying themselves as [place].

## 2019-04-29 NOTE — Telephone Encounter (Signed)
10:21 AM Call to daughter, Karmen Bongo per [facility] for scheduling changes. Per Christell Faith, PA, please reschedule pt with primary cardiologist.   Spoke with daughter, [name]. She reported that she was not currently in communication with her father d/t social issues with new wife and a caregiver. She then went into detail about situation of past adult protective services complaints and issues with banking/financial concerns. Per daughter, caregiver and "new wife" have alienated family from caring for and speaking to patient.   Daughter reports that she still wants to be involved in her dads care but cannot at this time d/t family strain. She asks about resources to help and I told her I honestly did not know. I suggested her call PCP or lawyer to further discuss since she believes he and wife are incapable for making medical decisions.   I told daughter, at this point the primary goal is to get the patient scheduled to see Dr. Rockey Situ.   I made call to patient and spoke with him. Pt was pleasant and appropriate. I explained that I would need to switch him to see Dr. Rockey Situ, he was agreeable and stated that sx had improved and he was not currently having pain. Pt was agreeable to next available appt with Dr. Rockey Situ 05/10/19 @ 3:30 PM. I told him that I would work on moving u/s prior to [place]. Pt verbalized understanding.   1:30 PM returned call to daughter after missed call. She requested update on how her dad was doing. I reported information to her per DPR. All questions answered and POC reviewed.    Routing call to Dr. Rockey Situ to make sure there is no further advice.

## 2019-04-29 NOTE — Telephone Encounter (Signed)
Authorities are involved in the patient's care/case.  Defer recommendations to primary cardiologist and authorities.

## 2019-04-29 NOTE — Telephone Encounter (Signed)
Per clinical patient needs LEA and echo prior to 11/3 ov.   Scheduled for 11/2 920 at [facility] for echo     1100 at [place] for [place] patient and he is agreeable to go to [facility] for testing .  Mailed reminder with address and telephone for both offices.

## 2019-05-02 ENCOUNTER — Ambulatory Visit: Payer: Medicare Other | Admitting: Physician Assistant

## 2019-05-05 ENCOUNTER — Telehealth: Payer: Self-pay | Admitting: Cardiovascular Disease

## 2019-05-05 NOTE — Telephone Encounter (Signed)
I called and spoke with the patient's daughter, [name] (ok per DPR). She states when she had called earlier, she was asking for a call from Dr. Rockey Situ directly  I inquired from her if there was anything in particular I could ask Dr. Rockey Situ for her and she states she was just needing to have a general conversation with him about her father.   I have advised her that Dr. Rockey Situ is covering the hospital today and they have quite a few consults to get through so I am uncertain when he would be able to call her back, but I would forward the request to him.  Glea voices understanding.   In reviewing the patient's chart, there are several notes from [place], [place] concerning the patient's daughter's concerns about there father. Ryan had to call for a wellness check on Mr. [place]iland last week.   To Dr. Rockey Situ to review and call the patient's daughter.

## 2019-05-05 NOTE — Telephone Encounter (Signed)
Please call daughter , she would like to find out more information what is going on with patient.

## 2019-05-09 ENCOUNTER — Ambulatory Visit (HOSPITAL_COMMUNITY)
Admission: RE | Admit: 2019-05-09 | Payer: Medicare Other | Source: Ambulatory Visit | Attending: Cardiovascular Disease | Admitting: Cardiovascular Disease

## 2019-05-09 ENCOUNTER — Other Ambulatory Visit (HOSPITAL_COMMUNITY): Payer: Medicare Other

## 2019-05-10 ENCOUNTER — Ambulatory Visit: Payer: Medicare Other | Admitting: Cardiovascular Disease

## 2019-05-10 ENCOUNTER — Telehealth: Payer: Self-pay | Admitting: Cardiovascular Disease

## 2019-05-10 NOTE — Telephone Encounter (Signed)
Left voicemail message that we missed him today for his appointment and to please call us back if they need assistance with rescheduling this testing and appointment.

## 2019-05-10 NOTE — Telephone Encounter (Signed)
Patient daughter Timothy [name] returning call  Made her aware we were unable to reach patient in order to reschedule Timothy [name] would like to be notified at [phone] if patient does call and reschedule appointment

## 2019-05-10 NOTE — Telephone Encounter (Signed)
Attempted to call patients daughter and no answer with voicemail being full.

## 2019-05-10 NOTE — Telephone Encounter (Signed)
Called and spoke with Glea per release form to let her know that patient did not show for appointment today. She reports that she has some concerns about his swelling and appointments. She states that if he knows we called her to discuss his care he may request that it be removed. She requested that I please try to call him and then call her with update.

## 2019-05-12 NOTE — Telephone Encounter (Signed)
This encounter was created in error - please disregard.

## 2019-05-16 ENCOUNTER — Ambulatory Visit (HOSPITAL_COMMUNITY): Payer: Medicare Other | Attending: Internal Medicine

## 2019-05-16 ENCOUNTER — Other Ambulatory Visit: Payer: Self-pay

## 2019-05-16 DIAGNOSIS — I359 Nonrheumatic aortic valve disorder, unspecified: Secondary | ICD-10-CM | POA: Insufficient documentation

## 2019-05-24 ENCOUNTER — Telehealth: Payer: Self-pay | Admitting: *Deleted

## 2019-05-24 NOTE — Telephone Encounter (Signed)
Reviewed results with patients daughter per release form and she then inquired if he had rescheduled his lower extremity testing. Advised that I can send over to scheduling to have them reach out to get that done. She requested that they try all numbers to include [phone]. She was appreciative for the call with no further questions at this time.

## 2019-05-24 NOTE — Telephone Encounter (Signed)
Left voicemail message to call back  

## 2019-05-24 NOTE — Telephone Encounter (Signed)
-----   Message from [name], MD sent at 05/20/2019  6:20 PM EST ----- Echocardiogram Normal LV function Moderate aortic valve stenosis No significant change

## 2019-05-25 NOTE — Telephone Encounter (Signed)
lmov to schedule LEA

## 2019-05-26 ENCOUNTER — Other Ambulatory Visit: Payer: Self-pay | Admitting: Nurse Practitioner

## 2019-05-26 ENCOUNTER — Other Ambulatory Visit: Payer: Self-pay

## 2019-05-26 ENCOUNTER — Ambulatory Visit (INDEPENDENT_AMBULATORY_CARE_PROVIDER_SITE_OTHER): Payer: Medicare Other

## 2019-05-26 DIAGNOSIS — M79661 Pain in right lower leg: Secondary | ICD-10-CM

## 2019-05-26 DIAGNOSIS — M7989 Other specified soft tissue disorders: Secondary | ICD-10-CM | POA: Diagnosis not present

## 2019-05-26 DIAGNOSIS — I739 Peripheral vascular disease, unspecified: Secondary | ICD-10-CM

## 2019-05-26 NOTE — Telephone Encounter (Signed)
Patient called back in and they have him scheduled to come in today for study to be done.

## 2019-05-26 NOTE — Telephone Encounter (Signed)
[phone] Cindy Caregiver - obtained one time verbal consent from patient to discuss care.   Patient caregiver cindy - not on dpr calling to schedule asap appt for LLE pain .  She says veins are poking out and leg is purple in color.    Attempted to schedule sooner LEA or ov but unable to do so due to booking.  Added to waitlist .

## 2019-05-26 NOTE — Telephone Encounter (Signed)
Left voicemail message to call back  

## 2019-05-28 ENCOUNTER — Other Ambulatory Visit: Payer: Self-pay | Admitting: Cardiovascular Disease

## 2019-05-30 ENCOUNTER — Other Ambulatory Visit: Payer: Self-pay | Admitting: *Deleted

## 2019-05-30 DIAGNOSIS — I70201 Unspecified atherosclerosis of native arteries of extremities, right leg: Secondary | ICD-10-CM

## 2019-05-30 NOTE — Telephone Encounter (Signed)
Please advise if ok to refill Flecainide 50 mg tablet BID. Medication isn't on pt's current med list.  Pt has verified medication and is taking Flecainide 50 mg tablet bid.   Pt would also like to discuss lower extremity ultrasound results.

## 2019-05-31 ENCOUNTER — Ambulatory Visit: Payer: Medicare Other | Admitting: Nurse Practitioner

## 2019-05-31 ENCOUNTER — Encounter: Payer: Self-pay | Admitting: Nurse Practitioner

## 2019-05-31 DIAGNOSIS — I714 Abdominal aortic aneurysm, without rupture, unspecified: Secondary | ICD-10-CM | POA: Insufficient documentation

## 2019-05-31 DIAGNOSIS — E119 Type 2 diabetes mellitus without complications: Secondary | ICD-10-CM | POA: Insufficient documentation

## 2019-05-31 DIAGNOSIS — I739 Peripheral vascular disease, unspecified: Secondary | ICD-10-CM | POA: Insufficient documentation

## 2019-05-31 DIAGNOSIS — I350 Nonrheumatic aortic (valve) stenosis: Secondary | ICD-10-CM | POA: Insufficient documentation

## 2019-05-31 DIAGNOSIS — E782 Mixed hyperlipidemia: Secondary | ICD-10-CM | POA: Insufficient documentation

## 2019-05-31 DIAGNOSIS — I5030 Unspecified diastolic (congestive) heart failure: Secondary | ICD-10-CM | POA: Insufficient documentation

## 2019-05-31 DIAGNOSIS — E1169 Type 2 diabetes mellitus with other specified complication: Secondary | ICD-10-CM | POA: Insufficient documentation

## 2019-05-31 NOTE — Progress Notes (Deleted)
Office Visit    Patient Name: Timothy [name] Date of Encounter: 05/31/2019  Primary Care Provider:  Guadalupe Maple, MD Primary Cardiologist:  Ida Rogue, MD  Chief Complaint    85 year old male with a history of hypertension, hyperlipidemia, peripheral arterial disease status post bilateral lower extremity bypasses, HFpEF, infrarenal abdominal aortic aneurysm, moderate aortic stenosis, Parkinson's, TIA, diabetes, and paroxysmal atrial fibrillation, who presents for follow-up of lower extremity pain.  Past Medical History    Past Medical History:  Diagnosis Date  . (HFpEF) heart failure with preserved ejection fraction ([place])    a. 05/2019 Echo: EF 65-70%, mod LVH. Gr2 DD. Mod AS.  Marland Kitchen AAA (abdominal aortic aneurysm) ([place])    a. 2014 Abd U/S: 3.5cm AAA; b. 10/20189 Abd U/S: 3.3cm AAA; c. 01/2018 CT Abd/Pelvis: 2.7cm infrarenal AAA - rec f/u U/S in 5 yrs.  . Back pain   . Esophageal reflux   . Essential hypertension   . History of stress test    a. 01/2012 MV: EF 63%, no rwma. No ischemia/infarct. Low risk.  . Mixed hyperlipidemia   . Moderate aortic stenosis    a. 05/2019 Echo: Mod AS. Mean grad 29.5 mmHg. Peak grad 55.7 mmHg. AVA 1.28 cm^2 (VTI). No significant change from 2019 echo.  . 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 PAD (peripheral artery disease) ([place])    a. 12/2012 and 05/2013 s/p bilat LE bypass @ [facility] in setting of bilat popliteal aneurysms; b. 05/2019 LE Duplex: R PT 50-74%, Nl L Leg.  . Parkinson's disease ([place])   . Persistent atrial fibrillation ([place])    a. 02/2015 Event monitor: long periods of Afib--prev on flecainide; b. CHA2DS2VASc = 8-->Eliquis 5 bid.  . Senile osteoporosis   . Skin cancer   . Swelling of right lower extremity    a. 05/2019 LE U/S: No DVT. 50-64% R PT artery stenosis.  Marland Kitchen TIA (transient ischemic attack)   . Type II diabetes mellitus ([place])    Past Surgical History:  Procedure Laterality Date  .  APPENDECTOMY    . BACK SURGERY    . CATARACT EXTRACTION    . COLONOSCOPY  [id number]  . ESOPHAGEAL DILATION    . GALLBLADDER SURGERY    . HERNIA REPAIR    . RETINAL DETACHMENT SURGERY    . TONSILLECTOMY      Allergies  No Known Allergies  History of Present Illness    85 year old male with above complex past medical history including hypertension, hyperlipidemia, peripheral arterial disease status post bilateral lower extremity bypasses, HFpEF, infrarenal abdominal aortic aneurysm (2.7 cm by CT July 2019), moderate aortic stenosis, Parkinson's, TIA, diabetes, and paroxysmal atrial fibrillation.  He has also previously undergone stress testing with a nonischemic Myoview in July 2013 and stress echo at [facility] in 2016, which was normal.  In 2016, he wore an event monitor in the setting of palpitations and was noted to have prolonged periods of atrial fibrillation.  He has been chronically anticoagulated and was also on flecainide at one point.  In August 2020, he was hospitalized with near syncope after straining to have a bowel movement.  Cardiac work-up was unremarkable and he was treated with IV fluids in the emergency room.  He was last seen in cardiology clinic on October 19, at which time he was maintaining sinus rhythm and doing relatively well.  He did note lower extremity weakness in the setting of Parkinson's and was using  a walker.  He was felt to be high risk for falls.  Following that visit, our office was contacted due to concerns regarding dyspnea and left greater than right lower extremity numbness and tingling.  Patient was unable to be reached and his daughter voiced concerns that his male caregiver may not be taking the best of care of him.  A police officer was sent out to the house and the patient was found to be stable.  Echocardiogram performed earlier this month shows normal LV function with stable, moderate aortic stenosis.  No significant change since 2019.  On November 17,  we were contacted out of concern for left lower extremity swelling and discoloration.  He was scheduled for lower extremity arterial duplex.  On arrival, he was noted to have trace right lower extremity edema.  Examination was performed and showed 50 to 74% right posterior tibial artery stenosis.  Right lower extremity venous duplex was negative for DVT.  The left lower extremity was normal.  Home Medications    Prior to Admission medications   Medication Sig Start Date End Date Taking? Authorizing Provider  acetaminophen (TYLENOL) 500 MG tablet Take 1,000 mg by mouth every 8 (eight) hours as needed for mild pain or moderate pain.     [provider]  apixaban (ELIQUIS) 5 MG TABS tablet Take 1 tablet (5 mg total) by mouth 2 (two) times daily. 12/16/18   Guadalupe Maple, MD  Calcium Citrate-Vitamin D (CALCIUM + D PO) Take 1 tablet by mouth daily.     [provider]  carbidopa-levodopa (SINEMET) 25-100 MG tablet Take 1 tablet by mouth 4 (four) times daily.     [provider]  cyanocobalamin (,VITAMIN B-12,) 1000 MCG/ML injection INJECT 1 ML INTO THE MUSCLE EVERY 30 DAYS 12/16/18   Guadalupe Maple, MD  dapagliflozin propanediol (FARXIGA) 10 MG TABS tablet Take 10 mg by mouth daily. 12/16/18   Guadalupe Maple, MD  glipiZIDE-metformin (METAGLIP) 5-500 MG tablet Take 1 tablet by mouth 2 (two) times daily. 12/16/18   Guadalupe Maple, MD  Multiple Vitamins-Minerals (CEROVITE SENIOR PO) Take 1 tablet by mouth daily.    [provider]  pantoprazole (PROTONIX) 40 MG tablet Take 1 tablet (40 mg total) by mouth 2 (two) times daily. 12/16/18   Guadalupe Maple, MD  Probiotic Product (PHILLIPS COLON HEALTH PO) Take 1 capsule by mouth daily.     [provider]  rosuvastatin (CRESTOR) 10 MG tablet Take 1 tablet (10 mg total) by mouth daily. 12/16/18   Guadalupe Maple, MD  sertraline (ZOLOFT) 50 MG tablet Take 1 tablet (50 mg total) by mouth at bedtime. 12/16/18    Guadalupe Maple, MD  sitaGLIPtin (JANUVIA) 100 MG tablet Take 1 tablet (100 mg total) by mouth daily. 12/16/18   Guadalupe Maple, MD    Review of Systems    ***.  All other systems reviewed and are otherwise negative except as noted above.  Physical Exam    VS:  There were no vitals taken for this visit. , BMI There is no height or weight on file to calculate BMI. GEN: Well nourished, well developed, in no acute distress. HEENT: normal. Neck: Supple, no JVD, carotid bruits, or masses. Cardiac: RRR, no murmurs, rubs, or gallops. No clubbing, cyanosis, edema.  Radials/DP/PT 2+ and equal bilaterally.  Respiratory:  Respirations regular and unlabored, clear to auscultation bilaterally. GI: Soft, nontender, nondistended, BS + x 4. MS: no deformity or atrophy.  Skin: warm and dry, no rash. Neuro:  Strength and sensation are intact. Psych: Normal affect.  Accessory Clinical Findings    ECG personally reviewed by me today - *** - no acute changes.  Lab Results  Component Value Date   WBC 8.0 02/17/2019   HGB 11.6 (L) 02/17/2019   HCT 37.0 (L) 02/17/2019   MCV 90.0 02/17/2019   PLT 182 02/17/2019   Lab Results  Component Value Date   CREATININE 1.36 (H) 02/17/2019   BUN 18 02/17/2019   NA 138 02/17/2019   K 3.9 02/17/2019   CL 104 02/17/2019   CO2 23 02/17/2019   Lab Results  Component Value Date   ALT 11 02/17/2019   AST 27 02/17/2019   ALKPHOS 52 02/17/2019   BILITOT 0.6 02/17/2019   Lab Results  Component Value Date   CHOL 116 12/10/2017   HDL 32 (L) 12/10/2017   LDLCALC 21 12/10/2017   TRIG 317 (H) 12/10/2017   CHOLHDL 3.6 12/10/2017     Assessment & Plan    1.  ***   Murray Hodgkins, NP 05/31/2019, 9:59 AM

## 2019-06-01 ENCOUNTER — Other Ambulatory Visit: Payer: Medicare Other

## 2019-06-10 ENCOUNTER — Encounter: Payer: Self-pay | Admitting: Nurse Practitioner

## 2019-06-10 ENCOUNTER — Ambulatory Visit (INDEPENDENT_AMBULATORY_CARE_PROVIDER_SITE_OTHER): Payer: Medicare Other | Admitting: Physician Assistant

## 2019-06-10 ENCOUNTER — Other Ambulatory Visit: Payer: Self-pay

## 2019-06-10 VITALS — BP 124/76 | HR 93 | Ht 69.0 in | Wt 171.2 lb

## 2019-06-10 DIAGNOSIS — I714 Abdominal aortic aneurysm, without rupture, unspecified: Secondary | ICD-10-CM

## 2019-06-10 DIAGNOSIS — I480 Paroxysmal atrial fibrillation: Secondary | ICD-10-CM

## 2019-06-10 DIAGNOSIS — I350 Nonrheumatic aortic (valve) stenosis: Secondary | ICD-10-CM | POA: Diagnosis not present

## 2019-06-10 DIAGNOSIS — I5032 Chronic diastolic (congestive) heart failure: Secondary | ICD-10-CM | POA: Diagnosis not present

## 2019-06-10 DIAGNOSIS — G20 Parkinson's disease: Secondary | ICD-10-CM

## 2019-06-10 DIAGNOSIS — Z9181 History of falling: Secondary | ICD-10-CM

## 2019-06-10 DIAGNOSIS — I70201 Unspecified atherosclerosis of native arteries of extremities, right leg: Secondary | ICD-10-CM

## 2019-06-10 DIAGNOSIS — E782 Mixed hyperlipidemia: Secondary | ICD-10-CM

## 2019-06-10 DIAGNOSIS — I739 Peripheral vascular disease, unspecified: Secondary | ICD-10-CM | POA: Diagnosis not present

## 2019-06-10 DIAGNOSIS — E1159 Type 2 diabetes mellitus with other circulatory complications: Secondary | ICD-10-CM

## 2019-06-10 DIAGNOSIS — I10 Essential (primary) hypertension: Secondary | ICD-10-CM | POA: Diagnosis not present

## 2019-06-10 NOTE — Patient Instructions (Addendum)
Please monitor your blood pressure and heart rate at home. Please call us on Tuesday, 06/14/19 with the readings.   Medication Instructions:  Your physician recommends that you continue on your current medications as directed. Please refer to the Current Medication list given to you today.  *If you need a refill on your cardiac medications before your next appointment, please call your pharmacy*  Lab Work: none If you have labs (blood work) drawn today and your tests are completely normal, you will receive your results only by: Marland Kitchen MyChart Message (if you have MyChart) OR . A paper copy in the mail If you have any lab test that is abnormal or we need to change your treatment, we will call you to review the results.  Testing/Procedures: none  Follow-Up: At [facility], you and your health needs are our priority.  As part of our continuing mission to provide you with exceptional heart care, we have created designated Provider Care Teams.  These Care Teams include your primary Cardiologist (physician) and Advanced Practice Providers (APPs -  Physician Assistants and Nurse Practitioners) who all work together to provide you with the care you need, when you need it.  Your next appointment:   3 month(s)  The format for your next appointment:   In Person  Provider:    You may see Ida Rogue, MD or one of the following Advanced Practice Providers on your designated Care Team:    Murray Hodgkins, NP  Christell Faith, PA-C  Marrianne Mood, PA-C   How to Take Your Blood Pressure You can take your blood pressure at home with a machine. You may need to check your blood pressure at home:  To check if you have high blood pressure (hypertension).  To check your blood pressure over time.  To make sure your blood pressure medicine is working. Supplies needed: You will need a blood pressure machine, or monitor. You can buy one at a drugstore or online. When choosing one:  Choose one  with an arm cuff.  Choose one that wraps around your upper arm. Only one finger should fit between your arm and the cuff.  Do not choose one that measures your blood pressure from your wrist or finger. Your doctor can suggest a monitor. How to prepare Avoid these things for 30 minutes before checking your blood pressure:  Drinking caffeine.  Drinking alcohol.  Eating.  Smoking.  Exercising. Five minutes before checking your blood pressure:  Pee.  Sit in a dining chair. Avoid sitting in a soft couch or armchair.  Be quiet. Do not talk. How to take your blood pressure Follow the instructions that came with your machine. If you have a digital blood pressure monitor, these may be the instructions: 1. Sit up straight. 2. Place your feet on the floor. Do not cross your ankles or legs. 3. Rest your left arm at the level of your heart. You may rest it on a table, desk, or chair. 4. Pull up your shirt sleeve. 5. Wrap the blood pressure cuff around the upper part of your left arm. The cuff should be 1 inch (2.5 cm) above your elbow. It is best to wrap the cuff around bare skin. 6. Fit the cuff snugly around your arm. You should be able to place only one finger between the cuff and your arm. 7. Put the cord inside the groove of your elbow. 8. Press the power button. 9. Sit quietly while the cuff fills with air and loses  air. 10. Write down the numbers on the screen. 11. Wait 2-3 minutes and then repeat steps 1-10. What do the numbers mean? Two numbers make up your blood pressure. The first number is called systolic pressure. The second is called diastolic pressure. An example of a blood pressure reading is "120 over 80" (or 120/80). If you are an adult and do not have a medical condition, use this guide to find out if your blood pressure is normal: Normal  First number: below 120.  Second number: below 80. Elevated  First number: 120-129.  Second number: below 80. Hypertension  stage 1  First number: 130-139.  Second number: 80-89. Hypertension stage 2  First number: 140 or above.  Second number: 19 or above. Your blood pressure is above normal even if only the top or bottom number is above normal. Follow these instructions at home:  Check your blood pressure as often as your doctor tells you to.  Take your monitor to your next doctor's appointment. Your doctor will: ? Make sure you are using it correctly. ? Make sure it is working right.  Make sure you understand what your blood pressure numbers should be.  Tell your doctor if your medicines are causing side effects. Contact a doctor if:  Your blood pressure keeps being high. Get help right away if:  Your first blood pressure number is higher than 180.  Your second blood pressure number is higher than 120. This information is not intended to replace advice given to you by your health care provider. Make sure you discuss any questions you have with your health care provider. Document Released: 06/05/2008 Document Revised: 06/05/2017 Document Reviewed: 11/30/2015 Elsevier Patient Education  2020 [employer].
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
## 2019-07-05 DIAGNOSIS — S76011A Strain of muscle, fascia and tendon of right hip, initial encounter: Secondary | ICD-10-CM | POA: Diagnosis not present

## 2019-07-05 DIAGNOSIS — W010XXA Fall on same level from slipping, tripping and stumbling without subsequent striking against object, initial encounter: Secondary | ICD-10-CM | POA: Diagnosis not present

## 2019-07-05 DIAGNOSIS — M25551 Pain in right hip: Secondary | ICD-10-CM | POA: Diagnosis not present

## 2019-07-11 ENCOUNTER — Telehealth: Payer: Self-pay | Admitting: Cardiovascular Disease

## 2019-07-11 NOTE — Telephone Encounter (Signed)
STAT if HR is under 50 or over 120 (normal HR is 60-100 beats per minute)  1) What is your heart rate? 93  2) Do you have a log of your heart rate readings (document readings)?   3) Do you have any other symptoms? No, says his BP is good.  Very difficult to understand on the phone, wanted an appt with Dr Rockey Situ this afternoon, no appts available offered 1/6 with Elenor Quinones but declined.  Please call to discuss.

## 2019-07-11 NOTE — Telephone Encounter (Signed)
Returned call to patient. He reports elevated HR intermittently since early dec. This morning after getting up was in the 90s but now in the 70s. He denies chest pain, SOB or swelling. Does not have weight but does not feel it is elevated. Pt reports that he has been complaint with Eliquis.   I told him it would be helpful to have more VS values. He agrees to bring them by the medical mall tomorrow so we can evaluate them thoroughly and route to Dr. Rockey Situ.

## 2019-07-12 DIAGNOSIS — Z23 Encounter for immunization: Secondary | ICD-10-CM | POA: Diagnosis not present

## 2019-07-12 NOTE — Telephone Encounter (Signed)
Call to pt to see if he had a chance to drop off vs at the medical mall. No word at this time. Left message on machine.

## 2019-07-13 NOTE — Telephone Encounter (Signed)
Record received from patient with vital signs.   12/27 130/80, HR 69 12/28 BP __, HR 64 12/29 BP 122/78, HR 65 12/30 BP135/74, HR 68 12/31 BP 141/76, HR 64 1/2 BP 139/73, HR 62 1/3 BP 131/70, HR 71 1/4 BP 128/78, HR 92 1/6 BP 123/73, HR 76  Call to patient after review of vital signs. Original call was made to discuss elevated HR. Per list I have been given only 1 episode of HR in 90s, otherwise at baseline. Pt did not answer but I left message for patient to call back to discuss.

## 2019-07-13 NOTE — Telephone Encounter (Signed)
Patient dropped off log of vital signs at front desk. It has been placed at your work station

## 2019-07-14 NOTE — Telephone Encounter (Signed)
Left voicemail message to call back  

## 2019-07-14 NOTE — Telephone Encounter (Signed)
Spoke with patient and reviewed his readings looked good and confirmed his scheduled appointment. He was glad to hear they were normal and verbalized understanding with no further questions at this time.

## 2019-07-21 DIAGNOSIS — M7742 Metatarsalgia, left foot: Secondary | ICD-10-CM | POA: Diagnosis not present

## 2019-07-21 DIAGNOSIS — M79672 Pain in left foot: Secondary | ICD-10-CM | POA: Diagnosis not present

## 2019-07-21 DIAGNOSIS — M79671 Pain in right foot: Secondary | ICD-10-CM | POA: Diagnosis not present

## 2019-07-21 DIAGNOSIS — E1151 Type 2 diabetes mellitus with diabetic peripheral angiopathy without gangrene: Secondary | ICD-10-CM | POA: Diagnosis not present

## 2019-07-21 DIAGNOSIS — B351 Tinea unguium: Secondary | ICD-10-CM | POA: Diagnosis not present

## 2019-07-23 ENCOUNTER — Other Ambulatory Visit: Payer: Self-pay | Admitting: Cardiovascular Disease

## 2019-07-23 ENCOUNTER — Encounter: Payer: Self-pay | Admitting: Nurse Practitioner

## 2019-07-25 ENCOUNTER — Other Ambulatory Visit: Payer: Self-pay

## 2019-07-25 MED ORDER — APIXABAN 5 MG PO TABS: 5.0000 mg | ORAL_TABLET | Freq: Two times a day (BID) | ORAL | 5 refills | Status: DC

## 2019-07-25 NOTE — Telephone Encounter (Signed)
Please review for refill for Flecainide, I do not see on last office visit or where it was discontinued.

## 2019-07-25 NOTE — Telephone Encounter (Signed)
Refill request for Eliquis 5mg . Upcoming appointment 07/27/19

## 2019-07-27 ENCOUNTER — Ambulatory Visit: Payer: Medicare Other | Admitting: Nurse Practitioner

## 2019-08-22 DIAGNOSIS — Z86007 Personal history of in-situ neoplasm of skin: Secondary | ICD-10-CM | POA: Diagnosis not present

## 2019-08-22 DIAGNOSIS — L578 Other skin changes due to chronic exposure to nonionizing radiation: Secondary | ICD-10-CM | POA: Diagnosis not present

## 2019-08-22 DIAGNOSIS — C44219 Basal cell carcinoma of skin of left ear and external auricular canal: Secondary | ICD-10-CM | POA: Diagnosis not present

## 2019-08-22 DIAGNOSIS — Z1283 Encounter for screening for malignant neoplasm of skin: Secondary | ICD-10-CM | POA: Diagnosis not present

## 2019-08-22 DIAGNOSIS — L928 Other granulomatous disorders of the skin and subcutaneous tissue: Secondary | ICD-10-CM | POA: Diagnosis not present

## 2019-08-22 DIAGNOSIS — L570 Actinic keratosis: Secondary | ICD-10-CM | POA: Diagnosis not present

## 2019-08-22 DIAGNOSIS — L280 Lichen simplex chronicus: Secondary | ICD-10-CM | POA: Diagnosis not present

## 2019-08-22 DIAGNOSIS — L219 Seborrheic dermatitis, unspecified: Secondary | ICD-10-CM | POA: Diagnosis not present

## 2019-08-22 DIAGNOSIS — C4491 Basal cell carcinoma of skin, unspecified: Secondary | ICD-10-CM

## 2019-08-22 HISTORY — DX: Basal cell carcinoma of skin, unspecified: C44.91

## 2019-08-26 ENCOUNTER — Other Ambulatory Visit: Payer: Self-pay | Admitting: Cardiovascular Disease

## 2019-08-26 NOTE — Telephone Encounter (Signed)
Lmovm to verify if pt is taking Flecainide 50 mg bid. Medication not on pt's medication list.

## 2019-08-30 NOTE — Telephone Encounter (Signed)
Lmovm no answer. 

## 2019-09-12 ENCOUNTER — Ambulatory Visit: Payer: Medicare Other | Admitting: Cardiovascular Disease

## 2019-09-14 ENCOUNTER — Other Ambulatory Visit: Payer: Self-pay

## 2019-09-14 ENCOUNTER — Ambulatory Visit
Admission: EM | Admit: 2019-09-14 | Discharge: 2019-09-14 | Disposition: A | Discharge status: Home / Self Care | Payer: Medicare Other | Attending: Family Medicine | Admitting: Family Medicine

## 2019-09-14 ENCOUNTER — Encounter: Payer: Self-pay | Admitting: Emergency Medicine

## 2019-09-14 DIAGNOSIS — R0982 Postnasal drip: Secondary | ICD-10-CM | POA: Diagnosis not present

## 2019-09-14 DIAGNOSIS — Z9109 Other allergy status, other than to drugs and biological substances: Secondary | ICD-10-CM | POA: Diagnosis not present

## 2019-09-14 DIAGNOSIS — R05 Cough: Secondary | ICD-10-CM

## 2019-09-14 DIAGNOSIS — R059 Cough, unspecified: Secondary | ICD-10-CM

## 2019-09-14 MED ORDER — BENZONATATE 200 MG PO CAPS: 200.0000 mg | ORAL_CAPSULE | Freq: Three times a day (TID) | ORAL | 0 refills | Status: DC | PRN

## 2019-09-14 NOTE — ED Provider Notes (Signed)
[facility]    CSN: [id number] Arrival date & time: 09/14/19  1153      History   Chief Complaint Chief Complaint  Patient presents with  . Headache  . Sore Throat  . Cough    HPI Timothy [name] is a 86 y.o. male.   86 yo male with a c/o scratchy and sore throat, cough, sneezing, nasal congestion. Denies any fevers, chest pains, shortness of breath. States he has allergies and takes Human resources officer for this. No known sick contacts. Patient has had both doses of covid vaccine over 3 weeks ago.    Headache Associated symptoms: cough   Sore Throat Associated symptoms include headaches.  Cough Associated symptoms: headaches     Past Medical History:  Diagnosis Date  . (HFpEF) heart failure with preserved ejection fraction ([place])    a. 05/2019 Echo: EF 65-70%, mod LVH. Gr2 DD. Mod AS.  Marland Kitchen AAA (abdominal aortic aneurysm) ([place])    a. 2014 Abd U/S: 3.5cm AAA; b. 10/20189 Abd U/S: 3.3cm AAA; c. 01/2018 CT Abd/Pelvis: 2.7cm infrarenal AAA - rec f/u U/S in 5 yrs.  . Back pain   . Esophageal reflux   . Essential hypertension   . History of stress test    a. 01/2012 MV: EF 63%, no rwma. No ischemia/infarct. Low risk.  . Mixed hyperlipidemia   . Moderate aortic stenosis    a. 05/2019 Echo: Mod AS. Mean grad 29.5 mmHg. Peak grad 55.7 mmHg. AVA 1.28 cm^2 (VTI). No significant change from 2019 echo.  . 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 PAD (peripheral artery disease) ([place])    a. 12/2012 and 05/2013 s/p bilat LE bypass @ [facility] in setting of bilat popliteal aneurysms; b. 05/2019 LE Duplex: R PT 50-74%, Nl L Leg.  . Parkinson's disease ([place])   . Persistent atrial fibrillation ([place])    a. 02/2015 Event monitor: long periods of Afib--prev on flecainide; b. CHA2DS2VASc = 8-->Eliquis 5 bid.  . Senile osteoporosis   . Skin cancer   . Swelling of right lower extremity    a. 05/2019 LE U/S: No DVT. 50-64% R PT artery stenosis.  Marland Kitchen TIA (transient ischemic  attack)   . Type II diabetes mellitus [facility])     Patient Active Problem List   Diagnosis Date Noted  . PAD (peripheral artery disease) ([place])   . Moderate aortic stenosis   . AAA (abdominal aortic aneurysm) ([place])   . (HFpEF) heart failure with preserved ejection fraction ([place])   . Type 2 diabetes mellitus with cardiac complication (HCC)   . Parkinson disease ([place]) 06/14/2018  . Behavioral change 03/25/2018  . Aortic valve stenosis 02/18/2018  . Dysphagia 12/10/2017  . Advanced care planning/counseling discussion 11/27/2016  . Pernicious anemia 05/28/2015  . Leg weakness, bilateral 04/12/2015  . Tremor 03/27/2015  . Chronic anxiety 02/20/2015  . SVT (supraventricular tachycardia) ([place]) 02/16/2015  . Paroxysmal atrial fibrillation ([place]) 02/16/2015  . History of back surgery 02/16/2015  . TIA (transient ischemic attack) 12/29/2011  . Hyperlipidemia associated with type 2 diabetes mellitus ([place]) 09/24/2011  . Hypertension associated with diabetes ([place]) 09/24/2011    Past Surgical History:  Procedure Laterality Date  . APPENDECTOMY    . BACK SURGERY    . CATARACT EXTRACTION    . COLONOSCOPY  [id number]  . ESOPHAGEAL DILATION    . GALLBLADDER SURGERY    . HERNIA REPAIR    . RETINAL DETACHMENT SURGERY    .  TONSILLECTOMY         Home Medications    Prior to Admission medications   Medication Sig Start Date End Date Taking? Authorizing Provider  acetaminophen (TYLENOL) 500 MG tablet Take 1,000 mg by mouth every 8 (eight) hours as needed for mild pain or moderate pain.    Yes [provider]  apixaban (ELIQUIS) 5 MG TABS tablet Take 1 tablet (5 mg total) by mouth 2 (two) times daily. 07/25/19  Yes Cannady, Jolene T, NP  Calcium Citrate-Vitamin D (CALCIUM + D PO) Take 1 tablet by mouth daily.    Yes [provider]  carbidopa-levodopa (SINEMET) 25-100 MG tablet Take 1 tablet by mouth 4 (four) times daily.    Yes [provider]  cyanocobalamin (,VITAMIN  B-12,) 1000 MCG/ML injection INJECT 1 ML INTO THE MUSCLE EVERY 30 DAYS 12/16/18  Yes Crissman, Jeannette How, MD  dapagliflozin propanediol (FARXIGA) 10 MG TABS tablet Take 10 mg by mouth daily. 12/16/18  Yes Crissman, Jeannette How, MD  glipiZIDE-metformin (METAGLIP) 5-500 MG tablet Take 1 tablet by mouth 2 (two) times daily. 12/16/18  Yes Crissman, Jeannette How, MD  Multiple Vitamins-Minerals (CEROVITE SENIOR PO) Take 1 tablet by mouth daily.   Yes [provider]  pantoprazole (PROTONIX) 40 MG tablet Take 1 tablet (40 mg total) by mouth 2 (two) times daily. 12/16/18  Yes Crissman, Jeannette How, MD  Probiotic Product ([place]) Take 1 capsule by mouth daily.    Yes [provider]  rosuvastatin (CRESTOR) 10 MG tablet Take 1 tablet (10 mg total) by mouth daily. 12/16/18  Yes Crissman, Jeannette How, MD  sitaGLIPtin (JANUVIA) 100 MG tablet Take 1 tablet (100 mg total) by mouth daily. 12/16/18  Yes Crissman, Jeannette How, MD  UNABLE TO FIND Take 100 mg by mouth daily. CBD 100mg    Yes [provider]  benzonatate (TESSALON) 200 MG capsule Take 1 capsule (200 mg total) by mouth 3 (three) times daily as needed. 09/14/19   Norval Gable, MD  sertraline (ZOLOFT) 50 MG tablet Take 1 tablet (50 mg total) by mouth at bedtime. 12/16/18   Guadalupe Maple, MD    Family History Family History  Problem Relation Age of Onset  . Heart disease Mother   . Diabetes Father   . Diabetes Other        grandparents  . Heart disease Brother   . Stroke Brother     Social History Social History   Tobacco Use  . Smoking status: Never Smoker  . Smokeless tobacco: Never Used  Substance Use Topics  . Alcohol use: No  . Drug use: No     Allergies   Patient has no known allergies.   Review of Systems Review of Systems  Respiratory: Positive for cough.   Neurological: Positive for headaches.     Physical Exam Triage Vital Signs ED Triage Vitals  Enc Vitals Group     BP 09/14/19 1241 115/67     Pulse  Rate 09/14/19 1241 95     Resp 09/14/19 1241 18     Temp 09/14/19 1241 99.6 F (37.6 C)     Temp Source 09/14/19 1241 Oral     SpO2 09/14/19 1241 97 %     Weight 09/14/19 1242 173 lb (78.5 kg)     Height 09/14/19 1242 5\' 9"  (1.753 m)     Head Circumference --      Peak Flow --      Pain Score 09/14/19  1241 9     Pain Loc --      Pain Edu? --      Excl. in [place]? --    No data found.  Updated Vital Signs BP 115/67 (BP Location: Left Arm)   Pulse 95   Temp 99.6 F (37.6 C) (Oral)   Resp 18   Ht 5\' 9"  (1.753 m)   Wt 78.5 kg   SpO2 97%   BMI 25.55 kg/m   Visual Acuity Right Eye Distance:   Left Eye Distance:   Bilateral Distance:    Right Eye Near:   Left Eye Near:    Bilateral Near:     Physical Exam Vitals and nursing note reviewed.  Constitutional:      General: He is not in acute distress.    Appearance: He is not toxic-appearing or diaphoretic.  HENT:     Right Ear: Tympanic membrane normal.     Left Ear: Tympanic membrane normal.     Nose: Congestion and rhinorrhea present.     Mouth/Throat:     Pharynx: Posterior oropharyngeal erythema present.  Cardiovascular:     Rate and Rhythm: Normal rate.     Heart sounds: Normal heart sounds.  Pulmonary:     Effort: Pulmonary effort is normal. No respiratory distress.     Breath sounds: Normal breath sounds.  Neurological:     General: No focal deficit present.     Mental Status: He is alert.      UC Treatments / Results  Labs (all labs ordered are listed, but only abnormal results are displayed) Labs Reviewed - No data to display  EKG   Radiology No results found.  Procedures Procedures (including critical care time)  Medications Ordered in UC Medications - No data to display  Initial Impression / Assessment and Plan / UC Course  I have reviewed the triage vital signs and the nursing notes.  Pertinent labs & imaging results that were available during my care of the patient were reviewed by me  and considered in my medical decision making (see chart for details).      Final Clinical Impressions(s) / UC Diagnoses   Final diagnoses:  Environmental allergies  Post-nasal drip  Cough     Discharge Instructions     Over the counter allergy medication and steroid nose spray daily    ED Prescriptions    Medication Sig Dispense Auth. Provider   benzonatate (TESSALON) 200 MG capsule Take 1 capsule (200 mg total) by mouth 3 (three) times daily as needed. 30 capsule Norval Gable, MD     1. diagnosis reviewed with patient and caretaker 2. rx as per orders above; reviewed possible side effects, interactions, risks and benefits  3. Recommend supportive treatment as above 4. Follow-up prn if symptoms worsen or don't improve  PDMP not reviewed this encounter.   Norval Gable, MD 09/14/19 1950

## 2019-09-14 NOTE — ED Triage Notes (Signed)
Patient in today c/o 3 day history of headache,weakness, sore throat, cough and low grade fever (99.5). Patient has taken OTC Tylenol, throat lozenges and allergy pill for symptoms. Patient has had both covid vaccines.

## 2019-09-14 NOTE — Discharge Instructions (Signed)
Over the counter allergy medication and steroid nose spray daily

## 2019-09-18 ENCOUNTER — Emergency Department: Payer: Medicare Other

## 2019-09-18 ENCOUNTER — Other Ambulatory Visit: Payer: Self-pay

## 2019-09-18 ENCOUNTER — Encounter: Payer: Self-pay | Admitting: Emergency Medicine

## 2019-09-18 ENCOUNTER — Inpatient Hospital Stay
Admission: EM | Admit: 2019-09-18 | Discharge: 2019-09-22 | DRG: 177 | Disposition: A | Discharge status: Skilled Nursing Facility | Payer: Medicare Other | Attending: Student | Admitting: Student

## 2019-09-18 DIAGNOSIS — M810 Age-related osteoporosis without current pathological fracture: Secondary | ICD-10-CM | POA: Diagnosis present

## 2019-09-18 DIAGNOSIS — J9601 Acute respiratory failure with hypoxia: Secondary | ICD-10-CM | POA: Diagnosis present

## 2019-09-18 DIAGNOSIS — J1282 Pneumonia due to coronavirus disease 2019: Secondary | ICD-10-CM | POA: Diagnosis present

## 2019-09-18 DIAGNOSIS — Z8673 Personal history of transient ischemic attack (TIA), and cerebral infarction without residual deficits: Secondary | ICD-10-CM | POA: Diagnosis not present

## 2019-09-18 DIAGNOSIS — Z7401 Bed confinement status: Secondary | ICD-10-CM | POA: Diagnosis not present

## 2019-09-18 DIAGNOSIS — E441 Mild protein-calorie malnutrition: Secondary | ICD-10-CM | POA: Diagnosis not present

## 2019-09-18 DIAGNOSIS — Z85828 Personal history of other malignant neoplasm of skin: Secondary | ICD-10-CM

## 2019-09-18 DIAGNOSIS — I350 Nonrheumatic aortic (valve) stenosis: Secondary | ICD-10-CM | POA: Diagnosis present

## 2019-09-18 DIAGNOSIS — I5032 Chronic diastolic (congestive) heart failure: Secondary | ICD-10-CM | POA: Diagnosis present

## 2019-09-18 DIAGNOSIS — R0689 Other abnormalities of breathing: Secondary | ICD-10-CM | POA: Diagnosis not present

## 2019-09-18 DIAGNOSIS — I714 Abdominal aortic aneurysm, without rupture: Secondary | ICD-10-CM | POA: Diagnosis present

## 2019-09-18 DIAGNOSIS — R410 Disorientation, unspecified: Secondary | ICD-10-CM | POA: Diagnosis present

## 2019-09-18 DIAGNOSIS — I2510 Atherosclerotic heart disease of native coronary artery without angina pectoris: Secondary | ICD-10-CM | POA: Diagnosis not present

## 2019-09-18 DIAGNOSIS — I10 Essential (primary) hypertension: Secondary | ICD-10-CM

## 2019-09-18 DIAGNOSIS — R5381 Other malaise: Secondary | ICD-10-CM | POA: Diagnosis not present

## 2019-09-18 DIAGNOSIS — Z7984 Long term (current) use of oral hypoglycemic drugs: Secondary | ICD-10-CM

## 2019-09-18 DIAGNOSIS — U071 COVID-19: Secondary | ICD-10-CM | POA: Diagnosis present

## 2019-09-18 DIAGNOSIS — I480 Paroxysmal atrial fibrillation: Secondary | ICD-10-CM

## 2019-09-18 DIAGNOSIS — R0600 Dyspnea, unspecified: Secondary | ICD-10-CM | POA: Diagnosis not present

## 2019-09-18 DIAGNOSIS — Z79899 Other long term (current) drug therapy: Secondary | ICD-10-CM

## 2019-09-18 DIAGNOSIS — E785 Hyperlipidemia, unspecified: Secondary | ICD-10-CM | POA: Diagnosis not present

## 2019-09-18 DIAGNOSIS — M6281 Muscle weakness (generalized): Secondary | ICD-10-CM | POA: Diagnosis not present

## 2019-09-18 DIAGNOSIS — M889 Osteitis deformans of unspecified bone: Secondary | ICD-10-CM | POA: Diagnosis present

## 2019-09-18 DIAGNOSIS — E119 Type 2 diabetes mellitus without complications: Secondary | ICD-10-CM | POA: Diagnosis not present

## 2019-09-18 DIAGNOSIS — Z7901 Long term (current) use of anticoagulants: Secondary | ICD-10-CM | POA: Diagnosis not present

## 2019-09-18 DIAGNOSIS — G20 Parkinson's disease: Secondary | ICD-10-CM | POA: Diagnosis present

## 2019-09-18 DIAGNOSIS — J9600 Acute respiratory failure, unspecified whether with hypoxia or hypercapnia: Secondary | ICD-10-CM | POA: Diagnosis not present

## 2019-09-18 DIAGNOSIS — I4819 Other persistent atrial fibrillation: Secondary | ICD-10-CM | POA: Diagnosis present

## 2019-09-18 DIAGNOSIS — D649 Anemia, unspecified: Secondary | ICD-10-CM | POA: Diagnosis not present

## 2019-09-18 DIAGNOSIS — R2681 Unsteadiness on feet: Secondary | ICD-10-CM | POA: Diagnosis not present

## 2019-09-18 DIAGNOSIS — E1151 Type 2 diabetes mellitus with diabetic peripheral angiopathy without gangrene: Secondary | ICD-10-CM | POA: Diagnosis present

## 2019-09-18 DIAGNOSIS — Z833 Family history of diabetes mellitus: Secondary | ICD-10-CM

## 2019-09-18 DIAGNOSIS — Z8249 Family history of ischemic heart disease and other diseases of the circulatory system: Secondary | ICD-10-CM | POA: Diagnosis not present

## 2019-09-18 DIAGNOSIS — K219 Gastro-esophageal reflux disease without esophagitis: Secondary | ICD-10-CM | POA: Diagnosis present

## 2019-09-18 DIAGNOSIS — M2550 Pain in unspecified joint: Secondary | ICD-10-CM | POA: Diagnosis not present

## 2019-09-18 DIAGNOSIS — R509 Fever, unspecified: Secondary | ICD-10-CM | POA: Diagnosis not present

## 2019-09-18 DIAGNOSIS — R41841 Cognitive communication deficit: Secondary | ICD-10-CM | POA: Diagnosis not present

## 2019-09-18 DIAGNOSIS — R05 Cough: Secondary | ICD-10-CM | POA: Diagnosis not present

## 2019-09-18 DIAGNOSIS — Z823 Family history of stroke: Secondary | ICD-10-CM

## 2019-09-18 DIAGNOSIS — E1169 Type 2 diabetes mellitus with other specified complication: Secondary | ICD-10-CM

## 2019-09-18 DIAGNOSIS — E782 Mixed hyperlipidemia: Secondary | ICD-10-CM | POA: Diagnosis present

## 2019-09-18 DIAGNOSIS — G118 Other hereditary ataxias: Secondary | ICD-10-CM | POA: Diagnosis not present

## 2019-09-18 DIAGNOSIS — M6389 Disorders of muscle in diseases classified elsewhere, multiple sites: Secondary | ICD-10-CM | POA: Diagnosis not present

## 2019-09-18 DIAGNOSIS — I110 Hypertensive heart disease with heart failure: Secondary | ICD-10-CM | POA: Diagnosis present

## 2019-09-18 DIAGNOSIS — I739 Peripheral vascular disease, unspecified: Secondary | ICD-10-CM | POA: Diagnosis not present

## 2019-09-18 DIAGNOSIS — R918 Other nonspecific abnormal finding of lung field: Secondary | ICD-10-CM | POA: Diagnosis not present

## 2019-09-18 DIAGNOSIS — R0902 Hypoxemia: Secondary | ICD-10-CM | POA: Diagnosis not present

## 2019-09-18 DIAGNOSIS — F05 Delirium due to known physiological condition: Secondary | ICD-10-CM | POA: Diagnosis not present

## 2019-09-18 LAB — GLUCOSE, CAPILLARY
Glucose-Capillary: 151 mg/dL — ABNORMAL HIGH (ref 70–99)
Glucose-Capillary: 224 mg/dL — ABNORMAL HIGH (ref 70–99)

## 2019-09-18 LAB — COMPREHENSIVE METABOLIC PANEL
ALT: 24 U/L (ref 0–44)
AST: 39 U/L (ref 15–41)
Albumin: 3.4 g/dL — ABNORMAL LOW (ref 3.5–5.0)
Alkaline Phosphatase: 92 U/L (ref 38–126)
Anion gap: 12 (ref 5–15)
BUN: 17 mg/dL (ref 8–23)
CO2: 21 mmol/L — ABNORMAL LOW (ref 22–32)
Calcium: 8.2 mg/dL — ABNORMAL LOW (ref 8.9–10.3)
Chloride: 101 mmol/L (ref 98–111)
Creatinine, Ser: 1.23 mg/dL (ref 0.61–1.24)
GFR calc Af Amer: >60 mL/min (ref >60)
GFR calc non Af Amer: 52 mL/min — ABNORMAL LOW (ref >60)
Glucose, Bld: 150 mg/dL — ABNORMAL HIGH (ref 70–99)
Potassium: 3.7 mmol/L (ref 3.5–5.1)
Sodium: 134 mmol/L — ABNORMAL LOW (ref 135–145)
Total Bilirubin: 0.9 mg/dL (ref 0.3–1.2)
Total Protein: 7.1 g/dL (ref 6.5–8.1)

## 2019-09-18 LAB — LACTIC ACID, PLASMA
Lactic Acid, Venous: 1.2 mmol/L (ref 0.5–1.9)
Lactic Acid, Venous: 1.2 mmol/L (ref 0.5–1.9)

## 2019-09-18 LAB — C-REACTIVE PROTEIN: CRP: 21.7 mg/dL — ABNORMAL HIGH (ref <1.0)

## 2019-09-18 LAB — HEMOGLOBIN A1C
Hgb A1c MFr Bld: 7.6 % — ABNORMAL HIGH (ref 4.8–5.6)
Mean Plasma Glucose: 171.42 mg/dL

## 2019-09-18 LAB — LACTATE DEHYDROGENASE: LDH: 223 U/L — ABNORMAL HIGH (ref 98–192)

## 2019-09-18 LAB — CBC WITH DIFFERENTIAL/PLATELET
Abs Immature Granulocytes: 0.08 10*3/uL — ABNORMAL HIGH (ref 0.00–0.07)
Basophils Absolute: 0.1 10*3/uL (ref 0.0–0.1)
Basophils Relative: 1 %
Eosinophils Absolute: 0.3 10*3/uL (ref 0.0–0.5)
Eosinophils Relative: 3 %
HCT: 32.4 % — ABNORMAL LOW (ref 39.0–52.0)
Hemoglobin: 10.2 g/dL — ABNORMAL LOW (ref 13.0–17.0)
Immature Granulocytes: 1 %
Lymphocytes Relative: 8 %
Lymphs Abs: 0.8 10*3/uL (ref 0.7–4.0)
MCH: 28 pg (ref 26.0–34.0)
MCHC: 31.5 g/dL (ref 30.0–36.0)
MCV: 89 fL (ref 80.0–100.0)
Monocytes Absolute: 1.5 10*3/uL — ABNORMAL HIGH (ref 0.1–1.0)
Monocytes Relative: 15 %
Neutro Abs: 7.2 10*3/uL (ref 1.7–7.7)
Neutrophils Relative %: 72 %
Platelets: 206 10*3/uL (ref 150–400)
RBC: 3.64 MIL/uL — ABNORMAL LOW (ref 4.22–5.81)
RDW: 15.2 % (ref 11.5–15.5)
WBC: 9.8 10*3/uL (ref 4.0–10.5)
nRBC: 0 % (ref 0.0–0.2)

## 2019-09-18 LAB — FERRITIN: Ferritin: 98 ng/mL (ref 24–336)

## 2019-09-18 LAB — URINALYSIS, ROUTINE W REFLEX MICROSCOPIC
Bacteria, UA: NONE SEEN
Bilirubin Urine: NEGATIVE
Glucose, UA: >=500 mg/dL — AB
Ketones, ur: 20 mg/dL — AB
Leukocytes,Ua: NEGATIVE
Nitrite: NEGATIVE
Protein, ur: 30 mg/dL — AB
Specific Gravity, Urine: 1.024 (ref 1.005–1.030)
pH: 5 (ref 5.0–8.0)

## 2019-09-18 LAB — PROTIME-INR
INR: 1.1 (ref 0.8–1.2)
Prothrombin Time: 14.3 seconds (ref 11.4–15.2)

## 2019-09-18 LAB — PROCALCITONIN: Procalcitonin: 0.14 ng/mL

## 2019-09-18 LAB — ABO/RH: ABO/RH(D): A POS

## 2019-09-18 LAB — POC SARS CORONAVIRUS 2 AG: SARS Coronavirus 2 Ag: POSITIVE — AB

## 2019-09-18 LAB — FIBRIN DERIVATIVES D-DIMER (ARMC ONLY): Fibrin derivatives D-dimer (ARMC): 3087.11 ng/mL (FEU) — ABNORMAL HIGH (ref 0.00–499.00)

## 2019-09-18 LAB — TROPONIN I (HIGH SENSITIVITY)
Troponin I (High Sensitivity): 25 ng/L — ABNORMAL HIGH (ref <18)
Troponin I (High Sensitivity): 26 ng/L — ABNORMAL HIGH (ref <18)

## 2019-09-18 LAB — FIBRINOGEN: Fibrinogen: 739 mg/dL — ABNORMAL HIGH (ref 210–475)

## 2019-09-18 MED ORDER — SODIUM CHLORIDE 0.9 % IV BOLUS
500.0000 mL | Freq: Once | INTRAVENOUS | Status: AC
  Administered 2019-09-18: 500 mL via INTRAVENOUS

## 2019-09-18 MED ORDER — DEXAMETHASONE SODIUM PHOSPHATE 10 MG/ML IJ SOLN: 10.0000 mg | Freq: Once | INTRAMUSCULAR | Status: DC

## 2019-09-18 MED ORDER — IPRATROPIUM-ALBUTEROL 20-100 MCG/ACT IN AERS
1.0000 | INHALATION_SPRAY | Freq: Four times a day (QID) | RESPIRATORY_TRACT | Status: DC
  Administered 2019-09-19 – 2019-09-22 (×14): 1 via RESPIRATORY_TRACT
  Filled 2019-09-18: qty 4

## 2019-09-18 MED ORDER — METOPROLOL SUCCINATE ER 50 MG PO TB24
50.0000 mg | ORAL_TABLET | Freq: Every day | ORAL | Status: DC
  Administered 2019-09-18 – 2019-09-22 (×5): 50 mg via ORAL
  Filled 2019-09-18 (×5): qty 1

## 2019-09-18 MED ORDER — LINAGLIPTIN 5 MG PO TABS
5.0000 mg | ORAL_TABLET | Freq: Every day | ORAL | Status: DC
  Administered 2019-09-20 – 2019-09-22 (×3): 5 mg via ORAL
  Filled 2019-09-18 (×4): qty 1

## 2019-09-18 MED ORDER — CARBIDOPA-LEVODOPA 25-100 MG PO TABS
1.0000 | ORAL_TABLET | Freq: Four times a day (QID) | ORAL | Status: DC
  Administered 2019-09-19 – 2019-09-22 (×14): 1 via ORAL
  Filled 2019-09-18 (×20): qty 1

## 2019-09-18 MED ORDER — SODIUM CHLORIDE 0.9 % IV SOLN
2.0000 g | Freq: Once | INTRAVENOUS | Status: AC
  Administered 2019-09-18: 2 g via INTRAVENOUS
  Filled 2019-09-18: qty 20

## 2019-09-18 MED ORDER — PANTOPRAZOLE SODIUM 40 MG PO TBEC
40.0000 mg | DELAYED_RELEASE_TABLET | Freq: Two times a day (BID) | ORAL | Status: DC
  Administered 2019-09-18 – 2019-09-22 (×9): 40 mg via ORAL
  Filled 2019-09-18 (×9): qty 1

## 2019-09-18 MED ORDER — HYDROCOD POLST-CPM POLST ER 10-8 MG/5ML PO SUER: 5.0000 mL | Freq: Two times a day (BID) | ORAL | Status: DC | PRN

## 2019-09-18 MED ORDER — SODIUM CHLORIDE 0.9 % IV SOLN
500.0000 mg | Freq: Once | INTRAVENOUS | Status: AC
  Administered 2019-09-18: 12:00:00 500 mg via INTRAVENOUS
  Filled 2019-09-18: qty 500

## 2019-09-18 MED ORDER — SODIUM CHLORIDE 0.9 % IV SOLN
100.0000 mg | Freq: Every day | INTRAVENOUS | Status: AC
  Administered 2019-09-19 – 2019-09-22 (×4): 100 mg via INTRAVENOUS
  Filled 2019-09-18 (×4): qty 20
  Filled 2019-09-18: qty 100

## 2019-09-18 MED ORDER — FLECAINIDE ACETATE 50 MG PO TABS
50.0000 mg | ORAL_TABLET | Freq: Two times a day (BID) | ORAL | Status: DC
  Administered 2019-09-19 – 2019-09-22 (×8): 50 mg via ORAL
  Filled 2019-09-18 (×12): qty 1

## 2019-09-18 MED ORDER — APIXABAN 5 MG PO TABS
5.0000 mg | ORAL_TABLET | Freq: Two times a day (BID) | ORAL | Status: DC
  Administered 2019-09-18 – 2019-09-22 (×9): 5 mg via ORAL
  Filled 2019-09-18 (×9): qty 1

## 2019-09-18 MED ORDER — DEXAMETHASONE 4 MG PO TABS
6.0000 mg | ORAL_TABLET | ORAL | Status: DC
  Administered 2019-09-19 – 2019-09-21 (×3): 6 mg via ORAL
  Filled 2019-09-18 (×3): qty 2

## 2019-09-18 MED ORDER — CALCIUM CARBONATE-VITAMIN D 500-200 MG-UNIT PO TABS
2.0000 | ORAL_TABLET | Freq: Every day | ORAL | Status: DC
  Administered 2019-09-19 – 2019-09-22 (×4): 2 via ORAL
  Filled 2019-09-18 (×5): qty 2

## 2019-09-18 MED ORDER — INSULIN DETEMIR 100 UNIT/ML ~~LOC~~ SOLN: 0.1000 [IU]/kg | Freq: Two times a day (BID) | SUBCUTANEOUS | Status: DC

## 2019-09-18 MED ORDER — ZINC SULFATE 220 (50 ZN) MG PO CAPS
220.0000 mg | ORAL_CAPSULE | Freq: Every day | ORAL | Status: DC
  Administered 2019-09-18 – 2019-09-22 (×5): 220 mg via ORAL
  Filled 2019-09-18 (×5): qty 1

## 2019-09-18 MED ORDER — ASCORBIC ACID 500 MG PO TABS
500.0000 mg | ORAL_TABLET | Freq: Every day | ORAL | Status: DC
  Administered 2019-09-18 – 2019-09-22 (×5): 500 mg via ORAL
  Filled 2019-09-18 (×5): qty 1

## 2019-09-18 MED ORDER — INSULIN ASPART 100 UNIT/ML ~~LOC~~ SOLN
4.0000 [IU] | Freq: Three times a day (TID) | SUBCUTANEOUS | Status: DC
  Administered 2019-09-19: 09:00:00 4 [IU] via SUBCUTANEOUS
  Filled 2019-09-18: qty 1

## 2019-09-18 MED ORDER — INSULIN ASPART 100 UNIT/ML ~~LOC~~ SOLN
0.0000 [IU] | Freq: Three times a day (TID) | SUBCUTANEOUS | Status: DC
  Administered 2019-09-18: 3 [IU] via SUBCUTANEOUS
  Administered 2019-09-19: 8 [IU] via SUBCUTANEOUS
  Administered 2019-09-19 (×2): 3 [IU] via SUBCUTANEOUS
  Administered 2019-09-20 (×2): 5 [IU] via SUBCUTANEOUS
  Administered 2019-09-20: 09:00:00 3 [IU] via SUBCUTANEOUS
  Administered 2019-09-21: 09:00:00 5 [IU] via SUBCUTANEOUS
  Administered 2019-09-21: 8 [IU] via SUBCUTANEOUS
  Administered 2019-09-21: 18:00:00 3 [IU] via SUBCUTANEOUS
  Administered 2019-09-22: 11 [IU] via SUBCUTANEOUS
  Administered 2019-09-22: 8 [IU] via SUBCUTANEOUS
  Filled 2019-09-18 (×12): qty 1

## 2019-09-18 MED ORDER — GUAIFENESIN-DM 100-10 MG/5ML PO SYRP
10.0000 mL | ORAL_SOLUTION | ORAL | Status: DC | PRN
  Filled 2019-09-18: qty 10

## 2019-09-18 MED ORDER — ROSUVASTATIN CALCIUM 10 MG PO TABS
10.0000 mg | ORAL_TABLET | Freq: Every day | ORAL | Status: DC
  Administered 2019-09-19 – 2019-09-21 (×3): 10 mg via ORAL
  Filled 2019-09-18 (×3): qty 1

## 2019-09-18 MED ORDER — INSULIN ASPART 100 UNIT/ML ~~LOC~~ SOLN
0.0000 [IU] | Freq: Every day | SUBCUTANEOUS | Status: DC
  Administered 2019-09-19 – 2019-09-20 (×3): 2 [IU] via SUBCUTANEOUS
  Filled 2019-09-18 (×4): qty 1

## 2019-09-18 MED ORDER — ACETAMINOPHEN 325 MG PO TABS: 650.0000 mg | ORAL_TABLET | Freq: Four times a day (QID) | ORAL | Status: DC | PRN

## 2019-09-18 MED ORDER — ONDANSETRON HCL 4 MG PO TABS: 4.0000 mg | ORAL_TABLET | Freq: Four times a day (QID) | ORAL | Status: DC | PRN

## 2019-09-18 MED ORDER — DEXAMETHASONE SODIUM PHOSPHATE 10 MG/ML IJ SOLN
6.0000 mg | Freq: Once | INTRAMUSCULAR | Status: AC
  Administered 2019-09-18: 6 mg via INTRAVENOUS
  Filled 2019-09-18: qty 1

## 2019-09-18 MED ORDER — POLYETHYLENE GLYCOL 3350 17 G PO PACK: 17.0000 g | PACK | Freq: Every day | ORAL | Status: DC | PRN

## 2019-09-18 MED ORDER — SODIUM CHLORIDE 0.9 % IV SOLN: 200.0000 mg | Freq: Once | INTRAVENOUS | Status: DC

## 2019-09-18 MED ORDER — SODIUM CHLORIDE 0.9 % IV SOLN: 100.0000 mg | Freq: Every day | INTRAVENOUS | Status: DC

## 2019-09-18 MED ORDER — ONDANSETRON HCL 4 MG/2ML IJ SOLN: 4.0000 mg | Freq: Four times a day (QID) | INTRAMUSCULAR | Status: DC | PRN

## 2019-09-18 MED ORDER — SODIUM CHLORIDE 0.9 % IV SOLN
200.0000 mg | Freq: Once | INTRAVENOUS | Status: AC
  Administered 2019-09-18: 200 mg via INTRAVENOUS
  Filled 2019-09-18: qty 40

## 2019-09-18 NOTE — ED Provider Notes (Addendum)
[facility] Emergency Department Provider Note  ____________________________________________   First MD Initiated Contact with Patient 09/18/19 1108     (approximate)  I have reviewed the triage vital signs and the nursing notes.   HISTORY  Chief Complaint Weakness and Fever    HPI Timothy [name] is a 86 y.o. male with extensive past medical history as below including heart failure, Parkinson's, A. fib, here with shortness of breath.  Patient states that for the last 2 weeks or so, he has had progressively worsening shortness of breath with cough and wheezing.  He went to an urgent care/walk-in clinic earlier this week, and was told that he had allergies.  However, for the last 4 to 5 days, he has had fevers, fatigue, and progressively worsening energy.  He states that he has been essentially unable to get out of bed for the last several days.  He does not have known sick contacts, but does have someone else in the home with him.  He denies any nausea or vomiting, but has had little appetite.  He has shortness of breath with any amount of exertion, as well as now at rest.  No chest pain.   Has received his Covid vaccinations.       Past Medical History:  Diagnosis Date  . (HFpEF) heart failure with preserved ejection fraction ([place])    a. 05/2019 Echo: EF 65-70%, mod LVH. Gr2 DD. Mod AS.  Marland Kitchen AAA (abdominal aortic aneurysm) ([place])    a. 2014 Abd U/S: 3.5cm AAA; b. 10/20189 Abd U/S: 3.3cm AAA; c. 01/2018 CT Abd/Pelvis: 2.7cm infrarenal AAA - rec f/u U/S in 5 yrs.  . Back pain   . Esophageal reflux   . Essential hypertension   . History of stress test    a. 01/2012 MV: EF 63%, no rwma. No ischemia/infarct. Low risk.  . Mixed hyperlipidemia   . Moderate aortic stenosis    a. 05/2019 Echo: Mod AS. Mean grad 29.5 mmHg. Peak grad 55.7 mmHg. AVA 1.28 cm^2 (VTI). No significant change from 2019 echo.  . Osteitis deformans without mention of bone tumor    Paget's  Disease  . PAD (peripheral artery disease) ([place])    a. 12/2012 and 05/2013 s/p bilat LE bypass @ [facility] in setting of bilat popliteal aneurysms; b. 05/2019 LE Duplex: R PT 50-74%, Nl L Leg.  . Parkinson's disease ([place])   . Persistent atrial fibrillation ([place])    a. 02/2015 Event monitor: long periods of Afib--prev on flecainide; b. CHA2DS2VASc = 8-->Eliquis 5 bid.  . Senile osteoporosis   . Skin cancer   . Swelling of right lower extremity    a. 05/2019 LE U/S: No DVT. 50-64% R PT artery stenosis.  Marland Kitchen TIA (transient ischemic attack)   . Type II diabetes mellitus [facility])     Patient Active Problem List   Diagnosis Date Noted  . Pneumonia due to COVID-19 virus 09/18/2019  . PAD (peripheral artery disease) ([place])   . Moderate aortic stenosis   . AAA (abdominal aortic aneurysm) ([place])   . (HFpEF) heart failure with preserved ejection fraction ([place])   . Type 2 diabetes mellitus without complication, without long-term current use of insulin ([place])   . Parkinson disease ([place]) 06/14/2018  . Behavioral change 03/25/2018  . Aortic valve stenosis 02/18/2018  . Dysphagia 12/10/2017  . Advanced care planning/counseling discussion 11/27/2016  . Pernicious anemia 05/28/2015  . Leg weakness, bilateral 04/12/2015  . Tremor 03/27/2015  . Chronic anxiety  02/20/2015  . SVT (supraventricular tachycardia) ([place]) 02/16/2015  . AF (paroxysmal atrial fibrillation) ([place]) 02/16/2015  . History of back surgery 02/16/2015  . TIA (transient ischemic attack) 12/29/2011  . Hyperlipidemia associated with type 2 diabetes mellitus ([place]) 09/24/2011  . Essential hypertension 09/24/2011    Past Surgical History:  Procedure Laterality Date  . APPENDECTOMY    . BACK SURGERY    . CATARACT EXTRACTION    . COLONOSCOPY  [id number]  . ESOPHAGEAL DILATION    . GALLBLADDER SURGERY    . HERNIA REPAIR    . RETINAL DETACHMENT SURGERY    . TONSILLECTOMY      Prior to Admission medications   Medication Sig Start Date End Date  Taking? Authorizing Provider  apixaban (ELIQUIS) 5 MG TABS tablet Take 1 tablet (5 mg total) by mouth 2 (two) times daily. 07/25/19  Yes Cannady, Jolene T, NP  benzonatate (TESSALON) 200 MG capsule Take 1 capsule (200 mg total) by mouth 3 (three) times daily as needed. 09/14/19  Yes Norval Gable, MD  Calcium Citrate-Vitamin D (CALCIUM + D PO) Take 1 tablet by mouth daily.    Yes [provider]  carbidopa-levodopa (SINEMET) 25-100 MG tablet Take 1 tablet by mouth 4 (four) times daily.    Yes [provider]  dapagliflozin propanediol (FARXIGA) 10 MG TABS tablet Take 10 mg by mouth daily. 12/16/18  Yes Crissman, Jeannette How, MD  flecainide (TAMBOCOR) 50 MG tablet Take 50 mg by mouth 2 (two) times daily. 08/26/19  Yes [provider]  glipiZIDE-metformin (METAGLIP) 5-500 MG tablet Take 1 tablet by mouth 2 (two) times daily. 12/16/18  Yes Crissman, Jeannette How, MD  metoprolol succinate (TOPROL-XL) 50 MG 24 hr tablet Take 50 mg by mouth daily. 08/26/19  Yes [provider]  Multiple Vitamins-Minerals (CEROVITE SENIOR PO) Take 1 tablet by mouth daily.   Yes [provider]  pantoprazole (PROTONIX) 40 MG tablet Take 1 tablet (40 mg total) by mouth 2 (two) times daily. 12/16/18  Yes Crissman, Jeannette How, MD  rosuvastatin (CRESTOR) 10 MG tablet Take 1 tablet (10 mg total) by mouth daily. 12/16/18  Yes Crissman, Jeannette How, MD  sitaGLIPtin (JANUVIA) 100 MG tablet Take 1 tablet (100 mg total) by mouth daily. 12/16/18  Yes Crissman, Jeannette How, MD  UNABLE TO FIND Take 100 mg by mouth daily. CBD 100mg    Yes [provider]    Allergies Patient has no known allergies.  Family History  Problem Relation Age of Onset  . Heart disease Mother   . Diabetes Father   . Diabetes Other        grandparents  . Heart disease Brother   . Stroke Brother     Social History Social History   Tobacco Use  . Smoking status: Never Smoker  . Smokeless tobacco: Never Used  Substance Use  Topics  . Alcohol use: No  . Drug use: No    Review of Systems  Review of Systems  Constitutional: Positive for appetite change and fatigue. Negative for chills and fever.  HENT: Negative for sore throat.   Respiratory: Positive for cough and shortness of breath.   Cardiovascular: Negative for chest pain.  Gastrointestinal: Positive for nausea. Negative for abdominal pain.  Genitourinary: Negative for flank pain.  Musculoskeletal: Negative for neck pain.  Skin: Negative for rash and wound.  Allergic/Immunologic: Negative for immunocompromised state.  Neurological: Positive for weakness. Negative for numbness.  Hematological: Does not bruise/bleed easily.  All other systems  reviewed and are negative.    ____________________________________________  PHYSICAL EXAM:      VITAL SIGNS: ED Triage Vitals  Enc Vitals Group     BP 09/18/19 1038 (!) 150/77     Pulse Rate 09/18/19 1038 86     Resp 09/18/19 1038 (!) 28     Temp 09/18/19 1038 100.1 F (37.8 C)     Temp Source 09/18/19 1038 Oral     SpO2 09/18/19 1038 96 %     Weight 09/18/19 1034 171 lb 15.3 oz (78 kg)     Height 09/18/19 1034 5\' 9"  (1.753 m)     Head Circumference --      Peak Flow --      Pain Score 09/18/19 1034 0     Pain Loc --      Pain Edu? --      Excl. in [place]? --      Physical Exam Vitals and nursing note reviewed.  Constitutional:      General: He is not in acute distress.    Appearance: He is well-developed.  HENT:     Head: Normocephalic and atraumatic.     Mouth/Throat:     Mouth: Mucous membranes are dry.  Eyes:     Conjunctiva/sclera: Conjunctivae normal.  Cardiovascular:     Rate and Rhythm: Regular rhythm. Tachycardia present.     Heart sounds: Normal heart sounds. No murmur. No friction rub.  Pulmonary:     Effort: Pulmonary effort is normal. Tachypnea present. No respiratory distress.     Breath sounds: Examination of the right-middle field reveals rales. Examination of the  left-middle field reveals rales. Examination of the right-lower field reveals rales. Examination of the left-lower field reveals rales. Decreased breath sounds and rales present. No wheezing.  Abdominal:     General: There is no distension.     Palpations: Abdomen is soft.     Tenderness: There is no abdominal tenderness.  Musculoskeletal:     Cervical back: Neck supple.  Skin:    General: Skin is warm.     Capillary Refill: Capillary refill takes less than 2 seconds.     Findings: No rash.  Neurological:     Mental Status: He is alert and oriented to person, place, and time.     Motor: No abnormal muscle tone.       ____________________________________________   LABS (all labs ordered are listed, but only abnormal results are displayed)  Labs Reviewed  COMPREHENSIVE METABOLIC PANEL - Abnormal; Notable for the following components:      Result Value   Sodium 134 (*)    CO2 21 (*)    Glucose, Bld 150 (*)    Calcium 8.2 (*)    Albumin 3.4 (*)    GFR calc non Af Amer 52 (*)    All other components within normal limits  CBC WITH DIFFERENTIAL/PLATELET - Abnormal; Notable for the following components:   RBC 3.64 (*)    Hemoglobin 10.2 (*)    HCT 32.4 (*)    Monocytes Absolute 1.5 (*)    Abs Immature Granulocytes 0.08 (*)    All other components within normal limits  URINALYSIS, ROUTINE W REFLEX MICROSCOPIC - Abnormal; Notable for the following components:   Color, Urine YELLOW (*)    APPearance CLEAR (*)    Glucose, UA >=500 (*)    Hgb urine dipstick SMALL (*)    Ketones, ur 20 (*)    Protein, ur 30 (*)  All other components within normal limits  FIBRINOGEN - Abnormal; Notable for the following components:   Fibrinogen 739 (*)    All other components within normal limits  POC SARS CORONAVIRUS 2 AG - Abnormal; Notable for the following components:   SARS Coronavirus 2 Ag POSITIVE (*)    All other components within normal limits  CULTURE, BLOOD (ROUTINE X 2)  CULTURE,  BLOOD (ROUTINE X 2)  URINE CULTURE  PROTIME-INR  LACTIC ACID, PLASMA  LACTIC ACID, PLASMA  C-REACTIVE PROTEIN  FIBRIN DERIVATIVES D-DIMER ([facility] ONLY)  FERRITIN  LACTATE DEHYDROGENASE  PROCALCITONIN  HEMOGLOBIN A1C  CBC WITH DIFFERENTIAL/PLATELET  COMPREHENSIVE METABOLIC PANEL  C-REACTIVE PROTEIN  FIBRIN DERIVATIVES D-DIMER ([facility] ONLY)  FERRITIN  MAGNESIUM  PHOSPHORUS  POC SARS CORONAVIRUS 2 AG -  ED  ABO/RH  TROPONIN I (HIGH SENSITIVITY)  TROPONIN I (HIGH SENSITIVITY)    ____________________________________________  EKG: Normal sinus rhythm, ventricular rate 82.  QRS 115, QTc 470.  Incomplete bundle branch, no acute ST elevations or depressions. ________________________________________  RADIOLOGY All imaging, including plain films, CT scans, and ultrasounds, independently reviewed by me, and interpretations confirmed via formal radiology reads.  ED MD interpretation:   CXR: Multifocal PNA   Official radiology report(s): DG Chest Port 1 View  Result Date: 09/18/2019 CLINICAL DATA:  Dyspnea EXAM: PORTABLE CHEST 1 VIEW COMPARISON:  05/04/2018 chest radiograph. FINDINGS: Stable cardiomediastinal silhouette with normal heart size. No pneumothorax. No pleural effusion. Hazy patchy opacities throughout the peripheral lungs bilaterally, new. IMPRESSION: New hazy patchy opacities throughout the peripheral lungs bilaterally, suspicious for pneumonia. Chest radiograph follow-up advised. Electronically Signed   By: Ilona Sorrel M.D.   On: 09/18/2019 12:09    ____________________________________________  PROCEDURES   Procedure(s) performed (including Critical Care):  .Critical Care Performed by: Duffy Bruce, MD Authorized by: Duffy Bruce, MD   Critical care provider statement:    Critical care time (minutes):  35   Critical care time was exclusive of:  Separately billable procedures and treating other patients and teaching time   Critical care was necessary to treat  or prevent imminent or life-threatening deterioration of the following conditions:  Cardiac failure, circulatory failure and sepsis   Critical care was time spent personally by me on the following activities:  Development of treatment plan with patient or surrogate, discussions with consultants, evaluation of patient's response to treatment, examination of patient, obtaining history from patient or surrogate, ordering and performing treatments and interventions, ordering and review of laboratory studies, ordering and review of radiographic studies, pulse oximetry, re-evaluation of patient's condition and review of old charts   I assumed direction of critical care for this patient from another provider in my specialty: no   .1-3 Lead EKG Interpretation Performed by: Duffy Bruce, MD Authorized by: Duffy Bruce, MD     Interpretation: normal     ECG rate:  90-100s   ECG rate assessment: normal     Rhythm: sinus rhythm     Ectopy: none     Conduction: normal   Comments:     Indication: Resp failure in setting of COVID, cardiac history    ____________________________________________  INITIAL IMPRESSION / MDM / ASSESSMENT AND PLAN / ED COURSE  As part of my medical decision making, I reviewed the following data within the [place] notes reviewed and incorporated, Old chart reviewed, Notes from prior ED visits, and [place] Controlled Substance Database       *Timothy [name] was evaluated in Emergency Department  on 09/18/2019 for the symptoms described in the history of present illness. He was evaluated in the context of the global COVID-19 pandemic, which necessitated consideration that the patient might be at risk for infection with the SARS-CoV-2 virus that causes COVID-19. Institutional protocols and algorithms that pertain to the evaluation of patients at risk for COVID-19 are in a state of rapid change based on information released by regulatory bodies including the  CDC and federal and state organizations. These policies and algorithms were followed during the patient's care in the ED.  Some ED evaluations and interventions may be delayed as a result of limited staffing during the pandemic.*     Medical Decision Making:  86 yo M here with fever, SOB, mild hypoxia 2/2 multifocal PNA. Activated as code sepsis on arrival but LA normal, no hypotension, and given h/o CHF do not feel 30 cc/kg indicated. Will give cautious fluids, admit for IV ABX and oxygen. S/p COVID vaccination but will check antigen.  COVID positive. Will discuss with ID re: genetic testing. Admit to medicine.  ____________________________________________  FINAL CLINICAL IMPRESSION(S) / ED DIAGNOSES  Final diagnoses:  Acute respiratory failure with hypoxia (HCC)  COVID-19     MEDICATIONS GIVEN DURING THIS VISIT:  Medications  remdesivir 200 mg in sodium chloride 0.9% 250 mL IVPB (200 mg Intravenous New Bag/Given 09/18/19 1733)    Followed by  remdesivir 100 mg in sodium chloride 0.9 % 100 mL IVPB (has no administration in time range)  Ipratropium-Albuterol (COMBIVENT) respimat 1 puff (has no administration in time range)  dexamethasone (DECADRON) tablet 6 mg (has no administration in time range)  guaiFENesin-dextromethorphan (ROBITUSSIN DM) 100-10 MG/5ML syrup 10 mL (has no administration in time range)  chlorpheniramine-HYDROcodone (TUSSIONEX) 10-8 MG/5ML suspension 5 mL (has no administration in time range)  linagliptin (TRADJENTA) tablet 5 mg (has no administration in time range)  insulin detemir (LEVEMIR) injection 8 Units (has no administration in time range)  insulin aspart (novoLOG) injection 0-15 Units (has no administration in time range)  insulin aspart (novoLOG) injection 4 Units (has no administration in time range)  insulin aspart (novoLOG) injection 0-5 Units (has no administration in time range)  acetaminophen (TYLENOL) tablet 650 mg (has no administration in time  range)  polyethylene glycol (MIRALAX / GLYCOLAX) packet 17 g (has no administration in time range)  ondansetron (ZOFRAN) tablet 4 mg (has no administration in time range)    Or  ondansetron (ZOFRAN) injection 4 mg (has no administration in time range)  apixaban (ELIQUIS) tablet 5 mg (has no administration in time range)  calcium citrate-vitamin D (CITRACAL+D) 315-200 MG-UNIT per tablet 2 tablet (has no administration in time range)  carbidopa-levodopa (SINEMET IR) 25-100 MG per tablet immediate release 1 tablet (has no administration in time range)  flecainide (TAMBOCOR) tablet 50 mg (has no administration in time range)  metoprolol succinate (TOPROL-XL) 24 hr tablet 50 mg (has no administration in time range)  pantoprazole (PROTONIX) EC tablet 40 mg (has no administration in time range)  rosuvastatin (CRESTOR) tablet 10 mg (has no administration in time range)  ascorbic acid (VITAMIN C) tablet 500 mg (has no administration in time range)  zinc sulfate capsule 220 mg (has no administration in time range)  cefTRIAXone (ROCEPHIN) 2 g in sodium chloride 0.9 % 100 mL IVPB (0 g Intravenous Stopped 09/18/19 1348)  azithromycin (ZITHROMAX) 500 mg in sodium chloride 0.9 % 250 mL IVPB (0 mg Intravenous Stopped 09/18/19 1348)  sodium chloride 0.9 % bolus 500 mL (0  mLs Intravenous Stopped 09/18/19 1348)  dexamethasone (DECADRON) injection 6 mg (6 mg Intravenous Given 09/18/19 1349)     ED Discharge Orders    None       Note:  This document was prepared using Dragon voice recognition software and may include unintentional dictation errors.   Duffy Bruce, MD 09/18/19 1800    Duffy Bruce, MD 09/18/19 1801

## 2019-09-18 NOTE — Consult Note (Signed)
CODE SEPSIS - PHARMACY COMMUNICATION  **Broad Spectrum Antibiotics should be administered within 1 hour of Sepsis diagnosis**  Time Code Sepsis Called/Page Received: 1124  Antibiotics Ordered: CTX + azithromycin  Time of 1st antibiotic administration: 1224  Additional action taken by pharmacy: [place] Resident 09/18/2019  11:54 AM

## 2019-09-18 NOTE — H&P (Signed)
History and Physical    Timothy [name] [id number] DOB: 1929/03/18 DOA: 09/18/2019  PCP: Guadalupe Maple, MD   Patient coming from: Home  I have personally briefly reviewed patient's old medical records in [place]  Chief Complaint: Fever, chills, cough and shortness of breath.  HPI: Timothy [name] is a 86 y.o. male with medical history significant of hypertension, type 2 diabetes, paroxysmal A. fib on Eliquis, HFpEF, PAD and Parkinson's came to ED with complaint of fever, chills, coughing and exertional dyspnea for about a week now.  Patient recently went on the beach and return on last Friday.  Since Monday he developed fever and chills along with some cough.  He was seen at an urgent care and was given some antiallergic's.  Patient completed his Covid vaccine with Pfizer about a month ago.  He lives with his wife and a caregiver regularly visits them, both of them with similar symptoms at home.  He denies any nausea, vomiting or diarrhea.  No urinary symptoms.  He was complaining of generalized malaise and weakness to the point that he was unable to get out of bed.  No chest pain, orthopnea or PND.  ED Course: On arrival he was febrile at 100.1, tachypneic, desaturating to high 80s with minimum exertion, saturation improved to mid 90s on 2 L.  Labs positive for COVID-19 infection.  Chest x-ray with bilateral infiltrate consistent with COVID-19 pneumonia.  Review of Systems: As per HPI otherwise 10 point review of systems negative.   Past Medical History:  Diagnosis Date  . (HFpEF) heart failure with preserved ejection fraction ([place])    a. 05/2019 Echo: EF 65-70%, mod LVH. Gr2 DD. Mod AS.  Marland Kitchen AAA (abdominal aortic aneurysm) ([place])    a. 2014 Abd U/S: 3.5cm AAA; b. 10/20189 Abd U/S: 3.3cm AAA; c. 01/2018 CT Abd/Pelvis: 2.7cm infrarenal AAA - rec f/u U/S in 5 yrs.  . Back pain   . Esophageal reflux   . Essential hypertension   . History of stress test    a. 01/2012 MV: EF 63%, no  rwma. No ischemia/infarct. Low risk.  . Mixed hyperlipidemia   . Moderate aortic stenosis    a. 05/2019 Echo: Mod AS. Mean grad 29.5 mmHg. Peak grad 55.7 mmHg. AVA 1.28 cm^2 (VTI). No significant change from 2019 echo.  . 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 PAD (peripheral artery disease) ([place])    a. 12/2012 and 05/2013 s/p bilat LE bypass @ [facility] in setting of bilat popliteal aneurysms; b. 05/2019 LE Duplex: R PT 50-74%, Nl L Leg.  . Parkinson's disease ([place])   . Persistent atrial fibrillation ([place])    a. 02/2015 Event monitor: long periods of Afib--prev on flecainide; b. CHA2DS2VASc = 8-->Eliquis 5 bid.  . Senile osteoporosis   . Skin cancer   . Swelling of right lower extremity    a. 05/2019 LE U/S: No DVT. 50-64% R PT artery stenosis.  Marland Kitchen TIA (transient ischemic attack)   . Type II diabetes mellitus ([place])     Past Surgical History:  Procedure Laterality Date  . APPENDECTOMY    . BACK SURGERY    . CATARACT EXTRACTION    . COLONOSCOPY  [id number]  . ESOPHAGEAL DILATION    . GALLBLADDER SURGERY    . HERNIA REPAIR    . RETINAL DETACHMENT SURGERY    . TONSILLECTOMY       reports that he has never smoked. He  has never used smokeless tobacco. He reports that he does not drink alcohol or use drugs.  No Known Allergies  Family History  Problem Relation Age of Onset  . Heart disease Mother   . Diabetes Father   . Diabetes Other        grandparents  . Heart disease Brother   . Stroke Brother     Prior to Admission medications   Medication Sig Start Date End Date Taking? Authorizing Provider  apixaban (ELIQUIS) 5 MG TABS tablet Take 1 tablet (5 mg total) by mouth 2 (two) times daily. 07/25/19  Yes Cannady, Jolene T, NP  Calcium Citrate-Vitamin D (CALCIUM + D PO) Take 1 tablet by mouth daily.    Yes [provider]  carbidopa-levodopa (SINEMET) 25-100 MG tablet Take 1 tablet by mouth 4 (four) times daily.    Yes [provider]    dapagliflozin propanediol (FARXIGA) 10 MG TABS tablet Take 10 mg by mouth daily. 12/16/18  Yes Crissman, Jeannette How, MD  glipiZIDE-metformin (METAGLIP) 5-500 MG tablet Take 1 tablet by mouth 2 (two) times daily. 12/16/18  Yes Crissman, Jeannette How, MD  pantoprazole (PROTONIX) 40 MG tablet Take 1 tablet (40 mg total) by mouth 2 (two) times daily. 12/16/18  Yes Crissman, Jeannette How, MD  rosuvastatin (CRESTOR) 10 MG tablet Take 1 tablet (10 mg total) by mouth daily. 12/16/18  Yes Crissman, Jeannette How, MD  acetaminophen (TYLENOL) 500 MG tablet Take 1,000 mg by mouth every 8 (eight) hours as needed for mild pain or moderate pain.     [provider]  benzonatate (TESSALON) 200 MG capsule Take 1 capsule (200 mg total) by mouth 3 (three) times daily as needed. 09/14/19   Norval Gable, MD  cyanocobalamin (,VITAMIN B-12,) 1000 MCG/ML injection INJECT 1 ML INTO THE MUSCLE EVERY 30 DAYS 12/16/18   Guadalupe Maple, MD  flecainide (TAMBOCOR) 50 MG tablet Take 50 mg by mouth 2 (two) times daily. 08/26/19   [provider]  metoprolol succinate (TOPROL-XL) 50 MG 24 hr tablet Take 50 mg by mouth daily. 08/26/19   [provider]  Multiple Vitamins-Minerals (CEROVITE SENIOR PO) Take 1 tablet by mouth daily.    [provider]  Probiotic Product ([place]) Take 1 capsule by mouth daily.     [provider]  sertraline (ZOLOFT) 50 MG tablet Take 1 tablet (50 mg total) by mouth at bedtime. 12/16/18   Guadalupe Maple, MD  sitaGLIPtin (JANUVIA) 100 MG tablet Take 1 tablet (100 mg total) by mouth daily. 12/16/18   Guadalupe Maple, MD  UNABLE TO FIND Take 100 mg by mouth daily. CBD 100mg     [provider]    Physical Exam: Vitals:   09/18/19 1200 09/18/19 1230 09/18/19 1300 09/18/19 1330  BP: 130/75 133/68 118/69 (!) 144/74  Pulse: 81 82 81   Resp: (!) 21 (!) 27 (!) 28 (!) 27  Temp:      TempSrc:      SpO2: 96% 95% 96%   Weight:      Height:        General:  Vital signs reviewed.  Patient is well-developed and well-nourished, in no acute distress and cooperative with exam.  Head: Normocephalic and atraumatic. Eyes: EOMI, conjunctivae normal, no scleral icterus.  ENMT: Mucous membranes are moist. Neck: Supple, trachea midline, normal ROM, no JVD, masses, thyromegaly, or carotid bruit present.  Cardiovascular: RRR, S1 normal, S2 normal,  murmurs, no gallops, or rubs.  Pulmonary/Chest: Bilateral scattered crackles and some wheeze Abdominal: Soft, non-tender, non-distended, BS +, abdominal wall hernia. Extremities: No lower extremity edema bilaterally,  pulses symmetric and intact bilaterally. No cyanosis or clubbing. Neurological: A&O x3, Strength is normal and symmetric bilaterally, cranial nerve II-XII are grossly intact, no focal motor deficit, sensory intact to light touch bilaterally.  Skin: Warm, dry and intact. No rashes or erythema. Psychiatric: Normal mood and affect. speech and behavior is normal. Cognition and memory are normal.   Labs on Admission: I have personally reviewed following labs and imaging studies  CBC: Recent Labs  Lab 09/18/19 1052  WBC 9.8  NEUTROABS 7.2  HGB 10.2*  HCT 32.4*  MCV 89.0  PLT [id number]   Basic Metabolic Panel: Recent Labs  Lab 09/18/19 1052  NA 134*  K 3.7  CL 101  CO2 21*  GLUCOSE 150*  BUN 17  CREATININE 1.23  CALCIUM 8.2*   GFR: Estimated Creatinine Clearance: 41.5 mL/min (by C-G formula based on SCr of 1.23 mg/dL). Liver Function Tests: Recent Labs  Lab 09/18/19 1052  AST 39  ALT 24  ALKPHOS 92  BILITOT 0.9  PROT 7.1  ALBUMIN 3.4*   No results for input(s): LIPASE, AMYLASE in the last 168 hours. No results for input(s): AMMONIA in the last 168 hours. Coagulation Profile: Recent Labs  Lab 09/18/19 1052  INR 1.1   Cardiac Enzymes: No results for input(s): CKTOTAL, CKMB, CKMBINDEX, TROPONINI in the last 168 hours. BNP (last 3 results) No results for input(s): PROBNP in the  last 8760 hours. HbA1C: No results for input(s): HGBA1C in the last 72 hours. CBG: No results for input(s): GLUCAP in the last 168 hours. Lipid Profile: No results for input(s): CHOL, HDL, LDLCALC, TRIG, CHOLHDL, LDLDIRECT in the last 72 hours. Thyroid Function Tests: No results for input(s): TSH, T4TOTAL, FREET4, T3FREE, THYROIDAB in the last 72 hours. Anemia Panel: No results for input(s): VITAMINB12, FOLATE, FERRITIN, TIBC, IRON, RETICCTPCT in the last 72 hours. Urine analysis:    Component Value Date/Time   COLORURINE YELLOW (A) 09/18/2019 1158   APPEARANCEUR CLEAR (A) 09/18/2019 1158   APPEARANCEUR Clear 12/10/2017 1557   LABSPEC 1.024 09/18/2019 1158   PHURINE 5.0 09/18/2019 1158   GLUCOSEU >=500 (A) 09/18/2019 1158   HGBUR SMALL (A) 09/18/2019 1158   BILIRUBINUR NEGATIVE 09/18/2019 1158   BILIRUBINUR Negative 12/10/2017 1557   KETONESUR 20 (A) 09/18/2019 1158   PROTEINUR 30 (A) 09/18/2019 1158   NITRITE NEGATIVE 09/18/2019 1158   LEUKOCYTESUR NEGATIVE 09/18/2019 1158    Radiological Exams on Admission: DG Chest Port 1 View  Result Date: 09/18/2019 CLINICAL DATA:  Dyspnea EXAM: PORTABLE CHEST 1 VIEW COMPARISON:  05/04/2018 chest radiograph. FINDINGS: Stable cardiomediastinal silhouette with normal heart size. No pneumothorax. No pleural effusion. Hazy patchy opacities throughout the peripheral lungs bilaterally, new. IMPRESSION: New hazy patchy opacities throughout the peripheral lungs bilaterally, suspicious for pneumonia. Chest radiograph follow-up advised. Electronically Signed   By: Ilona Sorrel M.D.   On: 09/18/2019 12:09    EKG: Independently reviewed.  Sinus rhythm with incomplete right right bundle branch block.  Assessment/Plan Active Problems:   Pneumonia due to COVID-19 virus   COVID-19 pneumonia.  Patient symptoms, labs and chest x-ray consistent with COVID-19 pneumonia.  Patient did completed his vaccination with Pfizer approximately 1 month ago.  Recent  visit to beach where they were not using masks due to vaccination. -Check inflammatory markers. -Check procalcitonin -Start him on remdesivir and Decadron. -Oxygen supplement to maintain saturation  above 90%. -Combivent inhaler. -Supportive care for cough. -Vitamin C and zinc supplement. -Patient was given azithromycin and ceftriaxone in ED for CAD.  Antibiotics can be continued if procalcitonin is positive.  Hypertension.  Blood pressure within goal. -Continue home meds.  Paroxysmal A. fib.  Clean sinus rhythm. -Continue home dose of metoprolol, flecainide and Eliquis.  Parkinson's.  Continue home dose of Sinemet.  Type 2 diabetes.  On Farxiga, Januvia and Metformin at home. -Check A1c -Give him Tradjenta due to Covid positive. -Start him on Levemir and SSI.  HFrEF.  Echocardiogram done in November 2021 with normal EF and grade 2 diastolic dysfunction.  Currently well compensated.  Patient appears euvolemic.  Not on any diuretic at home. -Continue to monitor   DVT prophylaxis: Eliquis Code Status: Full code Family Communication: Daughter was updated on phone Disposition Plan: Pending improvement and completion of remdesivir. Consults called: None Admission status: Inpatient   Lorella Nimrod MD [facility]  If 7PM-7AM, please contact night-coverage [email or web address]  09/18/2019, 3:19 PM   This record has been created using Systems analyst. Errors have been sought and corrected,but may not always be located. Such creation errors do not reflect on the standard of care.

## 2019-09-18 NOTE — Consult Note (Signed)
Remdesivir - Pharmacy Brief Note   O:  ALT: 24 CXR: Suspicious for PNA SpO2: Hypoxic requiring supplemental O2   A/P:  3/14 POC COVID Ag +  Remdesivir 200 mg IVPB once followed by 100 mg IVPB daily x 4 days.   [place] Resident 09/18/2019 2:35 PM

## 2019-09-18 NOTE — ED Notes (Addendum)
Attempted to call patients spouse. She did not answer any of the listed numbers. Pt's son Berneta Sages was contacted and given update on patients status. He relayed that his father's spouse is not the mother of any of his children and that there is an estrangement between the patient and his children due to the spouse Bonnita Nasuti. He cautioned this nurse about mentioning Bonnita Nasuti to any of the patients children. He also relayed that the patients spouse possibly had dementia and would not answer any calls from numbers she did not recognize. He gave me the number of the patients care giver Jenny Reichmann [phone] and stated she would be the best person to contact to reach out to [place]. Jenny Reichmann contacted and stated she would reach out to [facility] and ask her to answer calls so her husband could communicate with her.

## 2019-09-18 NOTE — ED Triage Notes (Addendum)
Pt via EMS from home. Pt has not been feeling good for the last 6 days. Pt c/o fever, chills, weakness, and cough. Pt was seen at his doctors office 3 days ago where they prescribed him allergy medication. Per EMS, temp was 100.4 and pt O2 sat dropped to 88% on RA when pt was coughing. Pt placed on 2L [place]. On arrival, pt A&Ox4 and NAD at this time.

## 2019-09-18 NOTE — ED Notes (Signed)
Pt given a cup of water upon request, no distress noted,cont to monitor

## 2019-09-18 NOTE — ED Notes (Signed)
Pt given meal tray at this time. Attempting to eat at this time.

## 2019-09-18 NOTE — ED Notes (Signed)
Pt presents to the ED for weakness, fever, chills, and cough for the past 6 days. Pt was seen at Urgent Care where they prescribed him allergy medicine. Per EMS, pt O2 sat dropped to 89% on RA while he was coughing, placed on 2L [place] and now sat 94%. Pt recently had his COVID vaccine 4 weeks ago. On assessment, pt is tachypneic and has a temp on 100.1 orally. Pt is A&Ox4 and NAD at this time.

## 2019-09-19 DIAGNOSIS — J9601 Acute respiratory failure with hypoxia: Secondary | ICD-10-CM

## 2019-09-19 LAB — GLUCOSE, CAPILLARY
Glucose-Capillary: 214 mg/dL — ABNORMAL HIGH (ref 70–99)
Glucose-Capillary: 175 mg/dL — ABNORMAL HIGH (ref 70–99)
Glucose-Capillary: 264 mg/dL — ABNORMAL HIGH (ref 70–99)
Glucose-Capillary: 156 mg/dL — ABNORMAL HIGH (ref 70–99)
Glucose-Capillary: 212 mg/dL — ABNORMAL HIGH (ref 70–99)

## 2019-09-19 LAB — C-REACTIVE PROTEIN: CRP: 20.1 mg/dL — ABNORMAL HIGH (ref <1.0)

## 2019-09-19 LAB — COMPREHENSIVE METABOLIC PANEL
ALT: 8 U/L (ref 0–44)
AST: 35 U/L (ref 15–41)
Albumin: 3.3 g/dL — ABNORMAL LOW (ref 3.5–5.0)
Alkaline Phosphatase: 92 U/L (ref 38–126)
Anion gap: 15 (ref 5–15)
BUN: 24 mg/dL — ABNORMAL HIGH (ref 8–23)
CO2: 18 mmol/L — ABNORMAL LOW (ref 22–32)
Calcium: 8.2 mg/dL — ABNORMAL LOW (ref 8.9–10.3)
Chloride: 102 mmol/L (ref 98–111)
Creatinine, Ser: 1.07 mg/dL (ref 0.61–1.24)
GFR calc Af Amer: >60 mL/min (ref >60)
GFR calc non Af Amer: >60 mL/min (ref >60)
Glucose, Bld: 180 mg/dL — ABNORMAL HIGH (ref 70–99)
Potassium: 3.8 mmol/L (ref 3.5–5.1)
Sodium: 135 mmol/L (ref 135–145)
Total Bilirubin: 0.9 mg/dL (ref 0.3–1.2)
Total Protein: 6.9 g/dL (ref 6.5–8.1)

## 2019-09-19 LAB — CBC WITH DIFFERENTIAL/PLATELET
Abs Immature Granulocytes: 0.1 10*3/uL — ABNORMAL HIGH (ref 0.00–0.07)
Basophils Absolute: 0 10*3/uL (ref 0.0–0.1)
Basophils Relative: 0 %
Eosinophils Absolute: 0 10*3/uL (ref 0.0–0.5)
Eosinophils Relative: 0 %
HCT: 33.8 % — ABNORMAL LOW (ref 39.0–52.0)
Hemoglobin: 10.6 g/dL — ABNORMAL LOW (ref 13.0–17.0)
Immature Granulocytes: 1 %
Lymphocytes Relative: 7 %
Lymphs Abs: 0.6 10*3/uL — ABNORMAL LOW (ref 0.7–4.0)
MCH: 27.5 pg (ref 26.0–34.0)
MCHC: 31.4 g/dL (ref 30.0–36.0)
MCV: 87.8 fL (ref 80.0–100.0)
Monocytes Absolute: 0.7 10*3/uL (ref 0.1–1.0)
Monocytes Relative: 9 %
Neutro Abs: 6.6 10*3/uL (ref 1.7–7.7)
Neutrophils Relative %: 83 %
Platelets: 240 10*3/uL (ref 150–400)
RBC: 3.85 MIL/uL — ABNORMAL LOW (ref 4.22–5.81)
RDW: 15.2 % (ref 11.5–15.5)
WBC: 8.1 10*3/uL (ref 4.0–10.5)
nRBC: 0 % (ref 0.0–0.2)

## 2019-09-19 LAB — FIBRIN DERIVATIVES D-DIMER (ARMC ONLY): Fibrin derivatives D-dimer (ARMC): 2863.8 ng/mL (FEU) — ABNORMAL HIGH (ref 0.00–499.00)

## 2019-09-19 LAB — PHOSPHORUS: Phosphorus: 3.4 mg/dL (ref 2.5–4.6)

## 2019-09-19 LAB — FERRITIN: Ferritin: 122 ng/mL (ref 24–336)

## 2019-09-19 LAB — MAGNESIUM: Magnesium: 2.2 mg/dL (ref 1.7–2.4)

## 2019-09-19 MED ORDER — INSULIN DETEMIR 100 UNIT/ML ~~LOC~~ SOLN
15.0000 [IU] | Freq: Every day | SUBCUTANEOUS | Status: DC
  Administered 2019-09-19 – 2019-09-22 (×4): 15 [IU] via SUBCUTANEOUS
  Filled 2019-09-19 (×5): qty 0.15

## 2019-09-19 MED ORDER — INSULIN ASPART 100 UNIT/ML ~~LOC~~ SOLN
8.0000 [IU] | Freq: Three times a day (TID) | SUBCUTANEOUS | Status: DC
  Filled 2019-09-19: qty 1

## 2019-09-19 NOTE — Progress Notes (Signed)
[facility] [place] NAME: Timothy [name]    MR#:  [id number]  DATE OF BIRTH:  [birth year]  SUBJECTIVE:  patient came in with increasing cough fever and chills for a week. Recently went on a beach. Got his Pfizer vaccination last month. Tolerating PO diet. Sats are 92-97% on 2 L nasal cannula oxygen no respiratory distress,no fever  REVIEW OF SYSTEMS:   Review of Systems  Constitutional: Negative for chills, fever and weight loss.  HENT: Negative for ear discharge, ear pain and nosebleeds.   Eyes: Negative for blurred vision, pain and discharge.  Respiratory: Positive for shortness of breath. Negative for sputum production, wheezing and stridor.   Cardiovascular: Negative for chest pain, palpitations, orthopnea and PND.  Gastrointestinal: Negative for abdominal pain, diarrhea, nausea and vomiting.  Genitourinary: Negative for frequency and urgency.  Musculoskeletal: Negative for back pain and joint pain.  Neurological: Positive for weakness. Negative for sensory change, speech change and focal weakness.  Psychiatric/Behavioral: Negative for depression and hallucinations. The patient is not nervous/anxious.    Tolerating Diet:yes Tolerating PT: pending  DRUG ALLERGIES:  No Known Allergies  VITALS:  Blood pressure (!) 143/95, pulse 80, temperature 98.9 F (37.2 C), temperature source Axillary, resp. rate (!) 23, height 5\' 9"  (1.753 m), weight 78 kg, SpO2 92 %.  PHYSICAL EXAMINATION:   Physical Exam  GENERAL:  86 y.o.-year-old patient lying in the bed with no acute distress.  EYES: Pupils equal, round, reactive to light and accommodation. No scleral icterus.   HEENT: Head atraumatic, normocephalic. Oropharynx and nasopharynx clear.  NECK:  Supple, no jugular venous distention. No thyroid enlargement, no tenderness.  LUNGS: decreased breath sounds bilaterally bases , no wheezing, rales, rhonchi. No use of accessory muscles of respiration.   CARDIOVASCULAR: S1, S2 normal. No murmurs, rubs, or gallops.  ABDOMEN: Soft, nontender, nondistended. Bowel sounds present. No organomegaly or mass.  EXTREMITIES: No cyanosis, clubbing or edema b/l.    NEUROLOGIC: Cranial nerves II through XII are intact. No focal Motor or sensory deficits b/l.  Mild tremor+ PSYCHIATRIC:  patient is alert and oriented x 3.  SKIN: No obvious rash, lesion, or ulcer.   LABORATORY PANEL:  CBC Recent Labs  Lab 09/19/19 0602  WBC 8.1  HGB 10.6*  HCT 33.8*  PLT 240    Chemistries  Recent Labs  Lab 09/19/19 0602  NA 135  K 3.8  CL 102  CO2 18*  GLUCOSE 180*  BUN 24*  CREATININE 1.07  CALCIUM 8.2*  MG 2.2  AST 35  ALT 8  ALKPHOS 92  BILITOT 0.9   Cardiac Enzymes No results for input(s): TROPONINI in the last 168 hours. RADIOLOGY:  DG Chest Port 1 View  Result Date: 09/18/2019 CLINICAL DATA:  Dyspnea EXAM: PORTABLE CHEST 1 VIEW COMPARISON:  05/04/2018 chest radiograph. FINDINGS: Stable cardiomediastinal silhouette with normal heart size. No pneumothorax. No pleural effusion. Hazy patchy opacities throughout the peripheral lungs bilaterally, new. IMPRESSION: New hazy patchy opacities throughout the peripheral lungs bilaterally, suspicious for pneumonia. Chest radiograph follow-up advised. Electronically Signed   By: Ilona Sorrel M.D.   On: 09/18/2019 12:09   ASSESSMENT AND PLAN:  Timothy [name] is a 86 y.o. male with medical history significant of hypertension, type 2 diabetes, paroxysmal A. fib on Eliquis, HFpEF, PAD and Parkinson's came to ED with complaint of fever, chills, coughing and exertional dyspnea for about a week now.  Patient recently went on the beach and return  on last Friday.  Since Monday he developed fever and chills along with some cough. Patient completed his Covid vaccine with Pfizer about a month ago.  Acute hypoxic respiratory failure secondary to COVID-19 pneumonia.   -Patient symptoms, labs and chest x-ray consistent with  COVID-19 pneumonia.  - Patient did completehis vaccination with Pfizer approximately 1 month ago.  Recent visit to beach where they were not using masks due to vaccination. -CRP 21.7--20.1 - procalcitonin 0.14 - on IV remdesivir (day 2/5) and Decadron. -Oxygen supplement to maintain saturation above 90%. -Combivent inhaler. -Supportive care for cough. -Vitamin C and zinc supplement. -Patient was given azithromycin and ceftriaxone in ED for CAD.   No indication for antibiotics at present  Hypertension.  Blood pressure within goal. -Continue metoprolol  Paroxysmal A. fib.   currently in sinus rhythm. -Continue home dose of metoprolol, flecainide and Eliquis.  Chronic Parkinson's.  Continue home dose of Sinemet. -Get physical therapy consultation. -Patient is independent at baseline. He uses walker. No recent falls. He had a recent trip to the beach also.  Type 2 diabetes.  On Farxiga, Januvia and Metformin at home. -Give him Tradjenta due to Covid positive. -Start him on Levemir and SSI.  HFrEF.  Echocardiogram done in November 2021 with normal EF and grade 2 diastolic dysfunction.  Currently well compensated.  Patient appears euvolemic.  Not on any diuretic at home. -Continue to monitor   DVT prophylaxis: Eliquis Code Status: Full code Family Communication: Son Davie Scarpulla was informed [phone] Disposition Plan: Pending improvement and completion of remdesivir. Consults called: None   TOTAL TIME TAKING CARE OF THIS PATIENT: *68minutes.  >50% time spent on counselling and coordination of care  Note: This dictation was prepared with Dragon dictation along with smaller phrase technology. Any transcriptional errors that result from this process are unintentional.  Fritzi Mandes M.D    [facility]   CC: Primary care physician; Guadalupe Maple, MDPatient ID: Timothy [name], male   DOB: [birth year], 86 y.o.   MRN: [id number]

## 2019-09-19 NOTE — TOC Initial Note (Addendum)
Transition of Care [facility]) - Initial/Assessment Note    Patient Details  Name: Timothy [name] MRN: [id number] Date of Birth: October 01, 1928  [facility]) CM/SW Contact:    Elease Hashimoto, LCSW Phone Number: 09/19/2019, 12:20 PM  Clinical Narrative: Spoke with Cindy-pt and wife's caregiver, along with pt. Cindy-caregiver has tested positive and is quarantined until 3/23. Pt's wife is being tested today, son is taking her to be tested. Pt and wife at this time doe not have a caregiver. Have left message for Berneta Sages to discuss the plan for pt and his wife. Wife has dementia and is alone at home right now. Will await PT eval to see how mobile pt is and what level of care he will require. Will work on safe discharge plan.                 2:23 pm-Spoke with Gerald-pt's son who is on board with going to SNF at [place], can not go home with wife she can not assist him.  Expected Discharge Plan: [place] Barriers to Discharge: Continued Medical Work up, Other (comment)(pt's caregiver has COVID and quantined until 3/23.)   Patient Goals and CMS Choice Patient states their goals for this hospitalization and ongoing recovery are:: I hope to go home, but wife is being tested today and Cindy-caregiver is positive also      Expected Discharge Plan and Services Expected Discharge Plan: [place] In-house Referral: Clinical Social Work     Living arrangements for the past 2 months: Single Family Home                                      Prior Living Arrangements/Services Living arrangements for the past 2 months: Single Family Home Lives with:: Spouse          Need for Family Participation in Patient Care: Yes (Comment) Care giver support system in place?: No (comment)      Activities of Daily Living Home Assistive Devices/Equipment: Eyeglasses, Environmental consultant (specify type) ADL Screening (condition at time of admission) Patient's cognitive ability  adequate to safely complete daily activities?: Yes Is the patient deaf or have difficulty hearing?: No Does the patient have difficulty seeing, even when wearing glasses/contacts?: No Does the patient have difficulty concentrating, remembering, or making decisions?: No Patient able to express need for assistance with ADLs?: Yes Does the patient have difficulty dressing or bathing?: Yes Independently performs ADLs?: Yes (appropriate for developmental age) Does the patient have difficulty walking or climbing stairs?: Yes Weakness of Legs: Both Weakness of Arms/Hands: Both  Permission Sought/Granted   Permission granted to share information with : Yes, Verbal Permission Granted  Share Information with NAME: Berneta Sages     Permission granted to share info w Relationship: son     Emotional Assessment Appearance:: Appears stated age Attitude/Demeanor/Rapport: Engaged Affect (typically observed): Adaptable, Accepting Orientation: : Oriented to Self, Oriented to Place, Oriented to  Time, Oriented to Situation      Admission diagnosis:  Acute respiratory failure with hypoxia (HCC) [J96.01] Pneumonia due to COVID-19 virus [U07.1, J12.82] COVID-19 [U07.1] Patient Active Problem List   Diagnosis Date Noted  . Pneumonia due to COVID-19 virus 09/18/2019  . PAD (peripheral artery disease) ([place])   . Moderate aortic stenosis   . AAA (abdominal aortic aneurysm) ([place])   . (HFpEF) heart failure with preserved ejection fraction (  [place])   . Type 2 diabetes mellitus without complication, without long-term current use of insulin ([place])   . Parkinson disease ([place]) 06/14/2018  . Behavioral change 03/25/2018  . Aortic valve stenosis 02/18/2018  . Dysphagia 12/10/2017  . Advanced care planning/counseling discussion 11/27/2016  . Pernicious anemia 05/28/2015  . Leg weakness, bilateral 04/12/2015  . Tremor 03/27/2015  . Chronic anxiety 02/20/2015  . SVT (supraventricular tachycardia) ([place]) 02/16/2015  .  AF (paroxysmal atrial fibrillation) ([place]) 02/16/2015  . History of back surgery 02/16/2015  . TIA (transient ischemic attack) 12/29/2011  . Hyperlipidemia associated with type 2 diabetes mellitus ([place]) 09/24/2011  . Essential hypertension 09/24/2011   PCP:  Guadalupe Maple, MD Pharmacy:   [facility], [place] [address] [place] [address] Phone: [phone] Fax: [phone]     Social Determinants of Health (SDOH) Interventions    Readmission Risk Interventions No flowsheet data found.

## 2019-09-19 NOTE — NC FL2 (Signed)
[place] LEVEL OF CARE SCREENING TOOL     IDENTIFICATION  Patient Name: Timothy [name] Birthdate: October 25, 1928 Sex: male Admission Date (Current Location): 09/18/2019  [place] and [place] Number:  Engineering geologist and Address:  [facility], [address], [place], [place] [address]      Provider Number: [id number]  Attending Physician Name and Address:  Fritzi Mandes, MD  Relative Name and Phone Number:  Darriel Mandia [phone]    Current Level of Care: Hospital Recommended Level of Care: [place] Prior Approval Number:    Date Approved/Denied:   PASRR Number: [id number]  Discharge Plan: SNF    Current Diagnoses: Patient Active Problem List   Diagnosis Date Noted  . Acute respiratory failure with hypoxia ([place])   . Pneumonia due to COVID-19 virus 09/18/2019  . PAD (peripheral artery disease) ([place])   . Moderate aortic stenosis   . AAA (abdominal aortic aneurysm) ([place])   . (HFpEF) heart failure with preserved ejection fraction ([place])   . Type 2 diabetes mellitus without complication, without long-term current use of insulin ([place])   . Parkinson disease ([place]) 06/14/2018  . Behavioral change 03/25/2018  . Aortic valve stenosis 02/18/2018  . Dysphagia 12/10/2017  . Advanced care planning/counseling discussion 11/27/2016  . Pernicious anemia 05/28/2015  . Leg weakness, bilateral 04/12/2015  . Tremor 03/27/2015  . Chronic anxiety 02/20/2015  . SVT (supraventricular tachycardia) ([place]) 02/16/2015  . AF (paroxysmal atrial fibrillation) ([place]) 02/16/2015  . History of back surgery 02/16/2015  . TIA (transient ischemic attack) 12/29/2011  . Hyperlipidemia associated with type 2 diabetes mellitus ([place]) 09/24/2011  . Essential hypertension 09/24/2011    Orientation RESPIRATION BLADDER Height & Weight     Self, Time, Situation, Place  O2(2 liters continuous) Continent Weight: 171 lb 15.3 oz (78 kg) Height:  5'  9" (175.3 cm)  BEHAVIORAL SYMPTOMS/MOOD NEUROLOGICAL BOWEL NUTRITION STATUS      Continent Diet(heart healthy thin liquids)  AMBULATORY STATUS COMMUNICATION OF NEEDS Skin   Extensive Assist Verbally Normal                       Personal Care Assistance Level of Assistance  Bathing, Dressing Bathing Assistance: Limited assistance   Dressing Assistance: Limited assistance     Functional Limitations Info  Sight Sight Info: Impaired(glasses)        SPECIAL CARE FACTORS FREQUENCY  PT (By licensed PT), OT (By licensed OT)     PT Frequency: 5x week OT Frequency: 5x week            Contractures Contractures Info: Not present    Additional Factors Info  Code Status, Allergies Code Status Info: Full Code Allergies Info: No known allergies           Current Medications (09/19/2019):  This is the current hospital active medication list Current Facility-Administered Medications  Medication Dose Route Frequency Provider Last Rate Last Admin  . acetaminophen (TYLENOL) tablet 650 mg  650 mg Oral Q6H PRN Lorella Nimrod, MD      . apixaban (ELIQUIS) tablet 5 mg  5 mg Oral BID Lorella Nimrod, MD   5 mg at 09/19/19 1000  . ascorbic acid (VITAMIN C) tablet 500 mg  500 mg Oral Daily Lorella Nimrod, MD   500 mg at 09/19/19 1000  . calcium-vitamin D (OSCAL WITH D) 500-200 MG-UNIT per tablet 2 tablet  2 tablet Oral Daily Lorella Nimrod, MD   2 tablet at  09/19/19 1000  . carbidopa-levodopa (SINEMET IR) 25-100 MG per tablet immediate release 1 tablet  1 tablet Oral QID Lorella Nimrod, MD   1 tablet at 09/19/19 1000  . chlorpheniramine-HYDROcodone (TUSSIONEX) 10-8 MG/5ML suspension 5 mL  5 mL Oral Q12H PRN Lorella Nimrod, MD      . dexamethasone (DECADRON) tablet 6 mg  6 mg Oral Q24H Lorella Nimrod, MD      . flecainide (TAMBOCOR) tablet 50 mg  50 mg Oral BID Lorella Nimrod, MD   50 mg at 09/19/19 1000  . guaiFENesin-dextromethorphan (ROBITUSSIN DM) 100-10 MG/5ML syrup 10 mL  10 mL Oral Q4H PRN  Lorella Nimrod, MD      . insulin aspart (novoLOG) injection 0-15 Units  0-15 Units Subcutaneous TID WC Lorella Nimrod, MD   8 Units at 09/19/19 1221  . insulin aspart (novoLOG) injection 0-5 Units  0-5 Units Subcutaneous QHS Lorella Nimrod, MD   2 Units at 09/19/19 0134  . insulin detemir (LEVEMIR) injection 15 Units  15 Units Subcutaneous Daily Fritzi Mandes, MD      . Ipratropium-Albuterol (COMBIVENT) respimat 1 puff  1 puff Inhalation Q6H Lorella Nimrod, MD   1 puff at 09/19/19 0800  . linagliptin (TRADJENTA) tablet 5 mg  5 mg Oral Daily Lorella Nimrod, MD      . metoprolol succinate (TOPROL-XL) 24 hr tablet 50 mg  50 mg Oral Daily Lorella Nimrod, MD   50 mg at 09/19/19 1000  . ondansetron (ZOFRAN) tablet 4 mg  4 mg Oral Q6H PRN Lorella Nimrod, MD       Or  . ondansetron (ZOFRAN) injection 4 mg  4 mg Intravenous Q6H PRN Lorella Nimrod, MD      . pantoprazole (PROTONIX) EC tablet 40 mg  40 mg Oral BID Lorella Nimrod, MD   40 mg at 09/19/19 1000  . polyethylene glycol (MIRALAX / GLYCOLAX) packet 17 g  17 g Oral Daily PRN Lorella Nimrod, MD      . remdesivir 100 mg in sodium chloride 0.9 % 100 mL IVPB  100 mg Intravenous Daily Lorella Nimrod, MD      . rosuvastatin (CRESTOR) tablet 10 mg  10 mg Oral Daily Lorella Nimrod, MD      . zinc sulfate capsule 220 mg  220 mg Oral Daily Lorella Nimrod, MD   220 mg at 09/19/19 1000     Discharge Medications: Please see discharge summary for a list of discharge medications.  Relevant Imaging Results:  Relevant Lab Results:   Additional Information SSN: [id number] new COVID 3/14  Casandra Dallaire, Gardiner Rhyme, LCSW

## 2019-09-19 NOTE — Evaluation (Signed)
Physical Therapy Evaluation Patient Details Name: Timothy [name] MRN: [id number] DOB: 1929-02-21 Today's Date: 09/19/2019   History of Present Illness  Timothy [name] is an 88yoM who comes to [facility] on 3/14 c fever, chills, SOB. Pt out of town to beach last week, ill since return. Pt is s/p Pfizer vaccine, 2nd dose about 1 month ago. Pt complaining of malaise to the point that he was unable to get OOB. In ED pt febrile, hypoxic, labs (+) COVID19 infection. PMH: hypertension, type 2 diabetes, paroxysmal A. fib on Eliquis, HFpEF, PAD and Parkinson's disease.  Clinical Impression  Pt admitted with above diagnosis. Pt currently with functional limitations due to the deficits listed below (see "PT Problem List"). Upon entry, pt in bed, awake and agreeable to participate. Noted negative pressure fan is turned to lowest setting upon entry, immediately corrected back to #5- RN unavailable, charge RN made aware. Pt reports he needs help getting his "things plugged back in"- he is noted to have doffed his nasal cannual, unplugged his room phone, unplugged his oximeter electrode. Pt denies any acute confusion or baseline cognitive deficits, but has several instances in session that demonstrate limited safety awareness, limited awareness to situation, and short term memory deficit. The pt is oriented x4, pleasant, conversational, and generally a fair historian. Pt requires Max A for bed mobility, ModA for transfers, struggles to obtain upright balance for nearly 1 minutes, but then tolerates sustained standing without fatigue. Pt able to take several small steps at EOB to center self with bed, but maintaining balance is quite difficult. Additional gait assessment unsafe at this time. Functional mobility assessment demonstrates increased effort/time requirements, poor tolerance, and need for physical assistance, whereas the patient performed these at a higher level of independence PTA. Pt has ~20 hours weekly of assistance  from in-home aide and has support from his wife, however, at present pt will require more support until back to baseline, unclear if wife is able to provide more heavy physical support. Pt will benefit from skilled PT intervention to increase independence and safety with basic mobility in preparation for discharge to the venue listed below.       Follow Up Recommendations SNF;Supervision for mobility/OOB;Supervision - Intermittent    Equipment Recommendations  None recommended by PT    Recommendations for Other Services       Precautions / Restrictions Precautions Precautions: Fall Precaution Comments: *seems somewhat confused, although he denies this. Restrictions Weight Bearing Restrictions: No      Mobility  Bed Mobility Overal bed mobility: Needs Assistance Bed Mobility: Supine to Sit;Sit to Supine     Supine to sit: Mod assist Sit to supine: Max assist   General bed mobility comments: weaker than baseline per patient  Transfers Overall transfer level: Needs assistance Equipment used: Rolling walker (2 wheeled) Transfers: Sit to/from Stand Sit to Stand: Mod assist         General transfer comment: ModA boost to standing, requires ~60seconds to establsih balance and postural control. Limited safety awareness and easily distracted by lines/leads at the detriment of his safety; desat to 88% on room air.  Ambulation/Gait   Gait Distance (Feet): (deferred due to safety concerns)            Stairs            Wheelchair Mobility    Modified Rankin (Stroke Patients Only)       Balance Overall balance assessment: Needs assistance Sitting-balance support: No upper extremity supported;Feet supported Sitting  balance-Leahy Scale: Good     Standing balance support: Bilateral upper extremity supported;During functional activity Standing balance-Leahy Scale: Poor                               Pertinent Vitals/Pain Pain Assessment: No/denies  pain    Home Living Family/patient expects to be discharged to:: Private residence Living Arrangements: Spouse/significant other Available Help at Discharge: Available 24 hours/day;Personal care attendant(wife, and aide in home 8-12p 5d/week) Type of Home: House Home Access: Stairs to enter Entrance Stairs-Rails: Right;Left;Can reach both Entrance Stairs-Number of Steps: 7 (last eval note said 5 steps) Home Layout: One level Home Equipment: Other (comment);Walker - 2 wheels;Wheelchair - manual Additional Comments: Typically uses a parkinson specific upright walker    Prior Function Level of Independence: Needs assistance   Gait / Transfers Assistance Needed: household AMB c up-walker           Hand Dominance        Extremity/Trunk Assessment   Upper Extremity Assessment Upper Extremity Assessment: Generalized weakness    Lower Extremity Assessment Lower Extremity Assessment: Generalized weakness       Communication      Cognition Arousal/Alertness: Awake/alert Behavior During Therapy: WFL for tasks assessed/performed;Flat affect Overall Cognitive Status: No family/caregiver present to determine baseline cognitive functioning Area of Impairment: Problem solving;Safety/judgement                         Safety/Judgement: Decreased awareness of safety;Decreased awareness of deficits   Problem Solving: Slow processing;Decreased initiation;Difficulty sequencing General Comments: Pt denies any acute mentation changes with illness, denies any baseline cognitive impairment. Reports his Parkinsonian delayed speech and slow speaking have been taken for cognitive impairment; however pt has clear moment of confusing session, disconnecting lines and leads, forgetting that he has a condom cath in place several times.      General Comments      Exercises     Assessment/Plan    PT Assessment Patient needs continued PT services  PT Problem List Decreased  strength;Decreased activity tolerance;Decreased balance;Decreased mobility;Decreased cognition;Decreased knowledge of precautions;Decreased safety awareness;Cardiopulmonary status limiting activity       PT Treatment Interventions DME instruction;Balance training;Gait training;Stair training;Functional mobility training;Therapeutic activities;Therapeutic exercise;Patient/family education    PT Goals (Current goals can be found in the Care Plan section)  Acute Rehab PT Goals Patient Stated Goal: regain strength, return to walking modI in home PT Goal Formulation: With patient Time For Goal Achievement: 10/03/19 Potential to Achieve Goals: Fair    Frequency Min 2X/week   Barriers to discharge Decreased caregiver support caregiver support is limited    Co-evaluation               AM-PAC PT "6 Clicks" Mobility  Outcome Measure Help needed turning from your back to your side while in a flat bed without using bedrails?: Total Help needed moving from lying on your back to sitting on the side of a flat bed without using bedrails?: Total Help needed moving to and from a bed to a chair (including a wheelchair)?: Total Help needed standing up from a chair using your arms (e.g., wheelchair or bedside chair)?: Total Help needed to walk in hospital room?: Total Help needed climbing 3-5 steps with a railing? : Total 6 Click Score: 6    End of Session Equipment Utilized During Treatment: Oxygen Activity Tolerance: Patient limited by fatigue;No increased pain;Patient tolerated treatment  well Patient left: in bed;with call bell/phone within reach;with bed alarm set Nurse Communication: Mobility status PT Visit Diagnosis: Unsteadiness on feet (R26.81);Difficulty in walking, not elsewhere classified (R26.2);Muscle weakness (generalized) (M62.81)    Time: [id number] PT Time Calculation (min) (ACUTE ONLY): 25 min   Charges:   PT Evaluation $PT Eval Moderate Complexity: 1 Mod PT  Treatments $Therapeutic Exercise: 8-22 mins       1:46 PM, 09/19/19 Etta Grandchild, PT, DPT Physical Therapist - [facility]  [phone] ([place])    [place] Thalmann C 09/19/2019, 1:38 PM

## 2019-09-20 LAB — RESPIRATORY PANEL BY RT PCR (FLU A&B, COVID)
Influenza A by PCR: NEGATIVE
Influenza B by PCR: NEGATIVE
SARS Coronavirus 2 by RT PCR: POSITIVE — AB

## 2019-09-20 LAB — GLUCOSE, CAPILLARY
Glucose-Capillary: 187 mg/dL — ABNORMAL HIGH (ref 70–99)
Glucose-Capillary: 236 mg/dL — ABNORMAL HIGH (ref 70–99)
Glucose-Capillary: 250 mg/dL — ABNORMAL HIGH (ref 70–99)
Glucose-Capillary: 215 mg/dL — ABNORMAL HIGH (ref 70–99)

## 2019-09-20 LAB — CBC WITH DIFFERENTIAL/PLATELET
Abs Immature Granulocytes: 0.13 10*3/uL — ABNORMAL HIGH (ref 0.00–0.07)
Basophils Absolute: 0 10*3/uL (ref 0.0–0.1)
Basophils Relative: 0 %
Eosinophils Absolute: 0 10*3/uL (ref 0.0–0.5)
Eosinophils Relative: 0 %
HCT: 35.4 % — ABNORMAL LOW (ref 39.0–52.0)
Hemoglobin: 11.1 g/dL — ABNORMAL LOW (ref 13.0–17.0)
Immature Granulocytes: 1 %
Lymphocytes Relative: 8 %
Lymphs Abs: 0.8 10*3/uL (ref 0.7–4.0)
MCH: 27.5 pg (ref 26.0–34.0)
MCHC: 31.4 g/dL (ref 30.0–36.0)
MCV: 87.8 fL (ref 80.0–100.0)
Monocytes Absolute: 0.6 10*3/uL (ref 0.1–1.0)
Monocytes Relative: 5 %
Neutro Abs: 8.8 10*3/uL — ABNORMAL HIGH (ref 1.7–7.7)
Neutrophils Relative %: 86 %
Platelets: 306 10*3/uL (ref 150–400)
RBC: 4.03 MIL/uL — ABNORMAL LOW (ref 4.22–5.81)
RDW: 15.1 % (ref 11.5–15.5)
WBC: 10.3 10*3/uL (ref 4.0–10.5)
nRBC: 0 % (ref 0.0–0.2)

## 2019-09-20 LAB — PHOSPHORUS: Phosphorus: 3.3 mg/dL (ref 2.5–4.6)

## 2019-09-20 LAB — URINE CULTURE

## 2019-09-20 LAB — COMPREHENSIVE METABOLIC PANEL
ALT: 10 U/L (ref 0–44)
AST: 35 U/L (ref 15–41)
Albumin: 3.2 g/dL — ABNORMAL LOW (ref 3.5–5.0)
Alkaline Phosphatase: 83 U/L (ref 38–126)
Anion gap: 11 (ref 5–15)
BUN: 27 mg/dL — ABNORMAL HIGH (ref 8–23)
CO2: 23 mmol/L (ref 22–32)
Calcium: 8.8 mg/dL — ABNORMAL LOW (ref 8.9–10.3)
Chloride: 102 mmol/L (ref 98–111)
Creatinine, Ser: 1.05 mg/dL (ref 0.61–1.24)
GFR calc Af Amer: >60 mL/min (ref >60)
GFR calc non Af Amer: >60 mL/min (ref >60)
Glucose, Bld: 212 mg/dL — ABNORMAL HIGH (ref 70–99)
Potassium: 4.8 mmol/L (ref 3.5–5.1)
Sodium: 136 mmol/L (ref 135–145)
Total Bilirubin: 0.7 mg/dL (ref 0.3–1.2)
Total Protein: 7 g/dL (ref 6.5–8.1)

## 2019-09-20 LAB — C-REACTIVE PROTEIN: CRP: 12.8 mg/dL — ABNORMAL HIGH (ref <1.0)

## 2019-09-20 LAB — FERRITIN: Ferritin: 122 ng/mL (ref 24–336)

## 2019-09-20 LAB — MAGNESIUM: Magnesium: 2.2 mg/dL (ref 1.7–2.4)

## 2019-09-20 LAB — FIBRIN DERIVATIVES D-DIMER (ARMC ONLY): Fibrin derivatives D-dimer (ARMC): 2352.96 ng/mL (FEU) — ABNORMAL HIGH (ref 0.00–499.00)

## 2019-09-20 MED ORDER — QUETIAPINE FUMARATE 25 MG PO TABS
25.0000 mg | ORAL_TABLET | Freq: Once | ORAL | Status: AC
  Administered 2019-09-20: 23:00:00 25 mg via ORAL
  Filled 2019-09-20: qty 1

## 2019-09-20 NOTE — Progress Notes (Signed)
[place] [facility] [place] NAME: Timothy [name]    MR#:  [id number]  DATE OF BIRTH:  [birth year]  SUBJECTIVE:  patient came in with increasing cough fever and chills for a week. Recently went on a beach. Got his Pfizer vaccination last month. Tolerating PO diet. Sats are 92-97% on 1 L nasal cannula oxygen no respiratory distress,no fever  REVIEW OF SYSTEMS:   Review of Systems  Constitutional: Negative for chills, fever and weight loss.  HENT: Negative for ear discharge, ear pain and nosebleeds.   Eyes: Negative for blurred vision, pain and discharge.  Respiratory: Positive for shortness of breath. Negative for sputum production, wheezing and stridor.   Cardiovascular: Negative for chest pain, palpitations, orthopnea and PND.  Gastrointestinal: Negative for abdominal pain, diarrhea, nausea and vomiting.  Genitourinary: Negative for frequency and urgency.  Musculoskeletal: Negative for back pain and joint pain.  Neurological: Positive for weakness. Negative for sensory change, speech change and focal weakness.  Psychiatric/Behavioral: Negative for depression and hallucinations. The patient is not nervous/anxious.    Tolerating Diet:yes Tolerating PT: pending  DRUG ALLERGIES:  No Known Allergies  VITALS:  Blood pressure (!) 117/51, pulse 83, temperature 98.1 F (36.7 C), temperature source Oral, resp. rate (!) 23, height 5\' 9"  (1.753 m), weight 78 kg, SpO2 93 %.  PHYSICAL EXAMINATION:   Physical Exam  GENERAL:  86 y.o.-year-old patient lying in the bed with no acute distress.  EYES: Pupils equal, round, reactive to light and accommodation. No scleral icterus.   HEENT: Head atraumatic, normocephalic. Oropharynx and nasopharynx clear.  NECK:  Supple, no jugular venous distention. No thyroid enlargement, no tenderness.  LUNGS: decreased breath sounds bilaterally bases , no wheezing, rales, rhonchi. No use of accessory muscles of respiration.   CARDIOVASCULAR: S1, S2 normal. No murmurs, rubs, or gallops.  ABDOMEN: Soft, nontender, nondistended. Bowel sounds present. No organomegaly or mass.  EXTREMITIES: No cyanosis, clubbing or edema b/l.    NEUROLOGIC: Cranial nerves II through XII are intact. No focal Motor or sensory deficits b/l.  Mild tremor+ PSYCHIATRIC:  patient is alert and oriented x 3.  SKIN: No obvious rash, lesion, or ulcer.   LABORATORY PANEL:  CBC Recent Labs  Lab 09/20/19 0637  WBC 10.3  HGB 11.1*  HCT 35.4*  PLT 306    Chemistries  Recent Labs  Lab 09/20/19 0637  NA 136  K 4.8  CL 102  CO2 23  GLUCOSE 212*  BUN 27*  CREATININE 1.05  CALCIUM 8.8*  MG 2.2  AST 35  ALT 10  ALKPHOS 83  BILITOT 0.7   Cardiac Enzymes No results for input(s): TROPONINI in the last 168 hours. RADIOLOGY:  No results found. ASSESSMENT AND PLAN:  Timothy [name] is a 86 y.o. male with medical history significant of hypertension, type 2 diabetes, paroxysmal A. fib on Eliquis, HFpEF, PAD and Parkinson's came to ED with complaint of fever, chills, coughing and exertional dyspnea for about a week now.  Patient recently went on the beach and return on last Friday.  Since Monday he developed fever and chills along with some cough. Patient completed his Covid vaccine with Pfizer about a month ago.  Acute hypoxic respiratory failure secondary to COVID-19 pneumonia.   -Patient symptoms, labs and chest x-ray consistent with COVID-19 pneumonia.  - Patient did completehis vaccination with Pfizer approximately 1 month ago.  Recent visit to beach where they were not using masks due to vaccination. -CRP 21.7--20.1 -  procalcitonin 0.14 - on IV remdesivir (day 3/5) and Decadron. -Oxygen supplement to maintain saturation above 90%. -Combivent inhaler. -Supportive care for cough. -Vitamin C and zinc supplement. -Patient was given azithromycin and ceftriaxone in ED for CAD.   No indication for antibiotics at present  Hypertension.   Blood pressure within goal. -Continue metoprolol  Paroxysmal A. fib.   currently in sinus rhythm. -Continue home dose of metoprolol, flecainide and Eliquis.  Chronic Parkinson's.  Continue home dose of Sinemet. -Get physical therapy consultation. -Patient is independent at baseline. He uses walker. No recent falls. He had a recent trip to the beach also.  Type 2 diabetes.  On Farxiga, Januvia and Metformin at home. -Give him Tradjenta due to Covid positive. -Start him on Levemir and SSI.  HFrEF.  Echocardiogram done in November 2021 with normal EF and grade 2 diastolic dysfunction.  Currently well compensated.  Patient appears euvolemic.  Not on any diuretic at home.  Generalized weakness with deconditioning -PT recommends rehab -TOC for discharge planning   DVT prophylaxis: Eliquis Code Status: Full code Family Communication: Son Timothy [name] was informed [id number] Disposition Plan: Pending improvement and completion of remdesivir-- to rehab Consults called: None   TOTAL TIME TAKING CARE OF THIS PATIENT: *10minutes.  >50% time spent on counselling and coordination of care  Note: This dictation was prepared with Dragon dictation along with smaller phrase technology. Any transcriptional errors that result from this process are unintentional.  Timothy [name] M.D    [facility]   CC: Primary care physician; Guadalupe Maple, MDPatient ID: Timothy [name], male   DOB: 1929/04/13, 86 y.o.   MRN: [id number]

## 2019-09-20 NOTE — TOC Progression Note (Signed)
Transition of Care [facility]) - Progression Note    Patient Details  Name: Timothy [name] MRN: [id number] Date of Birth: 28-Jun-1929  Transition of [facility]) CM/SW Contact  Sheba Whaling, Gardiner Rhyme, LCSW Phone Number: 09/20/2019, 2:58 PM  Clinical Narrative:   Spoke with pt via telephone due to he is in a [place] room. Discussed offers via [facility] and [facility] MD feels he will be medically ready on Thursday. He has chosen [facility] due to wife was at [facility] before she passed away. Have also talked with Kirtland Bouchard to inform what pt has chosen he is on board. He reports pt's wife is with her son and also has COVID. Will work toward pt going to [facility] on Thursday.    Expected Discharge Plan: [place] Barriers to Discharge: Continued Medical Work up, Other (comment)(pt's caregiver has COVID and quantined until 3/23.)  Expected Discharge Plan and Services Expected Discharge Plan: [place] In-house Referral: Clinical Social Work     Living arrangements for the past 2 months: Single Family Home                                       Social Determinants of Health (SDOH) Interventions    Readmission Risk Interventions No flowsheet data found.

## 2019-09-20 NOTE — Progress Notes (Signed)
Patient ID: Timothy [name], male   DOB: May 15, 1929, 86 y.o.   MRN: [id number] Health dept is requesting a Rapid Covid be sent to [facility] to see if this could be variant of Covid  . Dr Graylon Good has placed the order  Pt was vaccinated about 1 month ago--completed 2 doses.

## 2019-09-21 ENCOUNTER — Ambulatory Visit: Payer: Medicare Other | Admitting: Cardiovascular Disease

## 2019-09-21 LAB — COMPREHENSIVE METABOLIC PANEL
ALT: 10 U/L (ref 0–44)
AST: 29 U/L (ref 15–41)
Albumin: 3.1 g/dL — ABNORMAL LOW (ref 3.5–5.0)
Alkaline Phosphatase: 74 U/L (ref 38–126)
Anion gap: 9 (ref 5–15)
BUN: 31 mg/dL — ABNORMAL HIGH (ref 8–23)
CO2: 23 mmol/L (ref 22–32)
Calcium: 8.2 mg/dL — ABNORMAL LOW (ref 8.9–10.3)
Chloride: 103 mmol/L (ref 98–111)
Creatinine, Ser: 1.14 mg/dL (ref 0.61–1.24)
GFR calc Af Amer: >60 mL/min (ref >60)
GFR calc non Af Amer: 57 mL/min — ABNORMAL LOW (ref >60)
Glucose, Bld: 265 mg/dL — ABNORMAL HIGH (ref 70–99)
Potassium: 4.1 mmol/L (ref 3.5–5.1)
Sodium: 135 mmol/L (ref 135–145)
Total Bilirubin: 0.8 mg/dL (ref 0.3–1.2)
Total Protein: 6.5 g/dL (ref 6.5–8.1)

## 2019-09-21 LAB — C-REACTIVE PROTEIN: CRP: 7.2 mg/dL — ABNORMAL HIGH (ref <1.0)

## 2019-09-21 LAB — CBC WITH DIFFERENTIAL/PLATELET
Abs Immature Granulocytes: 0.11 10*3/uL — ABNORMAL HIGH (ref 0.00–0.07)
Basophils Absolute: 0 10*3/uL (ref 0.0–0.1)
Basophils Relative: 0 %
Eosinophils Absolute: 0 10*3/uL (ref 0.0–0.5)
Eosinophils Relative: 0 %
HCT: 32 % — ABNORMAL LOW (ref 39.0–52.0)
Hemoglobin: 10.5 g/dL — ABNORMAL LOW (ref 13.0–17.0)
Immature Granulocytes: 1 %
Lymphocytes Relative: 8 %
Lymphs Abs: 0.7 10*3/uL (ref 0.7–4.0)
MCH: 28.2 pg (ref 26.0–34.0)
MCHC: 32.8 g/dL (ref 30.0–36.0)
MCV: 85.8 fL (ref 80.0–100.0)
Monocytes Absolute: 0.8 10*3/uL (ref 0.1–1.0)
Monocytes Relative: 10 %
Neutro Abs: 7.1 10*3/uL (ref 1.7–7.7)
Neutrophils Relative %: 81 %
Platelets: 297 10*3/uL (ref 150–400)
RBC: 3.73 MIL/uL — ABNORMAL LOW (ref 4.22–5.81)
RDW: 15.1 % (ref 11.5–15.5)
WBC: 8.8 10*3/uL (ref 4.0–10.5)
nRBC: 0 % (ref 0.0–0.2)

## 2019-09-21 LAB — FIBRIN DERIVATIVES D-DIMER (ARMC ONLY): Fibrin derivatives D-dimer (ARMC): 1781.38 ng/mL (FEU) — ABNORMAL HIGH (ref 0.00–499.00)

## 2019-09-21 LAB — GLUCOSE, CAPILLARY
Glucose-Capillary: 202 mg/dL — ABNORMAL HIGH (ref 70–99)
Glucose-Capillary: 268 mg/dL — ABNORMAL HIGH (ref 70–99)
Glucose-Capillary: 157 mg/dL — ABNORMAL HIGH (ref 70–99)
Glucose-Capillary: 112 mg/dL — ABNORMAL HIGH (ref 70–99)

## 2019-09-21 LAB — MAGNESIUM: Magnesium: 2.3 mg/dL (ref 1.7–2.4)

## 2019-09-21 LAB — FERRITIN: Ferritin: 96 ng/mL (ref 24–336)

## 2019-09-21 LAB — PHOSPHORUS: Phosphorus: 3.1 mg/dL (ref 2.5–4.6)

## 2019-09-21 MED ORDER — QUETIAPINE FUMARATE 25 MG PO TABS
12.5000 mg | ORAL_TABLET | Freq: Every day | ORAL | Status: DC
  Administered 2019-09-21: 12.5 mg via ORAL
  Filled 2019-09-21: qty 1

## 2019-09-21 MED ORDER — INSULIN ASPART 100 UNIT/ML ~~LOC~~ SOLN
3.0000 [IU] | Freq: Three times a day (TID) | SUBCUTANEOUS | Status: DC
  Administered 2019-09-21 – 2019-09-22 (×2): 3 [IU] via SUBCUTANEOUS
  Filled 2019-09-21 (×4): qty 1

## 2019-09-21 MED ORDER — LORAZEPAM 2 MG/ML IJ SOLN
1.0000 mg | Freq: Once | INTRAMUSCULAR | Status: AC
  Administered 2019-09-21: 04:00:00 1 mg via INTRAVENOUS
  Filled 2019-09-21: qty 1

## 2019-09-21 MED ORDER — HALOPERIDOL LACTATE 5 MG/ML IJ SOLN
2.0000 mg | Freq: Once | INTRAMUSCULAR | Status: AC
  Administered 2019-09-21: 15:00:00 2 mg via INTRAVENOUS
  Filled 2019-09-21: qty 1

## 2019-09-21 NOTE — Progress Notes (Deleted)
Cardiology Office Note  Date:  09/21/2019   ID:  WITTEN STENERSON, DOB October 15, 1928, MRN [id number]  PCP:  Guadalupe Maple, MD   No chief complaint on file.   HPI:  Timothy [name] is a very pleasant 86 year-old gentleman with a history of  Hyperlipidemia, hypertension,  diabetes,  with no known coronary artery disease,  3.5 cm abdominal aortic aneurysm seen on ultrasound in 2014  mild to moderate aortic valve stenosis. Stress echo in early 2016 at [place] fibrillation seen on even monitor, on anticoagulation , flecainide bilateral popliteal artery aneurysms, B/l popliteal bypass in 12/2012 and 05/2013 for 1.7 cm aneurysm popliteal Back surgery 2016 US aorta 09/2012: 3.7 cm U/s 05/2015: LE arterial U/S, aorta 2.5 cm He presents today for follow-up of his high cholesterol, PAD, parkinsons  paroxysmal atrial fibrillation  Recent visit to the emergency room February 17, 2019 for near syncope Was on the toilet straining to have a bowel movement when he became very lightheaded Put his head down between his knees and felt somewhat better Has had prior episodes that have been similar In the emergency room was given IV fluids Cardiac work-up negative  In follow-up today reports that he is doing well Reports he got married 6 months ago, she presents with him today The wife feels that he is doing well  Has leg weakness, does not walk very far But a 40 foot trailer/RV Was open to go to the beach, down to [place] He walks with a walker No recent falls  Not a large amount of fluid intake  Lab work reviewed HAB1C 7.7  EKG personally reviewed by myself on todays visit Normal sinus rhythm rate 79 bpm poor R wave progression through the precordial leads left axis deviation left anterior fascicular block poor R-wave progression through the anterior precordial leads  There past medicastory reviewed Fall 02/16/2017 Hit head, concusion  surgery at [facility] in June 2016 with postoperative  complications including ileus, atrial fibrillation, congestive heart failure/swelling. He spent several weeks at rehabilitation.  In follow-up, was noted to have elevated heart rate 150 bpm, sent to the emergency room. Was found to have SVT. This broke on its own then developed atrial fibrillation.   12/14/2012 he had popliteal aneurysm surgery at [facility]. Other leg was done 05/21/2013.   PMH:   has a past medical history of (HFpEF) heart failure with preserved ejection fraction ([place]), AAA (abdominal aortic aneurysm) ([place]), Back pain, Esophageal reflux, Essential hypertension, History of stress test, Mixed hyperlipidemia, Moderate aortic stenosis, Osteitis deformans without mention of bone tumor, PAD (peripheral artery disease) ([place]), Parkinson's disease ([place]), Persistent atrial fibrillation ([place]), Senile osteoporosis, Skin cancer, Swelling of right lower extremity, TIA (transient ischemic attack), and Type II diabetes mellitus ([place]).  PSH:    Past Surgical History:  Procedure Laterality Date  . APPENDECTOMY    . BACK SURGERY    . CATARACT EXTRACTION    . COLONOSCOPY  [id number]  . ESOPHAGEAL DILATION    . GALLBLADDER SURGERY    . HERNIA REPAIR    . RETINAL DETACHMENT SURGERY    . TONSILLECTOMY      No current facility-administered medications for this visit.   No current outpatient medications on file.   Facility-Administered Medications Ordered in Other Visits  Medication Dose Route Frequency Provider Last Rate Last Admin  . acetaminophen (TYLENOL) tablet 650 mg  650 mg Oral Q6H PRN Lorella Nimrod, MD      . apixaban (ELIQUIS) tablet 5 mg  5 mg Oral BID Lorella Nimrod, MD   5 mg at 09/20/19 2238  . ascorbic acid (VITAMIN C) tablet 500 mg  500 mg Oral Daily Lorella Nimrod, MD   500 mg at 09/20/19 0854  . calcium-vitamin D (OSCAL WITH D) 500-200 MG-UNIT per tablet 2 tablet  2 tablet Oral Daily Lorella Nimrod, MD   2 tablet at 09/20/19 0853  . carbidopa-levodopa (SINEMET IR)  25-100 MG per tablet immediate release 1 tablet  1 tablet Oral QID Lorella Nimrod, MD   1 tablet at 09/20/19 2239  . chlorpheniramine-HYDROcodone (TUSSIONEX) 10-8 MG/5ML suspension 5 mL  5 mL Oral Q12H PRN Lorella Nimrod, MD      . dexamethasone (DECADRON) tablet 6 mg  6 mg Oral Q24H Lorella Nimrod, MD   6 mg at 09/20/19 1223  . flecainide (TAMBOCOR) tablet 50 mg  50 mg Oral BID Lorella Nimrod, MD   50 mg at 09/20/19 2249  . guaiFENesin-dextromethorphan (ROBITUSSIN DM) 100-10 MG/5ML syrup 10 mL  10 mL Oral Q4H PRN Lorella Nimrod, MD      . insulin aspart (novoLOG) injection 0-15 Units  0-15 Units Subcutaneous TID WC Lorella Nimrod, MD   5 Units at 09/20/19 1755  . insulin aspart (novoLOG) injection 0-5 Units  0-5 Units Subcutaneous QHS Lorella Nimrod, MD   2 Units at 09/20/19 2250  . insulin detemir (LEVEMIR) injection 15 Units  15 Units Subcutaneous Daily Fritzi Mandes, MD   15 Units at 09/20/19 [phone]  . Ipratropium-Albuterol (COMBIVENT) respimat 1 puff  1 puff Inhalation Q6H Lorella Nimrod, MD   1 puff at 09/21/19 0234  . linagliptin (TRADJENTA) tablet 5 mg  5 mg Oral Daily Lorella Nimrod, MD   5 mg at 09/20/19 0852  . metoprolol succinate (TOPROL-XL) 24 hr tablet 50 mg  50 mg Oral Daily Lorella Nimrod, MD   50 mg at 09/20/19 0854  . ondansetron (ZOFRAN) tablet 4 mg  4 mg Oral Q6H PRN Lorella Nimrod, MD       Or  . ondansetron (ZOFRAN) injection 4 mg  4 mg Intravenous Q6H PRN Lorella Nimrod, MD      . pantoprazole (PROTONIX) EC tablet 40 mg  40 mg Oral BID Lorella Nimrod, MD   40 mg at 09/20/19 2238  . polyethylene glycol (MIRALAX / GLYCOLAX) packet 17 g  17 g Oral Daily PRN Lorella Nimrod, MD      . remdesivir 100 mg in sodium chloride 0.9 % 100 mL IVPB  100 mg Intravenous Daily Lorella Nimrod, MD   Stopped at 09/20/19 [phone]  . rosuvastatin (CRESTOR) tablet 10 mg  10 mg Oral Daily Lorella Nimrod, MD   10 mg at 09/20/19 1755  . zinc sulfate capsule 220 mg  220 mg Oral Daily Lorella Nimrod, MD   220 mg at 09/20/19  [id number]     Allergies:   Patient has no known allergies.   Social History:  The patient  reports that he has never smoked. He has never used smokeless tobacco. He reports that he does not drink alcohol or use drugs.   Family History:   family history includes Diabetes in his father and another family member; Heart disease in his brother and mother; Stroke in his brother.    Review of Systems: Review of Systems  Constitutional: Negative.   Respiratory: Negative.   Cardiovascular: Negative.   Gastrointestinal: Negative.   Musculoskeletal:       Unsteady gait  Neurological: Negative.   Psychiatric/Behavioral: Negative.   All  other systems reviewed and are negative.   PHYSICAL EXAM: VS:  There were no vitals taken for this visit. , BMI There is no height or weight on file to calculate BMI. No change in exam on today's visit GEN: Well nourished, well developed, in no acute distress , presents with a cane HEENT: normal  Neck: no JVD, carotid bruits, or masses Cardiac: RRR; 2-3/6 SEM right sternal border, no rubs, or gallops,no edema  Respiratory:  clear to auscultation bilaterally, normal work of breathing GI: soft, nontender, nondistended, + BS MS: no deformity or atrophy  Skin: warm and dry, no rash Neuro:  Strength and sensation are intact Psych: euthymic mood, full affect    Recent Labs: 09/21/2019: ALT 10; BUN 31; Creatinine, Ser 1.14; Hemoglobin 10.5; Magnesium 2.3; Platelets 297; Potassium 4.1; Sodium 135    Lipid Panel Lab Results  Component Value Date   CHOL 116 12/10/2017   HDL 32 (L) 12/10/2017   LDLCALC 21 12/10/2017   TRIG 317 (H) 12/10/2017      Wt Readings from Last 3 Encounters:  09/18/19 171 lb 15.3 oz (78 kg)  09/14/19 173 lb (78.5 kg)  06/10/19 171 lb 4 oz (77.7 kg)      ASSESSMENT AND PLAN:  Paroxysmal atrial fibrillation (HCC) - Plan: EKG 12-Lead Maintaining normal sinus rhythm Tolerating anticoagulation Continue low-dose metoprolol 25  daily  AAA (abdominal aortic aneurysm) without rupture (HCC) Mildly dilated aneurysm in 2014 3.7 cm July 2019  Essential hypertension Suspect high blood pressure at home in the morning lower in the afternoon today down to [id number] systolic Asymptomatic Currently not on benazepril only on metoprolol 25  Type 2 diabetes mellitus with other circulatory complication, without long-term current use of insulin (HCC) Typically runs high 8.0 Recommended low carbohydrate diet  Parkinson's disease ([place]) On Sinemet, need to exercise Legs are weaker Using a walker, high risk of falls  Peripheral arterial disease ([place]) Bilateral popliteal aneurysm, had bilateral bypass surgery at [facility] Periodic ultrasound  Aortic valve stenosis  moderate aortic valve stenosis July 2019 We have recommended repeat echocardiogram   Total encounter time more than 25 minutes  Greater than 50% was spent in counseling and coordination of care with the patient   Disposition:   F/U  12 months   No orders of the defined types were placed in this encounter.    Signed, Esmond Plants, M.D., Ph.D. 09/21/2019  [place], [place]

## 2019-09-21 NOTE — Progress Notes (Signed)
[facility] [place] NAME: Nehamiah Himmelsbach    MR#:  [id number]  DATE OF BIRTH:  June 10, 1929  SUBJECTIVE:  patient came in with increasing cough fever and chills for a week. Recently went on a beach. Got his Pfizer vaccination last month. Tolerating PO diet. Sats are 92-97% on 1 L nasal cannula oxygen no respiratory distress,no fever  REVIEW OF SYSTEMS:   Review of Systems  Constitutional: Negative for chills, fever and weight loss.  HENT: Negative for ear discharge, ear pain and nosebleeds.   Eyes: Negative for blurred vision, pain and discharge.  Respiratory: Positive for shortness of breath. Negative for sputum production, wheezing and stridor.   Cardiovascular: Negative for chest pain, palpitations, orthopnea and PND.  Gastrointestinal: Negative for abdominal pain, diarrhea, nausea and vomiting.  Genitourinary: Negative for frequency and urgency.  Musculoskeletal: Negative for back pain and joint pain.  Neurological: Positive for weakness. Negative for sensory change, speech change and focal weakness.  Psychiatric/Behavioral: Negative for depression and hallucinations. The patient is not nervous/anxious.    Tolerating Diet:yes Tolerating PT: pending  DRUG ALLERGIES:  No Known Allergies  VITALS:  Blood pressure 138/63, pulse 70, temperature 98.1 F (36.7 C), temperature source Oral, resp. rate (!) 22, height 5\' 9"  (1.753 m), weight 78 kg, SpO2 92 %.  PHYSICAL EXAMINATION:   Physical Exam  GENERAL:  86 y.o.-year-old patient lying in the bed with no acute distress.  EYES: Pupils equal, round, reactive to light and accommodation. No scleral icterus.   HEENT: Head atraumatic, normocephalic. Oropharynx and nasopharynx clear.  NECK:  Supple, no jugular venous distention. No thyroid enlargement, no tenderness.  LUNGS: decreased breath sounds bilaterally bases , no wheezing, rales, rhonchi. No use of accessory muscles of respiration.   CARDIOVASCULAR: S1, S2 normal. No murmurs, rubs, or gallops.  ABDOMEN: Soft, nontender, nondistended. Bowel sounds present. No organomegaly or mass.  EXTREMITIES: No cyanosis, clubbing or edema b/l.    NEUROLOGIC: Cranial nerves II through XII are intact. No focal Motor or sensory deficits b/l.  Mild tremor+ PSYCHIATRIC:  patient is alert and oriented x 3.  SKIN: No obvious rash, lesion, or ulcer.   LABORATORY PANEL:  CBC Recent Labs  Lab 09/21/19 0454  WBC 8.8  HGB 10.5*  HCT 32.0*  PLT 297    Chemistries  Recent Labs  Lab 09/21/19 0454  NA 135  K 4.1  CL 103  CO2 23  GLUCOSE 265*  BUN 31*  CREATININE 1.14  CALCIUM 8.2*  MG 2.3  AST 29  ALT 10  ALKPHOS 74  BILITOT 0.8   Cardiac Enzymes No results for input(s): TROPONINI in the last 168 hours. RADIOLOGY:  No results found. ASSESSMENT AND PLAN:  SAYLOR BANFILL is a 86 y.o. male with medical history significant of hypertension, type 2 diabetes, paroxysmal A. fib on Eliquis, HFpEF, PAD and Parkinson's came to ED with complaint of fever, chills, coughing and exertional dyspnea for about a week now.  Patient recently went on the beach and return on last Friday.  Since Monday he developed fever and chills along with some cough. Patient completed his Covid vaccine with Pfizer about a month ago.  Acute hypoxic respiratory failure secondary to COVID-19 pneumonia.   -Patient symptoms, labs and chest x-ray consistent with COVID-19 pneumonia.  - Patient did completehis vaccination with Pfizer approximately 1 month ago.  Recent visit to beach where they were not using masks due to vaccination. -CRP 21.7--20.1 -  procalcitonin 0.14 - on IV remdesivir 3/14=>3/18 and Decadron. -Oxygen supplement to maintain saturation above 90%. -Combivent inhaler. -Supportive care for cough. -Vitamin C and zinc supplement. -Patient was given azithromycin and ceftriaxone in ED for CAP.   No indication for antibiotics at present  Hypertension.   Blood pressure within goal. -Continue metoprolol  Paroxysmal A. fib.   currently in sinus rhythm. -Continue home dose of metoprolol, flecainide and Eliquis.  Chronic Parkinson's.  Continue home dose of Sinemet. -Get physical therapy consultation. -Patient is independent at baseline. He uses walker. No recent falls. He had a recent trip to the beach also.  Type 2 diabetes.  On Farxiga, Januvia and Metformin at home. -Give him Tradjenta due to Covid positive. -Start him on Levemir and SSI.  HFrEF.  Echocardiogram done in November 2021 with normal EF and grade 2 diastolic dysfunction.  Currently well compensated.  Patient appears euvolemic.  Not on any diuretic at home.  Generalized weakness with deconditioning -PT recommends rehab -TOC for discharge planning  Acute delirium and agitation, possible secondary to COVID-19 infection versus hypoxia 3/16 Seroquel 25 mg x 1 dose given 3/17 Ativan was given early morning around 3 AM 3/17 Haldol 1 dose given in the afternoon 3/17 started Seroquel 12.5 mg nightly  DVT prophylaxis: Eliquis Code Status: Full code Family Communication: Son Idrees Orlandini was informed [phone] by previous attending Disposition Plan: Pending improvement and completion of remdesivir-- to rehab possible tomorrow if remains stable.  Consults called: None   TOTAL TIME TAKING CARE OF THIS PATIENT: *59minutes.  >50% time spent on counselling and coordination of care  Note: This dictation was prepared with Dragon dictation along with smaller phrase technology. Any transcriptional errors that result from this process are unintentional.  Val Riles M.D    [facility]   CC: Primary care physician; Guadalupe Maple, MDPatient ID: Duane Lope, male   DOB: Jan 24, 1929, 86 y.o.   MRN: [id number]

## 2019-09-21 NOTE — Progress Notes (Signed)
Inpatient Diabetes Program Recommendations  AACE/ADA: New Consensus Statement on Inpatient Glycemic Control (2015)  Target Ranges:  Prepandial:   less than 140 mg/dL      Peak postprandial:   less than 180 mg/dL (1-2 hours)      Critically ill patients:  140 - 180 mg/dL   Lab Results  Component Value Date   GLUCAP 202 (H) 09/21/2019   HGBA1C 7.6 (H) 09/18/2019    Review of Glycemic Control Results for Timothy [name], Timothy [name] (MRN [id number]) as of 09/21/2019 11:01  Ref. Range 09/20/2019 07:48 09/20/2019 11:35 09/20/2019 16:14 09/20/2019 21:09 09/21/2019 08:26  Glucose-Capillary Latest Ref Range: 70 - 99 mg/dL 187 (H) 236 (H) 250 (H) 215 (H) 202 (H)   Inpatient Diabetes Program Recommendations:   Noted Postprandial CBGs elevated: Please consider while on steroids: -Novolog 3 units tid meal coverage if eats 50%  Thank you, Bethena Roys E. Jaevion Goto, RN, MSN, CDE  Diabetes Coordinator Inpatient Glycemic Control Team Team Pager [phone] (8am-5pm) 09/21/2019 11:02 AM

## 2019-09-21 NOTE — Care Management Important Message (Signed)
Important Message  Patient Details  Name: Timothy [name] MRN: [id number] Date of Birth: 1929/03/09   Medicare Important Message Given:  Yes  Pt is in a COVID bed, gave to bedside RN to give to pt   Elease Hashimoto, LCSW 09/21/2019, 9:49 AM

## 2019-09-21 NOTE — Progress Notes (Signed)
Physical Therapy Treatment Patient Details Name: Timothy [name] MRN: [id number] DOB: 05/12/1929 Today's Date: 09/21/2019    History of Present Illness Timothy [name] is an 52yoM who [facility] c fever, chills, SOB. Pt out of town to beach last week, ill since return. Pt is s/p Pfizer vaccine, 2nd dose about 1 month ago. Pt complaining of malaise to the point that he was unable to get OOB. In ED pt febrile, hypoxic, labs (+) COVID19 infection. PMH: hypertension, type 2 diabetes, paroxysmal A. fib on Eliquis, HFpEF, PAD and Parkinson's disease.    PT Comments    Pt asleep upon entry, breakfast tray in room, untouched. Pt awakened, remains fairly lethargic, improves to drowsy, but interactive, although he seems more confused this date. Pt had not been issued his PD meds yet, hence worse bradykinesia, hypophonia. Pt set up in chair with breakfast tray, attempting to feed self, but unsuccessful. Author assisted with meal prep and helped with feeding. Pt requiring 2L/min for sats >89% after moving to chair- 1L/min upon entry. RN in room for last part of session.     Follow Up Recommendations  SNF;Supervision for mobility/OOB;Supervision - Intermittent     Equipment Recommendations  None recommended by PT    Recommendations for Other Services       Precautions / Restrictions Precautions Precautions: Fall Precaution Comments: more confused this date Restrictions Weight Bearing Restrictions: No    Mobility  Bed Mobility Overal bed mobility: Needs Assistance Bed Mobility: Supine to Sit     Supine to sit: Max assist     General bed mobility comments: lethargic, but participating, putting forth effort  Transfers Overall transfer level: Needs assistance Equipment used: 1 person hand held assist   Sit to Stand: Max assist         General transfer comment: dependent style transfer to chair, pt holding onto authors arms.  Ambulation/Gait Ambulation/Gait assistance:  (unable at this time)               Marine scientist [place] (Stroke Patients Only)       Balance Overall balance assessment: Needs assistance Sitting-balance support: Feet supported;Single extremity supported Sitting balance-Leahy Scale: Fair     Standing balance support: During functional activity Standing balance-Leahy Scale: Zero                              Cognition Arousal/Alertness: Lethargic   Overall Cognitive Status: Impaired/Different from baseline(more lethargic, sleepy, confused. WNL for interacting durign meal assist.)                                        Exercises      General Comments        Pertinent Vitals/Pain Pain Assessment: No/denies pain    Home Living                      Prior Function            PT Goals (current goals can now be found in the care plan section) Acute Rehab PT Goals Patient Stated Goal: regain strength, return to walking modI in home PT Goal Formulation: With patient Time For Goal Achievement: 10/03/19 Potential to Achieve Goals: Fair Progress  towards PT goals: Progressing toward goals    Frequency    Min 2X/week      PT Plan Current plan remains appropriate    Co-evaluation              AM-PAC PT "6 Clicks" Mobility   Outcome Measure  Help needed turning from your back to your side while in a flat bed without using bedrails?: Total Help needed moving from lying on your back to sitting on the side of a flat bed without using bedrails?: Total Help needed moving to and from a bed to a chair (including a wheelchair)?: Total Help needed standing up from a chair using your arms (e.g., wheelchair or bedside chair)?: Total Help needed to walk in hospital room?: Total Help needed climbing 3-5 steps with a railing? : Total 6 Click Score: 6    End of Session Equipment Utilized During Treatment: Oxygen Activity  Tolerance: No increased pain;Patient tolerated treatment well;Patient limited by lethargy Patient left: in bed;with call bell/phone within reach;with bed alarm set Nurse Communication: Mobility status PT Visit Diagnosis: Unsteadiness on feet (R26.81);Difficulty in walking, not elsewhere classified (R26.2);Muscle weakness (generalized) (M62.81)     Time: 1020-1100 PT Time Calculation (min) (ACUTE ONLY): 40 min  Charges:  $Therapeutic Exercise: 8-22 mins $Therapeutic Activity: 23-37 mins                1:43 PM, 09/21/19 Etta Grandchild, PT, DPT Physical Therapist - [facility]  [phone] ([place])     Alverto Shedd C 09/21/2019, 1:39 PM

## 2019-09-22 DIAGNOSIS — M6389 Disorders of muscle in diseases classified elsewhere, multiple sites: Secondary | ICD-10-CM | POA: Diagnosis not present

## 2019-09-22 DIAGNOSIS — I10 Essential (primary) hypertension: Secondary | ICD-10-CM | POA: Diagnosis not present

## 2019-09-22 DIAGNOSIS — U071 COVID-19: Secondary | ICD-10-CM | POA: Diagnosis not present

## 2019-09-22 DIAGNOSIS — I110 Hypertensive heart disease with heart failure: Secondary | ICD-10-CM | POA: Diagnosis not present

## 2019-09-22 DIAGNOSIS — J1282 Pneumonia due to coronavirus disease 2019: Secondary | ICD-10-CM | POA: Diagnosis not present

## 2019-09-22 DIAGNOSIS — I480 Paroxysmal atrial fibrillation: Secondary | ICD-10-CM | POA: Diagnosis not present

## 2019-09-22 DIAGNOSIS — G118 Other hereditary ataxias: Secondary | ICD-10-CM | POA: Diagnosis not present

## 2019-09-22 DIAGNOSIS — F419 Anxiety disorder, unspecified: Secondary | ICD-10-CM | POA: Diagnosis not present

## 2019-09-22 DIAGNOSIS — R2681 Unsteadiness on feet: Secondary | ICD-10-CM | POA: Diagnosis not present

## 2019-09-22 DIAGNOSIS — R0600 Dyspnea, unspecified: Secondary | ICD-10-CM | POA: Diagnosis not present

## 2019-09-22 DIAGNOSIS — F4320 Adjustment disorder, unspecified: Secondary | ICD-10-CM | POA: Diagnosis not present

## 2019-09-22 DIAGNOSIS — I5032 Chronic diastolic (congestive) heart failure: Secondary | ICD-10-CM | POA: Diagnosis not present

## 2019-09-22 DIAGNOSIS — R41841 Cognitive communication deficit: Secondary | ICD-10-CM | POA: Diagnosis not present

## 2019-09-22 DIAGNOSIS — Z7401 Bed confinement status: Secondary | ICD-10-CM | POA: Diagnosis not present

## 2019-09-22 DIAGNOSIS — E441 Mild protein-calorie malnutrition: Secondary | ICD-10-CM | POA: Diagnosis not present

## 2019-09-22 DIAGNOSIS — E119 Type 2 diabetes mellitus without complications: Secondary | ICD-10-CM | POA: Diagnosis not present

## 2019-09-22 DIAGNOSIS — I2510 Atherosclerotic heart disease of native coronary artery without angina pectoris: Secondary | ICD-10-CM | POA: Diagnosis not present

## 2019-09-22 DIAGNOSIS — R040 Epistaxis: Secondary | ICD-10-CM | POA: Diagnosis not present

## 2019-09-22 DIAGNOSIS — E785 Hyperlipidemia, unspecified: Secondary | ICD-10-CM | POA: Diagnosis not present

## 2019-09-22 DIAGNOSIS — F4321 Adjustment disorder with depressed mood: Secondary | ICD-10-CM | POA: Diagnosis not present

## 2019-09-22 DIAGNOSIS — K219 Gastro-esophageal reflux disease without esophagitis: Secondary | ICD-10-CM | POA: Diagnosis not present

## 2019-09-22 DIAGNOSIS — M2550 Pain in unspecified joint: Secondary | ICD-10-CM | POA: Diagnosis not present

## 2019-09-22 DIAGNOSIS — R5381 Other malaise: Secondary | ICD-10-CM | POA: Diagnosis not present

## 2019-09-22 DIAGNOSIS — F05 Delirium due to known physiological condition: Secondary | ICD-10-CM | POA: Diagnosis not present

## 2019-09-22 DIAGNOSIS — G20 Parkinson's disease: Secondary | ICD-10-CM | POA: Diagnosis not present

## 2019-09-22 DIAGNOSIS — D649 Anemia, unspecified: Secondary | ICD-10-CM | POA: Diagnosis not present

## 2019-09-22 DIAGNOSIS — I739 Peripheral vascular disease, unspecified: Secondary | ICD-10-CM | POA: Diagnosis not present

## 2019-09-22 DIAGNOSIS — J9600 Acute respiratory failure, unspecified whether with hypoxia or hypercapnia: Secondary | ICD-10-CM | POA: Diagnosis not present

## 2019-09-22 DIAGNOSIS — M6281 Muscle weakness (generalized): Secondary | ICD-10-CM | POA: Diagnosis not present

## 2019-09-22 LAB — CBC WITH DIFFERENTIAL/PLATELET
Abs Immature Granulocytes: 0.12 10*3/uL — ABNORMAL HIGH (ref 0.00–0.07)
Basophils Absolute: 0 10*3/uL (ref 0.0–0.1)
Basophils Relative: 0 %
Eosinophils Absolute: 0 10*3/uL (ref 0.0–0.5)
Eosinophils Relative: 0 %
HCT: 35.7 % — ABNORMAL LOW (ref 39.0–52.0)
Hemoglobin: 11.4 g/dL — ABNORMAL LOW (ref 13.0–17.0)
Immature Granulocytes: 1 %
Lymphocytes Relative: 10 %
Lymphs Abs: 0.9 10*3/uL (ref 0.7–4.0)
MCH: 27.7 pg (ref 26.0–34.0)
MCHC: 31.9 g/dL (ref 30.0–36.0)
MCV: 86.9 fL (ref 80.0–100.0)
Monocytes Absolute: 0.7 10*3/uL (ref 0.1–1.0)
Monocytes Relative: 8 %
Neutro Abs: 6.9 10*3/uL (ref 1.7–7.7)
Neutrophils Relative %: 81 %
Platelets: 335 10*3/uL (ref 150–400)
RBC: 4.11 MIL/uL — ABNORMAL LOW (ref 4.22–5.81)
RDW: 15.1 % (ref 11.5–15.5)
WBC: 8.5 10*3/uL (ref 4.0–10.5)
nRBC: 0 % (ref 0.0–0.2)

## 2019-09-22 LAB — COMPREHENSIVE METABOLIC PANEL
ALT: 8 U/L (ref 0–44)
AST: 25 U/L (ref 15–41)
Albumin: 3.2 g/dL — ABNORMAL LOW (ref 3.5–5.0)
Alkaline Phosphatase: 71 U/L (ref 38–126)
Anion gap: 12 (ref 5–15)
BUN: 30 mg/dL — ABNORMAL HIGH (ref 8–23)
CO2: 20 mmol/L — ABNORMAL LOW (ref 22–32)
Calcium: 8.6 mg/dL — ABNORMAL LOW (ref 8.9–10.3)
Chloride: 104 mmol/L (ref 98–111)
Creatinine, Ser: 1.12 mg/dL (ref 0.61–1.24)
GFR calc Af Amer: >60 mL/min (ref >60)
GFR calc non Af Amer: 58 mL/min — ABNORMAL LOW (ref >60)
Glucose, Bld: 199 mg/dL — ABNORMAL HIGH (ref 70–99)
Potassium: 4.2 mmol/L (ref 3.5–5.1)
Sodium: 136 mmol/L (ref 135–145)
Total Bilirubin: 0.9 mg/dL (ref 0.3–1.2)
Total Protein: 6.7 g/dL (ref 6.5–8.1)

## 2019-09-22 LAB — FERRITIN: Ferritin: 77 ng/mL (ref 24–336)

## 2019-09-22 LAB — PHOSPHORUS: Phosphorus: 4 mg/dL (ref 2.5–4.6)

## 2019-09-22 LAB — MAGNESIUM: Magnesium: 2.1 mg/dL (ref 1.7–2.4)

## 2019-09-22 LAB — FIBRIN DERIVATIVES D-DIMER (ARMC ONLY): Fibrin derivatives D-dimer (ARMC): 1717.74 ng/mL (FEU) — ABNORMAL HIGH (ref 0.00–499.00)

## 2019-09-22 LAB — GLUCOSE, CAPILLARY
Glucose-Capillary: 192 mg/dL — ABNORMAL HIGH (ref 70–99)
Glucose-Capillary: 270 mg/dL — ABNORMAL HIGH (ref 70–99)
Glucose-Capillary: 344 mg/dL — ABNORMAL HIGH (ref 70–99)

## 2019-09-22 LAB — C-REACTIVE PROTEIN: CRP: 5.6 mg/dL — ABNORMAL HIGH (ref <1.0)

## 2019-09-22 MED ORDER — ACETAMINOPHEN 325 MG PO TABS: 650.0000 mg | ORAL_TABLET | Freq: Four times a day (QID) | ORAL | Status: DC | PRN

## 2019-09-22 MED ORDER — DEXAMETHASONE 6 MG PO TABS: 6.0000 mg | ORAL_TABLET | ORAL | 0 refills | Status: AC

## 2019-09-22 MED ORDER — GUAIFENESIN-DM 100-10 MG/5ML PO SYRP: 10.0000 mL | ORAL_SOLUTION | Freq: Four times a day (QID) | ORAL | 0 refills | Status: AC | PRN

## 2019-09-22 MED ORDER — IPRATROPIUM-ALBUTEROL 20-100 MCG/ACT IN AERS: 1.0000 | INHALATION_SPRAY | Freq: Four times a day (QID) | RESPIRATORY_TRACT | 0 refills | Status: DC

## 2019-09-22 MED ORDER — ZINC SULFATE 220 (50 ZN) MG PO CAPS: 220.0000 mg | ORAL_CAPSULE | Freq: Every day | ORAL | 0 refills | Status: AC

## 2019-09-22 MED ORDER — ASCORBIC ACID 500 MG PO TABS: 500.0000 mg | ORAL_TABLET | Freq: Every day | ORAL | 0 refills | Status: AC

## 2019-09-22 NOTE — Discharge Summary (Signed)
[facility] Discharge Summary   Patient: Timothy [name] [id number]  PCP: Guadalupe Maple, MD  Date of admission: 09/18/2019   Date of discharge:  09/22/2019     Discharge Diagnoses:   Principal Problem:   Pneumonia due to COVID-19 virus Active Problems:   Essential hypertension   AF (paroxysmal atrial fibrillation) ([place])   Type 2 diabetes mellitus without complication, without long-term current use of insulin ([place])   Acute respiratory failure with hypoxia ([place])   Admitted From: Home Disposition:  SNF   Recommendations for Outpatient Follow-up:  1. PCP: Follow-up with PCP in 1 week after discharge, patient should be seen by MD in the facility, monitor fingerstick blood glucose closely as patient is on dexamethasone for few days due to COVID-19 pneumonia.  Gradually wean off supplemental oxygen 2. Follow up LABS/TEST: Repeat chest x-ray after 4 weeks for resolution of pneumonia  Contact information for after-discharge care    Destination    [facility] .   Service: Skilled Nursing Contact information: [address] [place] [phone]             Diet recommendation: Carb modified diet  Activity: The patient is advised to gradually reintroduce usual activities, as tolerated  Discharge Condition: stable  Code Status: Full code   History of present illness: As per the H and P dictated on admission Taggart Washabaugh Byrdis a 86 y.o.malewith medical history significant ofhypertension, type 2 diabetes, paroxysmal A. fib on Eliquis, HFpEF, PAD and Parkinson's came to ED with complaint of fever, chills, coughing and exertional dyspnea for about a week now. Patient recently went on the beach and return on last Friday. Since Monday he developed fever and chills along with some cough. Patient completed his Covid vaccine with Pfizer about a month ago. Hospital Course:  Summary of his active problems in the hospital is as  following.   Acute hypoxic respiratory failure secondary to COVID-19 pneumonia. -Patient symptoms, labs and chest x-ray consistent with COVID-19 pneumonia. Patient did completehis vaccination with Pfizer approximately 1 month ago. Recent visit to beach where they were not using masks due to vaccination. CRP 21.7--20.1, - procalcitonin 0.14 - s/p IV remdesivir 3/14=>3/18 and Decadron 6 mg p.o. daily for total 10 days, -continue oxygen supplement to maintain saturation above 90%, and gradually wean off.  Continue Combivent inhaler every 6 hourly as needed, -Supportive care for cough, Robitussin as needed, continue incentive spirometry.  Continue Vitamin C and zinc supplement. -Patient was given azithromycin and ceftriaxone in ED for CAP. No indication for antibiotics at present  Hypertension.Blood pressure within goal.-Continue metoprolol Paroxysmal A. fib. currently in sinus rhythm. Continue metoprolol, flecainide and Eliquis. Chronic Parkinson's.Continue home dose of Sinemet. -Patient is independent at baseline. He uses walker. No recent falls. He had a recent trip to the beach also.  PT OT eval done, commendations noted and arrangement done.  Patient will be discharged to SNF. Type 2 diabetes.On Farxiga, Januvia and Metformin at home, which has been resumed on discharge.  Patient was given Levemir and Tradjenta during hospital stay to control blood glucose.  Continue to monitor FSBG and titrate dose accordingly.  May start NovoLog sliding scale if blood glucose is not under control with medications. HFrEF.Echocardiogram done in November 2021 with normal EF and grade 2 diastolic dysfunction. Currently well compensated. Patient appears euvolemic. Not on any diuretic at home. Generalized weakness with deconditioning, PT/OT eval done, recommended SNF placement and patient agreed with the discharge  planning. Acute delirium and agitation, possible secondary to COVID-19 infection versus  hypoxia 3/16 Seroquel 25 mg x 1 dose given 3/17 Ativan was given early morning around 3 AM 3/17 Haldol 1 dose given in the afternoon 3/17 started Seroquel 12.5 mg nightly Last night patient slept well and today he is not agitated or delirious.  AAO x3.  So plan is to discharge him to SNF. Body mass index is 25.39 kg/m.  Nutrition Interventions: No opiates for pain control prescribed - [facility] Controlled Substance Reporting System database was not reviewed. - Patient was instructed, not to drive, operate heavy machinery, perform activities at heights, swimming or participation in water activities or provide baby sitting services while on Pain, Sleep and Anxiety Medications; until his outpatient Physician has advised to do so again.  - Also recommended to not to take more than prescribed Pain, Sleep and Anxiety Medications.  Patient was seen by physical therapy, who recommended SNF, which was arranged. On the day of the discharge the patient's vitals were stable, and no other acute medical condition were reported by patient. the patient was felt safe to be discharge at SNF   Consultants: none Procedures: none  Discharge Exam: General: Appear in mild distress, no Rash; Oral Mucosa Clear, moist. Cardiovascular: S1 and S2 Present, audible murmur which is chronic as per patient Respiratory: mildly increased respiratory effort, Bilateral Air entry present and mild Crackles, no significant wheezes Abdomen: Bowel Sound present, Soft and no tenderness, no hernia Extremities: No significant pedal edema, no calf tenderness Neurology: alert and oriented to time, place, and person affect appropriate.  Filed Weights   09/18/19 1034  Weight: 78 kg   Vitals:   09/21/19 1600 09/22/19 0000  BP:  (!) 102/49  Pulse:  62  Resp:  (!) 21  Temp: 98.1 F (36.7 C) 97.9 F (36.6 C)  SpO2:  93%    DISCHARGE MEDICATION: Allergies as of 09/22/2019   No Known Allergies     Medication List      TAKE these medications   acetaminophen 325 MG tablet Commonly known as: TYLENOL Take 2 tablets (650 mg total) by mouth every 6 (six) hours as needed for mild pain or headache (fever >/= 101).   apixaban 5 MG Tabs tablet Commonly known as: Eliquis Take 1 tablet (5 mg total) by mouth 2 (two) times daily.   ascorbic acid 500 MG tablet Commonly known as: VITAMIN C Take 1 tablet (500 mg total) by mouth daily for 14 days. Start taking on: September 23, 2019   benzonatate 200 MG capsule Commonly known as: TESSALON Take 1 capsule (200 mg total) by mouth 3 (three) times daily as needed.   CALCIUM + D PO Take 1 tablet by mouth daily.   CEROVITE SENIOR PO Take 1 tablet by mouth daily.   dexamethasone 6 MG tablet Commonly known as: DECADRON Take 1 tablet (6 mg total) by mouth daily for 5 days.   Farxiga 10 MG Tabs tablet Generic drug: dapagliflozin propanediol Take 10 mg by mouth daily.   flecainide 50 MG tablet Commonly known as: TAMBOCOR Take 50 mg by mouth 2 (two) times daily.   glipiZIDE-metformin 5-500 MG tablet Commonly known as: METAGLIP Take 1 tablet by mouth 2 (two) times daily.   guaiFENesin-dextromethorphan 100-10 MG/5ML syrup Commonly known as: ROBITUSSIN DM Take 10 mLs by mouth every 6 (six) hours as needed for up to 10 days for cough.   Ipratropium-Albuterol 20-100 MCG/ACT Aers respimat Commonly known as: COMBIVENT Inhale  1 puff into the lungs every 6 (six) hours.   metoprolol succinate 50 MG 24 hr tablet Commonly known as: TOPROL-XL Take 50 mg by mouth daily.   pantoprazole 40 MG tablet Commonly known as: PROTONIX Take 1 tablet (40 mg total) by mouth 2 (two) times daily.   rosuvastatin 10 MG tablet Commonly known as: CRESTOR Take 1 tablet (10 mg total) by mouth daily.   Sinemet 25-100 MG tablet Generic drug: carbidopa-levodopa Take 1 tablet by mouth 4 (four) times daily.   sitaGLIPtin 100 MG tablet Commonly known as: Januvia Take 1 tablet (100 mg  total) by mouth daily.   UNABLE TO FIND Take 100 mg by mouth daily. CBD 100mg    zinc sulfate 220 (50 Zn) MG capsule Take 1 capsule (220 mg total) by mouth daily for 14 days. Start taking on: September 23, 2019      No Known Allergies Discharge Instructions    Call MD for:  difficulty breathing, headache or visual disturbances   Complete by: As directed    Call MD for:  persistant nausea and vomiting   Complete by: As directed    Call MD for:  severe uncontrolled pain   Complete by: As directed    Call MD for:  temperature >100.4   Complete by: As directed    Diet - low sodium heart healthy   Complete by: As directed    Discharge instructions   Complete by: As directed    Patient to be seen by MD in the facility in 1 to 2 days, monitor blood glucose closely as patient is on dexamethasone for few days more.  Continue incentive spirometry and gradually wean off supplemental oxygen   Increase activity slowly   Complete by: As directed       The results of significant diagnostics from this hospitalization (including imaging, microbiology, ancillary and laboratory) are listed below for reference.    Significant Diagnostic Studies: DG Chest Port 1 View  Result Date: 09/18/2019 CLINICAL DATA:  Dyspnea EXAM: PORTABLE CHEST 1 VIEW COMPARISON:  05/04/2018 chest radiograph. FINDINGS: Stable cardiomediastinal silhouette with normal heart size. No pneumothorax. No pleural effusion. Hazy patchy opacities throughout the peripheral lungs bilaterally, new. IMPRESSION: New hazy patchy opacities throughout the peripheral lungs bilaterally, suspicious for pneumonia. Chest radiograph follow-up advised. Electronically Signed   By: Ilona Sorrel M.D.   On: 09/18/2019 12:09    Microbiology: Recent Results (from the past 240 hour(s))  Blood Culture (routine x 2)     Status: None (Preliminary result)   Collection Time: 09/18/19 11:58 AM   Specimen: BLOOD  Result Value Ref Range Status   Specimen  Description BLOOD FOREARM  Final   Special Requests   Final    BOTTLES DRAWN AEROBIC AND ANAEROBIC Blood Culture adequate volume   Culture   Final    NO GROWTH 4 DAYS Performed at [facility], [address], [place], [place] [address]    Report Status PENDING  Incomplete  Blood Culture (routine x 2)     Status: None (Preliminary result)   Collection Time: 09/18/19 11:58 AM   Specimen: BLOOD  Result Value Ref Range Status   Specimen Description BLOOD BLOOD LEFT HAND  Final   Special Requests   Final    BOTTLES DRAWN AEROBIC AND ANAEROBIC Blood Culture adequate volume   Culture   Final    NO GROWTH 4 DAYS Performed at [facility], [address] [place], [place] [address]    Report  Status PENDING  Incomplete  Urine culture     Status: Abnormal   Collection Time: 09/18/19 11:58 AM   Specimen: Urine, Random  Result Value Ref Range Status   Specimen Description   Final    URINE, RANDOM Performed at [facility], [address] [place], [place] [address]    Special Requests   Final    NONE Performed at [facility], [address] [place], [place] [address]    Culture MULTIPLE SPECIES PRESENT, SUGGEST RECOLLECTION (A)  Final   Report Status 09/20/2019 FINAL  Final  Respiratory Panel by RT PCR (Flu A&B, Covid) - Nasopharyngeal Swab     Status: Abnormal   Collection Time: 09/20/19  5:55 PM   Specimen: Nasopharyngeal Swab  Result Value Ref Range Status   SARS Coronavirus 2 by RT PCR POSITIVE (A) NEGATIVE Final    Comment: RESULT CALLED TO, READ BACK BY AND VERIFIED WITH: JACKIE PAGE 09/20/19 1946 KLW (NOTE) SARS-CoV-2 target nucleic acids are DETECTED. SARS-CoV-2 RNA is generally detectable in upper respiratory specimens  during the acute phase of infection. Positive results are indicative of the presence of the identified virus, but do not rule out bacterial infection or co-infection with other pathogens not detected by the test.  Clinical correlation with patient history and other diagnostic information is necessary to determine patient infection status. The expected result is Negative. Fact Sheet for Patients:  PinkCheek.be Fact Sheet for Healthcare Providers: GravelBags.it This test is not yet approved or cleared by the [place] FDA and  has been authorized for detection and/or diagnosis of SARS-CoV-2 by FDA under an Emergency Use Authorization (EUA).  This EUA will remain in effect (meaning this test can be used) for the  duration of  the COVID-19 declaration under Section 564(b)(1) of the Act, 21 U.S.C. section 360bbb-3(b)(1), unless the authorization is terminated or revoked sooner.    Influenza A by PCR NEGATIVE NEGATIVE Final   Influenza B by PCR NEGATIVE NEGATIVE Final    Comment: (NOTE) The Xpert Xpress SARS-CoV-2/FLU/RSV assay is intended as an aid in  the diagnosis of influenza from Nasopharyngeal swab specimens and  should not be used as a sole basis for treatment. Nasal washings and  aspirates are unacceptable for Xpert Xpress SARS-CoV-2/FLU/RSV  testing. Fact Sheet for Patients: PinkCheek.be Fact Sheet for Healthcare Providers: GravelBags.it This test is not yet approved or cleared by the [place] FDA and  has been authorized for detection and/or diagnosis of SARS-CoV-2 by  FDA under an Emergency Use Authorization (EUA). This EUA will remain  in effect (meaning this test can be used) for the duration of the  Covid-19 declaration under Section 564(b)(1) of the Act, 21  U.S.C. section 360bbb-3(b)(1), unless the authorization is  terminated or revoked. Performed at [facility], [address] [place], [place] [address]      Labs: CBC: Recent Labs  Lab 09/18/19 1052 09/19/19 0602 09/20/19 0637 09/21/19 0454 09/22/19 0500  WBC 9.8 8.1 10.3 8.8 8.5    NEUTROABS 7.2 6.6 8.8* 7.1 6.9  HGB 10.2* 10.6* 11.1* 10.5* 11.4*  HCT 32.4* 33.8* 35.4* 32.0* 35.7*  MCV 89.0 87.8 87.8 85.8 86.9  PLT 206 240 306 297 [id number]   Basic Metabolic Panel: Recent Labs  Lab 09/18/19 1052 09/19/19 0602 09/20/19 0637 09/21/19 0454 09/22/19 0500  NA 134* 135 136 135 136  K 3.7 3.8 4.8 4.1 4.2  CL 101 102 102 103 104  CO2 21* 18* 23 23 20*  GLUCOSE 150* 180* 212* 265* 199*  BUN 17 24* 27* 31* 30*  CREATININE 1.23 1.07 1.05 1.14 1.12  CALCIUM 8.2* 8.2* 8.8* 8.2* 8.6*  MG  --  2.2 2.2 2.3 2.1  PHOS  --  3.4 3.3 3.1 4.0   Liver Function Tests: Recent Labs  Lab 09/18/19 1052 09/19/19 0602 09/20/19 0637 09/21/19 0454 09/22/19 0500  AST 39 35 35 29 25  ALT 24 8 10 10 8   ALKPHOS 92 92 83 74 71  BILITOT 0.9 0.9 0.7 0.8 0.9  PROT 7.1 6.9 7.0 6.5 6.7  ALBUMIN 3.4* 3.3* 3.2* 3.1* 3.2*   No results for input(s): LIPASE, AMYLASE in the last 168 hours. No results for input(s): AMMONIA in the last 168 hours. Cardiac Enzymes: No results for input(s): CKTOTAL, CKMB, CKMBINDEX, TROPONINI in the last 168 hours. BNP (last 3 results) No results for input(s): BNP in the last 8760 hours. CBG: Recent Labs  Lab 09/21/19 1620 09/21/19 2127 09/22/19 0756 09/22/19 0956 09/22/19 1136  GLUCAP 157* 112* 192* 270* 344*    Time spent: 35 minutes  Signed:  Val Riles  [facility]  09/22/2019 11:52 AM

## 2019-09-22 NOTE — TOC Transition Note (Signed)
Transition of Care [facility]) - CM/SW Discharge Note   Patient Details  Name: Timothy [name] MRN: [id number] Date of Birth: 20-Jun-1929  [facility] CM/SW Contact:  Elease Hashimoto, LCSW Phone Number: 09/22/2019, 12:18 PM   Clinical Narrative:  MD reports pt is medically ready for transfer to [facility] today. Have spoken with son-gerald and he will inform his sister's. Pt is aware and agreeable. [facility] waiting on room assignment. Bedside RN aware and will call report to [phone]. Pt hopes to just stay there a few weeks then get back home with his wife. Who also is has COVID and is being cared for by her son.  12:31 soft packet placed in chart. Going to [facility].   Final next level of care: Skilled Nursing Facility Barriers to Discharge: Barriers Resolved   Patient Goals and CMS Choice Patient states their goals for this hospitalization and ongoing recovery are:: I hope to go home, but wife is being tested today and Cindy-caregiver is positive also      Discharge Placement PASRR number recieved: 09/19/19            Patient chooses bed at: [facility] Patient to be transferred to facility by: EMS Name of family member notified: gerald-son Patient and family notified of of transfer: 09/22/19  Discharge Plan and Services In-house Referral: Clinical Social Work                                   Social Determinants of Health ([place]) Interventions     Readmission Risk Interventions No flowsheet data found.

## 2019-09-22 NOTE — Evaluation (Signed)
Occupational Therapy Evaluation Patient Details Name: Timothy [name] MRN: [id number] DOB: 19-Feb-1929 Today's Date: 09/22/2019    History of Present Illness Timothy [name] is an 73yoM who comes to [facility] on 3/14 c fever, chills, SOB. Pt out of town to beach last week, ill since return. Pt is s/p Pfizer vaccine, 2nd dose about 1 month ago. Pt complaining of malaise to the point that he was unable to get OOB. In ED pt febrile, hypoxic, labs (+) COVID19 infection. PMH: hypertension, type 2 diabetes, paroxysmal A. fib on Eliquis, HFpEF, PAD and Parkinson's disease.   Clinical Impression   Pt was seen for OT evaluation this date. Prior to hospital admission, pt was MOD I for fxl mobility with a Parkinson-specific upright walker and required MIN A for UB bathing/dressing, MAX A for LB bathing/dressing from PCA or his spouse. Pt lives in [facility] with his spouse with 7 STE with bilateral railing. Currently pt demonstrates impairments as described below (See OT problem list) which functionally limit his ability to perform ADL/self-care tasks. Pt currently requires MOD to MAX A for ADL transfer with RW and MAX A with LB ADLs.  Pt would benefit from skilled OT to address noted impairments and functional limitations (see below for any additional details) in order to maximize safety and independence while minimizing falls risk and caregiver burden. Upon hospital discharge, recommend STR to maximize pt safety and return to PLOF. Of note: pt desaturated to 84% on 2Lnc with extended time in standing initially. With seated rest break, in ~15-20 seconds, resumes to >90%. OT facilitates education re: PLB which appears to help oxygen saturation. For each subsequent stand, pt's tolerance appears to improve with less incidence of desaturation.    Follow Up Recommendations  SNF;Supervision - Intermittent(supv for all OOB activity)    Equipment Recommendations  3 in 1 bedside commode;Tub/shower seat    Recommendations for Other  Services       Precautions / Restrictions Precautions Precautions: Fall Restrictions Weight Bearing Restrictions: No      Mobility Bed Mobility Overal bed mobility: Needs Assistance Bed Mobility: Supine to Sit     Supine to sit: Min assist;HOB elevated     General bed mobility comments: use of bed rails, extended time  Transfers Overall transfer level: Needs assistance Equipment used: Rolling walker (2 wheeled) Transfers: Sit to/from [facility] Sit to Stand: Mod assist;From elevated surface(x3 trials from bed) Stand pivot transfers: Mod assist;Max assist(SPS to chair from bed, from chair to [facility], from [facility] to chair.)       General transfer comment: Pt able to transfer hands to RW with extended time and able to use while taking small shuffling steps to pivot towards chair. Requires physical assist to move walker with pivoting    Balance Overall balance assessment: Needs assistance Sitting-balance support: Feet supported;No upper extremity supported Sitting balance-Leahy Scale: Fair     Standing balance support: During functional activity;Bilateral upper extremity supported Standing balance-Leahy Scale: Poor Standing balance comment: Requires MOD A and B UE support on RW to maintain static standing balance and MOD/MAX A to pivot                           ADL either performed or assessed with clinical judgement   ADL Overall ADL's : Needs assistance/impaired Eating/Feeding: Set up;Minimal assistance;Sitting   Grooming: Wash/dry hands;Set up;Sitting Grooming Details (indicate cue type and reason): extended time and tactile cue to initiate Upper Body  Bathing: Minimal assistance;Sitting Upper Body Bathing Details (indicate cue type and reason): based on clinical obs Lower Body Bathing: Maximal assistance;Sit to/from stand Lower Body Bathing Details (indicate cue type and reason): based on clinical obs Upper Body Dressing : Minimal  assistance;Sitting   Lower Body Dressing: Maximal assistance;Sit to/from stand Lower Body Dressing Details (indicate cue type and reason): based on clinical obs Toilet Transfer: Moderate assistance;Stand-pivot;BSC;RW   Toileting- Clothing Manipulation and Hygiene: Moderate assistance;Maximal assistance;+2 for physical assistance;Sit to/from stand Toileting - Clothing Manipulation Details (indicate cue type and reason): 1 person MOD A with stand, 1 person MAX A with peri care as pt has to use UEs to maintain static standing balance.     Functional mobility during ADLs: Moderate assistance;Rolling walker(takes very small shuffling steps and requires hands on assist for weight shift/pivot.)       Vision Baseline Vision/History: Wears glasses Wears Glasses: At all times Patient Visual Report: No change from baseline       Perception     Praxis      Pertinent Vitals/Pain Pain Assessment: No/denies pain     Hand Dominance     Extremity/Trunk Assessment Upper Extremity Assessment Upper Extremity Assessment: Generalized weakness   Lower Extremity Assessment Lower Extremity Assessment: Generalized weakness       Communication Communication Communication: HOH   Cognition Arousal/Alertness: Awake/alert Behavior During Therapy: WFL for tasks assessed/performed;Flat affect Overall Cognitive Status: No family/caregiver present to determine baseline cognitive functioning                                 General Comments: Pt is alert and oriented to self, place, date, and situation, but pt does endorse some potential hallucinations and wonders if his illness is "messing with his mind". States he has seen people "having sex in my room" and also states he saw a "loose white calf running around" and asks if they caught it.   General Comments       Exercises Other Exercises Other Exercises: OT facilitates education with pt re: safety with ADL mobility including hand  placement with transfers. Other Exercises: OT facilitates education with pt re: role of OT in acute setting and potential need for f/u outside of acute setting. Pt agreeable. Other Exercises: OT facilitates education re: PLB technique to increase oxygenation.   Shoulder Instructions      Home Living Family/patient expects to be discharged to:: Private residence Living Arrangements: Spouse/significant other Available Help at Discharge: Available 24 hours/day;Personal care attendant(States PCA assists 7 days a week 3-4 hours a day.) Type of Home: House Home Access: Stairs to enter [employer] of Steps: 7 (last eval note said 5 steps) Entrance Stairs-Rails: Right;Left;Can reach both Home Layout: One level     Bathroom Shower/Tub: Occupational psychologist: Handicapped height     Home Equipment: Other (comment);Walker - 2 wheels;Wheelchair - manual(states "upright walker/upwalker" and describes propping forearms.)   Additional Comments: Typically uses a parkinson specific upright walker      Prior Functioning/Environment Level of Independence: Needs assistance  Gait / Transfers Assistance Needed: household AMB c up-walker ADL's / Homemaking Assistance Needed: states he has PCA that helps with bathing/dressing and sometimes transportation. States his wife transports him as well sometimes, and gets groceries.            OT Problem List: Decreased strength;Decreased activity tolerance;Impaired balance (sitting and/or standing);Decreased coordination;Cardiopulmonary status limiting activity  OT Treatment/Interventions: Self-care/ADL training;Therapeutic exercise;Energy conservation;DME and/or AE instruction;Therapeutic activities;Patient/family education;Balance training    OT Goals(Current goals can be found in the care plan section) Acute Rehab OT Goals Patient Stated Goal: regain strength, return to walking MOD I in home OT Goal Formulation: With  patient Time For Goal Achievement: 10/13/19 Potential to Achieve Goals: Good  OT Frequency: Min 2X/week   Barriers to D/C:            Co-evaluation              AM-PAC OT "6 Clicks" Daily Activity     Outcome Measure Help from another person eating meals?: A Little Help from another person taking care of personal grooming?: A Little Help from another person toileting, which includes using toliet, bedpan, or urinal?: Total Help from another person bathing (including washing, rinsing, drying)?: A Lot Help from another person to put on and taking off regular upper body clothing?: A Little Help from another person to put on and taking off regular lower body clothing?: A Lot 6 Click Score: 14   End of Session Equipment Utilized During Treatment: Gait belt;Rolling walker Nurse Communication: Mobility status  Activity Tolerance: Patient tolerated treatment well Patient left: in chair;with call bell/phone within reach;with chair alarm set  OT Visit Diagnosis: Unsteadiness on feet (R26.81);Muscle weakness (generalized) (M62.81)                Time: [id number] OT Time Calculation (min): 87 min Charges:  OT General Charges $OT Visit: 1 Visit OT Evaluation $OT Eval Moderate Complexity: 1 Mod OT Treatments $Self Care/Home Management : 38-52 mins $Therapeutic Activity: 23-37 mins  Gerrianne Scale, MS, OTR/L ascom [phone] 09/22/19, 11:16 AM

## 2019-09-23 DIAGNOSIS — R5381 Other malaise: Secondary | ICD-10-CM | POA: Diagnosis not present

## 2019-09-23 DIAGNOSIS — J1282 Pneumonia due to coronavirus disease 2019: Secondary | ICD-10-CM | POA: Diagnosis not present

## 2019-09-23 DIAGNOSIS — U071 COVID-19: Secondary | ICD-10-CM | POA: Diagnosis not present

## 2019-09-23 DIAGNOSIS — F05 Delirium due to known physiological condition: Secondary | ICD-10-CM | POA: Diagnosis not present

## 2019-09-23 LAB — CULTURE, BLOOD (ROUTINE X 2)
Culture: NO GROWTH
Culture: NO GROWTH
Special Requests: ADEQUATE
Special Requests: ADEQUATE

## 2019-09-24 ENCOUNTER — Other Ambulatory Visit: Payer: Self-pay | Admitting: Cardiovascular Disease

## 2019-09-26 DIAGNOSIS — U071 COVID-19: Secondary | ICD-10-CM | POA: Diagnosis not present

## 2019-09-26 DIAGNOSIS — J1282 Pneumonia due to coronavirus disease 2019: Secondary | ICD-10-CM | POA: Diagnosis not present

## 2019-09-26 DIAGNOSIS — I480 Paroxysmal atrial fibrillation: Secondary | ICD-10-CM | POA: Diagnosis not present

## 2019-09-26 DIAGNOSIS — G20 Parkinson's disease: Secondary | ICD-10-CM | POA: Diagnosis not present

## 2019-09-26 NOTE — Telephone Encounter (Signed)
Can you please confirm if it is okay to refill Flecainide under Dr. Rockey Situ.  It looks like he has filled it in the past but now on patients medication list it states Historical Med/Historical Provider.

## 2019-09-28 DIAGNOSIS — J1282 Pneumonia due to coronavirus disease 2019: Secondary | ICD-10-CM | POA: Diagnosis not present

## 2019-09-28 DIAGNOSIS — E119 Type 2 diabetes mellitus without complications: Secondary | ICD-10-CM | POA: Diagnosis not present

## 2019-09-28 DIAGNOSIS — U071 COVID-19: Secondary | ICD-10-CM | POA: Diagnosis not present

## 2019-09-28 DIAGNOSIS — D649 Anemia, unspecified: Secondary | ICD-10-CM | POA: Diagnosis not present

## 2019-09-29 ENCOUNTER — Other Ambulatory Visit: Payer: Self-pay | Admitting: *Deleted

## 2019-09-29 NOTE — Patient Outreach (Signed)
Screened for potential [facility] Care Management needs as a benefit of  [employer] Medicare  Timothy [name] is currently receiving skilled therapy at [facility].  Writer attended telephonic interdisciplinary team meeting to assess for disposition needs and transition plan for resident.   Facility reports member is from home with wife and caregiver assistance. Member is currently in covid isolation in the facility. He is participating with therapy and is currently on oxygen. They are closely monitoring his 02 sats.   Noted member's PCP is Dr. Jeananne Rama with [facility]. This practice has [place] Management team. Will continue to follow member while he resides in SNF. Will plan outreach to wife as appropriate regarding [facility] follow up. Will plan outreach to [place] team.   Marthenia Rolling, MSN-Ed, RN,BSN [place] Acute Care Coordinator [phone] [facility]) [phone]  (Toll free office)

## 2019-10-03 DIAGNOSIS — F4320 Adjustment disorder, unspecified: Secondary | ICD-10-CM | POA: Diagnosis not present

## 2019-10-03 DIAGNOSIS — F419 Anxiety disorder, unspecified: Secondary | ICD-10-CM | POA: Diagnosis not present

## 2019-10-07 DIAGNOSIS — R040 Epistaxis: Secondary | ICD-10-CM | POA: Diagnosis not present

## 2019-10-07 DIAGNOSIS — E119 Type 2 diabetes mellitus without complications: Secondary | ICD-10-CM | POA: Diagnosis not present

## 2019-10-10 DIAGNOSIS — F4321 Adjustment disorder with depressed mood: Secondary | ICD-10-CM | POA: Diagnosis not present

## 2019-10-10 DIAGNOSIS — F419 Anxiety disorder, unspecified: Secondary | ICD-10-CM | POA: Diagnosis not present

## 2019-10-14 ENCOUNTER — Other Ambulatory Visit: Payer: Self-pay | Admitting: *Deleted

## 2019-10-14 NOTE — Patient Outreach (Signed)
Late entry for 10/13/19  Screened for potential [facility] Care Management needs as a benefit of  [employer] Medicare.  Mr. Bunce is receiving skilled therapy at [facility].   Writer attended telephonic interdisciplinary team meeting to assess for disposition needs and transition plan for resident.  Facility reports member continues with oxygen. Anticipated transition plan is to return home with wife and caregivers. Care plan meeting scheduled for Monday with daughter Ma Hillock. Facility reports member is adamant to return home.   Marthenia Rolling, MSN-Ed, RN,BSN [place] Acute Care Coordinator [phone] [facility]) [phone]  (Toll free office)

## 2019-10-17 ENCOUNTER — Other Ambulatory Visit: Payer: Self-pay | Admitting: *Deleted

## 2019-10-17 NOTE — Patient Outreach (Signed)
[facility] Post-Acute Care Coordinator follow up  Mr. Haxton is currently receiving skilled therapy at [facility].   Facility SW previously reported they have been discussing disposition plans with [employer]. Telephone call made to son Berneta Sages [phone]. Patient identifiers confirmed.   Berneta Sages reports that he is POA but his sisters [name] and Verdene Lennert are Medical POAs. Advised that Dentist either [place] or [place].  Member's wife has been recovering from [place] as well.   Will follow up at later time regarding transition plans and [facility] follow up. Will communicate with [facility] ECM team to see if member is still being followed at [facility].   Marthenia Rolling, MSN-Ed, RN,BSN [place] Acute Care Coordinator [phone] [facility]) [phone]  (Toll free office)

## 2019-10-18 DIAGNOSIS — F05 Delirium due to known physiological condition: Secondary | ICD-10-CM | POA: Diagnosis not present

## 2019-10-18 DIAGNOSIS — U071 COVID-19: Secondary | ICD-10-CM | POA: Diagnosis not present

## 2019-10-20 ENCOUNTER — Other Ambulatory Visit: Payer: Self-pay | Admitting: *Deleted

## 2019-10-20 DIAGNOSIS — U071 COVID-19: Secondary | ICD-10-CM | POA: Diagnosis not present

## 2019-10-20 DIAGNOSIS — J1282 Pneumonia due to coronavirus disease 2019: Secondary | ICD-10-CM | POA: Diagnosis not present

## 2019-10-20 DIAGNOSIS — I110 Hypertensive heart disease with heart failure: Secondary | ICD-10-CM | POA: Diagnosis not present

## 2019-10-20 DIAGNOSIS — I5032 Chronic diastolic (congestive) heart failure: Secondary | ICD-10-CM | POA: Diagnosis not present

## 2019-10-20 NOTE — Patient Outreach (Signed)
[place] Coordinator follow up  Mr. Stec remains at [facility].   Telephone call made to daughter/DPR/HCPOA [facility] on both numbers [phone] and [phone]. No answer. Left HIPAA compliant voicemail message requesting call back.   Calling to discuss transition plans and potential [facility] needs. Need to confirm PCP. However, listed PCP is Dr. Jeananne Rama who has [place] Management team.    Marthenia Rolling, MSN-Ed, RN,BSN [place] Acute Care Coordinator [phone] [facility]) [phone]  (Toll free office)

## 2019-10-24 DIAGNOSIS — F4321 Adjustment disorder with depressed mood: Secondary | ICD-10-CM | POA: Diagnosis not present

## 2019-10-24 DIAGNOSIS — F419 Anxiety disorder, unspecified: Secondary | ICD-10-CM | POA: Diagnosis not present

## 2019-10-25 ENCOUNTER — Other Ambulatory Visit: Payer: Self-pay | Admitting: *Deleted

## 2019-10-25 NOTE — Patient Outreach (Signed)
[facility] Post- Acute Care Coordinator follow up  Mr. Counihan remains in [facility] SN[facility] for skilled therapy.   Telephone call made to [facility] daughter Ma Hillock (daughter/DPR/co-HCPOA) [phone] on 3-way call with (son/DPR) Berneta Sages [phone]. Patient identifiers confirmed.   Berneta Sages indicates he is trying to reach [place] [facility] to arrange paid caregiver assistance. States member's wife Bonnita Nasuti uses [facility] currently. States Bonnita Nasuti (member's wife) has dementia.   Verdene Lennert reports Mr. Colmenero is anxious to return home. States "he is miserable" in the facility and that he misses his wife terribly. Verdene Lennert also affirms they are unable to tell how member is actually doing. States he has declined and she is not sure if it's deterioration from COVID and/or Parkinson's or both.   Verdene Lennert states they want to honor member's wishes to bring him home but want to do it safely.   Discussed whether they would be interested in palliative care referral for ongoing goals of care discussions. Verdene Lennert states she is not sure if they are want hospice right now but are agreeable to palliative care referral to [facility].   Also discussed that Probation officer will outreach to [facility] team to make aware of family concerns and pending palliative referral. Discussed that Probation officer will also outreach to [facility] to make aware family is eager to speak with them.  Update sent to [facility] team at [facility]. [facility] referral sent for outpatient palliative care upon SNF dc, and communication sent to [facility].   Will continue to follow while member resides in SNF.    Marthenia Rolling, MSN-Ed, RN,BSN [place] Acute Care Coordinator [phone] [facility]) [phone]  (Toll free office)

## 2019-10-28 ENCOUNTER — Other Ambulatory Visit: Payer: Self-pay | Admitting: *Deleted

## 2019-10-28 DIAGNOSIS — E119 Type 2 diabetes mellitus without complications: Secondary | ICD-10-CM | POA: Diagnosis not present

## 2019-10-28 NOTE — Patient Outreach (Signed)
Late entry for 10/27/19  Screened for potential [facility] Care Management needs as a benefit of  [employer] Medicare.  Mr. Pinal is currently receiving skilled therapy at [facility].  Writer attended telephonic interdisciplinary team meeting to assess for disposition needs and transition plan for resident.   Facility states they have spoken to all 5 of Mr. Blee children. State the family wants member to go to ALF rather than return home with caregiver assistance. However, unsure if Mr. Grap will agree. Facility is working on weaning continuous oxygen. Family to have video visit training early next week.   Will continue to follow while member resides in SNF.   Marthenia Rolling, MSN-Ed, RN,BSN [place] Acute Care Coordinator [phone] [facility]) [phone]  (Toll free office)

## 2019-10-31 DIAGNOSIS — F419 Anxiety disorder, unspecified: Secondary | ICD-10-CM | POA: Diagnosis not present

## 2019-10-31 DIAGNOSIS — F4321 Adjustment disorder with depressed mood: Secondary | ICD-10-CM | POA: Diagnosis not present

## 2019-11-02 DIAGNOSIS — E119 Type 2 diabetes mellitus without complications: Secondary | ICD-10-CM | POA: Diagnosis not present

## 2019-11-07 ENCOUNTER — Ambulatory Visit: Payer: Medicare Other | Admitting: Dermatology

## 2019-11-07 DIAGNOSIS — F419 Anxiety disorder, unspecified: Secondary | ICD-10-CM | POA: Diagnosis not present

## 2019-11-07 DIAGNOSIS — F4321 Adjustment disorder with depressed mood: Secondary | ICD-10-CM | POA: Diagnosis not present

## 2019-11-08 DIAGNOSIS — J1282 Pneumonia due to coronavirus disease 2019: Secondary | ICD-10-CM | POA: Diagnosis not present

## 2019-11-08 DIAGNOSIS — U071 COVID-19: Secondary | ICD-10-CM | POA: Diagnosis not present

## 2019-11-10 ENCOUNTER — Telehealth: Payer: Self-pay | Admitting: Nurse Practitioner

## 2019-11-10 NOTE — Telephone Encounter (Signed)
Copied from JAARS [id number]. Topic: General - Other >> Nov 10, 2019 10:13 AM Oneta Rack wrote: Caller name: Jenny Reichmann  Relation to pt: patient private nurse   Call back number:[phone]   Reason for call: patint currently at [facility]. Rehab states patient can be discharged sooner if he has an oxygen tank at home. Patient requesting home oxygen orders, please advise

## 2019-11-10 NOTE — Telephone Encounter (Signed)
Is this something that we can do, we have not seen him since 12/2018, and that was by Dr.Crissman.

## 2019-11-10 NOTE — Telephone Encounter (Signed)
Jenny Reichmann returning call for Tiffany. Please advise.

## 2019-11-10 NOTE — Telephone Encounter (Signed)
No we could not do that.  He would need a pulmonary work up based on past papers I have received for O2 orders.  I would let Timothy [name] know to ask the attending NP in house to write for a pulmonary referral outpatient to the [facility] group in [place] ASAP and maybe get home health involved as well.  Timothy [name] used to be one of my buildings with [employer], so I know they do have provider in house he could get things rolling before his discharge.

## 2019-11-10 NOTE — Telephone Encounter (Signed)
Nurse notified of Jolenes response.

## 2019-11-10 NOTE — Telephone Encounter (Signed)
Called and left a message for Jenny Reichmann to return my call.

## 2019-11-14 DIAGNOSIS — R4189 Other symptoms and signs involving cognitive functions and awareness: Secondary | ICD-10-CM | POA: Diagnosis not present

## 2019-11-14 DIAGNOSIS — G20 Parkinson's disease: Secondary | ICD-10-CM | POA: Diagnosis not present

## 2019-11-14 DIAGNOSIS — F4321 Adjustment disorder with depressed mood: Secondary | ICD-10-CM | POA: Diagnosis not present

## 2019-11-14 DIAGNOSIS — J1282 Pneumonia due to coronavirus disease 2019: Secondary | ICD-10-CM | POA: Diagnosis not present

## 2019-11-14 DIAGNOSIS — F419 Anxiety disorder, unspecified: Secondary | ICD-10-CM | POA: Diagnosis not present

## 2019-11-14 DIAGNOSIS — U071 COVID-19: Secondary | ICD-10-CM | POA: Diagnosis not present

## 2019-11-14 DIAGNOSIS — I110 Hypertensive heart disease with heart failure: Secondary | ICD-10-CM | POA: Diagnosis not present

## 2019-11-15 DIAGNOSIS — I5032 Chronic diastolic (congestive) heart failure: Secondary | ICD-10-CM | POA: Diagnosis not present

## 2019-11-15 DIAGNOSIS — J1282 Pneumonia due to coronavirus disease 2019: Secondary | ICD-10-CM | POA: Diagnosis not present

## 2019-11-15 DIAGNOSIS — I110 Hypertensive heart disease with heart failure: Secondary | ICD-10-CM | POA: Diagnosis not present

## 2019-11-15 DIAGNOSIS — U071 COVID-19: Secondary | ICD-10-CM | POA: Diagnosis not present

## 2019-11-16 DIAGNOSIS — R2689 Other abnormalities of gait and mobility: Secondary | ICD-10-CM | POA: Diagnosis not present

## 2019-11-16 DIAGNOSIS — R2681 Unsteadiness on feet: Secondary | ICD-10-CM | POA: Diagnosis not present

## 2019-11-16 DIAGNOSIS — M6281 Muscle weakness (generalized): Secondary | ICD-10-CM | POA: Diagnosis not present

## 2019-11-16 DIAGNOSIS — R0689 Other abnormalities of breathing: Secondary | ICD-10-CM | POA: Diagnosis not present

## 2019-11-16 DIAGNOSIS — G20 Parkinson's disease: Secondary | ICD-10-CM | POA: Diagnosis not present

## 2019-11-16 DIAGNOSIS — R1312 Dysphagia, oropharyngeal phase: Secondary | ICD-10-CM | POA: Diagnosis not present

## 2019-11-17 ENCOUNTER — Other Ambulatory Visit: Payer: Self-pay | Admitting: Family Medicine

## 2019-11-17 ENCOUNTER — Other Ambulatory Visit: Payer: Self-pay | Admitting: Nurse Practitioner

## 2019-11-17 DIAGNOSIS — G20 Parkinson's disease: Secondary | ICD-10-CM | POA: Diagnosis not present

## 2019-11-17 DIAGNOSIS — R2681 Unsteadiness on feet: Secondary | ICD-10-CM | POA: Diagnosis not present

## 2019-11-17 DIAGNOSIS — R0689 Other abnormalities of breathing: Secondary | ICD-10-CM | POA: Diagnosis not present

## 2019-11-17 DIAGNOSIS — R2689 Other abnormalities of gait and mobility: Secondary | ICD-10-CM | POA: Diagnosis not present

## 2019-11-17 DIAGNOSIS — M6281 Muscle weakness (generalized): Secondary | ICD-10-CM | POA: Diagnosis not present

## 2019-11-17 DIAGNOSIS — R1312 Dysphagia, oropharyngeal phase: Secondary | ICD-10-CM | POA: Diagnosis not present

## 2019-11-17 MED ORDER — SERTRALINE HCL 50 MG PO TABS: 50.0000 mg | ORAL_TABLET | Freq: Every day | ORAL | 4 refills | Status: DC

## 2019-11-17 NOTE — Telephone Encounter (Signed)
LOV: 12/16/2018 with Golden Pop, MD.

## 2019-11-17 NOTE — Telephone Encounter (Signed)
Requested medication (s) are due for refill today: n/a  Requested medication (s) are on the active medication list: No  Last refill:  n/a  Future visit scheduled: Yes  Notes to clinic:  Medication is not on medication list.    Requested Prescriptions  Pending Prescriptions Disp Refills   sertraline (ZOLOFT) 50 MG tablet [Pharmacy Med Name: SERTRALINE HCL 50 MG TAB] 30 tablet     Sig: TAKE ONE TABLET BY [place]      Psychiatry:  Antidepressants - SSRI Failed - 11/17/2019  9:16 AM      Failed - Valid encounter within last 6 months    Recent Outpatient Visits           11 months ago Type 2 diabetes mellitus with other circulatory complication, without long-term current use of insulin ([place])   [facility] Crissman, Jeannette How, MD   1 year ago Essential hypertension   [facility] Crissman, Jeannette How, MD   1 year ago Essential hypertension   [place], Jeannette How, MD   1 year ago Hospital discharge follow-up   [facility] Trinna Post, PA-C   1 year ago Essential hypertension   [place], Jeannette How, MD       Future Appointments             In 6 days Orene Desanctis, Lilia Argue, [place], [place]

## 2019-11-18 DIAGNOSIS — M6281 Muscle weakness (generalized): Secondary | ICD-10-CM | POA: Diagnosis not present

## 2019-11-18 DIAGNOSIS — G20 Parkinson's disease: Secondary | ICD-10-CM | POA: Diagnosis not present

## 2019-11-18 DIAGNOSIS — R2681 Unsteadiness on feet: Secondary | ICD-10-CM | POA: Diagnosis not present

## 2019-11-18 DIAGNOSIS — R0689 Other abnormalities of breathing: Secondary | ICD-10-CM | POA: Diagnosis not present

## 2019-11-18 DIAGNOSIS — R1312 Dysphagia, oropharyngeal phase: Secondary | ICD-10-CM | POA: Diagnosis not present

## 2019-11-18 DIAGNOSIS — R2689 Other abnormalities of gait and mobility: Secondary | ICD-10-CM | POA: Diagnosis not present

## 2019-11-21 ENCOUNTER — Other Ambulatory Visit: Payer: Self-pay | Admitting: *Deleted

## 2019-11-21 DIAGNOSIS — R1312 Dysphagia, oropharyngeal phase: Secondary | ICD-10-CM | POA: Diagnosis not present

## 2019-11-21 DIAGNOSIS — R0689 Other abnormalities of breathing: Secondary | ICD-10-CM | POA: Diagnosis not present

## 2019-11-21 DIAGNOSIS — G20 Parkinson's disease: Secondary | ICD-10-CM | POA: Diagnosis not present

## 2019-11-21 DIAGNOSIS — R2689 Other abnormalities of gait and mobility: Secondary | ICD-10-CM | POA: Diagnosis not present

## 2019-11-21 DIAGNOSIS — R2681 Unsteadiness on feet: Secondary | ICD-10-CM | POA: Diagnosis not present

## 2019-11-21 DIAGNOSIS — M6281 Muscle weakness (generalized): Secondary | ICD-10-CM | POA: Diagnosis not present

## 2019-11-21 NOTE — Patient Outreach (Signed)
[facility]  Post- Acute Care Coordinator follow up.  Received notification from [place] indicating Mr. Hammond transitioned to [facility] located in [place], [place].  Will update [facility] palliative of member's transition. Will also update [facility] ECM team.    Marthenia Rolling, MSN-Ed, RN,BSN [place] Acute Care Coordinator [phone] [facility]) [phone]  (Toll free office)

## 2019-11-22 DIAGNOSIS — M6281 Muscle weakness (generalized): Secondary | ICD-10-CM | POA: Diagnosis not present

## 2019-11-22 DIAGNOSIS — R1312 Dysphagia, oropharyngeal phase: Secondary | ICD-10-CM | POA: Diagnosis not present

## 2019-11-22 DIAGNOSIS — G20 Parkinson's disease: Secondary | ICD-10-CM | POA: Diagnosis not present

## 2019-11-22 DIAGNOSIS — R2681 Unsteadiness on feet: Secondary | ICD-10-CM | POA: Diagnosis not present

## 2019-11-22 DIAGNOSIS — R2689 Other abnormalities of gait and mobility: Secondary | ICD-10-CM | POA: Diagnosis not present

## 2019-11-22 DIAGNOSIS — R0689 Other abnormalities of breathing: Secondary | ICD-10-CM | POA: Diagnosis not present

## 2019-11-23 ENCOUNTER — Inpatient Hospital Stay: Payer: Medicare Other | Admitting: Family Medicine

## 2019-11-23 DIAGNOSIS — R0689 Other abnormalities of breathing: Secondary | ICD-10-CM | POA: Diagnosis not present

## 2019-11-23 DIAGNOSIS — R2681 Unsteadiness on feet: Secondary | ICD-10-CM | POA: Diagnosis not present

## 2019-11-23 DIAGNOSIS — G20 Parkinson's disease: Secondary | ICD-10-CM | POA: Diagnosis not present

## 2019-11-23 DIAGNOSIS — R1312 Dysphagia, oropharyngeal phase: Secondary | ICD-10-CM | POA: Diagnosis not present

## 2019-11-23 DIAGNOSIS — M6281 Muscle weakness (generalized): Secondary | ICD-10-CM | POA: Diagnosis not present

## 2019-11-23 DIAGNOSIS — R2689 Other abnormalities of gait and mobility: Secondary | ICD-10-CM | POA: Diagnosis not present

## 2019-11-24 DIAGNOSIS — R2689 Other abnormalities of gait and mobility: Secondary | ICD-10-CM | POA: Diagnosis not present

## 2019-11-24 DIAGNOSIS — R2681 Unsteadiness on feet: Secondary | ICD-10-CM | POA: Diagnosis not present

## 2019-11-24 DIAGNOSIS — R0689 Other abnormalities of breathing: Secondary | ICD-10-CM | POA: Diagnosis not present

## 2019-11-24 DIAGNOSIS — R1312 Dysphagia, oropharyngeal phase: Secondary | ICD-10-CM | POA: Diagnosis not present

## 2019-11-24 DIAGNOSIS — M6281 Muscle weakness (generalized): Secondary | ICD-10-CM | POA: Diagnosis not present

## 2019-11-24 DIAGNOSIS — G20 Parkinson's disease: Secondary | ICD-10-CM | POA: Diagnosis not present

## 2019-11-25 DIAGNOSIS — R0689 Other abnormalities of breathing: Secondary | ICD-10-CM | POA: Diagnosis not present

## 2019-11-25 DIAGNOSIS — G20 Parkinson's disease: Secondary | ICD-10-CM | POA: Diagnosis not present

## 2019-11-25 DIAGNOSIS — R2689 Other abnormalities of gait and mobility: Secondary | ICD-10-CM | POA: Diagnosis not present

## 2019-11-25 DIAGNOSIS — R2681 Unsteadiness on feet: Secondary | ICD-10-CM | POA: Diagnosis not present

## 2019-11-25 DIAGNOSIS — M6281 Muscle weakness (generalized): Secondary | ICD-10-CM | POA: Diagnosis not present

## 2019-11-25 DIAGNOSIS — R1312 Dysphagia, oropharyngeal phase: Secondary | ICD-10-CM | POA: Diagnosis not present

## 2019-11-28 ENCOUNTER — Telehealth: Payer: Self-pay | Admitting: Nurse Practitioner

## 2019-11-28 ENCOUNTER — Other Ambulatory Visit: Payer: Self-pay | Admitting: Nurse Practitioner

## 2019-11-28 DIAGNOSIS — R2689 Other abnormalities of gait and mobility: Secondary | ICD-10-CM | POA: Diagnosis not present

## 2019-11-28 DIAGNOSIS — R2681 Unsteadiness on feet: Secondary | ICD-10-CM | POA: Diagnosis not present

## 2019-11-28 DIAGNOSIS — E119 Type 2 diabetes mellitus without complications: Secondary | ICD-10-CM

## 2019-11-28 DIAGNOSIS — R1312 Dysphagia, oropharyngeal phase: Secondary | ICD-10-CM | POA: Diagnosis not present

## 2019-11-28 DIAGNOSIS — G20 Parkinson's disease: Secondary | ICD-10-CM | POA: Diagnosis not present

## 2019-11-28 DIAGNOSIS — I5032 Chronic diastolic (congestive) heart failure: Secondary | ICD-10-CM

## 2019-11-28 DIAGNOSIS — I10 Essential (primary) hypertension: Secondary | ICD-10-CM

## 2019-11-28 DIAGNOSIS — U071 COVID-19: Secondary | ICD-10-CM

## 2019-11-28 DIAGNOSIS — J1282 Pneumonia due to coronavirus disease 2019: Secondary | ICD-10-CM

## 2019-11-28 DIAGNOSIS — R0689 Other abnormalities of breathing: Secondary | ICD-10-CM | POA: Diagnosis not present

## 2019-11-28 DIAGNOSIS — M6281 Muscle weakness (generalized): Secondary | ICD-10-CM | POA: Diagnosis not present

## 2019-11-28 NOTE — Telephone Encounter (Signed)
Noted  

## 2019-11-28 NOTE — Telephone Encounter (Signed)
Have placed referral for palliative, please alert them we were not aware of this need and placed referral today.

## 2019-11-28 NOTE — Telephone Encounter (Signed)
Copied from [place] [id number]. Topic: General - Other >> Nov 28, 2019 11:44 AM Oneta Rack wrote: Osvaldo Human name: Langley Gauss  Relation to pt : from [place]  Call back number: [phone] option # [phone] fax # [phone]   Reason for call:  Checking on the status of palliative care orders. Representative called on 5/18  (chart doesnt reflect), please advise

## 2019-11-28 NOTE — Telephone Encounter (Signed)
Called [facility] and advised that referral has been placed spoke with stacy

## 2019-11-29 ENCOUNTER — Telehealth: Payer: Medicare Other | Admitting: General Practice

## 2019-11-29 ENCOUNTER — Ambulatory Visit (INDEPENDENT_AMBULATORY_CARE_PROVIDER_SITE_OTHER): Payer: Medicare Other | Admitting: General Practice

## 2019-11-29 DIAGNOSIS — Z7189 Other specified counseling: Secondary | ICD-10-CM

## 2019-11-29 DIAGNOSIS — E785 Hyperlipidemia, unspecified: Secondary | ICD-10-CM

## 2019-11-29 DIAGNOSIS — R0689 Other abnormalities of breathing: Secondary | ICD-10-CM | POA: Diagnosis not present

## 2019-11-29 DIAGNOSIS — R2681 Unsteadiness on feet: Secondary | ICD-10-CM | POA: Diagnosis not present

## 2019-11-29 DIAGNOSIS — G20 Parkinson's disease: Secondary | ICD-10-CM | POA: Diagnosis not present

## 2019-11-29 DIAGNOSIS — I10 Essential (primary) hypertension: Secondary | ICD-10-CM | POA: Diagnosis not present

## 2019-11-29 DIAGNOSIS — I5032 Chronic diastolic (congestive) heart failure: Secondary | ICD-10-CM

## 2019-11-29 DIAGNOSIS — E119 Type 2 diabetes mellitus without complications: Secondary | ICD-10-CM

## 2019-11-29 DIAGNOSIS — R2689 Other abnormalities of gait and mobility: Secondary | ICD-10-CM | POA: Diagnosis not present

## 2019-11-29 DIAGNOSIS — R1312 Dysphagia, oropharyngeal phase: Secondary | ICD-10-CM | POA: Diagnosis not present

## 2019-11-29 DIAGNOSIS — M6281 Muscle weakness (generalized): Secondary | ICD-10-CM | POA: Diagnosis not present

## 2019-11-29 NOTE — Chronic Care Management (AMB) (Signed)
Chronic Care Management   Initial Visit Note  11/29/2019 Name: Timothy [name] MRN: [id number] DOB: [birth year]  Referred by: Timothy Maple, MD Reason for referral : Chronic Care Management (Initial: RNCM: Chronic Disease Management and Care Coordination )   Timothy [name] is a 86 y.o. year old male who is a primary care patient of Crissman, Jeannette How, MD. The CCM team was consulted for assistance with chronic disease management and care coordination needs related to Atrial Fibrillation, CHF, HTN, HLD, DMII and Parkinson's Disease  Review of patient status, including review of consultants reports, relevant laboratory and other test results, and collaboration with appropriate care team members and the patient's provider was performed as part of comprehensive patient evaluation and provision of chronic care management services.    SDOH (Social Determinants of Health) assessments performed: Yes- housing  See Care Plan activities for detailed interventions related to SDOH     Medications: Outpatient Encounter Medications as of 11/29/2019  Medication Sig  . acetaminophen (TYLENOL) 325 MG tablet Take 2 tablets (650 mg total) by mouth every 6 (six) hours as needed for mild pain or headache (fever >/= 101).  Marland Kitchen apixaban (ELIQUIS) 5 MG TABS tablet Take 1 tablet (5 mg total) by mouth 2 (two) times daily.  . benzonatate (TESSALON) 200 MG capsule Take 1 capsule (200 mg total) by mouth 3 (three) times daily as needed.  . Calcium Citrate-Vitamin D (CALCIUM + D PO) Take 1 tablet by mouth daily.   . carbidopa-levodopa (SINEMET) 25-100 MG tablet Take 1 tablet by mouth 4 (four) times daily.   . dapagliflozin propanediol (FARXIGA) 10 MG TABS tablet Take 10 mg by mouth daily.  . flecainide (TAMBOCOR) 50 MG tablet TAKE 1 TABLET BY MOUTH TWICE DAILY  . glipiZIDE-metformin (METAGLIP) 5-500 MG tablet Take 1 tablet by mouth 2 (two) times daily.  . Ipratropium-Albuterol (COMBIVENT) 20-100 MCG/ACT AERS respimat Inhale 1  puff into the lungs every 6 (six) hours.  . metoprolol succinate (TOPROL-XL) 50 MG 24 hr tablet Take 50 mg by mouth daily.  . Multiple Vitamins-Minerals (CEROVITE SENIOR PO) Take 1 tablet by mouth daily.  . pantoprazole (PROTONIX) 40 MG tablet Take 1 tablet (40 mg total) by mouth 2 (two) times daily.  . rosuvastatin (CRESTOR) 10 MG tablet Take 1 tablet (10 mg total) by mouth daily.  . sertraline (ZOLOFT) 50 MG tablet Take 1 tablet (50 mg total) by mouth daily.  . sitaGLIPtin (JANUVIA) 100 MG tablet Take 1 tablet (100 mg total) by mouth daily.  Marland Kitchen UNABLE TO FIND Take 100 mg by mouth daily. CBD 182m   No facility-administered encounter medications on file as of 11/29/2019.     Objective:  BP Readings from Last 3 Encounters:  09/22/19 (!) 102/49  09/14/19 115/67  06/10/19 124/76   Lab Results  Component Value Date   HGBA1C 7.6 (H) 09/18/2019    Goals Addressed            This Visit's Progress   . RNCM: Pt's daughter- "Dad is now living at [facility] (pt-stated)       CARE PLAN ENTRY (see longtitudinal plan of care for additional care plan information)  Current Barriers:  . Chronic Disease Management support, education, and care coordination needs related to Atrial Fibrillation, CHF, HTN, HLD, DMII, and Parkinson's Disease  Clinical Goal(s) related toAtrial Fibrillation, CHF, HTN, HLD, DMII, and Parkinson's Disease:  Over the next 120 days, patient will:  . Work with the care  management team to address educational, disease management, and care coordination needs  . Begin or continue self health monitoring activities as directed today Measure and record cbg (blood glucose) 1 times daily, Measure and record blood pressure 4 times per week, Measure and record weight daily, and adhere to a heart healthy/ADA diet per the facility policy. The patient is residing at [facility] . Call provider office for new or worsened signs  and symptoms Blood glucose findings outside established parameters, Blood pressure findings outside established parameters, Weight outside established parameters, Oxygen saturation lower than established parameter, Shortness of breath, and New or worsened symptom related to Chronic health conditions . Call care management team with questions or concerns . Verbalize basic understanding of patient centered plan of care established today  Interventions related to Atrial Fibrillation, CHF, HTN, HLD, DMII, and Parkinson's Disease:  . Evaluation of current treatment plans and patient's adherence to plan as established by provider.  Spoke to the patients daughter Timothy [name] concerning the patients transition from SNF to an independent living facility. Timothy [name] states he has been there 3 weeks and is getting acclimated to his new home. The children can go and see him whenever they want and her sister has been managing his medications. They have pill packaging set up and he is doing pretty well with this; however he does like to play cards with the other residents and sometimes forgets to take his medications.  . Assessed patient understanding of disease states.  The patient is being monitored at the facility. VS per facility protocol.  . Assessed patient's education and care coordination needs. Review of education needs. Per the daughter she has not heard from Timothy [name]. The referral for palliative was made yesterday. This may be a good service for the patient to have since he is at an Independent care facility versus being at home. Call made to [facility], CM with Timothy [name]. Discussion and collaboration on coordination of care.  Timothy [name] has been working with the patient and patients family before current transition to [facility].  . Provided disease specific education to patient.  Advised patients daughter to call the [facility] for needed assistance or education.  Timothy [name] with appropriate clinical care team members  regarding patient needs.  Review of CCM service and the availability of CCM team pharmacist and LCSW to help with patient needs. Education on the role of the CCM team and working with [employer] to meet the patients health and wellness needs.   Patient Self Care Activities related to Atrial Fibrillation, CHF, HTN, HLD, DMII, and Parkinson's Disease . Patient is unable to independently self-manage chronic health conditions  Initial goal documentation         Mr. Stearns was given information about Chronic Care Management services today including:  1. CCM service includes personalized support from designated clinical staff supervised by his physician, including individualized plan of care and coordination with other care providers 2. 24/7 contact phone numbers for assistance for urgent and routine care needs. 3. Service will only be billed when office clinical staff spend 20 minutes or more in a month to coordinate care. 4. Only one practitioner may furnish and bill the service in a calendar month. 5. The patient may stop CCM services at any time (effective at the end of the month) by phone call to the office staff. 6. The patient will be responsible for cost sharing (co-pay) of up to 20% of the service fee (after annual deductible is met).  Patient agreed to services and verbal consent obtained.   Plan:   The care management team will reach out to the patient again over the next 60 to 90 days.   Noreene Larsson RN, MSN, [facility] Mobile: [phone]

## 2019-11-29 NOTE — Patient Instructions (Signed)
Visit Information  Goals Addressed            This Visit's Progress   . RNCM: Pt's daughter- "Dad is now living at [facility]" (pt-stated)       CARE PLAN ENTRY (see longtitudinal plan of care for additional care plan information)  Current Barriers:  . Chronic Disease Management support, education, and care coordination needs related to Atrial Fibrillation, CHF, HTN, HLD, DMII, and Parkinson's Disease  Clinical Goal(s) related toAtrial Fibrillation, CHF, HTN, HLD, DMII, and Parkinson's Disease:  Over the next 120 days, patient will:  . Work with the care management team to address educational, disease management, and care coordination needs  . Begin or continue self health monitoring activities as directed today Measure and record cbg (blood glucose) 1 times daily, Measure and record blood pressure 4 times per week, Measure and record weight daily, and adhere to a heart healthy/ADA diet per the facility policy. The patient is residing at [facility] . Call provider office for new or worsened signs and symptoms Blood glucose findings outside established parameters, Blood pressure findings outside established parameters, Weight outside established parameters, Oxygen saturation lower than established parameter, Shortness of breath, and New or worsened symptom related to Chronic health conditions . Call care management team with questions or concerns . Verbalize basic understanding of patient centered plan of care established today  Interventions related to Atrial Fibrillation, CHF, HTN, HLD, DMII, and Parkinson's Disease:  . Evaluation of current treatment plans and patient's adherence to plan as established by provider.  Spoke to the patients daughter Verdene Lennert concerning the patients transition from SNF to an independent living facility. Verdene Lennert states he has been there 3 weeks and is getting acclimated to his new home. The children  can go and see him whenever they want and her sister has been managing his medications. They have pill packaging set up and he is doing pretty well with this; however he does like to play cards with the other residents and sometimes forgets to take his medications.  . Assessed patient understanding of disease states.  The patient is being monitored at the facility. VS per facility protocol.  . Assessed patient's education and care coordination needs. Review of education needs. Per the daughter she has not heard from [facility]. The referral for palliative was made yesterday. This may be a good service for the patient to have since he is at an Independent care facility versus being at home. Call made to [facility], CM with [facility]. Discussion and collaboration on coordination of care.  Orrin Brigham has been working with the patient and patients family before current transition to [facility].  . Provided disease specific education to patient.  Advised patients daughter to call the [facility] for needed assistance or education.  Nash Dimmer with appropriate clinical care team members regarding patient needs.  Review of CCM service and the availability of CCM team pharmacist and LCSW to help with patient needs. Education on the role of the CCM team and working with [employer] to meet the patients health and wellness needs.   Patient Self Care Activities related to Atrial Fibrillation, CHF, HTN, HLD, DMII, and Parkinson's Disease . Patient is unable to independently self-manage chronic health conditions  Initial goal documentation        Mr. Dischler was given information about Care Management services today including:  1. Care Management services include personalized support from designated clinical staff supervised by his physician,  including individualized plan of care and coordination with other care providers 2. 24/7 contact phone numbers for assistance for urgent and routine care needs. 3. The  patient may stop CCM services at any time (effective at the end of the month) by phone call to the office staff.  Patient agreed to services and verbal consent obtained.   Patient verbalizes understanding of instructions provided today.   The care management team will reach out to the patient again over the next 60 to 90 days.   Noreene Larsson RN, MSN, [facility] Mobile: [phone]

## 2019-11-30 DIAGNOSIS — R1312 Dysphagia, oropharyngeal phase: Secondary | ICD-10-CM | POA: Diagnosis not present

## 2019-11-30 DIAGNOSIS — M6281 Muscle weakness (generalized): Secondary | ICD-10-CM | POA: Diagnosis not present

## 2019-11-30 DIAGNOSIS — G20 Parkinson's disease: Secondary | ICD-10-CM | POA: Diagnosis not present

## 2019-11-30 DIAGNOSIS — R0689 Other abnormalities of breathing: Secondary | ICD-10-CM | POA: Diagnosis not present

## 2019-11-30 DIAGNOSIS — R2681 Unsteadiness on feet: Secondary | ICD-10-CM | POA: Diagnosis not present

## 2019-11-30 DIAGNOSIS — R2689 Other abnormalities of gait and mobility: Secondary | ICD-10-CM | POA: Diagnosis not present

## 2019-12-01 ENCOUNTER — Telehealth: Payer: Self-pay

## 2019-12-01 ENCOUNTER — Other Ambulatory Visit: Payer: Self-pay | Admitting: *Deleted

## 2019-12-01 NOTE — Telephone Encounter (Signed)
Telephone call to patients daughter Verdene Lennert to schedule palliative care visit.  Daughter reports patient is currently at [facility] after discharging from [facility].  Daughter states patient is receiving PT, OT, Speech and HHA services through home health.  Daughter feels patient has enough people coming into see him at the present time.  Daughter does not feel like palliative care services are needed at the present time.  Daughter states she will request referral from [facility] should patient want/need services.  RN called and updated Marthenia Rolling with [facility].

## 2019-12-01 NOTE — Patient Outreach (Signed)
[facility] Post- Acute Care Coordinator collaboration.  Received telephone call from [facility] palliative. Member's daughter/DPR Verdene Lennert declined [facility] palliative follow up. Stated Mr. Calafiore is getting overwhelmed with all the services he has in place while he is in [facility].   Made [facility] ECM RN aware that palliative care services were declined at this time by daughter [place].  Mr. Essa remains in [place].    Marthenia Rolling, MSN-Ed, RN,BSN [place] Acute Care Coordinator [phone] [facility]) [phone]  (Toll free office)

## 2019-12-01 NOTE — Telephone Encounter (Signed)
RN placed call to patients daughter York Harcum and left message requesting cal back to schedule palliative care visit.

## 2019-12-02 DIAGNOSIS — R0689 Other abnormalities of breathing: Secondary | ICD-10-CM | POA: Diagnosis not present

## 2019-12-02 DIAGNOSIS — M6281 Muscle weakness (generalized): Secondary | ICD-10-CM | POA: Diagnosis not present

## 2019-12-02 DIAGNOSIS — R2681 Unsteadiness on feet: Secondary | ICD-10-CM | POA: Diagnosis not present

## 2019-12-02 DIAGNOSIS — R1312 Dysphagia, oropharyngeal phase: Secondary | ICD-10-CM | POA: Diagnosis not present

## 2019-12-02 DIAGNOSIS — R2689 Other abnormalities of gait and mobility: Secondary | ICD-10-CM | POA: Diagnosis not present

## 2019-12-02 DIAGNOSIS — G20 Parkinson's disease: Secondary | ICD-10-CM | POA: Diagnosis not present

## 2019-12-05 DIAGNOSIS — R1312 Dysphagia, oropharyngeal phase: Secondary | ICD-10-CM | POA: Diagnosis not present

## 2019-12-05 DIAGNOSIS — R2681 Unsteadiness on feet: Secondary | ICD-10-CM | POA: Diagnosis not present

## 2019-12-05 DIAGNOSIS — R0689 Other abnormalities of breathing: Secondary | ICD-10-CM | POA: Diagnosis not present

## 2019-12-05 DIAGNOSIS — M6281 Muscle weakness (generalized): Secondary | ICD-10-CM | POA: Diagnosis not present

## 2019-12-05 DIAGNOSIS — R2689 Other abnormalities of gait and mobility: Secondary | ICD-10-CM | POA: Diagnosis not present

## 2019-12-05 DIAGNOSIS — G20 Parkinson's disease: Secondary | ICD-10-CM | POA: Diagnosis not present

## 2019-12-23 ENCOUNTER — Ambulatory Visit: Payer: Medicare Other | Admitting: Nurse Practitioner

## 2019-12-29 ENCOUNTER — Telehealth: Payer: Self-pay | Admitting: Cardiovascular Disease

## 2019-12-29 NOTE — Telephone Encounter (Signed)
   [facility] Pre-operative Risk Assessment    HEARTCARE STAFF: - Please ensure there is not already an duplicate clearance open for this procedure. - Under Visit Info/Reason for Call, type in Other and utilize the format Clearance MM/DD/YY or Clearance TBD. Do not use dashes or single digits. - If request is for dental extraction, please clarify the # of teeth to be extracted.  Request for surgical clearance:  1. What type of surgery is being performed? 4 TEETH EXTRACTIONS(2 AT A TIME)  2. When is this surgery scheduled? TBD  What type of clearance is required (medical clearance vs. Pharmacy clearance to hold med vs. Both)? BOTH  3. Are there any medications that need to be held prior to surgery and how long? ELIQUIS  4. Practice name and name of physician performing surgery? [facility], DR Marjory Lies [place]   5. What is the office phone number? [phone]   7.   What is the office fax number? [phone]  8.   Anesthesia type (None, local, MAC, general) ? LOCAL   Elissa Hefty 12/29/2019, 9:23 AM  _________________________________________________________________   (provider comments below)

## 2019-12-29 NOTE — Telephone Encounter (Signed)
Patient with diagnosis of PAF on Eliquis for anticoagulation.    Procedure: dental extractions.  2 teeth, two separate encounters Date of procedure: TBD  CHADS2-VASc score of  8 (CHF, HTN, AGE x 2, DM2, stroke/tia x 2, CAD)  CrCl 50 mL/min  Patient at very high risk of stroke off of anticoagulation. Do not recommend patient hold anticoagulant.

## 2019-12-29 NOTE — Telephone Encounter (Signed)
Clinical pharmacist to review, patient has h/o PAF, since only 4 teeth are expected to be extracted (2 at a time), please confirm if she can do the procedure without holding the Eliquis

## 2019-12-30 NOTE — Telephone Encounter (Signed)
   Primary Cardiologist: Ida Rogue, MD  Chart reviewed as part of pre-operative protocol coverage.   Simple dental extractions are considered low risk procedures per guidelines and generally do not require any specific cardiac clearance. It is also generally accepted that for simple extractions and dental cleanings, there is no need to interrupt blood thinner therapy.  SBE prophylaxis is not required for the patient.  I will route this recommendation to the requesting party via Epic fax function and remove from pre-op pool.  Please call with questions.  [place], [place] 12/30/2019, 9:53 AM

## 2020-01-05 DIAGNOSIS — R2681 Unsteadiness on feet: Secondary | ICD-10-CM | POA: Diagnosis not present

## 2020-01-05 DIAGNOSIS — R2689 Other abnormalities of gait and mobility: Secondary | ICD-10-CM | POA: Diagnosis not present

## 2020-01-05 DIAGNOSIS — R1312 Dysphagia, oropharyngeal phase: Secondary | ICD-10-CM | POA: Diagnosis not present

## 2020-01-05 DIAGNOSIS — M6281 Muscle weakness (generalized): Secondary | ICD-10-CM | POA: Diagnosis not present

## 2020-01-05 DIAGNOSIS — G20 Parkinson's disease: Secondary | ICD-10-CM | POA: Diagnosis not present

## 2020-01-05 DIAGNOSIS — R0689 Other abnormalities of breathing: Secondary | ICD-10-CM | POA: Diagnosis not present

## 2020-01-06 DIAGNOSIS — R1312 Dysphagia, oropharyngeal phase: Secondary | ICD-10-CM | POA: Diagnosis not present

## 2020-01-06 DIAGNOSIS — E1159 Type 2 diabetes mellitus with other circulatory complications: Secondary | ICD-10-CM | POA: Diagnosis not present

## 2020-01-06 DIAGNOSIS — G20 Parkinson's disease: Secondary | ICD-10-CM | POA: Diagnosis not present

## 2020-01-06 DIAGNOSIS — M6281 Muscle weakness (generalized): Secondary | ICD-10-CM | POA: Diagnosis not present

## 2020-01-06 DIAGNOSIS — R2681 Unsteadiness on feet: Secondary | ICD-10-CM | POA: Diagnosis not present

## 2020-01-06 DIAGNOSIS — R0689 Other abnormalities of breathing: Secondary | ICD-10-CM | POA: Diagnosis not present

## 2020-01-06 DIAGNOSIS — R2689 Other abnormalities of gait and mobility: Secondary | ICD-10-CM | POA: Diagnosis not present

## 2020-01-06 DIAGNOSIS — Z79899 Other long term (current) drug therapy: Secondary | ICD-10-CM | POA: Diagnosis not present

## 2020-01-09 DIAGNOSIS — G20 Parkinson's disease: Secondary | ICD-10-CM | POA: Diagnosis not present

## 2020-01-09 DIAGNOSIS — R0689 Other abnormalities of breathing: Secondary | ICD-10-CM | POA: Diagnosis not present

## 2020-01-09 DIAGNOSIS — Z789 Other specified health status: Secondary | ICD-10-CM | POA: Diagnosis not present

## 2020-01-09 DIAGNOSIS — R1312 Dysphagia, oropharyngeal phase: Secondary | ICD-10-CM | POA: Diagnosis not present

## 2020-01-09 DIAGNOSIS — M6281 Muscle weakness (generalized): Secondary | ICD-10-CM | POA: Diagnosis not present

## 2020-01-09 DIAGNOSIS — R2689 Other abnormalities of gait and mobility: Secondary | ICD-10-CM | POA: Diagnosis not present

## 2020-01-09 DIAGNOSIS — R1310 Dysphagia, unspecified: Secondary | ICD-10-CM | POA: Diagnosis not present

## 2020-01-09 DIAGNOSIS — R2681 Unsteadiness on feet: Secondary | ICD-10-CM | POA: Diagnosis not present

## 2020-01-09 DIAGNOSIS — R05 Cough: Secondary | ICD-10-CM | POA: Diagnosis not present

## 2020-01-09 DIAGNOSIS — L03116 Cellulitis of left lower limb: Secondary | ICD-10-CM | POA: Diagnosis not present

## 2020-01-10 ENCOUNTER — Other Ambulatory Visit: Payer: Self-pay

## 2020-01-10 DIAGNOSIS — G20 Parkinson's disease: Secondary | ICD-10-CM | POA: Diagnosis not present

## 2020-01-10 DIAGNOSIS — R0689 Other abnormalities of breathing: Secondary | ICD-10-CM | POA: Diagnosis not present

## 2020-01-10 DIAGNOSIS — M6281 Muscle weakness (generalized): Secondary | ICD-10-CM | POA: Diagnosis not present

## 2020-01-10 DIAGNOSIS — E785 Hyperlipidemia, unspecified: Secondary | ICD-10-CM

## 2020-01-10 DIAGNOSIS — R2689 Other abnormalities of gait and mobility: Secondary | ICD-10-CM | POA: Diagnosis not present

## 2020-01-10 DIAGNOSIS — E1159 Type 2 diabetes mellitus with other circulatory complications: Secondary | ICD-10-CM

## 2020-01-10 DIAGNOSIS — R2681 Unsteadiness on feet: Secondary | ICD-10-CM | POA: Diagnosis not present

## 2020-01-10 DIAGNOSIS — R1312 Dysphagia, oropharyngeal phase: Secondary | ICD-10-CM | POA: Diagnosis not present

## 2020-01-10 MED ORDER — METOPROLOL SUCCINATE ER 50 MG PO TB24: 50.0000 mg | ORAL_TABLET | Freq: Every day | ORAL | 2 refills | Status: DC

## 2020-01-10 MED ORDER — DAPAGLIFLOZIN PROPANEDIOL 10 MG PO TABS: 10.0000 mg | ORAL_TABLET | Freq: Every day | ORAL | 0 refills | Status: DC

## 2020-01-10 MED ORDER — ROSUVASTATIN CALCIUM 10 MG PO TABS: 10.0000 mg | ORAL_TABLET | Freq: Every day | ORAL | 2 refills | Status: DC

## 2020-01-10 MED ORDER — GLIPIZIDE-METFORMIN HCL 5-500 MG PO TABS: 1.0000 | ORAL_TABLET | Freq: Two times a day (BID) | ORAL | 0 refills | Status: DC

## 2020-01-10 NOTE — Telephone Encounter (Signed)
Lvm with information below 

## 2020-01-11 DIAGNOSIS — R1312 Dysphagia, oropharyngeal phase: Secondary | ICD-10-CM | POA: Diagnosis not present

## 2020-01-11 DIAGNOSIS — R2689 Other abnormalities of gait and mobility: Secondary | ICD-10-CM | POA: Diagnosis not present

## 2020-01-11 DIAGNOSIS — M6281 Muscle weakness (generalized): Secondary | ICD-10-CM | POA: Diagnosis not present

## 2020-01-11 DIAGNOSIS — R0689 Other abnormalities of breathing: Secondary | ICD-10-CM | POA: Diagnosis not present

## 2020-01-11 DIAGNOSIS — R2681 Unsteadiness on feet: Secondary | ICD-10-CM | POA: Diagnosis not present

## 2020-01-11 DIAGNOSIS — G20 Parkinson's disease: Secondary | ICD-10-CM | POA: Diagnosis not present

## 2020-01-12 DIAGNOSIS — R0689 Other abnormalities of breathing: Secondary | ICD-10-CM | POA: Diagnosis not present

## 2020-01-12 DIAGNOSIS — G20 Parkinson's disease: Secondary | ICD-10-CM | POA: Diagnosis not present

## 2020-01-12 DIAGNOSIS — R2681 Unsteadiness on feet: Secondary | ICD-10-CM | POA: Diagnosis not present

## 2020-01-12 DIAGNOSIS — M6281 Muscle weakness (generalized): Secondary | ICD-10-CM | POA: Diagnosis not present

## 2020-01-12 DIAGNOSIS — R1312 Dysphagia, oropharyngeal phase: Secondary | ICD-10-CM | POA: Diagnosis not present

## 2020-01-12 DIAGNOSIS — R2689 Other abnormalities of gait and mobility: Secondary | ICD-10-CM | POA: Diagnosis not present

## 2020-01-13 DIAGNOSIS — R0689 Other abnormalities of breathing: Secondary | ICD-10-CM | POA: Diagnosis not present

## 2020-01-13 DIAGNOSIS — M6281 Muscle weakness (generalized): Secondary | ICD-10-CM | POA: Diagnosis not present

## 2020-01-13 DIAGNOSIS — G20 Parkinson's disease: Secondary | ICD-10-CM | POA: Diagnosis not present

## 2020-01-13 DIAGNOSIS — R1312 Dysphagia, oropharyngeal phase: Secondary | ICD-10-CM | POA: Diagnosis not present

## 2020-01-13 DIAGNOSIS — R2681 Unsteadiness on feet: Secondary | ICD-10-CM | POA: Diagnosis not present

## 2020-01-13 DIAGNOSIS — R2689 Other abnormalities of gait and mobility: Secondary | ICD-10-CM | POA: Diagnosis not present

## 2020-01-16 DIAGNOSIS — R2689 Other abnormalities of gait and mobility: Secondary | ICD-10-CM | POA: Diagnosis not present

## 2020-01-16 DIAGNOSIS — M6281 Muscle weakness (generalized): Secondary | ICD-10-CM | POA: Diagnosis not present

## 2020-01-16 DIAGNOSIS — G20 Parkinson's disease: Secondary | ICD-10-CM | POA: Diagnosis not present

## 2020-01-16 DIAGNOSIS — R2681 Unsteadiness on feet: Secondary | ICD-10-CM | POA: Diagnosis not present

## 2020-01-16 DIAGNOSIS — R0689 Other abnormalities of breathing: Secondary | ICD-10-CM | POA: Diagnosis not present

## 2020-01-16 DIAGNOSIS — R1312 Dysphagia, oropharyngeal phase: Secondary | ICD-10-CM | POA: Diagnosis not present

## 2020-01-17 ENCOUNTER — Other Ambulatory Visit: Payer: Self-pay | Admitting: Nurse Practitioner

## 2020-01-17 DIAGNOSIS — R2681 Unsteadiness on feet: Secondary | ICD-10-CM | POA: Diagnosis not present

## 2020-01-17 DIAGNOSIS — G20 Parkinson's disease: Secondary | ICD-10-CM | POA: Diagnosis not present

## 2020-01-17 DIAGNOSIS — R2689 Other abnormalities of gait and mobility: Secondary | ICD-10-CM | POA: Diagnosis not present

## 2020-01-17 DIAGNOSIS — E1159 Type 2 diabetes mellitus with other circulatory complications: Secondary | ICD-10-CM

## 2020-01-17 DIAGNOSIS — M6281 Muscle weakness (generalized): Secondary | ICD-10-CM | POA: Diagnosis not present

## 2020-01-17 DIAGNOSIS — R0689 Other abnormalities of breathing: Secondary | ICD-10-CM | POA: Diagnosis not present

## 2020-01-17 DIAGNOSIS — R1312 Dysphagia, oropharyngeal phase: Secondary | ICD-10-CM | POA: Diagnosis not present

## 2020-01-18 DIAGNOSIS — R2681 Unsteadiness on feet: Secondary | ICD-10-CM | POA: Diagnosis not present

## 2020-01-18 DIAGNOSIS — M6281 Muscle weakness (generalized): Secondary | ICD-10-CM | POA: Diagnosis not present

## 2020-01-18 DIAGNOSIS — R2689 Other abnormalities of gait and mobility: Secondary | ICD-10-CM | POA: Diagnosis not present

## 2020-01-18 DIAGNOSIS — R0689 Other abnormalities of breathing: Secondary | ICD-10-CM | POA: Diagnosis not present

## 2020-01-18 DIAGNOSIS — R1312 Dysphagia, oropharyngeal phase: Secondary | ICD-10-CM | POA: Diagnosis not present

## 2020-01-18 DIAGNOSIS — G20 Parkinson's disease: Secondary | ICD-10-CM | POA: Diagnosis not present

## 2020-01-18 NOTE — Telephone Encounter (Signed)
Spoke with the pts daughter and she stated that she would give [place] a call back to get him scheduled for an appt.

## 2020-01-19 DIAGNOSIS — M6281 Muscle weakness (generalized): Secondary | ICD-10-CM | POA: Diagnosis not present

## 2020-01-19 DIAGNOSIS — R1312 Dysphagia, oropharyngeal phase: Secondary | ICD-10-CM | POA: Diagnosis not present

## 2020-01-19 DIAGNOSIS — R2681 Unsteadiness on feet: Secondary | ICD-10-CM | POA: Diagnosis not present

## 2020-01-19 DIAGNOSIS — R0689 Other abnormalities of breathing: Secondary | ICD-10-CM | POA: Diagnosis not present

## 2020-01-19 DIAGNOSIS — R2689 Other abnormalities of gait and mobility: Secondary | ICD-10-CM | POA: Diagnosis not present

## 2020-01-19 DIAGNOSIS — G20 Parkinson's disease: Secondary | ICD-10-CM | POA: Diagnosis not present

## 2020-01-20 DIAGNOSIS — R2681 Unsteadiness on feet: Secondary | ICD-10-CM | POA: Diagnosis not present

## 2020-01-20 DIAGNOSIS — R0689 Other abnormalities of breathing: Secondary | ICD-10-CM | POA: Diagnosis not present

## 2020-01-20 DIAGNOSIS — M6281 Muscle weakness (generalized): Secondary | ICD-10-CM | POA: Diagnosis not present

## 2020-01-20 DIAGNOSIS — R2689 Other abnormalities of gait and mobility: Secondary | ICD-10-CM | POA: Diagnosis not present

## 2020-01-20 DIAGNOSIS — G20 Parkinson's disease: Secondary | ICD-10-CM | POA: Diagnosis not present

## 2020-01-20 DIAGNOSIS — R1312 Dysphagia, oropharyngeal phase: Secondary | ICD-10-CM | POA: Diagnosis not present

## 2020-01-21 DIAGNOSIS — M6281 Muscle weakness (generalized): Secondary | ICD-10-CM | POA: Diagnosis not present

## 2020-01-21 DIAGNOSIS — R2689 Other abnormalities of gait and mobility: Secondary | ICD-10-CM | POA: Diagnosis not present

## 2020-01-21 DIAGNOSIS — R1312 Dysphagia, oropharyngeal phase: Secondary | ICD-10-CM | POA: Diagnosis not present

## 2020-01-21 DIAGNOSIS — R2681 Unsteadiness on feet: Secondary | ICD-10-CM | POA: Diagnosis not present

## 2020-01-21 DIAGNOSIS — R0689 Other abnormalities of breathing: Secondary | ICD-10-CM | POA: Diagnosis not present

## 2020-01-21 DIAGNOSIS — G20 Parkinson's disease: Secondary | ICD-10-CM | POA: Diagnosis not present

## 2020-01-22 DIAGNOSIS — M6281 Muscle weakness (generalized): Secondary | ICD-10-CM | POA: Diagnosis not present

## 2020-01-22 DIAGNOSIS — R1312 Dysphagia, oropharyngeal phase: Secondary | ICD-10-CM | POA: Diagnosis not present

## 2020-01-22 DIAGNOSIS — R2689 Other abnormalities of gait and mobility: Secondary | ICD-10-CM | POA: Diagnosis not present

## 2020-01-22 DIAGNOSIS — G20 Parkinson's disease: Secondary | ICD-10-CM | POA: Diagnosis not present

## 2020-01-22 DIAGNOSIS — R0689 Other abnormalities of breathing: Secondary | ICD-10-CM | POA: Diagnosis not present

## 2020-01-22 DIAGNOSIS — R2681 Unsteadiness on feet: Secondary | ICD-10-CM | POA: Diagnosis not present

## 2020-01-23 DIAGNOSIS — R2689 Other abnormalities of gait and mobility: Secondary | ICD-10-CM | POA: Diagnosis not present

## 2020-01-23 DIAGNOSIS — R0689 Other abnormalities of breathing: Secondary | ICD-10-CM | POA: Diagnosis not present

## 2020-01-23 DIAGNOSIS — M6281 Muscle weakness (generalized): Secondary | ICD-10-CM | POA: Diagnosis not present

## 2020-01-23 DIAGNOSIS — R2681 Unsteadiness on feet: Secondary | ICD-10-CM | POA: Diagnosis not present

## 2020-01-23 DIAGNOSIS — G20 Parkinson's disease: Secondary | ICD-10-CM | POA: Diagnosis not present

## 2020-01-23 DIAGNOSIS — R1312 Dysphagia, oropharyngeal phase: Secondary | ICD-10-CM | POA: Diagnosis not present

## 2020-01-24 DIAGNOSIS — R2689 Other abnormalities of gait and mobility: Secondary | ICD-10-CM | POA: Diagnosis not present

## 2020-01-24 DIAGNOSIS — R2681 Unsteadiness on feet: Secondary | ICD-10-CM | POA: Diagnosis not present

## 2020-01-24 DIAGNOSIS — R0689 Other abnormalities of breathing: Secondary | ICD-10-CM | POA: Diagnosis not present

## 2020-01-24 DIAGNOSIS — G20 Parkinson's disease: Secondary | ICD-10-CM | POA: Diagnosis not present

## 2020-01-24 DIAGNOSIS — R1312 Dysphagia, oropharyngeal phase: Secondary | ICD-10-CM | POA: Diagnosis not present

## 2020-01-24 DIAGNOSIS — M6281 Muscle weakness (generalized): Secondary | ICD-10-CM | POA: Diagnosis not present

## 2020-01-25 DIAGNOSIS — R2681 Unsteadiness on feet: Secondary | ICD-10-CM | POA: Diagnosis not present

## 2020-01-25 DIAGNOSIS — R1312 Dysphagia, oropharyngeal phase: Secondary | ICD-10-CM | POA: Diagnosis not present

## 2020-01-25 DIAGNOSIS — R0689 Other abnormalities of breathing: Secondary | ICD-10-CM | POA: Diagnosis not present

## 2020-01-25 DIAGNOSIS — R2689 Other abnormalities of gait and mobility: Secondary | ICD-10-CM | POA: Diagnosis not present

## 2020-01-25 DIAGNOSIS — M6281 Muscle weakness (generalized): Secondary | ICD-10-CM | POA: Diagnosis not present

## 2020-01-25 DIAGNOSIS — G20 Parkinson's disease: Secondary | ICD-10-CM | POA: Diagnosis not present

## 2020-01-26 DIAGNOSIS — R2689 Other abnormalities of gait and mobility: Secondary | ICD-10-CM | POA: Diagnosis not present

## 2020-01-26 DIAGNOSIS — G20 Parkinson's disease: Secondary | ICD-10-CM | POA: Diagnosis not present

## 2020-01-26 DIAGNOSIS — R0689 Other abnormalities of breathing: Secondary | ICD-10-CM | POA: Diagnosis not present

## 2020-01-26 DIAGNOSIS — R2681 Unsteadiness on feet: Secondary | ICD-10-CM | POA: Diagnosis not present

## 2020-01-26 DIAGNOSIS — R1312 Dysphagia, oropharyngeal phase: Secondary | ICD-10-CM | POA: Diagnosis not present

## 2020-01-26 DIAGNOSIS — M6281 Muscle weakness (generalized): Secondary | ICD-10-CM | POA: Diagnosis not present

## 2020-01-29 DIAGNOSIS — G20 Parkinson's disease: Secondary | ICD-10-CM | POA: Diagnosis not present

## 2020-01-29 DIAGNOSIS — R2681 Unsteadiness on feet: Secondary | ICD-10-CM | POA: Diagnosis not present

## 2020-01-29 DIAGNOSIS — R0689 Other abnormalities of breathing: Secondary | ICD-10-CM | POA: Diagnosis not present

## 2020-01-29 DIAGNOSIS — R2689 Other abnormalities of gait and mobility: Secondary | ICD-10-CM | POA: Diagnosis not present

## 2020-01-29 DIAGNOSIS — M6281 Muscle weakness (generalized): Secondary | ICD-10-CM | POA: Diagnosis not present

## 2020-01-29 DIAGNOSIS — R1312 Dysphagia, oropharyngeal phase: Secondary | ICD-10-CM | POA: Diagnosis not present

## 2020-01-30 DIAGNOSIS — I10 Essential (primary) hypertension: Secondary | ICD-10-CM | POA: Diagnosis not present

## 2020-01-30 DIAGNOSIS — R1310 Dysphagia, unspecified: Secondary | ICD-10-CM | POA: Diagnosis not present

## 2020-01-30 DIAGNOSIS — R1312 Dysphagia, oropharyngeal phase: Secondary | ICD-10-CM | POA: Diagnosis not present

## 2020-01-30 DIAGNOSIS — D510 Vitamin B12 deficiency anemia due to intrinsic factor deficiency: Secondary | ICD-10-CM | POA: Diagnosis not present

## 2020-01-30 DIAGNOSIS — R2689 Other abnormalities of gait and mobility: Secondary | ICD-10-CM | POA: Diagnosis not present

## 2020-01-30 DIAGNOSIS — G20 Parkinson's disease: Secondary | ICD-10-CM | POA: Diagnosis not present

## 2020-01-30 DIAGNOSIS — L259 Unspecified contact dermatitis, unspecified cause: Secondary | ICD-10-CM | POA: Diagnosis not present

## 2020-01-30 DIAGNOSIS — R2681 Unsteadiness on feet: Secondary | ICD-10-CM | POA: Diagnosis not present

## 2020-01-30 DIAGNOSIS — R0689 Other abnormalities of breathing: Secondary | ICD-10-CM | POA: Diagnosis not present

## 2020-01-30 DIAGNOSIS — M6281 Muscle weakness (generalized): Secondary | ICD-10-CM | POA: Diagnosis not present

## 2020-01-30 DIAGNOSIS — R05 Cough: Secondary | ICD-10-CM | POA: Diagnosis not present

## 2020-01-30 DIAGNOSIS — K219 Gastro-esophageal reflux disease without esophagitis: Secondary | ICD-10-CM | POA: Diagnosis not present

## 2020-01-31 ENCOUNTER — Telehealth: Payer: Self-pay | Admitting: General Practice

## 2020-01-31 ENCOUNTER — Telehealth: Payer: Self-pay

## 2020-01-31 NOTE — Telephone Encounter (Signed)
  Chronic Care Management   Outreach Note  01/31/2020 Name: Timothy [name] MRN: [id number] DOB: [birth year]  Referred by: Venita Lick, NP Reason for referral : Appointment (RNCM Follow up appointment )   An unsuccessful telephone outreach was attempted today. The patient was referred to the case management team for assistance with care management and care coordination.   Follow Up Plan: The care management team will reach out to the patient again over the next 30 to 45 days.   Noreene Larsson RN, MSN, [facility] Mobile: [phone]

## 2020-02-01 ENCOUNTER — Telehealth: Payer: Self-pay

## 2020-02-01 DIAGNOSIS — R0689 Other abnormalities of breathing: Secondary | ICD-10-CM | POA: Diagnosis not present

## 2020-02-01 DIAGNOSIS — R2689 Other abnormalities of gait and mobility: Secondary | ICD-10-CM | POA: Diagnosis not present

## 2020-02-01 DIAGNOSIS — R1312 Dysphagia, oropharyngeal phase: Secondary | ICD-10-CM | POA: Diagnosis not present

## 2020-02-01 DIAGNOSIS — M6281 Muscle weakness (generalized): Secondary | ICD-10-CM | POA: Diagnosis not present

## 2020-02-01 DIAGNOSIS — R2681 Unsteadiness on feet: Secondary | ICD-10-CM | POA: Diagnosis not present

## 2020-02-01 DIAGNOSIS — G20 Parkinson's disease: Secondary | ICD-10-CM | POA: Diagnosis not present

## 2020-02-01 NOTE — Chronic Care Management (AMB) (Signed)
  Care Management   Note  02/01/2020 Name: Timothy [name] MRN: [id number] DOB: Oct 11, 1928  Timothy [name] is a 86 y.o. year old male who is a primary care patient of Venita Lick, NP and is actively engaged with the care management team. I reached out to Duane Lope by phone today to assist with re-scheduling a follow up visit with the RN Case Manager  Follow up plan: Patient's Daughter Timothy [name] declines further follow up and engagement by the care management team at this time. Daughter states that she feels pt is ok, will reach out when needed. Appropriate care team members and provider have been notified via electronic communication.   Noreene Larsson, [place], [place], [place] [address] Direct Dial: [phone] [email or web address]: [email or web address]

## 2020-02-01 NOTE — Telephone Encounter (Signed)
Spoke with pt's daughter and she stated that pt is ok at this time and will reach out if anything arises.

## 2020-02-02 DIAGNOSIS — R2689 Other abnormalities of gait and mobility: Secondary | ICD-10-CM | POA: Diagnosis not present

## 2020-02-02 DIAGNOSIS — R1312 Dysphagia, oropharyngeal phase: Secondary | ICD-10-CM | POA: Diagnosis not present

## 2020-02-02 DIAGNOSIS — G20 Parkinson's disease: Secondary | ICD-10-CM | POA: Diagnosis not present

## 2020-02-02 DIAGNOSIS — M6281 Muscle weakness (generalized): Secondary | ICD-10-CM | POA: Diagnosis not present

## 2020-02-02 DIAGNOSIS — R0689 Other abnormalities of breathing: Secondary | ICD-10-CM | POA: Diagnosis not present

## 2020-02-02 DIAGNOSIS — E1159 Type 2 diabetes mellitus with other circulatory complications: Secondary | ICD-10-CM | POA: Diagnosis not present

## 2020-02-02 DIAGNOSIS — R2681 Unsteadiness on feet: Secondary | ICD-10-CM | POA: Diagnosis not present

## 2020-02-03 DIAGNOSIS — M6281 Muscle weakness (generalized): Secondary | ICD-10-CM | POA: Diagnosis not present

## 2020-02-03 DIAGNOSIS — R2681 Unsteadiness on feet: Secondary | ICD-10-CM | POA: Diagnosis not present

## 2020-02-03 DIAGNOSIS — R1312 Dysphagia, oropharyngeal phase: Secondary | ICD-10-CM | POA: Diagnosis not present

## 2020-02-03 DIAGNOSIS — R0689 Other abnormalities of breathing: Secondary | ICD-10-CM | POA: Diagnosis not present

## 2020-02-03 DIAGNOSIS — G20 Parkinson's disease: Secondary | ICD-10-CM | POA: Diagnosis not present

## 2020-02-03 DIAGNOSIS — R2689 Other abnormalities of gait and mobility: Secondary | ICD-10-CM | POA: Diagnosis not present

## 2020-02-04 DIAGNOSIS — R2689 Other abnormalities of gait and mobility: Secondary | ICD-10-CM | POA: Diagnosis not present

## 2020-02-04 DIAGNOSIS — G20 Parkinson's disease: Secondary | ICD-10-CM | POA: Diagnosis not present

## 2020-02-04 DIAGNOSIS — M6281 Muscle weakness (generalized): Secondary | ICD-10-CM | POA: Diagnosis not present

## 2020-02-04 DIAGNOSIS — R0689 Other abnormalities of breathing: Secondary | ICD-10-CM | POA: Diagnosis not present

## 2020-02-04 DIAGNOSIS — R2681 Unsteadiness on feet: Secondary | ICD-10-CM | POA: Diagnosis not present

## 2020-02-04 DIAGNOSIS — R1312 Dysphagia, oropharyngeal phase: Secondary | ICD-10-CM | POA: Diagnosis not present

## 2020-02-06 DIAGNOSIS — G20 Parkinson's disease: Secondary | ICD-10-CM | POA: Diagnosis not present

## 2020-02-06 DIAGNOSIS — M6281 Muscle weakness (generalized): Secondary | ICD-10-CM | POA: Diagnosis not present

## 2020-02-06 DIAGNOSIS — R2689 Other abnormalities of gait and mobility: Secondary | ICD-10-CM | POA: Diagnosis not present

## 2020-02-06 DIAGNOSIS — R2681 Unsteadiness on feet: Secondary | ICD-10-CM | POA: Diagnosis not present

## 2020-02-06 DIAGNOSIS — R1312 Dysphagia, oropharyngeal phase: Secondary | ICD-10-CM | POA: Diagnosis not present

## 2020-02-06 DIAGNOSIS — R0689 Other abnormalities of breathing: Secondary | ICD-10-CM | POA: Diagnosis not present

## 2020-02-07 DIAGNOSIS — R2689 Other abnormalities of gait and mobility: Secondary | ICD-10-CM | POA: Diagnosis not present

## 2020-02-07 DIAGNOSIS — G20 Parkinson's disease: Secondary | ICD-10-CM | POA: Diagnosis not present

## 2020-02-07 DIAGNOSIS — R0689 Other abnormalities of breathing: Secondary | ICD-10-CM | POA: Diagnosis not present

## 2020-02-07 DIAGNOSIS — R2681 Unsteadiness on feet: Secondary | ICD-10-CM | POA: Diagnosis not present

## 2020-02-07 DIAGNOSIS — R1312 Dysphagia, oropharyngeal phase: Secondary | ICD-10-CM | POA: Diagnosis not present

## 2020-02-07 DIAGNOSIS — M6281 Muscle weakness (generalized): Secondary | ICD-10-CM | POA: Diagnosis not present

## 2020-02-08 DIAGNOSIS — G20 Parkinson's disease: Secondary | ICD-10-CM | POA: Diagnosis not present

## 2020-02-08 DIAGNOSIS — R2681 Unsteadiness on feet: Secondary | ICD-10-CM | POA: Diagnosis not present

## 2020-02-08 DIAGNOSIS — R1312 Dysphagia, oropharyngeal phase: Secondary | ICD-10-CM | POA: Diagnosis not present

## 2020-02-08 DIAGNOSIS — R0689 Other abnormalities of breathing: Secondary | ICD-10-CM | POA: Diagnosis not present

## 2020-02-08 DIAGNOSIS — M6281 Muscle weakness (generalized): Secondary | ICD-10-CM | POA: Diagnosis not present

## 2020-02-08 DIAGNOSIS — R2689 Other abnormalities of gait and mobility: Secondary | ICD-10-CM | POA: Diagnosis not present

## 2020-02-09 DIAGNOSIS — G20 Parkinson's disease: Secondary | ICD-10-CM | POA: Diagnosis not present

## 2020-02-09 DIAGNOSIS — R2681 Unsteadiness on feet: Secondary | ICD-10-CM | POA: Diagnosis not present

## 2020-02-09 DIAGNOSIS — M6281 Muscle weakness (generalized): Secondary | ICD-10-CM | POA: Diagnosis not present

## 2020-02-09 DIAGNOSIS — R2689 Other abnormalities of gait and mobility: Secondary | ICD-10-CM | POA: Diagnosis not present

## 2020-02-09 DIAGNOSIS — R1312 Dysphagia, oropharyngeal phase: Secondary | ICD-10-CM | POA: Diagnosis not present

## 2020-02-09 DIAGNOSIS — R0689 Other abnormalities of breathing: Secondary | ICD-10-CM | POA: Diagnosis not present

## 2020-02-10 DIAGNOSIS — R2689 Other abnormalities of gait and mobility: Secondary | ICD-10-CM | POA: Diagnosis not present

## 2020-02-10 DIAGNOSIS — R1312 Dysphagia, oropharyngeal phase: Secondary | ICD-10-CM | POA: Diagnosis not present

## 2020-02-10 DIAGNOSIS — R2681 Unsteadiness on feet: Secondary | ICD-10-CM | POA: Diagnosis not present

## 2020-02-10 DIAGNOSIS — G20 Parkinson's disease: Secondary | ICD-10-CM | POA: Diagnosis not present

## 2020-02-10 DIAGNOSIS — R0689 Other abnormalities of breathing: Secondary | ICD-10-CM | POA: Diagnosis not present

## 2020-02-10 DIAGNOSIS — M6281 Muscle weakness (generalized): Secondary | ICD-10-CM | POA: Diagnosis not present

## 2020-02-12 DIAGNOSIS — M6281 Muscle weakness (generalized): Secondary | ICD-10-CM | POA: Diagnosis not present

## 2020-02-12 DIAGNOSIS — R2689 Other abnormalities of gait and mobility: Secondary | ICD-10-CM | POA: Diagnosis not present

## 2020-02-12 DIAGNOSIS — R1312 Dysphagia, oropharyngeal phase: Secondary | ICD-10-CM | POA: Diagnosis not present

## 2020-02-12 DIAGNOSIS — R2681 Unsteadiness on feet: Secondary | ICD-10-CM | POA: Diagnosis not present

## 2020-02-12 DIAGNOSIS — R0689 Other abnormalities of breathing: Secondary | ICD-10-CM | POA: Diagnosis not present

## 2020-02-12 DIAGNOSIS — G20 Parkinson's disease: Secondary | ICD-10-CM | POA: Diagnosis not present

## 2020-02-13 DIAGNOSIS — R2689 Other abnormalities of gait and mobility: Secondary | ICD-10-CM | POA: Diagnosis not present

## 2020-02-13 DIAGNOSIS — M6281 Muscle weakness (generalized): Secondary | ICD-10-CM | POA: Diagnosis not present

## 2020-02-13 DIAGNOSIS — G20 Parkinson's disease: Secondary | ICD-10-CM | POA: Diagnosis not present

## 2020-02-13 DIAGNOSIS — R2681 Unsteadiness on feet: Secondary | ICD-10-CM | POA: Diagnosis not present

## 2020-02-13 DIAGNOSIS — R0689 Other abnormalities of breathing: Secondary | ICD-10-CM | POA: Diagnosis not present

## 2020-02-13 DIAGNOSIS — R1312 Dysphagia, oropharyngeal phase: Secondary | ICD-10-CM | POA: Diagnosis not present

## 2020-02-14 ENCOUNTER — Telehealth: Payer: Self-pay | Admitting: Cardiovascular Disease

## 2020-02-14 NOTE — Telephone Encounter (Signed)
Spoke with pt's daughter and she is aware that pt's meds for cardiac aren't due for new Rx until 9/21. Pt's daughter doesn't know which medications need new Rx. She will contact pharmacy to verify medications needed.  If any medications need to be refilled she will let us know the medications.

## 2020-02-14 NOTE — Telephone Encounter (Signed)
*  STAT* If patient is at the pharmacy, call can be transferred to refill team.   1. Which medications need to be refilled? (please list name of each medication and dose if known)   Cardiac meds - daughter calling and is not sure which are pcp and which are cards   2. Which pharmacy/location (including street and city if local pharmacy) is medication to be sent to?  [facility] [place]    3. Do they need a 30 day or 90 day supply?  [place]

## 2020-02-15 DIAGNOSIS — R0689 Other abnormalities of breathing: Secondary | ICD-10-CM | POA: Diagnosis not present

## 2020-02-15 DIAGNOSIS — R2681 Unsteadiness on feet: Secondary | ICD-10-CM | POA: Diagnosis not present

## 2020-02-15 DIAGNOSIS — M6281 Muscle weakness (generalized): Secondary | ICD-10-CM | POA: Diagnosis not present

## 2020-02-15 DIAGNOSIS — R1312 Dysphagia, oropharyngeal phase: Secondary | ICD-10-CM | POA: Diagnosis not present

## 2020-02-15 DIAGNOSIS — G20 Parkinson's disease: Secondary | ICD-10-CM | POA: Diagnosis not present

## 2020-02-15 DIAGNOSIS — R2689 Other abnormalities of gait and mobility: Secondary | ICD-10-CM | POA: Diagnosis not present

## 2020-02-17 DIAGNOSIS — R2681 Unsteadiness on feet: Secondary | ICD-10-CM | POA: Diagnosis not present

## 2020-02-17 DIAGNOSIS — R1312 Dysphagia, oropharyngeal phase: Secondary | ICD-10-CM | POA: Diagnosis not present

## 2020-02-17 DIAGNOSIS — G20 Parkinson's disease: Secondary | ICD-10-CM | POA: Diagnosis not present

## 2020-02-17 DIAGNOSIS — R0689 Other abnormalities of breathing: Secondary | ICD-10-CM | POA: Diagnosis not present

## 2020-02-17 DIAGNOSIS — R2689 Other abnormalities of gait and mobility: Secondary | ICD-10-CM | POA: Diagnosis not present

## 2020-02-17 DIAGNOSIS — M6281 Muscle weakness (generalized): Secondary | ICD-10-CM | POA: Diagnosis not present

## 2020-02-20 DIAGNOSIS — G20 Parkinson's disease: Secondary | ICD-10-CM | POA: Diagnosis not present

## 2020-02-20 DIAGNOSIS — R0689 Other abnormalities of breathing: Secondary | ICD-10-CM | POA: Diagnosis not present

## 2020-02-20 DIAGNOSIS — R2681 Unsteadiness on feet: Secondary | ICD-10-CM | POA: Diagnosis not present

## 2020-02-20 DIAGNOSIS — R2689 Other abnormalities of gait and mobility: Secondary | ICD-10-CM | POA: Diagnosis not present

## 2020-02-20 DIAGNOSIS — R1312 Dysphagia, oropharyngeal phase: Secondary | ICD-10-CM | POA: Diagnosis not present

## 2020-02-20 DIAGNOSIS — M6281 Muscle weakness (generalized): Secondary | ICD-10-CM | POA: Diagnosis not present

## 2020-02-21 DIAGNOSIS — M6281 Muscle weakness (generalized): Secondary | ICD-10-CM | POA: Diagnosis not present

## 2020-02-21 DIAGNOSIS — R2681 Unsteadiness on feet: Secondary | ICD-10-CM | POA: Diagnosis not present

## 2020-02-21 DIAGNOSIS — G20 Parkinson's disease: Secondary | ICD-10-CM | POA: Diagnosis not present

## 2020-02-21 DIAGNOSIS — R1312 Dysphagia, oropharyngeal phase: Secondary | ICD-10-CM | POA: Diagnosis not present

## 2020-02-21 DIAGNOSIS — R0689 Other abnormalities of breathing: Secondary | ICD-10-CM | POA: Diagnosis not present

## 2020-02-21 DIAGNOSIS — R2689 Other abnormalities of gait and mobility: Secondary | ICD-10-CM | POA: Diagnosis not present

## 2020-02-22 DIAGNOSIS — M6281 Muscle weakness (generalized): Secondary | ICD-10-CM | POA: Diagnosis not present

## 2020-02-22 DIAGNOSIS — R1312 Dysphagia, oropharyngeal phase: Secondary | ICD-10-CM | POA: Diagnosis not present

## 2020-02-22 DIAGNOSIS — G20 Parkinson's disease: Secondary | ICD-10-CM | POA: Diagnosis not present

## 2020-02-22 DIAGNOSIS — R2681 Unsteadiness on feet: Secondary | ICD-10-CM | POA: Diagnosis not present

## 2020-02-22 DIAGNOSIS — R0689 Other abnormalities of breathing: Secondary | ICD-10-CM | POA: Diagnosis not present

## 2020-02-22 DIAGNOSIS — R2689 Other abnormalities of gait and mobility: Secondary | ICD-10-CM | POA: Diagnosis not present

## 2020-02-23 DIAGNOSIS — B351 Tinea unguium: Secondary | ICD-10-CM | POA: Diagnosis not present

## 2020-02-23 DIAGNOSIS — R2689 Other abnormalities of gait and mobility: Secondary | ICD-10-CM | POA: Diagnosis not present

## 2020-02-23 DIAGNOSIS — R2681 Unsteadiness on feet: Secondary | ICD-10-CM | POA: Diagnosis not present

## 2020-02-23 DIAGNOSIS — G20 Parkinson's disease: Secondary | ICD-10-CM | POA: Diagnosis not present

## 2020-02-23 DIAGNOSIS — E1151 Type 2 diabetes mellitus with diabetic peripheral angiopathy without gangrene: Secondary | ICD-10-CM | POA: Diagnosis not present

## 2020-02-23 DIAGNOSIS — M6281 Muscle weakness (generalized): Secondary | ICD-10-CM | POA: Diagnosis not present

## 2020-02-23 DIAGNOSIS — R1312 Dysphagia, oropharyngeal phase: Secondary | ICD-10-CM | POA: Diagnosis not present

## 2020-02-23 DIAGNOSIS — R0689 Other abnormalities of breathing: Secondary | ICD-10-CM | POA: Diagnosis not present

## 2020-02-23 DIAGNOSIS — M79672 Pain in left foot: Secondary | ICD-10-CM | POA: Diagnosis not present

## 2020-02-23 DIAGNOSIS — M79671 Pain in right foot: Secondary | ICD-10-CM | POA: Diagnosis not present

## 2020-02-24 DIAGNOSIS — R2689 Other abnormalities of gait and mobility: Secondary | ICD-10-CM | POA: Diagnosis not present

## 2020-02-24 DIAGNOSIS — G20 Parkinson's disease: Secondary | ICD-10-CM | POA: Diagnosis not present

## 2020-02-24 DIAGNOSIS — R1312 Dysphagia, oropharyngeal phase: Secondary | ICD-10-CM | POA: Diagnosis not present

## 2020-02-24 DIAGNOSIS — R0689 Other abnormalities of breathing: Secondary | ICD-10-CM | POA: Diagnosis not present

## 2020-02-24 DIAGNOSIS — R2681 Unsteadiness on feet: Secondary | ICD-10-CM | POA: Diagnosis not present

## 2020-02-24 DIAGNOSIS — M6281 Muscle weakness (generalized): Secondary | ICD-10-CM | POA: Diagnosis not present

## 2020-02-27 DIAGNOSIS — G20 Parkinson's disease: Secondary | ICD-10-CM | POA: Diagnosis not present

## 2020-02-27 DIAGNOSIS — D510 Vitamin B12 deficiency anemia due to intrinsic factor deficiency: Secondary | ICD-10-CM | POA: Diagnosis not present

## 2020-02-27 DIAGNOSIS — R1312 Dysphagia, oropharyngeal phase: Secondary | ICD-10-CM | POA: Diagnosis not present

## 2020-02-27 DIAGNOSIS — R358 Other polyuria: Secondary | ICD-10-CM | POA: Diagnosis not present

## 2020-02-27 DIAGNOSIS — R2681 Unsteadiness on feet: Secondary | ICD-10-CM | POA: Diagnosis not present

## 2020-02-27 DIAGNOSIS — M6281 Muscle weakness (generalized): Secondary | ICD-10-CM | POA: Diagnosis not present

## 2020-02-27 DIAGNOSIS — R2689 Other abnormalities of gait and mobility: Secondary | ICD-10-CM | POA: Diagnosis not present

## 2020-02-27 DIAGNOSIS — E1159 Type 2 diabetes mellitus with other circulatory complications: Secondary | ICD-10-CM | POA: Diagnosis not present

## 2020-02-27 DIAGNOSIS — Z7984 Long term (current) use of oral hypoglycemic drugs: Secondary | ICD-10-CM | POA: Diagnosis not present

## 2020-02-27 DIAGNOSIS — R0689 Other abnormalities of breathing: Secondary | ICD-10-CM | POA: Diagnosis not present

## 2020-02-27 DIAGNOSIS — I10 Essential (primary) hypertension: Secondary | ICD-10-CM | POA: Diagnosis not present

## 2020-02-29 DIAGNOSIS — G20 Parkinson's disease: Secondary | ICD-10-CM | POA: Diagnosis not present

## 2020-02-29 DIAGNOSIS — R2689 Other abnormalities of gait and mobility: Secondary | ICD-10-CM | POA: Diagnosis not present

## 2020-02-29 DIAGNOSIS — M6281 Muscle weakness (generalized): Secondary | ICD-10-CM | POA: Diagnosis not present

## 2020-02-29 DIAGNOSIS — R2681 Unsteadiness on feet: Secondary | ICD-10-CM | POA: Diagnosis not present

## 2020-02-29 DIAGNOSIS — R0689 Other abnormalities of breathing: Secondary | ICD-10-CM | POA: Diagnosis not present

## 2020-02-29 DIAGNOSIS — R1312 Dysphagia, oropharyngeal phase: Secondary | ICD-10-CM | POA: Diagnosis not present

## 2020-03-01 DIAGNOSIS — R2689 Other abnormalities of gait and mobility: Secondary | ICD-10-CM | POA: Diagnosis not present

## 2020-03-01 DIAGNOSIS — R2681 Unsteadiness on feet: Secondary | ICD-10-CM | POA: Diagnosis not present

## 2020-03-01 DIAGNOSIS — R1312 Dysphagia, oropharyngeal phase: Secondary | ICD-10-CM | POA: Diagnosis not present

## 2020-03-01 DIAGNOSIS — M6281 Muscle weakness (generalized): Secondary | ICD-10-CM | POA: Diagnosis not present

## 2020-03-01 DIAGNOSIS — G20 Parkinson's disease: Secondary | ICD-10-CM | POA: Diagnosis not present

## 2020-03-01 DIAGNOSIS — R0689 Other abnormalities of breathing: Secondary | ICD-10-CM | POA: Diagnosis not present

## 2020-03-02 DIAGNOSIS — R2689 Other abnormalities of gait and mobility: Secondary | ICD-10-CM | POA: Diagnosis not present

## 2020-03-02 DIAGNOSIS — R1312 Dysphagia, oropharyngeal phase: Secondary | ICD-10-CM | POA: Diagnosis not present

## 2020-03-02 DIAGNOSIS — R2681 Unsteadiness on feet: Secondary | ICD-10-CM | POA: Diagnosis not present

## 2020-03-02 DIAGNOSIS — M6281 Muscle weakness (generalized): Secondary | ICD-10-CM | POA: Diagnosis not present

## 2020-03-02 DIAGNOSIS — G20 Parkinson's disease: Secondary | ICD-10-CM | POA: Diagnosis not present

## 2020-03-02 DIAGNOSIS — R0689 Other abnormalities of breathing: Secondary | ICD-10-CM | POA: Diagnosis not present

## 2020-03-05 DIAGNOSIS — R0689 Other abnormalities of breathing: Secondary | ICD-10-CM | POA: Diagnosis not present

## 2020-03-05 DIAGNOSIS — G20 Parkinson's disease: Secondary | ICD-10-CM | POA: Diagnosis not present

## 2020-03-05 DIAGNOSIS — R2681 Unsteadiness on feet: Secondary | ICD-10-CM | POA: Diagnosis not present

## 2020-03-05 DIAGNOSIS — M6281 Muscle weakness (generalized): Secondary | ICD-10-CM | POA: Diagnosis not present

## 2020-03-05 DIAGNOSIS — R1312 Dysphagia, oropharyngeal phase: Secondary | ICD-10-CM | POA: Diagnosis not present

## 2020-03-05 DIAGNOSIS — R2689 Other abnormalities of gait and mobility: Secondary | ICD-10-CM | POA: Diagnosis not present

## 2020-03-06 DIAGNOSIS — R2689 Other abnormalities of gait and mobility: Secondary | ICD-10-CM | POA: Diagnosis not present

## 2020-03-06 DIAGNOSIS — G20 Parkinson's disease: Secondary | ICD-10-CM | POA: Diagnosis not present

## 2020-03-06 DIAGNOSIS — R2681 Unsteadiness on feet: Secondary | ICD-10-CM | POA: Diagnosis not present

## 2020-03-06 DIAGNOSIS — M6281 Muscle weakness (generalized): Secondary | ICD-10-CM | POA: Diagnosis not present

## 2020-03-09 ENCOUNTER — Other Ambulatory Visit: Payer: Self-pay | Admitting: Nurse Practitioner

## 2020-03-09 DIAGNOSIS — E1159 Type 2 diabetes mellitus with other circulatory complications: Secondary | ICD-10-CM

## 2020-03-09 DIAGNOSIS — E785 Hyperlipidemia, unspecified: Secondary | ICD-10-CM

## 2020-03-09 DIAGNOSIS — R1310 Dysphagia, unspecified: Secondary | ICD-10-CM

## 2020-03-09 NOTE — Telephone Encounter (Signed)
Requested medication (s) are due for refill today: yes   Requested medication (s) are on the active medication list: yes   Last refill: 02/10/2020   Future visit scheduled: no   Notes to clinic:  Patient is due for a follow up appointment    Requested Prescriptions  Pending Prescriptions Disp Refills   metoprolol succinate (TOPROL-XL) 50 MG 24 hr tablet [Pharmacy Med Name: METOPROLOL SUCCINATE ER 50 MG TAB] 30 tablet 2    Sig: TAKE 1 TABLET BY MOUTH DAILY      Cardiovascular:  Beta Blockers Failed - 03/09/2020  2:42 PM      Failed - Last BP in normal range    BP Readings from Last 1 Encounters:  09/22/19 (!) 102/49          Failed - Valid encounter within last 6 months    Recent Outpatient Visits           1 year ago Type 2 diabetes mellitus with other circulatory complication, without long-term current use of insulin ([place])   [place] [facility], Jeannette How, MD   1 year ago Essential hypertension   [place], Jeannette How, MD   1 year ago Essential hypertension   [place], Jeannette How, MD   2 years ago Hospital discharge follow-up   [facility] Trinna Post, PA-C   2 years ago Essential hypertension   [place], Jeannette How, MD       Future Appointments             In 1 month Brendolyn Patty, MD [place]   In 3 months [place], MD [facility], [facility]            Passed - Last Heart Rate in normal range    Pulse Readings from Last 1 Encounters:  09/22/19 62            rosuvastatin (CRESTOR) 10 MG tablet [Pharmacy Med Name: ROSUVASTATIN CALCIUM 10 MG TAB] 30 tablet 2    Sig: TAKE 1 TABLET BY MOUTH DAILY      Cardiovascular:  Antilipid - Statins Failed - 03/09/2020  2:42 PM      Failed - Total Cholesterol in normal range and within 360 days    Cholesterol, Total  Date Value Ref Range Status  12/10/2017 116 100 - 199 mg/dL Final    Cholesterol  Date Value Ref Range Status  12/25/2011 109 0 - 200 mg/dL Final   Cholesterol Piccolo, Waived  Date Value Ref Range Status  08/19/2017 127 <200 mg/dL Final    Comment:                            Desirable                <200                         Borderline High      200- 239                         High                     >239           Failed - LDL in normal range and within 360 days    Ldl  Cholesterol, Calc  Date Value Ref Range Status  12/25/2011 41 0 - 100 mg/dL Final   LDL Calculated  Date Value Ref Range Status  12/10/2017 21 0 - 99 mg/dL Final          Failed - HDL in normal range and within 360 days    HDL Cholesterol  Date Value Ref Range Status  12/25/2011 29 (L) 40 - 60 mg/dL Final   HDL  Date Value Ref Range Status  12/10/2017 32 (L) >39 mg/dL Final          Failed - Triglycerides in normal range and within 360 days    Triglycerides  Date Value Ref Range Status  12/10/2017 317 (H) 0 - 149 mg/dL Final  12/25/2011 195 0 - 200 mg/dL Final   Triglycerides Piccolo,Waived  Date Value Ref Range Status  08/19/2017 401 (H) <150 mg/dL Final    Comment:                            Normal                   <150                         Borderline High     150 - 199                         High                200 - 499                         Very High                >499           Failed - Valid encounter within last 12 months    Recent Outpatient Visits           1 year ago Type 2 diabetes mellitus with other circulatory complication, without long-term current use of insulin ([place])   [facility] Crissman, Jeannette How, MD   1 year ago Essential hypertension   [place], Jeannette How, MD   1 year ago Essential hypertension   [place], Jeannette How, MD   2 years ago Hospital discharge follow-up   [facility] Trinna Post, PA-C   2 years ago Essential hypertension    [place], Jeannette How, MD       Future Appointments             In 1 month Brendolyn Patty, MD [place]   In 3 months [place], Kathlene November, MD [facility], [place] - Patient is not pregnant        ELIQUIS 5 MG TABS tablet [Pharmacy Med Name: ELIQUIS 5 MG TAB] 60 tablet 5    Sig: TAKE 1 TABLET BY MOUTH TWICE DAILY      Hematology:  Anticoagulants Failed - 03/09/2020  2:42 PM      Failed - HGB in normal range and within 360 days    Hemoglobin  Date Value Ref Range Status  09/22/2019 11.4 (L) 13.0 - 17.0 g/dL Final  12/06/2018 12.2 (L) 13.0 - 17.7 g/dL Final  Failed - HCT in normal range and within 360 days    HCT  Date Value Ref Range Status  09/22/2019 35.7 (L) 39 - 52 % Final   Hematocrit  Date Value Ref Range Status  12/06/2018 38.2 37.5 - 51.0 % Final          Failed - Valid encounter within last 12 months    Recent Outpatient Visits           1 year ago Type 2 diabetes mellitus with other circulatory complication, without long-term current use of insulin ([place])   [place] Crissman, Jeannette How, MD   1 year ago Essential hypertension   [place], Jeannette How, MD   1 year ago Essential hypertension   [place], Jeannette How, MD   2 years ago Hospital discharge follow-up   [facility] Trinna Post, PA-C   2 years ago Essential hypertension   [place], MD       Future Appointments             In 1 month Brendolyn Patty, MD [place]   In 3 months [place], MD [place], [facility]            Passed - PLT in normal range and within 360 days    Platelets  Date Value Ref Range Status  09/22/2019 335 150 - 400 K/uL Final  12/06/2018 201 150 - 450 x10E3/uL Final          Passed - Cr in normal range and within 360 days    Creatinine  Date Value  Ref Range Status  12/24/2011 1.30 0.60 - 1.30 mg/dL Final   Creatinine, Ser  Date Value Ref Range Status  09/22/2019 1.12 0.61 - 1.24 mg/dL Final

## 2020-03-09 NOTE — Telephone Encounter (Signed)
Requested medication (s) are due for refill today: yes  Requested medication (s) are on the active medication list: yes  Last refill: 02/10/2020  Future visit scheduled: no  Notes to clinic:  Patient is due for a follow up appointment    Requested Prescriptions  Pending Prescriptions Disp Refills   FARXIGA 10 MG TABS tablet [Pharmacy Med Name: FARXIGA 10 MG TAB] 60 tablet 0    Sig: TAKE 1 TABLET BY MOUTH DAILY      Endocrinology:  Diabetes - SGLT2 Inhibitors Failed - 03/09/2020  2:34 PM      Failed - LDL in normal range and within 360 days    Ldl Cholesterol, Calc  Date Value Ref Range Status  12/25/2011 41 0 - 100 mg/dL Final   LDL Calculated  Date Value Ref Range Status  12/10/2017 21 0 - 99 mg/dL Final          Failed - Valid encounter within last 6 months    Recent Outpatient Visits           1 year ago Type 2 diabetes mellitus with other circulatory complication, without long-term current use of insulin ([place])   [place] Crissman, Jeannette How, MD   1 year ago Essential hypertension   [place], Jeannette How, MD   1 year ago Essential hypertension   [place], Jeannette How, MD   2 years ago Hospital discharge follow-up   [facility] Trinna Post, PA-C   2 years ago Essential hypertension   [place], Jeannette How, MD       Future Appointments             In 1 month Brendolyn Patty, MD [place]   In 3 months [place], MD [place], [facility]            Passed - Cr in normal range and within 360 days    Creatinine  Date Value Ref Range Status  12/24/2011 1.30 0.60 - 1.30 mg/dL Final   Creatinine, Ser  Date Value Ref Range Status  09/22/2019 1.12 0.61 - 1.24 mg/dL Final          Passed - HBA1C is between 0 and 7.9 and within 180 days    Hemoglobin A1C  Date Value Ref Range Status  02/21/2016 7.6  Final   HB A1C (BAYER DCA -  WAIVED)  Date Value Ref Range Status  12/06/2018 7.7 (H) <7.0 % Final    Comment:                                          Diabetic Adult            <7.0                                       Healthy Adult        4.3 - 5.7                                                           (DCCT/NGSP)  American Diabetes Association's Summary of Glycemic Recommendations for Adults with Diabetes: Hemoglobin A1c <7.0%. More stringent glycemic goals (A1c <6.0%) may further reduce complications at the cost of increased risk of hypoglycemia.    Hgb A1c MFr Bld  Date Value Ref Range Status  09/18/2019 7.6 (H) 4.8 - 5.6 % Final    Comment:    (NOTE) Pre diabetes:          5.7%-6.4% Diabetes:              >6.4% Glycemic control for   <7.0% adults with diabetes           Passed - eGFR in normal range and within 360 days    EGFR (African American)  Date Value Ref Range Status  12/24/2011 60 (L)  Final   GFR calc Af Amer  Date Value Ref Range Status  09/22/2019 >60 >60 mL/min Final   EGFR (Non-African Amer.)  Date Value Ref Range Status  12/24/2011 52 (L)  Final    Comment:    eGFR values <29m/min/1.73 m2 may be an indication of chronic kidney disease (CKD). Calculated eGFR is useful in patients with stable renal function. The eGFR calculation will not be reliable in acutely ill patients when serum creatinine is changing rapidly. It is not useful in  patients on dialysis. The eGFR calculation may not be applicable to patients at the low and high extremes of body sizes, pregnant women, and vegetarians.    GFR calc non Af Amer  Date Value Ref Range Status  09/22/2019 58 (L) >60 mL/min Final

## 2020-03-09 NOTE — Telephone Encounter (Signed)
Requested medication (s) are due for refill today: Yes  Requested medication (s) are on the active medication list: Yes  Last refill:  12/16/18  Future visit scheduled: No  Notes to clinic:  Prescription has expired.    Requested Prescriptions  Pending Prescriptions Disp Refills   pantoprazole (PROTONIX) 40 MG tablet [Pharmacy Med Name: PANTOPRAZOLE SODIUM 40 MG DR TAB] 180 tablet 4    Sig: TAKE 1 TABLET BY MOUTH TWICE DAILY      Gastroenterology: Proton Pump Inhibitors Failed - 03/09/2020  2:44 PM      Failed - Valid encounter within last 12 months    Recent Outpatient Visits           1 year ago Type 2 diabetes mellitus with other circulatory complication, without long-term current use of insulin ([place])   [facility] Crissman, Jeannette How, MD   1 year ago Essential hypertension   [place], Jeannette How, MD   1 year ago Essential hypertension   [facility] Crissman, Jeannette How, MD   2 years ago Hospital discharge follow-up   [facility] Trinna Post, PA-C   2 years ago Essential hypertension   [place], Jeannette How, MD       Future Appointments             In 1 month Brendolyn Patty, MD [place]   In 3 months [place], Kathlene November, MD [facility], [place]

## 2020-03-13 NOTE — Telephone Encounter (Signed)
Spoke with pts daughter and she stated that she was under the impression that [facility] would be refilling prescriptions. She stated that she would call back to schedule an appointment if necessary.

## 2020-03-13 NOTE — Telephone Encounter (Signed)
Spoke with pts daughter and she stated that she understood that [employer] would be refilling meds from now on since the pt is at [facility] and she will call that office to find out and she  would call back to schedule an appointment if necessary.

## 2020-03-15 DIAGNOSIS — R2681 Unsteadiness on feet: Secondary | ICD-10-CM | POA: Diagnosis not present

## 2020-03-15 DIAGNOSIS — M6281 Muscle weakness (generalized): Secondary | ICD-10-CM | POA: Diagnosis not present

## 2020-03-15 DIAGNOSIS — R2689 Other abnormalities of gait and mobility: Secondary | ICD-10-CM | POA: Diagnosis not present

## 2020-03-15 DIAGNOSIS — G20 Parkinson's disease: Secondary | ICD-10-CM | POA: Diagnosis not present

## 2020-03-17 DIAGNOSIS — M6281 Muscle weakness (generalized): Secondary | ICD-10-CM | POA: Diagnosis not present

## 2020-03-17 DIAGNOSIS — G20 Parkinson's disease: Secondary | ICD-10-CM | POA: Diagnosis not present

## 2020-03-17 DIAGNOSIS — R2681 Unsteadiness on feet: Secondary | ICD-10-CM | POA: Diagnosis not present

## 2020-03-17 DIAGNOSIS — R2689 Other abnormalities of gait and mobility: Secondary | ICD-10-CM | POA: Diagnosis not present

## 2020-03-20 DIAGNOSIS — G20 Parkinson's disease: Secondary | ICD-10-CM | POA: Diagnosis not present

## 2020-03-20 DIAGNOSIS — M6281 Muscle weakness (generalized): Secondary | ICD-10-CM | POA: Diagnosis not present

## 2020-03-20 DIAGNOSIS — R2689 Other abnormalities of gait and mobility: Secondary | ICD-10-CM | POA: Diagnosis not present

## 2020-03-20 DIAGNOSIS — R2681 Unsteadiness on feet: Secondary | ICD-10-CM | POA: Diagnosis not present

## 2020-03-21 DIAGNOSIS — R2681 Unsteadiness on feet: Secondary | ICD-10-CM | POA: Diagnosis not present

## 2020-03-21 DIAGNOSIS — G20 Parkinson's disease: Secondary | ICD-10-CM | POA: Diagnosis not present

## 2020-03-21 DIAGNOSIS — M6281 Muscle weakness (generalized): Secondary | ICD-10-CM | POA: Diagnosis not present

## 2020-03-21 DIAGNOSIS — R2689 Other abnormalities of gait and mobility: Secondary | ICD-10-CM | POA: Diagnosis not present

## 2020-03-26 DIAGNOSIS — M6281 Muscle weakness (generalized): Secondary | ICD-10-CM | POA: Diagnosis not present

## 2020-03-26 DIAGNOSIS — G20 Parkinson's disease: Secondary | ICD-10-CM | POA: Diagnosis not present

## 2020-03-26 DIAGNOSIS — R2689 Other abnormalities of gait and mobility: Secondary | ICD-10-CM | POA: Diagnosis not present

## 2020-03-26 DIAGNOSIS — R2681 Unsteadiness on feet: Secondary | ICD-10-CM | POA: Diagnosis not present

## 2020-03-27 DIAGNOSIS — M6281 Muscle weakness (generalized): Secondary | ICD-10-CM | POA: Diagnosis not present

## 2020-03-27 DIAGNOSIS — G20 Parkinson's disease: Secondary | ICD-10-CM | POA: Diagnosis not present

## 2020-03-27 DIAGNOSIS — R2681 Unsteadiness on feet: Secondary | ICD-10-CM | POA: Diagnosis not present

## 2020-03-27 DIAGNOSIS — R2689 Other abnormalities of gait and mobility: Secondary | ICD-10-CM | POA: Diagnosis not present

## 2020-03-29 DIAGNOSIS — R2681 Unsteadiness on feet: Secondary | ICD-10-CM | POA: Diagnosis not present

## 2020-03-29 DIAGNOSIS — M6281 Muscle weakness (generalized): Secondary | ICD-10-CM | POA: Diagnosis not present

## 2020-03-29 DIAGNOSIS — R2689 Other abnormalities of gait and mobility: Secondary | ICD-10-CM | POA: Diagnosis not present

## 2020-03-29 DIAGNOSIS — G20 Parkinson's disease: Secondary | ICD-10-CM | POA: Diagnosis not present

## 2020-04-02 DIAGNOSIS — M6281 Muscle weakness (generalized): Secondary | ICD-10-CM | POA: Diagnosis not present

## 2020-04-02 DIAGNOSIS — G20 Parkinson's disease: Secondary | ICD-10-CM | POA: Diagnosis not present

## 2020-04-02 DIAGNOSIS — R2681 Unsteadiness on feet: Secondary | ICD-10-CM | POA: Diagnosis not present

## 2020-04-02 DIAGNOSIS — R2689 Other abnormalities of gait and mobility: Secondary | ICD-10-CM | POA: Diagnosis not present

## 2020-04-03 DIAGNOSIS — G20 Parkinson's disease: Secondary | ICD-10-CM | POA: Diagnosis not present

## 2020-04-03 DIAGNOSIS — R2681 Unsteadiness on feet: Secondary | ICD-10-CM | POA: Diagnosis not present

## 2020-04-03 DIAGNOSIS — R2689 Other abnormalities of gait and mobility: Secondary | ICD-10-CM | POA: Diagnosis not present

## 2020-04-03 DIAGNOSIS — M6281 Muscle weakness (generalized): Secondary | ICD-10-CM | POA: Diagnosis not present

## 2020-04-05 DIAGNOSIS — R2689 Other abnormalities of gait and mobility: Secondary | ICD-10-CM | POA: Diagnosis not present

## 2020-04-05 DIAGNOSIS — M6281 Muscle weakness (generalized): Secondary | ICD-10-CM | POA: Diagnosis not present

## 2020-04-05 DIAGNOSIS — R2681 Unsteadiness on feet: Secondary | ICD-10-CM | POA: Diagnosis not present

## 2020-04-05 DIAGNOSIS — G20 Parkinson's disease: Secondary | ICD-10-CM | POA: Diagnosis not present

## 2020-04-10 DIAGNOSIS — R2681 Unsteadiness on feet: Secondary | ICD-10-CM | POA: Diagnosis not present

## 2020-04-10 DIAGNOSIS — M6281 Muscle weakness (generalized): Secondary | ICD-10-CM | POA: Diagnosis not present

## 2020-04-10 DIAGNOSIS — G20 Parkinson's disease: Secondary | ICD-10-CM | POA: Diagnosis not present

## 2020-04-10 DIAGNOSIS — R2689 Other abnormalities of gait and mobility: Secondary | ICD-10-CM | POA: Diagnosis not present

## 2020-04-11 ENCOUNTER — Other Ambulatory Visit: Payer: Self-pay | Admitting: Nurse Practitioner

## 2020-04-11 DIAGNOSIS — R2681 Unsteadiness on feet: Secondary | ICD-10-CM | POA: Diagnosis not present

## 2020-04-11 DIAGNOSIS — G20 Parkinson's disease: Secondary | ICD-10-CM | POA: Diagnosis not present

## 2020-04-11 DIAGNOSIS — M6281 Muscle weakness (generalized): Secondary | ICD-10-CM | POA: Diagnosis not present

## 2020-04-11 DIAGNOSIS — R2689 Other abnormalities of gait and mobility: Secondary | ICD-10-CM | POA: Diagnosis not present

## 2020-04-11 DIAGNOSIS — E1159 Type 2 diabetes mellitus with other circulatory complications: Secondary | ICD-10-CM

## 2020-04-11 NOTE — Telephone Encounter (Signed)
Please Advise. Last office visit 06/14/2018.  KP

## 2020-04-11 NOTE — Telephone Encounter (Signed)
Called pt daughter DPR left VM to call back.  Will route result note to [facility] Nurse Triage for follow up when patient returns call to clinic. CRM created.  KP

## 2020-04-12 DIAGNOSIS — G20 Parkinson's disease: Secondary | ICD-10-CM | POA: Diagnosis not present

## 2020-04-12 DIAGNOSIS — R2681 Unsteadiness on feet: Secondary | ICD-10-CM | POA: Diagnosis not present

## 2020-04-12 DIAGNOSIS — M6281 Muscle weakness (generalized): Secondary | ICD-10-CM | POA: Diagnosis not present

## 2020-04-12 DIAGNOSIS — R2689 Other abnormalities of gait and mobility: Secondary | ICD-10-CM | POA: Diagnosis not present

## 2020-04-12 NOTE — Telephone Encounter (Signed)
Pt is in [place] daughter stated she is not interested into making an apt at this time due to doctors making house calls at [facility].Pt daughters stated she would call back when she needs [place] and right now she is expecting the doctors making house calls to handle his medications.  Pt daughter stated last apt was on 12/16/2018.

## 2020-04-12 NOTE — Telephone Encounter (Signed)
Also PT's daughter stated that pts carbidopa-levodopa medication will be needed a refill and is unsure if we can refill that medication.

## 2020-04-13 NOTE — Telephone Encounter (Signed)
Noted  KP 

## 2020-04-16 NOTE — Telephone Encounter (Signed)
Pt;s daughter per dpr stated doctor making house calls would be refilling the carbidopa- levodopa medication and did not want to move pcp to the house call doctor at this time. Pt's daughter aware pt cant have two PCP's .

## 2020-04-23 DIAGNOSIS — E1159 Type 2 diabetes mellitus with other circulatory complications: Secondary | ICD-10-CM | POA: Diagnosis not present

## 2020-04-23 DIAGNOSIS — Z7984 Long term (current) use of oral hypoglycemic drugs: Secondary | ICD-10-CM | POA: Diagnosis not present

## 2020-04-23 DIAGNOSIS — R600 Localized edema: Secondary | ICD-10-CM | POA: Diagnosis not present

## 2020-04-23 DIAGNOSIS — K219 Gastro-esophageal reflux disease without esophagitis: Secondary | ICD-10-CM | POA: Diagnosis not present

## 2020-04-23 DIAGNOSIS — I2510 Atherosclerotic heart disease of native coronary artery without angina pectoris: Secondary | ICD-10-CM | POA: Diagnosis not present

## 2020-04-23 DIAGNOSIS — I10 Essential (primary) hypertension: Secondary | ICD-10-CM | POA: Diagnosis not present

## 2020-04-23 DIAGNOSIS — Z79899 Other long term (current) drug therapy: Secondary | ICD-10-CM | POA: Diagnosis not present

## 2020-04-23 DIAGNOSIS — S0001XA Abrasion of scalp, initial encounter: Secondary | ICD-10-CM | POA: Diagnosis not present

## 2020-04-23 DIAGNOSIS — G20 Parkinson's disease: Secondary | ICD-10-CM | POA: Diagnosis not present

## 2020-04-23 DIAGNOSIS — R2681 Unsteadiness on feet: Secondary | ICD-10-CM | POA: Diagnosis not present

## 2020-04-30 ENCOUNTER — Ambulatory Visit (INDEPENDENT_AMBULATORY_CARE_PROVIDER_SITE_OTHER): Payer: Medicare Other | Admitting: Dermatology

## 2020-04-30 ENCOUNTER — Other Ambulatory Visit: Payer: Self-pay

## 2020-04-30 DIAGNOSIS — L570 Actinic keratosis: Secondary | ICD-10-CM

## 2020-04-30 DIAGNOSIS — Z85828 Personal history of other malignant neoplasm of skin: Secondary | ICD-10-CM | POA: Diagnosis not present

## 2020-04-30 DIAGNOSIS — L821 Other seborrheic keratosis: Secondary | ICD-10-CM

## 2020-04-30 DIAGNOSIS — D485 Neoplasm of uncertain behavior of skin: Secondary | ICD-10-CM

## 2020-04-30 DIAGNOSIS — L578 Other skin changes due to chronic exposure to nonionizing radiation: Secondary | ICD-10-CM | POA: Diagnosis not present

## 2020-04-30 DIAGNOSIS — L219 Seborrheic dermatitis, unspecified: Secondary | ICD-10-CM

## 2020-04-30 DIAGNOSIS — Z86007 Personal history of in-situ neoplasm of skin: Secondary | ICD-10-CM | POA: Diagnosis not present

## 2020-04-30 DIAGNOSIS — D1800 Hemangioma unspecified site: Secondary | ICD-10-CM

## 2020-04-30 DIAGNOSIS — C44319 Basal cell carcinoma of skin of other parts of face: Secondary | ICD-10-CM | POA: Diagnosis not present

## 2020-04-30 DIAGNOSIS — Z23 Encounter for immunization: Secondary | ICD-10-CM | POA: Diagnosis not present

## 2020-04-30 MED ORDER — HYDROCORTISONE 2.5 % EX CREA: TOPICAL_CREAM | CUTANEOUS | 2 refills | Status: DC

## 2020-04-30 NOTE — Progress Notes (Signed)
Follow-Up Visit   Subjective  Timothy [name] is a 86 y.o. male who presents for the following: Lesions.  Patient here today to have some places at his scalp checked. Scalp stays scaly and he picks at sores.  His daughter would like him checked all over since they're here today. He does have a h/o AK's, BCC and SCCis.   The following portions of the chart were reviewed this encounter and updated as appropriate:      Review of Systems:  No other skin or systemic complaints except as noted in HPI or Assessment and Plan.  Objective  Well appearing patient in no apparent distress; mood and affect are within normal limits.  A focused examination was performed including face, scalp, trunk, arms. Relevant physical exam findings are noted in the Assessment and Plan.  Objective  Scalp x 6, left hand dorsum x 1, right temple x 1 (8): Erythematous thin papules/macules with gritty scale.   Objective  Scalp: Mild scaling at scalp, pink scaliness glabella and bilateral alar crease, light pink scaly patch at post neck  Objective  Left cheek: 81mm pink flesh papule      Assessment & Plan  AK (actinic keratosis) (8) Scalp x 6, left hand dorsum x 1, right temple x 1  Vs ISK  Destruction of lesion - Scalp x 6, left hand dorsum x 1, right temple x 1  Destruction method: cryotherapy   Informed consent: discussed and consent obtained   Lesion destroyed using liquid nitrogen: Yes   Region frozen until ice ball extended beyond lesion: Yes   Outcome: patient tolerated procedure well with no complications   Post-procedure details: wound care instructions given    Seborrheic dermatitis Scalp  Recommend Head & Shoulders when washing hair. Start HC 2.5% cream to affected areas at face/neck 1-2 times daily as needed for itchy rash.   Ordered Medications: hydrocortisone 2.5 % cream  Neoplasm of uncertain behavior of skin Left cheek  Skin / nail biopsy Type of biopsy: tangential     Informed consent: discussed and consent obtained   Patient was prepped and draped in usual sterile fashion: Area prepped with alcohol. Anesthesia: the lesion was anesthetized in a standard fashion   Anesthetic:  1% lidocaine w/ epinephrine 1-100,000 buffered w/ 8.4% NaHCO3 Instrument used: flexible razor blade   Hemostasis achieved with: pressure and aluminum chloride   Outcome: patient tolerated procedure well   Post-procedure details: wound care instructions given   Post-procedure details comment:  Ointment and small bandage applied  Specimen 1 - Surgical pathology Differential Diagnosis: Irritated Nevus r/o BCC Check Margins: No 27mm pink flesh papule   History of Basal Cell Carcinoma of the Skin - No evidence of recurrence today at left mid ear helix - Recommend regular full body skin exams - Recommend daily broad spectrum sunscreen SPF 30+ to sun-exposed areas, reapply every 2 hours as needed.  - Call if any new or changing lesions are noted between office visits  History of Squamous Cell Carcinoma in Situ of the Skin - No evidence of recurrence today at left cheek - Recommend regular full body skin exams - Recommend daily broad spectrum sunscreen SPF 30+ to sun-exposed areas, reapply every 2 hours as needed.  - Call if any new or changing lesions are noted between office visits  Hemangiomas - Red papules - Discussed benign nature - Observe - Call for any changes  Seborrheic Keratoses - Stuck-on, waxy, tan-brown papules and plaques  - Discussed benign  etiology and prognosis. - Observe - Call for any changes  Actinic Damage - diffuse scaly erythematous macules with underlying dyspigmentation - Recommend daily broad spectrum sunscreen SPF 30+ to sun-exposed areas, reapply every 2 hours as needed.  - Call for new or changing lesions.  Return if symptoms worsen or fail to improve, for sooner pending bx.  Graciella Belton, RMA, am acting as scribe for Brendolyn Patty, MD  . Documentation: I have reviewed the above documentation for accuracy and completeness, and I agree with the above.  Brendolyn Patty MD

## 2020-04-30 NOTE — Patient Instructions (Addendum)
  Cryotherapy Aftercare  . Wash gently with soap and water everyday.   Marland Kitchen Apply Vaseline and Band-Aid daily until healed.  Recommend using Head & Shoulders when washing hair. Massage into scalp and let sit a few minutes before rinsing out.  Start HC 2.5% cream to affected area at neck 1-2 times daily as needed for itch.   Wound Care Instructions  1. Cleanse wound gently with soap and water once a day then pat dry with clean gauze. Apply a thing coat of Petrolatum (petroleum jelly, "Vaseline") over the wound (unless you have an allergy to this). We recommend that you use a new, sterile tube of Vaseline. Do not pick or remove scabs. Do not remove the yellow or white "healing tissue" from the base of the wound.  2. Cover the wound with fresh, clean, nonstick gauze and secure with paper tape. You may use Band-Aids in place of gauze and tape if the would is small enough, but would recommend trimming much of the tape off as there is often too much. Sometimes Band-Aids can irritate the skin.  3. You should call the office for your biopsy report after 1 week if you have not already been contacted.  4. If you experience any problems, such as abnormal amounts of bleeding, swelling, significant bruising, significant pain, or evidence of infection, please call the office immediately.  5. FOR ADULT SURGERY PATIENTS: If you need something for pain relief you may take 1 extra strength Tylenol (acetaminophen) AND 2 Ibuprofen (200mg  each) together every 4 hours as needed for pain. (do not take these if you are allergic to them or if you have a reason you should not take them.) Typically, you may only need pain medication for 1 to 3 days.

## 2020-05-05 ENCOUNTER — Other Ambulatory Visit: Payer: Self-pay | Admitting: Nurse Practitioner

## 2020-05-05 DIAGNOSIS — E1159 Type 2 diabetes mellitus with other circulatory complications: Secondary | ICD-10-CM

## 2020-05-05 NOTE — Telephone Encounter (Signed)
Called and spoke with pt's daughter. Pt's daughter sated that her father is in assisted living and she said that she is not sure if "Dr's making house calls" will be taking over prescriptions. Daughter asked if a short term RF could be provided. Pt's daughter stated that she will call office if prescriptions will be taken over by Doctors making house calls. Office number provided.   Farxiga: Last RF: 03/09/20  RF due: yes  active med list: yes   Metaglip: Last RF: 04/11/20   RF due: yes   Active med list: yes    Januvia: Last RF: 01/17/20  RF due: yes   Active med list: yes

## 2020-05-07 ENCOUNTER — Telehealth: Payer: Self-pay

## 2020-05-07 ENCOUNTER — Other Ambulatory Visit: Payer: Self-pay | Admitting: Nurse Practitioner

## 2020-05-07 DIAGNOSIS — E1159 Type 2 diabetes mellitus with other circulatory complications: Secondary | ICD-10-CM

## 2020-05-07 NOTE — Telephone Encounter (Signed)
Provider has already taken care of this request therefore refusing this request.

## 2020-05-07 NOTE — Telephone Encounter (Signed)
-----   Message from Brendolyn Patty, MD sent at 05/07/2020  8:57 AM EDT ----- Skin , left cheek BASAL CELL CARCINOMA, SUPERFICIAL AND NODULAR PATTERNS  BCC skin cancer- needs Excision

## 2020-05-07 NOTE — Telephone Encounter (Signed)
Tried to call patient's daughter back. No answer and voicemail full. Copy of Pre-Op Instructions sent to patient's MyChart.

## 2020-05-07 NOTE — Telephone Encounter (Signed)
Daughter called back after speaking with cardiologist. Their office states we have to send a request over to (fax) [phone] and let that run through their process. I am more then happy to help if someone can just relay what exactly we are needing. Thank you!

## 2020-05-07 NOTE — Telephone Encounter (Signed)
Please see message below from the [facility].

## 2020-05-07 NOTE — Telephone Encounter (Signed)
Talked to patient's daughter, Karmen Bongo, and advised her that her father's biopsy on the left cheek was BCC. Surgery scheduled for 05/14/2020 at 1:30pm. She will check with cardiologist about patient stopping blood thinner prior to surgery.

## 2020-05-08 NOTE — Telephone Encounter (Signed)
Patient's daughter called again this morning stating the exact details of our procedure have to go directly to the patient's cardiologist. The patient's cardiologist is a [facility] provider and get see all of Dr. Les Pou notes.  At this point, per [facility], patient can stay on his blood thinner. We are less then a week out and Dr. Nicole Kindred is not doing a major surgery, just a minor skin procedure. Daughter advised to keep father on medications but to be aware he might bruise and bleed a little more then normal.

## 2020-05-14 ENCOUNTER — Other Ambulatory Visit: Payer: Self-pay

## 2020-05-14 ENCOUNTER — Ambulatory Visit (INDEPENDENT_AMBULATORY_CARE_PROVIDER_SITE_OTHER): Payer: Medicare Other | Admitting: Dermatology

## 2020-05-14 DIAGNOSIS — C44319 Basal cell carcinoma of skin of other parts of face: Secondary | ICD-10-CM

## 2020-05-14 NOTE — Progress Notes (Signed)
   Follow-Up Visit   Subjective  Timothy [name] is a 86 y.o. male who presents for the following: BCC bx proven (L cheek, pt presents for excision today).  Patient accompanied by daughter who contributes to history.  The following portions of the chart were reviewed this encounter and updated as appropriate:      Review of Systems:  No other skin or systemic complaints except as noted in HPI or Assessment and Plan.  Objective  Well appearing patient in no apparent distress; mood and affect are within normal limits.  A focused examination was performed including face. Relevant physical exam findings are noted in the Assessment and Plan.  Objective  Left cheek: Pink bx site 10.0 x 8.49mm   Assessment & Plan  Basal cell carcinoma (BCC) of skin of other part of face Left cheek  Skin excision  Lesion length (cm):  1 Lesion width (cm):  0.8 Margin per side (cm):  0.2 Total excision diameter (cm):  1.4 Informed consent: discussed and consent obtained   Timeout: patient name, date of birth, surgical site, and procedure verified   Procedure prep:  Patient was prepped and draped in usual sterile fashion Prep type:  Povidone-iodine Anesthesia: the lesion was anesthetized in a standard fashion   Anesthetic:  1% lidocaine w/ epinephrine 1-100,000 buffered w/ 8.4% NaHCO3 (13.0cc) Instrument used comment:  15c blade Hemostasis achieved with: pressure and electrodesiccation   Outcome: patient tolerated procedure well with no complications   Additional details:  Tag at superior tip.  Skin repair Complexity:  Complex Final length (cm):  3 Reason for type of repair: reduce tension to allow closure, reduce the risk of dehiscence, infection, and necrosis, reduce subcutaneous dead space and avoid a hematoma, preserve normal anatomy, preserve normal anatomical and functional relationships and enhance both functionality and cosmetic results   Undermining: area extensively undermined     Undermining comment:  Undermining Defect 1.5 cm medial Subcutaneous layers (deep stitches):  Suture size:  5-0 Suture type: Vicryl (polyglactin 910)   Subcutaneous suture technique: Inverted Dermal. Fine/surface layer approximation (top stitches):  Suture size:  5-0 Suture type: nylon   Stitches: simple interrupted   Suture removal (days):  7 Hemostasis achieved with: pressure Outcome: patient tolerated procedure well with no complications   Post-procedure details: sterile dressing applied and wound care instructions given   Dressing type: bandage and pressure dressing (Mupirocin)    Specimen 1 - Surgical pathology Differential Diagnosis: C44.319 BCC Check Margins: yes Pink bx site 10.0 x 8.23mm [id number] Tag at superior tip.  Return in about 1 week (around 05/21/2020) for suture removal.   I, Sonya Hupman, RMA, am acting as scribe for Brendolyn Patty, MD .  Documentation: I have reviewed the above documentation for accuracy and completeness, and I agree with the above.  Brendolyn Patty MD

## 2020-05-14 NOTE — Patient Instructions (Signed)

## 2020-05-15 ENCOUNTER — Telehealth: Payer: Self-pay

## 2020-05-15 NOTE — Telephone Encounter (Signed)
Spoke to patient's daughter and she has not spoken to her father today.  She said she will call him after dinner and if any problems from yesterday's surgery she will give [place] a call back./sh

## 2020-05-16 ENCOUNTER — Other Ambulatory Visit: Payer: Self-pay | Admitting: Cardiovascular Disease

## 2020-05-16 DIAGNOSIS — I70201 Unspecified atherosclerosis of native arteries of extremities, right leg: Secondary | ICD-10-CM

## 2020-05-17 DIAGNOSIS — Z23 Encounter for immunization: Secondary | ICD-10-CM | POA: Diagnosis not present

## 2020-05-21 ENCOUNTER — Other Ambulatory Visit: Payer: Self-pay

## 2020-05-21 ENCOUNTER — Ambulatory Visit (INDEPENDENT_AMBULATORY_CARE_PROVIDER_SITE_OTHER): Payer: Medicare Other | Admitting: Dermatology

## 2020-05-21 DIAGNOSIS — I10 Essential (primary) hypertension: Secondary | ICD-10-CM | POA: Diagnosis not present

## 2020-05-21 DIAGNOSIS — Z7984 Long term (current) use of oral hypoglycemic drugs: Secondary | ICD-10-CM | POA: Diagnosis not present

## 2020-05-21 DIAGNOSIS — I2510 Atherosclerotic heart disease of native coronary artery without angina pectoris: Secondary | ICD-10-CM | POA: Diagnosis not present

## 2020-05-21 DIAGNOSIS — G20 Parkinson's disease: Secondary | ICD-10-CM | POA: Diagnosis not present

## 2020-05-21 DIAGNOSIS — E1159 Type 2 diabetes mellitus with other circulatory complications: Secondary | ICD-10-CM | POA: Diagnosis not present

## 2020-05-21 DIAGNOSIS — C44319 Basal cell carcinoma of skin of other parts of face: Secondary | ICD-10-CM

## 2020-05-21 DIAGNOSIS — D510 Vitamin B12 deficiency anemia due to intrinsic factor deficiency: Secondary | ICD-10-CM | POA: Diagnosis not present

## 2020-05-21 DIAGNOSIS — R2681 Unsteadiness on feet: Secondary | ICD-10-CM | POA: Diagnosis not present

## 2020-05-21 DIAGNOSIS — Z79899 Other long term (current) drug therapy: Secondary | ICD-10-CM | POA: Diagnosis not present

## 2020-05-21 DIAGNOSIS — S90811A Abrasion, right foot, initial encounter: Secondary | ICD-10-CM | POA: Diagnosis not present

## 2020-05-21 DIAGNOSIS — I498 Other specified cardiac arrhythmias: Secondary | ICD-10-CM | POA: Diagnosis not present

## 2020-05-21 DIAGNOSIS — Z4802 Encounter for removal of sutures: Secondary | ICD-10-CM

## 2020-05-21 NOTE — Progress Notes (Signed)
   Follow-Up Visit   Subjective  Timothy [name] is a 86 y.o. male who presents for the following: Post op (BCC of the left cheek, margins free).   The following portions of the chart were reviewed this encounter and updated as appropriate:      Review of Systems:  No other skin or systemic complaints except as noted in HPI or Assessment and Plan.  Objective  Well appearing patient in no apparent distress; mood and affect are within normal limits.  A focused examination was performed including face. Relevant physical exam findings are noted in the Assessment and Plan.  Objective  Left Cheek: Excision site healing well, no evidence of infection    Assessment & Plan  Basal cell carcinoma (BCC) of skin of other part of face Left Cheek  BCC, margins free, biopsy proven  Wound cleansed, sutures removed, wound cleansed and steri strips applied. Discussed pathology results.   Return in about 3 months (around 08/21/2020) for AKs, Hx BCC.  IJamesetta Orleans, CMA, am acting as scribe for Brendolyn Patty, MD .  Documentation: I have reviewed the above documentation for accuracy and completeness, and I agree with the above.  Brendolyn Patty MD

## 2020-05-24 ENCOUNTER — Ambulatory Visit (INDEPENDENT_AMBULATORY_CARE_PROVIDER_SITE_OTHER): Payer: Medicare Other

## 2020-05-24 ENCOUNTER — Other Ambulatory Visit: Payer: Self-pay

## 2020-05-24 DIAGNOSIS — I70201 Unspecified atherosclerosis of native arteries of extremities, right leg: Secondary | ICD-10-CM

## 2020-05-28 ENCOUNTER — Telehealth: Payer: Self-pay | Admitting: *Deleted

## 2020-06-07 ENCOUNTER — Other Ambulatory Visit: Payer: Self-pay | Admitting: Nurse Practitioner

## 2020-06-07 DIAGNOSIS — E1159 Type 2 diabetes mellitus with other circulatory complications: Secondary | ICD-10-CM

## 2020-06-07 NOTE — Telephone Encounter (Signed)
Requested medication (s) are due for refill today: yes  Requested medication (s) are on the active medication list: yes   Last refill: 05/07/2020  Future visit scheduled: no  Notes to clinic:  overdue for office visit Vm left for patient to callback and schedule visit    Requested Prescriptions  Pending Prescriptions Disp Refills   glipiZIDE-metformin (METAGLIP) 5-500 MG tablet [Pharmacy Med Name: GLIPIZIDE-METFORMIN HCL 5-500 MG TA] 60 tablet 0    Sig: TAKE ONE TABLET BY MOUTH TWICE DAILY      Endocrinology:  Diabetes - Biguanide + Sulfonylurea Combos Failed - 06/07/2020  2:10 PM      Failed - HBA1C is between 0 and 7.9 and within 180 days    Hemoglobin A1C  Date Value Ref Range Status  02/21/2016 7.6  Final   HB A1C (BAYER DCA - WAIVED)  Date Value Ref Range Status  12/06/2018 7.7 (H) <7.0 % Final    Comment:                                          Diabetic Adult            <7.0                                       Healthy Adult        4.3 - 5.7                                                           (DCCT/NGSP) American Diabetes Association's Summary of Glycemic Recommendations for Adults with Diabetes: Hemoglobin A1c <7.0%. More stringent glycemic goals (A1c <6.0%) may further reduce complications at the cost of increased risk of hypoglycemia.    Hgb A1c MFr Bld  Date Value Ref Range Status  09/18/2019 7.6 (H) 4.8 - 5.6 % Final    Comment:    (NOTE) Pre diabetes:          5.7%-6.4% Diabetes:              >6.4% Glycemic control for   <7.0% adults with diabetes           Failed - eGFR in normal range and within 360 days    EGFR (African American)  Date Value Ref Range Status  12/24/2011 60 (L)  Final   GFR calc Af Amer  Date Value Ref Range Status  09/22/2019 >60 >60 mL/min Final   EGFR (Non-African Amer.)  Date Value Ref Range Status  12/24/2011 52 (L)  Final    Comment:    eGFR values <39m/min/1.73 m2 may be an indication of chronic kidney  disease (CKD). Calculated eGFR is useful in patients with stable renal function. The eGFR calculation will not be reliable in acutely ill patients when serum creatinine is changing rapidly. It is not useful in  patients on dialysis. The eGFR calculation may not be applicable to patients at the low and high extremes of body sizes, pregnant women, and vegetarians.    GFR calc non Af Amer  Date Value Ref Range Status  09/22/2019 58 (L) >60 mL/min Final  Failed - Valid encounter within last 6 months    Recent Outpatient Visits           1 year ago Type 2 diabetes mellitus with other circulatory complication, without long-term current use of insulin ([place])   [facility] [place], Jeannette How, MD   1 year ago Essential hypertension   [place], Jeannette How, MD   2 years ago Essential hypertension   [place], Jeannette How, MD   2 years ago Hospital discharge follow-up   [facility] Trinna Post, PA-C   2 years ago Essential hypertension   [place], MD       Future Appointments             In 2 weeks Gollan, Kathlene November, MD [facility] [place]   In 2 months Brendolyn Patty, MD [place] in normal range and within 360 days    Creatinine  Date Value Ref Range Status  12/24/2011 1.30 0.60 - 1.30 mg/dL Final   Creatinine, Ser  Date Value Ref Range Status  09/22/2019 1.12 0.61 - 1.24 mg/dL Final

## 2020-06-07 NOTE — Telephone Encounter (Signed)
Nurse has left a message for patient to call and schedule a follow up.

## 2020-06-08 NOTE — Telephone Encounter (Signed)
Per daughter (dpr) stated she will call pharmacy to make aware that the assisting living facility will be refilling medication the patient's daughter refused to Schedule apt with [place] as she says doctors making house calls will be assisting pt.

## 2020-06-12 ENCOUNTER — Other Ambulatory Visit: Payer: Self-pay | Admitting: Cardiovascular Disease

## 2020-06-13 DIAGNOSIS — R2681 Unsteadiness on feet: Secondary | ICD-10-CM | POA: Diagnosis not present

## 2020-06-13 DIAGNOSIS — R1312 Dysphagia, oropharyngeal phase: Secondary | ICD-10-CM | POA: Diagnosis not present

## 2020-06-13 DIAGNOSIS — G20 Parkinson's disease: Secondary | ICD-10-CM | POA: Diagnosis not present

## 2020-06-13 DIAGNOSIS — R262 Difficulty in walking, not elsewhere classified: Secondary | ICD-10-CM | POA: Diagnosis not present

## 2020-06-13 NOTE — Telephone Encounter (Signed)
Please review for refill on flecainide. I do not see Flecainide on the patient's medication list from Marrianne Mood, [place] last office note.

## 2020-06-13 NOTE — Telephone Encounter (Signed)
Rx for flecainide was transferred to Dr. Rockey Situ after the patients Dec 2020 appt with JV. Please confirm that the patient is taking the medication. If so ok to refill up to the pt next appt.

## 2020-06-19 DIAGNOSIS — R2681 Unsteadiness on feet: Secondary | ICD-10-CM | POA: Diagnosis not present

## 2020-06-19 DIAGNOSIS — G20 Parkinson's disease: Secondary | ICD-10-CM | POA: Diagnosis not present

## 2020-06-19 DIAGNOSIS — R1312 Dysphagia, oropharyngeal phase: Secondary | ICD-10-CM | POA: Diagnosis not present

## 2020-06-19 DIAGNOSIS — R262 Difficulty in walking, not elsewhere classified: Secondary | ICD-10-CM | POA: Diagnosis not present

## 2020-06-20 DIAGNOSIS — R2681 Unsteadiness on feet: Secondary | ICD-10-CM | POA: Diagnosis not present

## 2020-06-20 DIAGNOSIS — R262 Difficulty in walking, not elsewhere classified: Secondary | ICD-10-CM | POA: Diagnosis not present

## 2020-06-20 DIAGNOSIS — G20 Parkinson's disease: Secondary | ICD-10-CM | POA: Diagnosis not present

## 2020-06-20 DIAGNOSIS — R1312 Dysphagia, oropharyngeal phase: Secondary | ICD-10-CM | POA: Diagnosis not present

## 2020-06-20 NOTE — Progress Notes (Signed)
Cardiology Office Note  Date:  06/22/2020   ID:  DAIWIK BUFFALO, DOB [birth year], MRN [id number]  PCP:  Venita Lick, NP   Chief Complaint  Patient presents with  . Follow-up    12 month F/U-No new cardiac concerns    HPI:  [name] is a very pleasant 86 year-old gentleman with a history of  Hyperlipidemia, hypertension,  diabetes,  with no known coronary artery disease,  3.5 cm abdominal aortic aneurysm seen on ultrasound in 2014  mild to moderate aortic valve stenosis. Stress echo in early 2016 at Timothy [name] fibrillation seen on even monitor, on anticoagulation , flecainide bilateral popliteal artery aneurysms, B/l popliteal bypass in 12/2012 and 05/2013 for 1.7 cm aneurysm popliteal Back surgery 2016 US aorta 09/2012: 3.7 cm U/s 05/2015: LE arterial U/S, aorta 2.5 cm He presents today for follow-up of his high cholesterol, PAD, parkinsons  paroxysmal atrial fibrillation  Presents today with daughter  Reports having Covid earlier in the year Weeks following this requiring placement in rehab and then independent living In follow-up today reports he is doing well Lives at [facility], independent living Family helps take care of him Followed by doctors making house calls No recent lab work available but has been requested  Weight up 10 pounds, eating more, ice cream, snacks, sweets Daughter reports he has nocturia No regular exercise program, spends most of his time in wheelchair  Denies any near-syncope, no chest pain  Mild shortness of breath on exertion but again is very sedentary  EKG personally reviewed by myself on todays visit Shows normal sinus rhythm rate 66 bpm left axis deviation  Other past medical history reviewed  emergency room February 17, 2019 for near syncope Vasovagal episode on the toilet  Prior lab work reviewed [facility] 7.7  Fall 02/16/2017 Hit head, concusion  surgery at [facility] in June 2016 with postoperative complications including ileus,  atrial fibrillation, congestive heart failure/swelling. He spent several weeks at rehabilitation.   12/14/2012 he had popliteal aneurysm surgery at [facility]. Other leg was done 05/21/2013.   PMH:   has a past medical history of (HFpEF) heart failure with preserved ejection fraction (Timothy [name]), AAA (abdominal aortic aneurysm) (Timothy [name]), Actinic keratosis, Back pain, Basal cell carcinoma (08/22/2019), Basal cell carcinoma (04/30/2020), Esophageal reflux, Essential hypertension, History of stress test, Mixed hyperlipidemia, Moderate aortic stenosis, Osteitis deformans without mention of bone tumor, PAD (peripheral artery disease) (Timothy [name]), Parkinson's disease (Timothy [name]), Persistent atrial fibrillation (Timothy [name]), Senile osteoporosis, Skin cancer, Squamous cell carcinoma of skin (04/02/2017), Swelling of right lower extremity, TIA (transient ischemic attack), and Type II diabetes mellitus (Timothy [name]).  PSH:    Past Surgical History:  Procedure Laterality Date  . APPENDECTOMY    . BACK SURGERY    . CATARACT EXTRACTION    . COLONOSCOPY  [id number]  . ESOPHAGEAL DILATION    . GALLBLADDER SURGERY    . HERNIA REPAIR    . RETINAL DETACHMENT SURGERY    . TONSILLECTOMY      Current Outpatient Medications  Medication Sig Dispense Refill  . acetaminophen (TYLENOL) 325 MG tablet Take 2 tablets (650 mg total) by mouth every 6 (six) hours as needed for mild pain or headache (fever >/= 101).    . Calcium Citrate-Vitamin D (CALCIUM + D PO) Take 1 tablet by mouth daily.     . carbidopa-levodopa (SINEMET IR) 25-100 MG tablet Take 1 tablet by mouth 4 (four) times daily.     Marland Kitchen ELIQUIS 5 MG TABS tablet TAKE 1  TABLET BY MOUTH TWICE DAILY 60 tablet 5  . FARXIGA 10 MG TABS tablet TAKE 1 TABLET BY MOUTH DAILY 60 tablet 0  . flecainide (TAMBOCOR) 50 MG tablet TAKE 1 TABLET BY MOUTH TWICE DAILY 60 tablet 3  . glipiZIDE-metformin (METAGLIP) 5-500 MG tablet TAKE ONE TABLET BY MOUTH TWICE DAILY 60 tablet 0  . hydrocortisone 2.5 % cream  Apply 1-2 times daily to areas at neck, face, scalp as needed for itch. 30 g 2  . JANUVIA 100 MG tablet TAKE 1 TABLET BY MOUTH DAILY 90 tablet 0  . metoprolol succinate (TOPROL-XL) 50 MG 24 hr tablet TAKE 1 TABLET BY MOUTH DAILY 30 tablet 2  . Multiple Vitamins-Minerals (CEROVITE SENIOR PO) Take 1 tablet by mouth daily.    . pantoprazole (PROTONIX) 40 MG tablet TAKE 1 TABLET BY MOUTH TWICE DAILY 180 tablet 4  . rosuvastatin (CRESTOR) 10 MG tablet TAKE 1 TABLET BY MOUTH DAILY 30 tablet 2  . sertraline (ZOLOFT) 50 MG tablet Take 1 tablet (50 mg total) by mouth daily. 90 tablet 4  . UNABLE TO FIND Take 100 mg by mouth daily. CBD 100mg      No current facility-administered medications for this visit.     Allergies:   Patient has no known allergies.   Social History:  The patient  reports that he has never smoked. He has never used smokeless tobacco. He reports that he does not drink alcohol and does not use drugs.   Family History:   family history includes Diabetes in his father and another family member; Heart disease in his brother and mother; Stroke in his brother.    Review of Systems: Review of Systems  Constitutional: Negative.   HENT: Negative.   Respiratory: Negative.   Cardiovascular: Negative.   Gastrointestinal: Negative.   Musculoskeletal:       Unsteady gait, leg weakness  Neurological: Negative.   Psychiatric/Behavioral: Negative.   All other systems reviewed and are negative.   PHYSICAL EXAM: VS:  BP (!) 142/70 (BP Location: Right Arm, Patient Position: Sitting, Cuff Size: Normal)   Pulse 66   Ht 5\' 8"  (1.727 m)   Wt 188 lb (85.3 kg)   SpO2 98%   BMI 28.59 kg/m  , BMI Body mass index is 28.59 kg/m. No change in exam on today's visit GEN: Well nourished, well developed, in no acute distress , presents with a cane HEENT: normal  Neck: no JVD, carotid bruits, or masses Cardiac: RRR; 2-3/6 SEM right sternal border, no rubs, or gallops,no edema  Respiratory:   clear to auscultation bilaterally, normal work of breathing GI: soft, nontender, nondistended, + BS MS: no deformity or atrophy  Skin: warm and dry, no rash Neuro:  Strength and sensation are intact Psych: euthymic mood, full affect   Recent Labs: 09/22/2019: ALT 8; BUN 30; Creatinine, Ser 1.12; Hemoglobin 11.4; Magnesium 2.1; Platelets 335; Potassium 4.2; Sodium 136    Lipid Panel Lab Results  Component Value Date   CHOL 116 12/10/2017   HDL 32 (L) 12/10/2017   LDLCALC 21 12/10/2017   TRIG 317 (H) 12/10/2017      Wt Readings from Last 3 Encounters:  06/22/20 188 lb (85.3 kg)  09/18/19 171 lb 15.3 oz (78 kg)  09/14/19 173 lb (78.5 kg)      ASSESSMENT AND PLAN:  Paroxysmal atrial fibrillation (HCC) - Plan: EKG 12-Lead  normal sinus rhythm Tolerating anticoagulation Continue low-dose metoprolol 50 succinate daily  AAA (abdominal aortic aneurysm) without rupture (HCC)  Mildly dilated aneurysm in 2014 3.7 cm July 2019 No further workup at this time  Essential hypertension Blood pressure is well controlled on today's visit. No changes made to the medications.  Type 2 diabetes mellitus with other circulatory complication, without long-term current use of insulin (HCC) Typically runs high 8.0 Recommended low carbohydrate diet Weight up 10 pounds, eating more ice cream and sweets More recent lab work has been requested from primary care  Parkinson's disease ([place]) On Sinemet, need to exercise Legs are weaker Using a walker, high risk of falls  Peripheral arterial disease ([place]) Bilateral popliteal aneurysm, had bilateral bypass surgery at [place] ultrasound reviewed with him in detail Disease of anterior tibial vessels, he is essentially wheelchair-bound No further work-up at this time  Aortic valve stenosis  moderate aortic valve stenosis July 2019 Moderate to severe on echocardiogram 2020, repeat echocardiogram has been ordered Again minimally symptomatic as  he is essentially wheelchair-bound   Total encounter time more than 25 minutes  Greater than 50% was spent in counseling and coordination of care with the patient     Orders Placed This Encounter  Procedures  . EKG 12-Lead  . ECHOCARDIOGRAM COMPLETE     Signed, Esmond Plants, M.D., Ph.D. 06/22/2020  [place], [place]

## 2020-06-21 DIAGNOSIS — R2681 Unsteadiness on feet: Secondary | ICD-10-CM | POA: Diagnosis not present

## 2020-06-21 DIAGNOSIS — G20 Parkinson's disease: Secondary | ICD-10-CM | POA: Diagnosis not present

## 2020-06-21 DIAGNOSIS — R262 Difficulty in walking, not elsewhere classified: Secondary | ICD-10-CM | POA: Diagnosis not present

## 2020-06-21 DIAGNOSIS — R1312 Dysphagia, oropharyngeal phase: Secondary | ICD-10-CM | POA: Diagnosis not present

## 2020-06-22 ENCOUNTER — Other Ambulatory Visit: Payer: Self-pay

## 2020-06-22 ENCOUNTER — Ambulatory Visit (INDEPENDENT_AMBULATORY_CARE_PROVIDER_SITE_OTHER): Payer: Medicare Other | Admitting: Cardiovascular Disease

## 2020-06-22 ENCOUNTER — Encounter: Payer: Self-pay | Admitting: Cardiovascular Disease

## 2020-06-22 VITALS — BP 142/70 | HR 66 | Ht 68.0 in | Wt 188.0 lb

## 2020-06-22 DIAGNOSIS — I70201 Unspecified atherosclerosis of native arteries of extremities, right leg: Secondary | ICD-10-CM

## 2020-06-22 DIAGNOSIS — I480 Paroxysmal atrial fibrillation: Secondary | ICD-10-CM | POA: Diagnosis not present

## 2020-06-22 DIAGNOSIS — E1159 Type 2 diabetes mellitus with other circulatory complications: Secondary | ICD-10-CM | POA: Diagnosis not present

## 2020-06-22 DIAGNOSIS — I10 Essential (primary) hypertension: Secondary | ICD-10-CM | POA: Diagnosis not present

## 2020-06-22 DIAGNOSIS — G20 Parkinson's disease: Secondary | ICD-10-CM | POA: Diagnosis not present

## 2020-06-22 DIAGNOSIS — I5032 Chronic diastolic (congestive) heart failure: Secondary | ICD-10-CM

## 2020-06-22 DIAGNOSIS — R1312 Dysphagia, oropharyngeal phase: Secondary | ICD-10-CM | POA: Diagnosis not present

## 2020-06-22 DIAGNOSIS — I739 Peripheral vascular disease, unspecified: Secondary | ICD-10-CM

## 2020-06-22 DIAGNOSIS — I350 Nonrheumatic aortic (valve) stenosis: Secondary | ICD-10-CM | POA: Diagnosis not present

## 2020-06-22 DIAGNOSIS — R262 Difficulty in walking, not elsewhere classified: Secondary | ICD-10-CM | POA: Diagnosis not present

## 2020-06-22 DIAGNOSIS — E782 Mixed hyperlipidemia: Secondary | ICD-10-CM

## 2020-06-22 DIAGNOSIS — R2681 Unsteadiness on feet: Secondary | ICD-10-CM | POA: Diagnosis not present

## 2020-06-22 NOTE — Patient Instructions (Addendum)
Medication Instructions:  No changes  If you need a refill on your cardiac medications before your next appointment, please call your pharmacy.    Lab work: We have medical office fax over labs by your consent signed    If you have labs (blood work) drawn today and your tests are completely normal, you will receive your results only by: Marland Kitchen MyChart Message (if you have MyChart) OR . A paper copy in the mail If you have any lab test that is abnormal or we need to change your treatment, we will call you to review the results.   Testing/Procedures:  Echo for aortic valve stenosis Your physician has requested that you have an echocardiogram. Echocardiography is a painless test that uses sound waves to create images of your heart. It provides your doctor with information about the size and shape of your heart and how well your heart's chambers and valves are working. This procedure takes approximately one hour. There are no restrictions for this procedure.  There is a possibility that an IV may need to be started during your test to inject an image enhancing agent. This is done to obtain more optimal pictures of your heart. Therefore we ask that you do at least drink some water prior to coming in to hydrate your veins.     Follow-Up: At [facility], you and your health needs are our priority.  As part of our continuing mission to provide you with exceptional heart care, we have created designated Provider Care Teams.  These Care Teams include your primary Cardiologist (physician) and Advanced Practice Providers (APPs -  Physician Assistants and Nurse Practitioners) who all work together to provide you with the care you need, when you need it.  . You will need a follow up appointment in 12 months  . Providers on your designated Care Team:   . Murray Hodgkins, NP . Christell Faith, PA-C . Marrianne Mood, PA-C  Any Other Special Instructions Will Be Listed Below (If Applicable).  COVID-19  Vaccine Information can be found at: [email or web address].uk For questions related to vaccine distribution or appointments, please email [email or web address] [email or web address] or call [phone].

## 2020-06-24 DIAGNOSIS — R2681 Unsteadiness on feet: Secondary | ICD-10-CM | POA: Diagnosis not present

## 2020-06-24 DIAGNOSIS — R1312 Dysphagia, oropharyngeal phase: Secondary | ICD-10-CM | POA: Diagnosis not present

## 2020-06-24 DIAGNOSIS — G20 Parkinson's disease: Secondary | ICD-10-CM | POA: Diagnosis not present

## 2020-06-24 DIAGNOSIS — R262 Difficulty in walking, not elsewhere classified: Secondary | ICD-10-CM | POA: Diagnosis not present

## 2020-06-25 DIAGNOSIS — R21 Rash and other nonspecific skin eruption: Secondary | ICD-10-CM | POA: Diagnosis not present

## 2020-06-25 DIAGNOSIS — I498 Other specified cardiac arrhythmias: Secondary | ICD-10-CM | POA: Diagnosis not present

## 2020-06-25 DIAGNOSIS — I10 Essential (primary) hypertension: Secondary | ICD-10-CM | POA: Diagnosis not present

## 2020-06-25 DIAGNOSIS — R2681 Unsteadiness on feet: Secondary | ICD-10-CM | POA: Diagnosis not present

## 2020-06-25 DIAGNOSIS — Z7984 Long term (current) use of oral hypoglycemic drugs: Secondary | ICD-10-CM | POA: Diagnosis not present

## 2020-06-25 DIAGNOSIS — I2510 Atherosclerotic heart disease of native coronary artery without angina pectoris: Secondary | ICD-10-CM | POA: Diagnosis not present

## 2020-06-25 DIAGNOSIS — R1312 Dysphagia, oropharyngeal phase: Secondary | ICD-10-CM | POA: Diagnosis not present

## 2020-06-25 DIAGNOSIS — E1159 Type 2 diabetes mellitus with other circulatory complications: Secondary | ICD-10-CM | POA: Diagnosis not present

## 2020-06-25 DIAGNOSIS — G20 Parkinson's disease: Secondary | ICD-10-CM | POA: Diagnosis not present

## 2020-06-25 DIAGNOSIS — Z79899 Other long term (current) drug therapy: Secondary | ICD-10-CM | POA: Diagnosis not present

## 2020-06-25 DIAGNOSIS — R609 Edema, unspecified: Secondary | ICD-10-CM | POA: Diagnosis not present

## 2020-06-25 DIAGNOSIS — R262 Difficulty in walking, not elsewhere classified: Secondary | ICD-10-CM | POA: Diagnosis not present

## 2020-06-26 DIAGNOSIS — G20 Parkinson's disease: Secondary | ICD-10-CM | POA: Diagnosis not present

## 2020-06-26 DIAGNOSIS — R262 Difficulty in walking, not elsewhere classified: Secondary | ICD-10-CM | POA: Diagnosis not present

## 2020-06-26 DIAGNOSIS — R1312 Dysphagia, oropharyngeal phase: Secondary | ICD-10-CM | POA: Diagnosis not present

## 2020-06-26 DIAGNOSIS — R2681 Unsteadiness on feet: Secondary | ICD-10-CM | POA: Diagnosis not present

## 2020-06-27 DIAGNOSIS — R262 Difficulty in walking, not elsewhere classified: Secondary | ICD-10-CM | POA: Diagnosis not present

## 2020-06-27 DIAGNOSIS — R2681 Unsteadiness on feet: Secondary | ICD-10-CM | POA: Diagnosis not present

## 2020-06-27 DIAGNOSIS — R1312 Dysphagia, oropharyngeal phase: Secondary | ICD-10-CM | POA: Diagnosis not present

## 2020-06-27 DIAGNOSIS — G20 Parkinson's disease: Secondary | ICD-10-CM | POA: Diagnosis not present

## 2020-07-01 DIAGNOSIS — G20 Parkinson's disease: Secondary | ICD-10-CM | POA: Diagnosis not present

## 2020-07-01 DIAGNOSIS — R2681 Unsteadiness on feet: Secondary | ICD-10-CM | POA: Diagnosis not present

## 2020-07-01 DIAGNOSIS — R262 Difficulty in walking, not elsewhere classified: Secondary | ICD-10-CM | POA: Diagnosis not present

## 2020-07-01 DIAGNOSIS — R1312 Dysphagia, oropharyngeal phase: Secondary | ICD-10-CM | POA: Diagnosis not present

## 2020-07-03 ENCOUNTER — Encounter: Payer: Self-pay | Admitting: Cardiovascular Disease

## 2020-07-04 ENCOUNTER — Other Ambulatory Visit: Payer: Self-pay | Admitting: Nurse Practitioner

## 2020-07-04 DIAGNOSIS — R2681 Unsteadiness on feet: Secondary | ICD-10-CM | POA: Diagnosis not present

## 2020-07-04 DIAGNOSIS — R1312 Dysphagia, oropharyngeal phase: Secondary | ICD-10-CM | POA: Diagnosis not present

## 2020-07-04 DIAGNOSIS — R262 Difficulty in walking, not elsewhere classified: Secondary | ICD-10-CM | POA: Diagnosis not present

## 2020-07-04 DIAGNOSIS — E785 Hyperlipidemia, unspecified: Secondary | ICD-10-CM

## 2020-07-04 DIAGNOSIS — E1159 Type 2 diabetes mellitus with other circulatory complications: Secondary | ICD-10-CM

## 2020-07-04 DIAGNOSIS — G20 Parkinson's disease: Secondary | ICD-10-CM | POA: Diagnosis not present

## 2020-07-06 DIAGNOSIS — R262 Difficulty in walking, not elsewhere classified: Secondary | ICD-10-CM | POA: Diagnosis not present

## 2020-07-06 DIAGNOSIS — R2681 Unsteadiness on feet: Secondary | ICD-10-CM | POA: Diagnosis not present

## 2020-07-06 DIAGNOSIS — R1312 Dysphagia, oropharyngeal phase: Secondary | ICD-10-CM | POA: Diagnosis not present

## 2020-07-06 DIAGNOSIS — G20 Parkinson's disease: Secondary | ICD-10-CM | POA: Diagnosis not present

## 2020-07-26 ENCOUNTER — Other Ambulatory Visit: Payer: Medicare Other

## 2020-08-07 ENCOUNTER — Ambulatory Visit (INDEPENDENT_AMBULATORY_CARE_PROVIDER_SITE_OTHER): Payer: Medicare Other

## 2020-08-07 ENCOUNTER — Other Ambulatory Visit: Payer: Self-pay

## 2020-08-07 DIAGNOSIS — I350 Nonrheumatic aortic (valve) stenosis: Secondary | ICD-10-CM

## 2020-08-07 LAB — ECHOCARDIOGRAM COMPLETE
AR max vel: 0.86 cm2
AV Area VTI: 0.87 cm2
AV Area mean vel: 0.9 cm2
AV Mean grad: 40 mmHg
AV Peak grad: 68.2 mmHg
Ao pk vel: 4.13 m/s
Area-P 1/2: 2.76 cm2
P 1/2 time: 530 msec
S' Lateral: 2.6 cm

## 2020-08-09 ENCOUNTER — Other Ambulatory Visit: Payer: Self-pay | Admitting: Nurse Practitioner

## 2020-08-09 DIAGNOSIS — E1159 Type 2 diabetes mellitus with other circulatory complications: Secondary | ICD-10-CM

## 2020-08-10 NOTE — Telephone Encounter (Signed)
Erroneous encounter

## 2020-08-13 ENCOUNTER — Telehealth: Payer: Self-pay

## 2020-08-13 NOTE — Telephone Encounter (Signed)
Spoke with pt's daughter (okay by DPR), Timothy [name] of father's recent echo, Dr. Rockey Situ had a chance to review his results and advised   "Aortic valve stenosis now severe  Can we talk with patients family with results (and patient)  Perhaps we could set up virtual visit to discuss?  Question is whether to pursue aggressive coarse (TAVR workup)"  Timothy [name] would like an in person visit, this was schedule for Friday 2/11 at 15:20. Was able to educated Timothy [name] on TAVR workout and advise much would be discuss at visit for plan of care concerning pt's echo. Timothy [name] verbalized understanding.  otherwise all questions or concerns were address and no additional concerns at this time. Agreeable to plan, will call back for anything further.

## 2020-08-14 NOTE — Telephone Encounter (Signed)
Return pt's call, spoke with daughter [name], stated she missed a call from our office. Advised daughter it was the automatic reminder call of pt's appt for Friday 2/11 at 15:20. [place] confirmed appt, will arrive 15 mins early for check-in.

## 2020-08-14 NOTE — Telephone Encounter (Signed)
Patient returning call.

## 2020-08-16 NOTE — Progress Notes (Unsigned)
Cardiology Office Note  Date:  08/17/2020   ID:  Timothy [name], DOB [birth year], MRN [id number]  PCP:  Venita Lick, NP   Chief Complaint  Patient presents with  . Follow-up    Discuss Echo results    HPI:  Timothy [name] is a  87 year-old gentleman with a history of  Hyperlipidemia, hypertension,  diabetes,  Severe aortic valve stenosis. Atrial fibrillation seen on event monitor, on anticoagulation , bilateral popliteal artery aneurysms, B/l popliteal bypass in 12/2012 and 05/2013 for 1.7 cm aneurysm popliteal Back surgery 2016 He presents today for follow-up of his high cholesterol, PAD, Parkinsons, severe aortic valve stenosis  paroxysmal atrial fibrillation  Presents today with daughter Second daughter was reached on the phone Last seen 06/2020  Echo: Discussed in detail Ef normal severe aortic valve  stenosis. Aortic valve area, by  VTI measures 0.87 cm. Aortic valve mean gradient measures 40.0 mmHg.  Aortic valve Vmax measures 4.13 m/s.   Reports having Covid in 2021 Required rehab, Lives at [facility], independent living Family very involved in his care Followed by doctors making house calls  He reports weight stable, active presenting today in a wheelchair Soft spoken,  Spends most of his time in the wheelchair, no regular exercise program  No near syncope syncope, no chest pain Mild shortness of breath on exertion  EKG personally reviewed by myself on todays visit Shows normal sinus rhythm rate 66 bpm left axis deviation  Other past medical history reviewed  emergency room February 17, 2019 for near syncope Vasovagal episode on the toilet  Prior lab work reviewed [facility] 7.7  Fall 02/16/2017 Hit head, concusion  surgery at [facility] in June 2016 with postoperative complications including ileus, atrial fibrillation, congestive heart failure/swelling. spent several weeks at rehabilitation.   12/14/2012 he had popliteal aneurysm surgery at [facility]. Other leg was done 05/21/2013.   PMH:   has a past medical history of (HFpEF) heart failure with preserved ejection fraction ([place]), AAA (abdominal aortic aneurysm) ([place]), Actinic keratosis, Back pain, Basal cell carcinoma (08/22/2019), Basal cell carcinoma (04/30/2020), Esophageal reflux, Essential hypertension, History of stress test, Mixed hyperlipidemia, Moderate aortic stenosis, Osteitis deformans without mention of bone tumor, PAD (peripheral artery disease) ([place]), Parkinson's disease ([place]), Persistent atrial fibrillation ([place]), Senile osteoporosis, Skin cancer, Squamous cell carcinoma of skin (04/02/2017), Swelling of right lower extremity, TIA (transient ischemic attack), and Type II diabetes mellitus ([place]).  PSH:    Past Surgical History:  Procedure Laterality Date  . APPENDECTOMY    . BACK SURGERY    . CATARACT EXTRACTION    . COLONOSCOPY  [id number]  . ESOPHAGEAL DILATION    . GALLBLADDER SURGERY    . HERNIA REPAIR    . RETINAL DETACHMENT SURGERY    . TONSILLECTOMY      Current Outpatient Medications  Medication Sig Dispense Refill  . acetaminophen (TYLENOL) 325 MG tablet Take 2 tablets (650 mg total) by mouth every 6 (six) hours as needed for mild pain or headache (fever >/= 101).    . Calcium Citrate-Vitamin D (CALCIUM + D PO) Take 1 tablet by mouth daily.     . carbidopa-levodopa (SINEMET IR) 25-100 MG tablet Take 1 tablet by mouth 4 (four) times daily.     . cyanocobalamin (,VITAMIN B-12,) 1000 MCG/ML injection INJECT 1ML ONCE A WEEK FOR 14 DAYS (2 WEEKS)  and  THEN RETURN TO MONTHLY FREQUENCY    . ELIQUIS 5 MG TABS tablet TAKE 1 TABLET BY  MOUTH TWICE DAILY 60 tablet 5  . famotidine (PEPCID) 20 MG tablet Take 20 mg by mouth at bedtime.    Marland Kitchen FARXIGA 10 MG TABS tablet TAKE 1 TABLET BY MOUTH DAILY 60 tablet 0  . flecainide (TAMBOCOR) 50 MG tablet TAKE 1 TABLET BY MOUTH TWICE DAILY 60 tablet 3  . glipiZIDE-metformin (METAGLIP) 5-500 MG tablet TAKE 1 TABLET BY MOUTH  TWICE DAILY 60 tablet 0  . GLUCOSAMINE-CHONDROITIN PO Take by mouth daily.    . hydrocortisone 2.5 % cream Apply 1-2 times daily to areas at neck, face, scalp as needed for itch. 30 g 2  . JANUVIA 100 MG tablet TAKE 1 TABLET BY MOUTH DAILY 90 tablet 0  . metoprolol succinate (TOPROL-XL) 50 MG 24 hr tablet TAKE 1 TABLET BY MOUTH DAILY 30 tablet 2  . Multiple Vitamins-Minerals (CEROVITE SENIOR PO) Take 1 tablet by mouth daily.    . pantoprazole (PROTONIX) 40 MG tablet TAKE 1 TABLET BY MOUTH TWICE DAILY 180 tablet 4  . rosuvastatin (CRESTOR) 10 MG tablet TAKE 1 TABLET BY MOUTH DAILY 30 tablet 2  . sertraline (ZOLOFT) 50 MG tablet Take 1 tablet (50 mg total) by mouth daily. 90 tablet 4  . UNABLE TO FIND Take 100 mg by mouth daily. CBD 100mg     . UNABLE TO FIND Med Name: [place]     No current facility-administered medications for this visit.     Allergies:   Patient has no known allergies.   Social History:  The patient  reports that he has never smoked. He has never used smokeless tobacco. He reports that he does not drink alcohol and does not use drugs.   Family History:   family history includes Diabetes in his father and another family member; Heart disease in his brother and mother; Stroke in his brother.    Review of Systems: Review of Systems  Constitutional: Negative.   HENT: Negative.   Respiratory: Negative.   Cardiovascular: Negative.   Gastrointestinal: Negative.   Musculoskeletal:       Unsteady gait, leg weakness  Neurological: Negative.   Psychiatric/Behavioral: Negative.   All other systems reviewed and are negative.   PHYSICAL EXAM: VS:  BP 124/72   Pulse 66   Ht 5\' 8"  (1.727 m)   Wt 191 lb (86.6 kg)   BMI 29.04 kg/m  , BMI Body mass index is 29.04 kg/m. Constitutional:  oriented to person, place, and time. No distress.  Presenting in a wheelchair, soft spoken HENT:  Head: Grossly normal Eyes:  no discharge. No scleral icterus.  Neck: No  JVD, no carotid bruits  Cardiovascular: Regular rate and rhythm, 3/6 systolic ejection murmur appreciated right sternal border Pulmonary/Chest: Clear to auscultation bilaterally, no wheezes or rails Abdominal: Soft.  no distension.  no tenderness.  Musculoskeletal: Normal range of motion Neurological:  normal muscle tone. Coordination normal. No atrophy Skin: Skin warm and dry Psychiatric: normal affect, pleasant  Recent Labs: 09/22/2019: ALT 8; BUN 30; Creatinine, Ser 1.12; Hemoglobin 11.4; Magnesium 2.1; Platelets 335; Potassium 4.2; Sodium 136    Lipid Panel Lab Results  Component Value Date   CHOL 116 12/10/2017   HDL 32 (L) 12/10/2017   LDLCALC 21 12/10/2017   TRIG 317 (H) 12/10/2017      Wt Readings from Last 3 Encounters:  08/17/20 191 lb (86.6 kg)  06/22/20 188 lb (85.3 kg)  09/18/19 171 lb 15.3 oz (78 kg)     ASSESSMENT AND PLAN:  Severe aortic  valve stenosis Gradient within severe range, Long discussion with family today, referral to TAVR clinic for consideration of options He is interested in pursuing TAVR procedure Family noncommittal We did discuss that he would need procedures such as catheterization, CT scan etc., this could be done in [place] if they would prefer  Paroxysmal atrial fibrillation ([place]) - Plan: EKG 12-Lead Maintaining normal sinus rhythm, on anticoagulation, beta-blocker No recent falls  AAA (abdominal aortic aneurysm) without rupture (HCC) 3.7 cm July 2019 We will need periodic evaluation  Essential hypertension Blood pressure is well controlled on today's visit. No changes made to the medications.  Type 2 diabetes mellitus with other circulatory complication, without long-term current use of insulin (HCC) Typically runs high 8.5 most recently "Doctors making house calls" managing Weight has been stable  Parkinson's disease ([place]) Soft spoken, no recent falls On Sinemet, recommended need to walk Legs weak in general Using a  walker  Peripheral arterial disease ([place]) Bilateral popliteal aneurysm,  bilateral bypass surgery at [facility] Disease of anterior tibial vessels,  Denies claudication symptoms  Aortic valve stenosis  Severe aortic valve stenosis on recent echocardiogram, discussed gradients, estimated valve area Unclear if he is symptomatic apart from mild shortness of breath on exertion, Transports with wheelchair   Total encounter time more than 45 minutes  Greater than 50% was spent in counseling and coordination of care with the patient     No orders of the defined types were placed in this encounter.    Signed, Esmond Plants, M.D., Ph.D. 08/17/2020  [place], [place]

## 2020-08-17 ENCOUNTER — Ambulatory Visit
Admission: RE | Admit: 2020-08-17 | Discharge: 2020-08-17 | Disposition: A | Discharge status: Home / Self Care | Payer: Medicare Other | Source: Ambulatory Visit | Attending: Cardiovascular Disease | Admitting: Cardiovascular Disease

## 2020-08-17 ENCOUNTER — Other Ambulatory Visit: Payer: Self-pay

## 2020-08-17 ENCOUNTER — Encounter: Payer: Self-pay | Admitting: Cardiovascular Disease

## 2020-08-17 ENCOUNTER — Ambulatory Visit (INDEPENDENT_AMBULATORY_CARE_PROVIDER_SITE_OTHER): Payer: Medicare Other | Admitting: Cardiovascular Disease

## 2020-08-17 VITALS — BP 124/72 | HR 66 | Ht 68.0 in | Wt 191.0 lb

## 2020-08-17 DIAGNOSIS — I5032 Chronic diastolic (congestive) heart failure: Secondary | ICD-10-CM | POA: Diagnosis not present

## 2020-08-17 DIAGNOSIS — R0989 Other specified symptoms and signs involving the circulatory and respiratory systems: Secondary | ICD-10-CM

## 2020-08-17 DIAGNOSIS — E1159 Type 2 diabetes mellitus with other circulatory complications: Secondary | ICD-10-CM | POA: Diagnosis not present

## 2020-08-17 DIAGNOSIS — I350 Nonrheumatic aortic (valve) stenosis: Secondary | ICD-10-CM

## 2020-08-17 DIAGNOSIS — I480 Paroxysmal atrial fibrillation: Secondary | ICD-10-CM | POA: Diagnosis not present

## 2020-08-17 DIAGNOSIS — G20 Parkinson's disease: Secondary | ICD-10-CM | POA: Diagnosis not present

## 2020-08-17 DIAGNOSIS — E782 Mixed hyperlipidemia: Secondary | ICD-10-CM

## 2020-08-17 DIAGNOSIS — I10 Essential (primary) hypertension: Secondary | ICD-10-CM

## 2020-08-17 DIAGNOSIS — I739 Peripheral vascular disease, unspecified: Secondary | ICD-10-CM | POA: Diagnosis not present

## 2020-08-17 NOTE — Patient Instructions (Addendum)
Referral to TAVR clinic for severe aortic valve stenosis   Medication Instructions:  No changes  If you need a refill on your cardiac medications before your next appointment, please call your pharmacy.    Lab work: No new labs needed   If you have labs (blood work) drawn today and your tests are completely normal, you will receive your results only by: Marland Kitchen MyChart Message (if you have MyChart) OR . A paper copy in the mail If you have any lab test that is abnormal or we need to change your treatment, we will call you to review the results.   Testing/Procedures: 1)  CXR PA and lateral, for rales at the lung bases, covid 09/2019. A chest x-ray takes a picture of the organs and structures inside the chest, including the heart, lungs, and blood vessels. This test can show several things, including, whether the heart is enlarges; whether fluid is building up in the lungs; and whether pacemaker / defibrillator leads are still in place.    Follow-Up: At [facility], you and your health needs are our priority.  As part of our continuing mission to provide you with exceptional heart care, we have created designated Provider Care Teams.  These Care Teams include your primary Cardiologist (physician) and Advanced Practice Providers (APPs -  Physician Assistants and Nurse Practitioners) who all work together to provide you with the care you need, when you need it.  . You will need a follow up appointment in 6 months  . Providers on your designated Care Team:   . Murray Hodgkins, NP . Christell Faith, PA-C . Marrianne Mood, PA-C  Any Other Special Instructions Will Be Listed Below (If Applicable).  COVID-19 Vaccine Information can be found at: ShippingScam.co.uk For questions related to vaccine distribution or appointments, please email vaccine@[email or web address] .com or call [phone].

## 2020-08-21 ENCOUNTER — Ambulatory Visit (INDEPENDENT_AMBULATORY_CARE_PROVIDER_SITE_OTHER): Payer: Medicare Other | Admitting: Dermatology

## 2020-08-21 ENCOUNTER — Other Ambulatory Visit: Payer: Self-pay

## 2020-08-21 DIAGNOSIS — L578 Other skin changes due to chronic exposure to nonionizing radiation: Secondary | ICD-10-CM | POA: Diagnosis not present

## 2020-08-21 DIAGNOSIS — L570 Actinic keratosis: Secondary | ICD-10-CM | POA: Diagnosis not present

## 2020-08-21 DIAGNOSIS — L219 Seborrheic dermatitis, unspecified: Secondary | ICD-10-CM

## 2020-08-21 DIAGNOSIS — Z85828 Personal history of other malignant neoplasm of skin: Secondary | ICD-10-CM

## 2020-08-21 DIAGNOSIS — L821 Other seborrheic keratosis: Secondary | ICD-10-CM

## 2020-08-21 NOTE — Patient Instructions (Addendum)
Recommend Hydrocortisone 2.5% cream to ears and around nose as needed for flakiness. Patient has refills.   Cryotherapy Aftercare  . Wash gently with soap and water everyday.   Marland Kitchen Apply Vaseline and Band-Aid daily until healed.

## 2020-08-21 NOTE — Progress Notes (Signed)
   Follow-Up Visit   Subjective  Timothy [name] is a 87 y.o. male who presents for the following: Follow-up (Patient here today for 3 month BCC and AK follow up. BCC at left cheek excised 05/21/20. Patient advises there are a few areas at scalp. ).  Patient accompanied by daughter.   The following portions of the chart were reviewed this encounter and updated as appropriate:       Review of Systems:  No other skin or systemic complaints except as noted in HPI or Assessment and Plan.  Objective  Well appearing patient in no apparent distress; mood and affect are within normal limits.  A focused examination was performed including face, scalp, arms. Relevant physical exam findings are noted in the Assessment and Plan.  Objective  Left Ear, nose: Pink patches with greasy scale.   Objective  crown x 9, left ear helix below scar x 1 (10): Erythematous thin papules/macules with gritty scale.    Assessment & Plan  Seborrheic dermatitis Left Ear, nose  Recommend HC 2.5% cream to ears qd/bid and around nose as needed for flaky rash. Patient has.   Seborrheic Dermatitis  -  is a chronic persistent rash characterized by pinkness and scaling most commonly of the mid face but also can occur on the scalp (dandruff), ears; mid chest and mid back. It tends to be exacerbated by stress and cooler weather.  People who have neurologic disease may experience new onset or exacerbation of existing seborrheic dermatitis.  The condition is not curable but treatable and can be controlled.   Other Related Medications hydrocortisone 2.5 % cream  AK (actinic keratosis) (10) crown x 9, left ear helix below scar x 1  Destruction of lesion - crown x 9, left ear helix below scar x 1  Destruction method: cryotherapy   Informed consent: discussed and consent obtained   Lesion destroyed using liquid nitrogen: Yes   Region frozen until ice ball extended beyond lesion: Yes   Outcome: patient tolerated  procedure well with no complications   Post-procedure details: wound care instructions given     History of Basal Cell Carcinoma of the Skin - No evidence of recurrence today at left cheek - Recommend regular full body skin exams - Recommend daily broad spectrum sunscreen SPF 30+ to sun-exposed areas, reapply every 2 hours as needed.  - Call if any new or changing lesions are noted between office visits  Seborrheic Keratoses - Stuck-on, waxy, tan-brown papules and plaques  - Discussed benign etiology and prognosis. - Observe - Call for any changes  Actinic Damage - chronic, secondary to cumulative UV radiation exposure/sun exposure over time - diffuse scaly erythematous macules with underlying dyspigmentation - Recommend daily broad spectrum sunscreen SPF 30+ to sun-exposed areas, reapply every 2 hours as needed.  - Call for new or changing lesions.  Return in about 6 months (around 02/18/2021) for AK follow up.  Graciella Belton, RMA, am acting as scribe for Brendolyn Patty, MD . Documentation: I have reviewed the above documentation for accuracy and completeness, and I agree with the above.  Brendolyn Patty MD

## 2020-08-24 ENCOUNTER — Telehealth: Payer: Self-pay

## 2020-08-24 NOTE — Telephone Encounter (Signed)
Able to reach Timothy [name] daughter [name] (DPR approved) regarding his recent CXR, Dr. Rockey Situ had a chance to review his results and advised  "Residual scarring in the lungs but otherwise looks improved"  Results were also seen on MyChart, no concerns or questions at this time, [place] appreciate the phone call, nothing further at this time,.

## 2020-09-05 ENCOUNTER — Other Ambulatory Visit: Payer: Self-pay | Admitting: Nurse Practitioner

## 2020-09-05 DIAGNOSIS — E1159 Type 2 diabetes mellitus with other circulatory complications: Secondary | ICD-10-CM

## 2020-09-05 NOTE — Telephone Encounter (Signed)
Notes to clinic: looks like patient has a new provider  Review for refills   Requested Prescriptions  Pending Prescriptions Disp Refills   glipiZIDE-metformin (METAGLIP) 5-500 MG tablet [Pharmacy Med Name: GLIPIZIDE-METFORMIN HCL 5-500 MG TA] 60 tablet 0    Sig: TAKE 1 TABLET BY MOUTH TWICE DAILY      Endocrinology:  Diabetes - Biguanide + Sulfonylurea Combos Failed - 09/05/2020 11:36 AM      Failed - HBA1C is between 0 and 7.9 and within 180 days    Hemoglobin A1C  Date Value Ref Range Status  02/21/2016 7.6  Final   HB A1C (BAYER DCA - WAIVED)  Date Value Ref Range Status  12/06/2018 7.7 (H) <7.0 % Final    Comment:                                          Diabetic Adult            <7.0                                       Healthy Adult        4.3 - 5.7                                                           (DCCT/NGSP) American Diabetes Association's Summary of Glycemic Recommendations for Adults with Diabetes: Hemoglobin A1c <7.0%. More stringent glycemic goals (A1c <6.0%) may further reduce complications at the cost of increased risk of hypoglycemia.    Hgb A1c MFr Bld  Date Value Ref Range Status  09/18/2019 7.6 (H) 4.8 - 5.6 % Final    Comment:    (NOTE) Pre diabetes:          5.7%-6.4% Diabetes:              >6.4% Glycemic control for   <7.0% adults with diabetes           Failed - eGFR in normal range and within 360 days    EGFR (African American)  Date Value Ref Range Status  12/24/2011 60 (L)  Final   GFR calc Af Amer  Date Value Ref Range Status  09/22/2019 >60 >60 mL/min Final   EGFR (Non-African Amer.)  Date Value Ref Range Status  12/24/2011 52 (L)  Final    Comment:    eGFR values <63m/min/1.73 m2 may be an indication of chronic kidney disease (CKD). Calculated eGFR is useful in patients with stable renal function. The eGFR calculation will not be reliable in acutely ill patients when serum creatinine is changing rapidly. It is not  useful in  patients on dialysis. The eGFR calculation may not be applicable to patients at the low and high extremes of body sizes, pregnant women, and vegetarians.    GFR calc non Af Amer  Date Value Ref Range Status  09/22/2019 58 (L) >60 mL/min Final          Failed - Valid encounter within last 6 months    Recent Outpatient Visits           1 year ago  Type 2 diabetes mellitus with other circulatory complication, without long-term current use of insulin ([place])   [place] Crissman, Jeannette How, MD   2 years ago Essential hypertension   [place], Jeannette How, MD   2 years ago Essential hypertension   [place], Jeannette How, MD   2 years ago Hospital discharge follow-up   [facility] Trinna Post, PA-C   2 years ago Essential hypertension   [place], Jeannette How, MD       Future Appointments             In 5 months Brendolyn Patty, MD [place]   In 5 months Gollan, Kathlene November, MD [facility] [place] in normal range and within 360 days    Creatinine  Date Value Ref Range Status  12/24/2011 1.30 0.60 - 1.30 mg/dL Final   Creatinine, Ser  Date Value Ref Range Status  09/22/2019 1.12 0.61 - 1.24 mg/dL Final            ELIQUIS 5 MG TABS tablet [Pharmacy Med Name: ELIQUIS 5 MG TAB] 60 tablet 5    Sig: TAKE 1 TABLET BY MOUTH TWICE DAILY      Hematology:  Anticoagulants Failed - 09/05/2020 11:36 AM      Failed - HGB in normal range and within 360 days    Hemoglobin  Date Value Ref Range Status  09/22/2019 11.4 (L) 13.0 - 17.0 g/dL Final  12/06/2018 12.2 (L) 13.0 - 17.7 g/dL Final          Failed - HCT in normal range and within 360 days    HCT  Date Value Ref Range Status  09/22/2019 35.7 (L) 39.0 - 52.0 % Final   Hematocrit  Date Value Ref Range Status  12/06/2018 38.2 37.5 - 51.0 % Final          Failed - Valid  encounter within last 12 months    Recent Outpatient Visits           1 year ago Type 2 diabetes mellitus with other circulatory complication, without long-term current use of insulin ([place])   [place] Crissman, Jeannette How, MD   2 years ago Essential hypertension   [place], Jeannette How, MD   2 years ago Essential hypertension   [place], Mark A, MD   2 years ago Hospital discharge follow-up   [facility] Trinna Post, PA-C   2 years ago Essential hypertension   [place], Jeannette How, MD       Future Appointments             In 5 months Brendolyn Patty, MD [place]   In 5 months Gollan, Kathlene November, MD [place]             Passed - PLT in normal range and within 360 days    Platelets  Date Value Ref Range Status  09/22/2019 335 150 - 400 K/uL Final  12/06/2018 201 150 - 450 x10E3/uL Final          Passed - Cr in normal range and within 360 days    Creatinine  Date Value Ref Range Status  12/24/2011 1.30 0.60 - 1.30 mg/dL Final   Creatinine, Ser  Date Value Ref Range Status  09/22/2019 1.12 0.61 - 1.24 mg/dL Final

## 2020-09-05 NOTE — Telephone Encounter (Signed)
Please advise patient was "no show" the last in office appointments he was supposed to have. No upcoming appointment.

## 2020-09-05 NOTE — Telephone Encounter (Signed)
He will need appointment.

## 2020-09-06 NOTE — Telephone Encounter (Signed)
Pt's Daughter stated [facility] would [facility] as [facility] are working with Doctors making house calls

## 2020-09-10 ENCOUNTER — Encounter: Payer: Self-pay | Admitting: Cardiovascular Disease

## 2020-09-10 ENCOUNTER — Other Ambulatory Visit: Payer: Self-pay

## 2020-09-10 ENCOUNTER — Ambulatory Visit (INDEPENDENT_AMBULATORY_CARE_PROVIDER_SITE_OTHER): Payer: Medicare Other | Admitting: Cardiovascular Disease

## 2020-09-10 VITALS — BP 124/60 | HR 71 | Ht 68.0 in | Wt 193.0 lb

## 2020-09-10 DIAGNOSIS — I350 Nonrheumatic aortic (valve) stenosis: Secondary | ICD-10-CM

## 2020-09-10 NOTE — Progress Notes (Signed)
[facility] Consult Note  Chief Complaint  Timothy [name] presents with  . New Timothy [name] (Initial Visit)    Severe aortic stenosis   History of Present Illness: 87 yo male with history of Parkinson's disease, chronic diastolic CHF, HTN, hyperlipidemia, AAA, GERD, diabetes mellitus, Paget's disease, PAD s/p bilateral LE bypass, prior TIA, persistent atrial fibrillation and severe aortic stenosis who is here today as a new consult, referred by Dr. Rockey Situ, for further discussion regarding his aortic stenosis and possible TAVR. He has persistent atrial fibrillation and is on Eliquis. He has PAD and has had prior bilateral lower extremity bypass secondary to popliteal artery aneurysms at [facility]. He has advanced Parkinson's disease and is confined to a wheelchair. He rarely ambulates with a walker. He has been followed for moderate aortic stenosis. Echo 08/07/20 with LVEF=65-70%. The aortic valve leaflets are thickened and calcified with limited leaflet excursion. The mean gradient is 40 mmHg, peak gradient 68 mmHg, AVA 0.86 cm2, dimensionless index 0.18. This is consistent with severe aortic stenosis.   He tells me today that he lives in an independent living apartment. He spends most of the day in his recliner and feels well overall. He rarely ambulates with a walker. He has rare mild shortness of breath with exertion that occurs once per month. No chest pain, dizziness, near syncope or syncope. No lower extremity edema. He is mostly confined to his wheelchair. He is retired from a Chartered certified accountant. He is here today with his daughter.   Primary Care Physician: [facility] Primary Cardiologist: Rockey Situ Referring Cardiologist: Rockey Situ  Past Medical History:  Diagnosis Date  . (HFpEF) heart failure with preserved ejection fraction ([place])    a. 05/2019 Echo: EF 65-70%, mod LVH. Gr2 DD. Mod AS.  Marland Kitchen AAA (abdominal aortic aneurysm) ([place])    a. 2014 Abd U/S: 3.5cm AAA; b. 10/20189 Abd U/S: 3.3cm  AAA; c. 01/2018 CT Abd/Pelvis: 2.7cm infrarenal AAA - rec f/u U/S in 5 yrs.  . Actinic keratosis   . Back pain   . Basal cell carcinoma 08/22/2019   Left mid ear helix. Nodular pattern  . Basal cell carcinoma 04/30/2020   Left cheek. Superficial and nodular patterns.  . Esophageal reflux   . Essential hypertension   . History of stress test    a. 01/2012 MV: EF 63%, no rwma. No ischemia/infarct. Low risk.  . Mixed hyperlipidemia   . Moderate aortic stenosis    a. 05/2019 Echo: Mod AS. Mean grad 29.5 mmHg. Peak grad 55.7 mmHg. AVA 1.28 cm^2 (VTI). No significant change from 2019 echo.  . 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 PAD (peripheral artery disease) ([place])    a. 12/2012 and 05/2013 s/p bilat LE bypass @ [facility] in setting of bilat popliteal aneurysms; b. 05/2019 LE Duplex: R PT 50-74%, Nl L Leg.  . Parkinson's disease ([place])   . Persistent atrial fibrillation ([place])    a. 02/2015 Event monitor: long periods of Afib--prev on flecainide; b. CHA2DS2VASc = 8-->Eliquis 5 bid.  . Senile osteoporosis   . Skin cancer   . Squamous cell carcinoma of skin 04/02/2017   Left cheek. SCCis. Mohs at [facility] 2018  . Swelling of right lower extremity    a. 05/2019 LE U/S: No DVT. 50-64% R PT artery stenosis.  Marland Kitchen TIA (transient ischemic attack)   . Type II diabetes mellitus ([place])     Past Surgical History:  Procedure Laterality Date  . APPENDECTOMY    .  BACK SURGERY    . CATARACT EXTRACTION    . COLONOSCOPY  [id number]  . ESOPHAGEAL DILATION    . GALLBLADDER SURGERY    . HERNIA REPAIR    . Lower extremity arterial bypass Bilateral   . RETINAL DETACHMENT SURGERY    . TONSILLECTOMY      Current Outpatient Medications  Medication Sig Dispense Refill  . acetaminophen (TYLENOL) 325 MG tablet Take 2 tablets (650 mg total) by mouth every 6 (six) hours as needed for mild pain or headache (fever >/= 101).    . Calcium Citrate-Vitamin D (CALCIUM + D PO) Take 1 tablet by  mouth daily.     . carbidopa-levodopa (SINEMET IR) 25-100 MG tablet Take 1 tablet by mouth 4 (four) times daily.     . cyanocobalamin (,VITAMIN B-12,) 1000 MCG/ML injection INJECT 1ML ONCE A WEEK FOR 14 DAYS (2 WEEKS)  and  THEN RETURN TO MONTHLY FREQUENCY    . ELIQUIS 5 MG TABS tablet TAKE 1 TABLET BY MOUTH TWICE DAILY 60 tablet 5  . famotidine (PEPCID) 20 MG tablet Take 20 mg by mouth at bedtime.    Marland Kitchen FARXIGA 10 MG TABS tablet TAKE 1 TABLET BY MOUTH DAILY 60 tablet 0  . flecainide (TAMBOCOR) 50 MG tablet TAKE 1 TABLET BY MOUTH TWICE DAILY 60 tablet 3  . glipiZIDE-metformin (METAGLIP) 5-500 MG tablet TAKE 1 TABLET BY MOUTH TWICE DAILY 60 tablet 0  . GLUCOSAMINE-CHONDROITIN PO Take by mouth daily.    . hydrocortisone 2.5 % cream Apply 1-2 times daily to areas at neck, face, scalp as needed for itch. 30 g 2  . JANUVIA 100 MG tablet TAKE 1 TABLET BY MOUTH DAILY 90 tablet 0  . metoprolol succinate (TOPROL-XL) 50 MG 24 hr tablet TAKE 1 TABLET BY MOUTH DAILY 30 tablet 2  . Multiple Vitamins-Minerals (CEROVITE SENIOR PO) Take 1 tablet by mouth daily.    . pantoprazole (PROTONIX) 40 MG tablet TAKE 1 TABLET BY MOUTH TWICE DAILY 180 tablet 4  . rosuvastatin (CRESTOR) 10 MG tablet TAKE 1 TABLET BY MOUTH DAILY 30 tablet 2  . sertraline (ZOLOFT) 50 MG tablet Take 1 tablet (50 mg total) by mouth daily. 90 tablet 4  . UNABLE TO FIND Take 100 mg by mouth daily. CBD 100mg     . UNABLE TO FIND Med Name: [place]     No current facility-administered medications for this visit.    No Known Allergies  Social History   Socioeconomic History  . Marital status: Married    Spouse name: Not on file  . Number of children: 5  . Years of education: Not on file  . Highest education level: Not on file  Occupational History  . 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 Smoking status: Never Smoker  . Smokeless tobacco: Never Used  Vaping Use  . Vaping Use: Never used  Substance and Sexual  Activity  . Alcohol use: No  . Drug use: No  . Sexual activity: Never  Other Topics Concern  . Not on file  Social History Narrative  . Not on file   Social Determinants of Health   Financial Resource Strain: Not on file  Food Insecurity: Not on file  Transportation Needs: Not on file  Physical Activity: Not on file  Stress: Not on file  Social Connections: Not on file  Intimate Partner Violence: Not on file    Family History  Problem Relation Age of Onset  . Heart disease Mother   .  Diabetes Father   . Diabetes Other        grandparents  . Heart disease Brother   . Stroke Brother     Review of Systems:  As stated in the HPI and otherwise negative.   BP 124/60   Pulse 71   Ht 5\' 8"  (1.727 m)   Wt 193 lb (87.5 kg)   SpO2 96%   BMI 29.35 kg/m   Physical Examination: General: Well developed, well nourished, NAD  HEENT: OP clear, mucus membranes moist  SKIN: warm, dry. No rashes. Neuro: No focal deficits  Musculoskeletal: Muscle strength 5/5 all ext  Psychiatric: Mood and affect normal  Neck: No JVD, no carotid bruits, no thyromegaly, no lymphadenopathy.  Lungs:Clear bilaterally, no wheezes, rhonci, crackles Cardiovascular: Regular rate and rhythm. Loud, harsh, late peaking systolic murmur.  Abdomen:Soft. Bowel sounds present. Non-tender.  Extremities:  No lower extremity edema. Pulses are 2 + in the bilateral DP/PT.  EKG:  EKG is not ordered today. The ekg ordered today demonstrates   Echo 08/07/20: 1. Left ventricular ejection fraction, by estimation, is 65 to 70%. The  left ventricle has normal function. Left ventricular endocardial border  not optimally defined to evaluate regional wall motion. There is moderate  left ventricular hypertrophy. Left  ventricular diastolic parameters are consistent with Grade I diastolic  dysfunction (impaired relaxation). Elevated left atrial pressure.  2. Right ventricular systolic function is normal. The right  ventricular  size is normal.  3. The mitral valve is grossly normal. Trivial mitral valve  regurgitation. No evidence of mitral stenosis.  4. The aortic valve has an indeterminant number of cusps. There is mild  calcification of the aortic valve. There is mild thickening of the aortic  valve. Aortic valve regurgitation is mild to moderate. Severe aortic valve  stenosis. Aortic valve area, by  VTI measures 0.87 cm. Aortic valve mean gradient measures 40.0 mmHg.  Aortic valve Vmax measures 4.13 m/s.  5. Aortic dilatation noted. There is mild dilatation of the aortic root,  measuring 40 mm.  6. The inferior vena cava is normal in size with greater than 50%  respiratory variability, suggesting right atrial pressure of 3 mmHg.   Comparison(s): A prior study was performed on 05/16/2019. Aortic stenosis  has worsened and is now severe.   FINDINGS  Left Ventricle: Left ventricular ejection fraction, by estimation, is 65  to 70%. The left ventricle has normal function. Left ventricular  endocardial border not optimally defined to evaluate regional wall motion.  The left ventricular internal cavity  size was normal in size. There is moderate left ventricular hypertrophy.  Left ventricular diastolic parameters are consistent with Grade I  diastolic dysfunction (impaired relaxation). Elevated left atrial  pressure.   Right Ventricle: The right ventricular size is normal. No increase in  right ventricular wall thickness. Right ventricular systolic function is  normal.   Left Atrium: Left atrial size was normal in size.   Right Atrium: Right atrial size was not well visualized.   Pericardium: There is no evidence of pericardial effusion.   Mitral Valve: The mitral valve is grossly normal. Mild mitral annular  calcification. Trivial mitral valve regurgitation. No evidence of mitral  valve stenosis.   Tricuspid Valve: The tricuspid valve is not well visualized. Tricuspid  valve  regurgitation is mild.   Aortic Valve: The aortic valve has an indeterminant number of cusps. There  is mild calcification of the aortic valve. There is mild thickening of the  aortic valve.  There is mild aortic valve annular calcification. Aortic  valve regurgitation is mild to  moderate. Aortic regurgitation PHT measures 530 msec. Severe aortic  stenosis is present. Aortic valve mean gradient measures 40.0 mmHg. Aortic  valve peak gradient measures 68.2 mmHg. Aortic valve area, by VTI measures  0.87 cm.   Pulmonic Valve: The pulmonic valve was not well visualized. Pulmonic valve  regurgitation is not visualized. No evidence of pulmonic stenosis.   Aorta: Aortic dilatation noted. There is mild dilatation of the aortic  root, measuring 40 mm.   Pulmonary Artery: The pulmonary artery is not well seen.   Venous: The inferior vena cava is normal in size with greater than 50%  respiratory variability, suggesting right atrial pressure of 3 mmHg.   IAS/Shunts: The interatrial septum was not well visualized.     LEFT VENTRICLE  PLAX 2D  LVIDd:     4.50 cm Diastology  LVIDs:     2.60 cm LV e' medial:  4.90 cm/s  LV PW:     1.40 cm LV E/e' medial: 17.8  LV IVS:    1.20 cm LV e' lateral:  5.44 cm/s  LVOT diam:   2.50 cm LV E/e' lateral: 16.0  LV SV:     81  LV SV Index:  41  LVOT Area:   4.91 cm     RIGHT VENTRICLE       IVC  RV S prime:   13.90 cm/s IVC diam: 1.50 cm  TAPSE (M-mode): 2.0 cm   LEFT ATRIUM       Index  LA diam:    4.20 cm 2.11 cm/m  LA Vol (A2C):  50.8 ml 25.52 ml/m  LA Vol (A4C):  34.9 ml 17.53 ml/m  LA Biplane Vol: 45.1 ml 22.66 ml/m  AORTIC VALVE          PULMONIC VALVE  AV Area (Vmax):  0.86 cm   PV Vmax:    1.00 m/s  AV Area (Vmean):  0.90 cm   PV Peak grad: 4.0 mmHg  AV Area (VTI):   0.87 cm  AV Vmax:      413.00 cm/s  AV Vmean:     281.000 cm/s  AV VTI:       0.933 m  AV Peak Grad:   68.2 mmHg  AV Mean Grad:   40.0 mmHg  LVOT Vmax:     72.50 cm/s  LVOT Vmean:    51.300 cm/s  LVOT VTI:     0.165 m  LVOT/AV VTI ratio: 0.18  AI PHT:      530 msec    AORTA  Ao Root diam: 4.00 cm  Ao Asc diam: 3.30 cm  Ao Arch diam: 2.5 cm   MITRAL VALVE  MV Area (PHT): 2.76 cm   SHUNTS  MV Decel Time: 275 msec   Systemic VTI: 0.16 m  MV E velocity: 87.00 cm/s  Systemic Diam: 2.50 cm  MV A velocity: 115.00 cm/s  MV E/A ratio: 0.76   Recent Labs: 09/22/2019: ALT 8; BUN 30; Creatinine, Ser 1.12; Hemoglobin 11.4; Magnesium 2.1; Platelets 335; Potassium 4.2; Sodium 136   Lipid Panel    Component Value Date/Time   CHOL 116 12/10/2017 1659   CHOL 127 08/19/2017 1609   CHOL 109 12/25/2011 0400   TRIG 317 (H) 12/10/2017 1659   TRIG 401 (H) 08/19/2017 1609   TRIG 195 12/25/2011 0400   HDL 32 (L) 12/10/2017 1659   HDL 29 (L) 12/25/2011 0400  CHOLHDL 3.6 12/10/2017 1659   VLDL 39 12/25/2011 0400   LDLCALC 21 12/10/2017 1659   LDLCALC 41 12/25/2011 0400     Wt Readings from Last 3 Encounters:  09/10/20 193 lb (87.5 kg)  08/17/20 191 lb (86.6 kg)  06/22/20 188 lb (85.3 kg)     Other studies Reviewed: Additional studies/ records that were reviewed today include: Echo images, office notes Review of the above records demonstrates: Moderate to severe aortic stenosis   STS Risk Score: Risk of Mortality: 5.956% Renal Failure: 5.306% Permanent Stroke: 2.835% Prolonged Ventilation: 11.817% DSW Infection: 0.204% Reoperation: 3.838% Morbidity or Mortality: 17.970% Short Length of Stay: 13.499% Long Length of Stay: 12.616%  Assessment and Plan:   1. Severe Aortic Valve Stenosis: He has severe, stage C1 aortic valve stenosis. I have reviewed the natural history of aortic stenosis with the Timothy [name] and their family members  who are present today. We have discussed the limitations of medical therapy  and the poor prognosis associated with symptomatic aortic stenosis. We have reviewed potential treatment options, including palliative medical therapy, conventional surgical aortic valve replacement, and transcatheter aortic valve replacement. We discussed treatment options in the context of the Timothy [name]'s specific comorbid medical conditions.  He is confined to a wheelchair and his recliner most of the day and rarely ambulates. He has occasional dyspnea but this is mild. No prior CHF. I have personally reviewed the echo images. The aortic valve is thickened, calcified with limited leaflet mobility.  While his aortic stenosis is likely now in the severe range, he is not symptomatic. At this time, his overall quality of life seems to be very good.He is clearly not a candidate for conventional AVR by surgical approach. Given his poor functional status with advanced Parkinson's disease and advanced age, I do not think he is a candidate for TAVR. He and his daughter understand that TAVR will likely not improve his quality of life at this time.      Current medicines are reviewed at length with the Timothy [name] today.  The Timothy [name] does not have concerns regarding medicines.  The following changes have been made:  no change  Labs/ tests ordered today include:  No orders of the defined types were placed in this encounter.    Disposition:   F/U with Dr. Rockey Situ as planned.    Signed, Lauree Chandler, MD 09/10/2020 2:44 PM    [facility] [address], [place], [place]  [address] Phone: [phone]; Fax: [phone]

## 2020-09-10 NOTE — Patient Instructions (Signed)
Medication Instructions:  No changes *If you need a refill on your cardiac medications before your next appointment, please call your pharmacy*   Lab Work: none If you have labs (blood work) drawn today and your tests are completely normal, you will receive your results only by: Marland Kitchen MyChart Message (if you have MyChart) OR . A paper copy in the mail If you have any lab test that is abnormal or we need to change your treatment, we will call you to review the results.   Testing/Procedures: None

## 2020-10-27 ENCOUNTER — Emergency Department
Admission: EM | Admit: 2020-10-27 | Discharge: 2020-10-27 | Disposition: A | Discharge status: Home / Self Care | Payer: Medicare Other | Attending: Emergency Medicine | Admitting: Emergency Medicine

## 2020-10-27 ENCOUNTER — Other Ambulatory Visit: Payer: Self-pay

## 2020-10-27 DIAGNOSIS — R42 Dizziness and giddiness: Secondary | ICD-10-CM | POA: Insufficient documentation

## 2020-10-27 DIAGNOSIS — R197 Diarrhea, unspecified: Secondary | ICD-10-CM | POA: Insufficient documentation

## 2020-10-27 DIAGNOSIS — I352 Nonrheumatic aortic (valve) stenosis with insufficiency: Secondary | ICD-10-CM | POA: Insufficient documentation

## 2020-10-27 DIAGNOSIS — Z7984 Long term (current) use of oral hypoglycemic drugs: Secondary | ICD-10-CM | POA: Insufficient documentation

## 2020-10-27 DIAGNOSIS — R55 Syncope and collapse: Secondary | ICD-10-CM | POA: Insufficient documentation

## 2020-10-27 DIAGNOSIS — I5030 Unspecified diastolic (congestive) heart failure: Secondary | ICD-10-CM | POA: Diagnosis not present

## 2020-10-27 DIAGNOSIS — G20 Parkinson's disease: Secondary | ICD-10-CM | POA: Diagnosis not present

## 2020-10-27 DIAGNOSIS — Z7901 Long term (current) use of anticoagulants: Secondary | ICD-10-CM | POA: Diagnosis not present

## 2020-10-27 DIAGNOSIS — Z79899 Other long term (current) drug therapy: Secondary | ICD-10-CM | POA: Insufficient documentation

## 2020-10-27 DIAGNOSIS — I4891 Unspecified atrial fibrillation: Secondary | ICD-10-CM | POA: Insufficient documentation

## 2020-10-27 DIAGNOSIS — Z85828 Personal history of other malignant neoplasm of skin: Secondary | ICD-10-CM | POA: Diagnosis not present

## 2020-10-27 DIAGNOSIS — Z8616 Personal history of COVID-19: Secondary | ICD-10-CM | POA: Insufficient documentation

## 2020-10-27 DIAGNOSIS — I110 Hypertensive heart disease with heart failure: Secondary | ICD-10-CM | POA: Diagnosis not present

## 2020-10-27 DIAGNOSIS — E119 Type 2 diabetes mellitus without complications: Secondary | ICD-10-CM | POA: Diagnosis not present

## 2020-10-27 DIAGNOSIS — I350 Nonrheumatic aortic (valve) stenosis: Secondary | ICD-10-CM

## 2020-10-27 LAB — CBC WITH DIFFERENTIAL/PLATELET
Abs Immature Granulocytes: 0.06 10*3/uL (ref 0.00–0.07)
Basophils Absolute: 0.1 10*3/uL (ref 0.0–0.1)
Basophils Relative: 1 %
Eosinophils Absolute: 0.4 10*3/uL (ref 0.0–0.5)
Eosinophils Relative: 6 %
HCT: 33.8 % — ABNORMAL LOW (ref 39.0–52.0)
Hemoglobin: 10.5 g/dL — ABNORMAL LOW (ref 13.0–17.0)
Immature Granulocytes: 1 %
Lymphocytes Relative: 20 %
Lymphs Abs: 1.3 10*3/uL (ref 0.7–4.0)
MCH: 28.1 pg (ref 26.0–34.0)
MCHC: 31.1 g/dL (ref 30.0–36.0)
MCV: 90.4 fL (ref 80.0–100.0)
Monocytes Absolute: 0.8 10*3/uL (ref 0.1–1.0)
Monocytes Relative: 13 %
Neutro Abs: 3.7 10*3/uL (ref 1.7–7.7)
Neutrophils Relative %: 59 %
Platelets: 168 10*3/uL (ref 150–400)
RBC: 3.74 MIL/uL — ABNORMAL LOW (ref 4.22–5.81)
RDW: 15.6 % — ABNORMAL HIGH (ref 11.5–15.5)
WBC: 6.3 10*3/uL (ref 4.0–10.5)
nRBC: 0 % (ref 0.0–0.2)

## 2020-10-27 LAB — COMPREHENSIVE METABOLIC PANEL
ALT: 6 U/L (ref 0–44)
AST: 29 U/L (ref 15–41)
Albumin: 3.7 g/dL (ref 3.5–5.0)
Alkaline Phosphatase: 49 U/L (ref 38–126)
Anion gap: 10 (ref 5–15)
BUN: 17 mg/dL (ref 8–23)
CO2: 22 mmol/L (ref 22–32)
Calcium: 8.6 mg/dL — ABNORMAL LOW (ref 8.9–10.3)
Chloride: 103 mmol/L (ref 98–111)
Creatinine, Ser: 1.43 mg/dL — ABNORMAL HIGH (ref 0.61–1.24)
GFR, Estimated: 47 mL/min — ABNORMAL LOW (ref >60)
Glucose, Bld: 239 mg/dL — ABNORMAL HIGH (ref 70–99)
Potassium: 4.4 mmol/L (ref 3.5–5.1)
Sodium: 135 mmol/L (ref 135–145)
Total Bilirubin: 0.8 mg/dL (ref 0.3–1.2)
Total Protein: 6.6 g/dL (ref 6.5–8.1)

## 2020-10-27 LAB — MAGNESIUM: Magnesium: 1.9 mg/dL (ref 1.7–2.4)

## 2020-10-27 MED ORDER — LACTATED RINGERS IV BOLUS
1000.0000 mL | Freq: Once | INTRAVENOUS | Status: AC
  Administered 2020-10-27: 1000 mL via INTRAVENOUS

## 2020-10-27 NOTE — ED Notes (Signed)
Pt/caregiver verbalized understanding of d/c instructions at this time. Pt/caregiver given opportunity to ask questions as needed at this time. Pt assisted by caregiver into wheelchair and assisted to ED lobby at this time, NAD noted, RR even and unlabored at this time

## 2020-10-27 NOTE — ED Provider Notes (Signed)
[facility] Emergency Department Provider Note ____________________________________________   Event Date/Time   First MD Initiated Contact with Patient 10/27/20 [phone]     (approximate)  I have reviewed the triage vital signs and the nursing notes.  HISTORY  Chief Complaint Near Syncope   HPI Timothy [name] is a 87 y.o. malewho presents to the ED for evaluation of diarrhea and presyncope.  Chart review indicates history of Parkinson's disease, diastolic CHF, HTN, HLD, AAA, DM and Paget disease.  PAD s/p bilateral lower extremity bypass procedures.  A. fib and severe aortic stenosis on Eliquis.  Rarely ambulates with a walker and has otherwise wheelchair-bound.  Not a good surgical candidate for TAVR.  Patient presents to the ED via EMS from his local SNF due to presyncope this morning after a couple days of diarrhea.  Accompanied by caregiver who is familiar with him.  Patient reports a new syncopal episode while trying to have a BM in the toilet today.  He reports pushing because he felt he needed to pass a bowel movement, feeling dizzy and then having to stop due to this sensation.  He denies any full syncopal episodes.  Denies abdominal pain.  Denies any falls.  He reports feeling fine now and has no complaints.  Denies chest pain or neck pain.  Past Medical History:  Diagnosis Date  . (HFpEF) heart failure with preserved ejection fraction ([place])    a. 05/2019 Echo: EF 65-70%, mod LVH. Gr2 DD. Mod AS.  Marland Kitchen AAA (abdominal aortic aneurysm) ([place])    a. 2014 Abd U/S: 3.5cm AAA; b. 10/20189 Abd U/S: 3.3cm AAA; c. 01/2018 CT Abd/Pelvis: 2.7cm infrarenal AAA - rec f/u U/S in 5 yrs.  . Actinic keratosis   . Back pain   . Basal cell carcinoma 08/22/2019   Left mid ear helix. Nodular pattern  . Basal cell carcinoma 04/30/2020   Left cheek. Superficial and nodular patterns.  . Esophageal reflux   . Essential hypertension   . History of stress test    a. 01/2012 MV: EF  63%, no rwma. No ischemia/infarct. Low risk.  . Mixed hyperlipidemia   . Moderate aortic stenosis    a. 05/2019 Echo: Mod AS. Mean grad 29.5 mmHg. Peak grad 55.7 mmHg. AVA 1.28 cm^2 (VTI). No significant change from 2019 echo.  . 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 PAD (peripheral artery disease) ([place])    a. 12/2012 and 05/2013 s/p bilat LE bypass @ [facility] in setting of bilat popliteal aneurysms; b. 05/2019 LE Duplex: R PT 50-74%, Nl L Leg.  . Parkinson's disease ([place])   . Persistent atrial fibrillation ([place])    a. 02/2015 Event monitor: long periods of Afib--prev on flecainide; b. CHA2DS2VASc = 8-->Eliquis 5 bid.  . Senile osteoporosis   . Skin cancer   . Squamous cell carcinoma of skin 04/02/2017   Left cheek. SCCis. Mohs at [facility] 2018  . Swelling of right lower extremity    a. 05/2019 LE U/S: No DVT. 50-64% R PT artery stenosis.  Marland Kitchen TIA (transient ischemic attack)   . Type II diabetes mellitus [facility])     Patient Active Problem List   Diagnosis Date Noted  . Acute respiratory failure with hypoxia ([place])   . Pneumonia due to COVID-19 virus 09/18/2019  . PAD (peripheral artery disease) ([place])   . Moderate aortic stenosis   . AAA (abdominal aortic aneurysm) ([place])   . (HFpEF) heart failure with  preserved ejection fraction ([place])   . Type 2 diabetes mellitus without complication, without long-term current use of insulin ([place])   . Parkinson disease ([place]) 06/14/2018  . Behavioral change 03/25/2018  . Aortic valve stenosis 02/18/2018  . Dysphagia 12/10/2017  . Advanced care planning/counseling discussion 11/27/2016  . Pernicious anemia 05/28/2015  . Leg weakness, bilateral 04/12/2015  . Tremor 03/27/2015  . Chronic anxiety 02/20/2015  . SVT (supraventricular tachycardia) ([place]) 02/16/2015  . AF (paroxysmal atrial fibrillation) ([place]) 02/16/2015  . History of back surgery 02/16/2015  . TIA (transient ischemic attack) 12/29/2011  . Hyperlipidemia associated  with type 2 diabetes mellitus ([place]) 09/24/2011  . Essential hypertension 09/24/2011    Past Surgical History:  Procedure Laterality Date  . APPENDECTOMY    . BACK SURGERY    . CATARACT EXTRACTION    . COLONOSCOPY  [id number]  . ESOPHAGEAL DILATION    . GALLBLADDER SURGERY    . HERNIA REPAIR    . Lower extremity arterial bypass Bilateral   . RETINAL DETACHMENT SURGERY    . TONSILLECTOMY      Prior to Admission medications   Medication Sig Start Date End Date Taking? Authorizing Provider  acetaminophen (TYLENOL) 325 MG tablet Take 2 tablets (650 mg total) by mouth every 6 (six) hours as needed for mild pain or headache (fever >/= 101). 09/22/19   Val Riles, MD  Calcium Citrate-Vitamin D (CALCIUM + D PO) Take 1 tablet by mouth daily.     [provider]  carbidopa-levodopa (SINEMET IR) 25-100 MG tablet Take 1 tablet by mouth 4 (four) times daily.     [provider]  cyanocobalamin (,VITAMIN B-12,) 1000 MCG/ML injection INJECT 1ML ONCE A WEEK FOR 14 DAYS (2 WEEKS)  and  THEN RETURN TO MONTHLY FREQUENCY 06/21/20   [provider]  ELIQUIS 5 MG TABS tablet TAKE 1 TABLET BY MOUTH TWICE DAILY 03/09/20   Cannady, Jolene T, NP  famotidine (PEPCID) 20 MG tablet Take 20 mg by mouth at bedtime. 06/07/20   [provider]  FARXIGA 10 MG TABS tablet TAKE 1 TABLET BY MOUTH DAILY 05/07/20   Cannady, Henrine Screws T, NP  flecainide (TAMBOCOR) 50 MG tablet TAKE 1 TABLET BY MOUTH TWICE DAILY 06/13/20   Gollan, Kathlene November, MD  glipiZIDE-metformin (METAGLIP) 5-500 MG tablet TAKE 1 TABLET BY MOUTH TWICE DAILY 08/09/20   Cannady, Henrine Screws T, NP  GLUCOSAMINE-CHONDROITIN PO Take by mouth daily.    [provider]  hydrocortisone 2.5 % cream Apply 1-2 times daily to areas at neck, face, scalp as needed for itch. 04/30/20   Brendolyn Patty, MD  JANUVIA 100 MG tablet TAKE 1 TABLET BY MOUTH DAILY 05/07/20   Cannady, Henrine Screws T, NP  metoprolol succinate (TOPROL-XL) 50 MG 24 hr tablet TAKE  1 TABLET BY MOUTH DAILY 03/09/20   Cannady, Henrine Screws T, NP  Multiple Vitamins-Minerals (CEROVITE SENIOR PO) Take 1 tablet by mouth daily.    [provider]  pantoprazole (PROTONIX) 40 MG tablet TAKE 1 TABLET BY MOUTH TWICE DAILY 03/09/20   Cannady, Jolene T, NP  rosuvastatin (CRESTOR) 10 MG tablet TAKE 1 TABLET BY MOUTH DAILY 03/09/20   Cannady, Jolene T, NP  sertraline (ZOLOFT) 50 MG tablet Take 1 tablet (50 mg total) by mouth daily. 11/17/19   Cannady, Jolene T, NP  UNABLE TO FIND Take 100 mg by mouth daily. CBD 100mg     [provider]  [place] Name: [facility]    [provider]    Allergies Patient has no known allergies.  Family History  Problem Relation Age of Onset  . Heart disease Mother   . Diabetes Father   . Diabetes Other        grandparents  . Heart disease Brother   . Stroke Brother     Social History Social History   Tobacco Use  . Smoking status: Never Smoker  . Smokeless tobacco: Never Used  Vaping Use  . Vaping Use: Never used  Substance Use Topics  . Alcohol use: No  . Drug use: No    Review of Systems  Constitutional: No fever/chills.  Positive for presyncopal dizziness Eyes: No visual changes. ENT: No sore throat. Cardiovascular: Denies chest pain. Respiratory: Denies shortness of breath. Gastrointestinal: No abdominal pain.  No nausea, no vomiting.  No diarrhea.  No constipation. Genitourinary: Negative for dysuria. Musculoskeletal: Negative for back pain. Skin: Negative for rash. Neurological: Negative for headaches, focal weakness or numbness.  ____________________________________________   PHYSICAL EXAM:  VITAL SIGNS: Vitals:   10/27/20 1003  BP: (!) 147/75  Pulse: 62  Resp: 14  Temp: 97.7 F (36.5 C)  SpO2: 100%     Constitutional: Alert and oriented. Well appearing and in no acute distress. Eyes: Conjunctivae are normal. PERRL. EOMI. Head: Atraumatic. Nose: No  congestion/rhinnorhea. Mouth/Throat: Mucous membranes are moist.  Oropharynx non-erythematous. Neck: No stridor. No cervical spine tenderness to palpation. Cardiovascular: Normal rate, regular rhythm.  Blowing systolic murmur of aortic stenosis is noted.  Good peripheral circulation. Respiratory: Normal respiratory effort.  No retractions. Lungs CTAB. Gastrointestinal: Soft , nondistended, nontender to palpation. No CVA tenderness. Musculoskeletal: No lower extremity tenderness nor edema.  No joint effusions. No signs of acute trauma. Neurologic:  Normal speech and language. No gross focal neurologic deficits are appreciated.  Skin:  Skin is warm, dry and intact. No rash noted. Psychiatric: Mood and affect are normal. Speech and behavior are normal.  ____________________________________________   LABS (all labs ordered are listed, but only abnormal results are displayed)  Labs Reviewed  COMPREHENSIVE METABOLIC PANEL - Abnormal; Notable for the following components:      Result Value   Glucose, Bld 239 (*)    Creatinine, Ser 1.43 (*)    Calcium 8.6 (*)    GFR, Estimated 47 (*)    All other components within normal limits  CBC WITH DIFFERENTIAL/PLATELET - Abnormal; Notable for the following components:   RBC 3.74 (*)    Hemoglobin 10.5 (*)    HCT 33.8 (*)    RDW 15.6 (*)    All other components within normal limits  MAGNESIUM   ____________________________________________  12 Lead EKG  Sinus rhythm, rate of 61 bpm.  Bifascicular block, normal axis without evidence of acute ischemia.  ____________________________________________   PROCEDURES and INTERVENTIONS  Procedure(s) performed (including Critical Care):  .1-3 Lead EKG Interpretation Performed by: Vladimir Crofts, MD Authorized by: Vladimir Crofts, MD     ECG rate:  62   ECG rate assessment: normal     Rhythm: sinus rhythm     Ectopy: none     Conduction: normal      Medications  lactated ringers bolus 1,000 mL  (0 mLs Intravenous Stopped 10/27/20 1150)    ____________________________________________   MDM / ED COURSE   87 year old male with known history of aortic stenosis presents to the ED from local SNF with transient episode of presyncopal dizziness while trying to pass a bowel movement, without evidence of acute derangements, amenable  to return to facility.  Normal vitals.  Exam without evidence of acute derangements.  Known systolic murmur.  He has no chest pain or any symptoms here in the ED.  Work-up is benign and patient is requesting discharge.  I discussed with caregiver the possibility that his chronic diarrhea contributed to a mild degree of dehydration, Further contributing to his dizziness and possible symptomatic aortic stenosis.  We discussed outpatient management and return precautions for the ED.     ____________________________________________   FINAL CLINICAL IMPRESSION(S) / ED DIAGNOSES  Final diagnoses:  Dizziness  Aortic valve stenosis, etiology of cardiac valve disease unspecified  Diarrhea, unspecified type     ED Discharge Orders    None       [place]   Note:  This document was prepared using Dragon voice recognition software and may include unintentional dictation errors.   Vladimir Crofts, MD 10/27/20 [phone]

## 2020-10-27 NOTE — ED Notes (Signed)
Pt presents to ED via EMS from [facility] assisted with c/o of having a near syncopal episode today while trying to have a BM on the toilet. Pt states he was pushing hard because "he knew he needed to go'. Pt states diarrhea for the past few days. Pt denies ABD pain, N/V. NAD noted. Pt is A&Ox4 at this time. Pt denies falling at any time during this episode.

## 2020-10-27 NOTE — Discharge Instructions (Signed)
I suspect Timothy [name] was a little dehydrated due to having diarrhea for the past few weeks. This is enough to cause the dizziness he was feeling.   He also has known aortic stenosis, thickening of one of the heart valves.  If he is more dehydrated, this can further cause dizziness.   We gave him half a bag of fluids in the IV.  Please ensure that he is staying hydrated if he continues to have diarrhea.  If he develops abdominal pain, bloody diarrhea or passing out of the way, please return to the ED.

## 2020-11-05 ENCOUNTER — Other Ambulatory Visit: Payer: Self-pay | Admitting: Cardiovascular Disease

## 2020-12-06 ENCOUNTER — Other Ambulatory Visit: Payer: Self-pay | Admitting: Nurse Practitioner

## 2020-12-06 NOTE — Telephone Encounter (Signed)
This is not a pt of [facility]

## 2020-12-06 NOTE — Telephone Encounter (Signed)
Requested medication (s) are due for refill today:   Yes  Requested medication (s) are on the active medication list:   Yes  Future visit scheduled:   No  Pt in an assisted living facility and seen by doctors there in reading chart but not sure.   Last ordered: 11/17/2019 #90, 4 refills  Returned because failed protocol due to an invalid visit in the last 6 months.    There is a note with the pharmacy request "Pt needs refill ASAP for bubble packs"   Provider to review for refills.   Requested Prescriptions  Pending Prescriptions Disp Refills   sertraline (ZOLOFT) 50 MG tablet [Pharmacy Med Name: SERTRALINE HCL 50 MG TAB] 90 tablet 4    Sig: TAKE 1 TABLET BY MOUTH DAILY.      Psychiatry:  Antidepressants - SSRI Failed - 12/06/2020  1:37 PM      Failed - Valid encounter within last 6 months    Recent Outpatient Visits           1 year ago Type 2 diabetes mellitus with other circulatory complication, without long-term current use of insulin ([place])   [place], Jeannette How, MD   2 years ago Essential hypertension   [place], Jeannette How, MD   2 years ago Essential hypertension   [place], Jeannette How, MD   2 years ago Hospital discharge follow-up   [facility] Trinna Post, PA-C   2 years ago Essential hypertension   [place], Mark A, MD       Future Appointments             In 2 months Brendolyn Patty, MD [place]   In 2 months [place], Kathlene November, MD [facility], [place]

## 2021-01-05 ENCOUNTER — Emergency Department
Admission: EM | Admit: 2021-01-05 | Discharge: 2021-01-05 | Disposition: A | Discharge status: Skilled Nursing Facility | Payer: Medicare Other | Attending: Student in an Organized Health Care Education/Training Program | Admitting: Student in an Organized Health Care Education/Training Program

## 2021-01-05 ENCOUNTER — Other Ambulatory Visit: Payer: Self-pay

## 2021-01-05 ENCOUNTER — Emergency Department: Payer: Medicare Other

## 2021-01-05 DIAGNOSIS — Z85828 Personal history of other malignant neoplasm of skin: Secondary | ICD-10-CM | POA: Insufficient documentation

## 2021-01-05 DIAGNOSIS — E119 Type 2 diabetes mellitus without complications: Secondary | ICD-10-CM | POA: Insufficient documentation

## 2021-01-05 DIAGNOSIS — G20 Parkinson's disease: Secondary | ICD-10-CM | POA: Insufficient documentation

## 2021-01-05 DIAGNOSIS — Z7984 Long term (current) use of oral hypoglycemic drugs: Secondary | ICD-10-CM | POA: Insufficient documentation

## 2021-01-05 DIAGNOSIS — R42 Dizziness and giddiness: Secondary | ICD-10-CM | POA: Diagnosis present

## 2021-01-05 DIAGNOSIS — Z79899 Other long term (current) drug therapy: Secondary | ICD-10-CM | POA: Diagnosis not present

## 2021-01-05 DIAGNOSIS — I480 Paroxysmal atrial fibrillation: Secondary | ICD-10-CM | POA: Insufficient documentation

## 2021-01-05 DIAGNOSIS — E860 Dehydration: Secondary | ICD-10-CM | POA: Diagnosis not present

## 2021-01-05 DIAGNOSIS — Z8616 Personal history of COVID-19: Secondary | ICD-10-CM | POA: Insufficient documentation

## 2021-01-05 DIAGNOSIS — I10 Essential (primary) hypertension: Secondary | ICD-10-CM | POA: Insufficient documentation

## 2021-01-05 DIAGNOSIS — Z7901 Long term (current) use of anticoagulants: Secondary | ICD-10-CM | POA: Insufficient documentation

## 2021-01-05 LAB — URINALYSIS, COMPLETE (UACMP) WITH MICROSCOPIC
Bilirubin Urine: NEGATIVE
Glucose, UA: >=500 mg/dL — AB
Hgb urine dipstick: NEGATIVE
Ketones, ur: 5 mg/dL — AB
Leukocytes,Ua: NEGATIVE
Nitrite: NEGATIVE
Protein, ur: NEGATIVE mg/dL
Specific Gravity, Urine: 1.03 (ref 1.005–1.030)
pH: 5 (ref 5.0–8.0)

## 2021-01-05 LAB — CBC WITH DIFFERENTIAL/PLATELET
Abs Immature Granulocytes: 0.05 10*3/uL (ref 0.00–0.07)
Basophils Absolute: 0.1 10*3/uL (ref 0.0–0.1)
Basophils Relative: 1 %
Eosinophils Absolute: 0.3 10*3/uL (ref 0.0–0.5)
Eosinophils Relative: 4 %
HCT: 34.9 % — ABNORMAL LOW (ref 39.0–52.0)
Hemoglobin: 11 g/dL — ABNORMAL LOW (ref 13.0–17.0)
Immature Granulocytes: 1 %
Lymphocytes Relative: 19 %
Lymphs Abs: 1.6 10*3/uL (ref 0.7–4.0)
MCH: 28.4 pg (ref 26.0–34.0)
MCHC: 31.5 g/dL (ref 30.0–36.0)
MCV: 89.9 fL (ref 80.0–100.0)
Monocytes Absolute: 1 10*3/uL (ref 0.1–1.0)
Monocytes Relative: 12 %
Neutro Abs: 5.3 10*3/uL (ref 1.7–7.7)
Neutrophils Relative %: 63 %
Platelets: 175 10*3/uL (ref 150–400)
RBC: 3.88 MIL/uL — ABNORMAL LOW (ref 4.22–5.81)
RDW: 15.8 % — ABNORMAL HIGH (ref 11.5–15.5)
WBC: 8.4 10*3/uL (ref 4.0–10.5)
nRBC: 0 % (ref 0.0–0.2)

## 2021-01-05 LAB — COMPREHENSIVE METABOLIC PANEL
ALT: <5 U/L (ref 0–44)
AST: 26 U/L (ref 15–41)
Albumin: 3.8 g/dL (ref 3.5–5.0)
Alkaline Phosphatase: 48 U/L (ref 38–126)
Anion gap: 9 (ref 5–15)
BUN: 15 mg/dL (ref 8–23)
CO2: 24 mmol/L (ref 22–32)
Calcium: 8.5 mg/dL — ABNORMAL LOW (ref 8.9–10.3)
Chloride: 103 mmol/L (ref 98–111)
Creatinine, Ser: 1.26 mg/dL — ABNORMAL HIGH (ref 0.61–1.24)
GFR, Estimated: 55 mL/min — ABNORMAL LOW (ref >60)
Glucose, Bld: 228 mg/dL — ABNORMAL HIGH (ref 70–99)
Potassium: 3.9 mmol/L (ref 3.5–5.1)
Sodium: 136 mmol/L (ref 135–145)
Total Bilirubin: 0.8 mg/dL (ref 0.3–1.2)
Total Protein: 6.8 g/dL (ref 6.5–8.1)

## 2021-01-05 LAB — TROPONIN I (HIGH SENSITIVITY)
Troponin I (High Sensitivity): 9 ng/L (ref <18)
Troponin I (High Sensitivity): 8 ng/L (ref <18)

## 2021-01-05 MED ORDER — SODIUM CHLORIDE 0.9 % IV BOLUS
500.0000 mL | Freq: Once | INTRAVENOUS | Status: AC
  Administered 2021-01-05: 500 mL via INTRAVENOUS

## 2021-01-05 NOTE — ED Triage Notes (Signed)
Patient brought in via EMS from [facility]. Patient reports sitting at breakfast table and started feeling dizzy and pale. Per ems fire got BP of 80s/50s. EMD had BP 110/s/50s.. Denies any recent falls or injury

## 2021-01-05 NOTE — ED Provider Notes (Signed)
[facility] Emergency Department Provider Note    Event Date/Time   First MD Initiated Contact with Patient 01/05/21 0957     (approximate)  I have reviewed the triage vital signs and the nursing notes.   HISTORY  Chief Complaint Dizziness    HPI Timothy [name] is a 87 y.o. male below listed past medical history presents to the ER for lightheadedness and generalized weakness that occurred while he was sitting at breakfast.  Denies any pain or pressure.  States he woke up feeling normal.  Feels like his symptoms were related to his blood sugar getting low but his blood sugar was reportedly in the 140s.  Reportedly had low blood pressure when fire arrived.  States has been eating and drinking normally.  No medication changes.  No measured fevers.  Denies any numbness or tingling.  Past Medical History:  Diagnosis Date   (HFpEF) heart failure with preserved ejection fraction ([place])    a. 05/2019 Echo: EF 65-70%, mod LVH. Gr2 DD. Mod AS.   AAA (abdominal aortic aneurysm) ([place])    a. 2014 Abd U/S: 3.5cm AAA; b. 10/20189 Abd U/S: 3.3cm AAA; c. 01/2018 CT Abd/Pelvis: 2.7cm infrarenal AAA - rec f/u U/S in 5 yrs.   Actinic keratosis    Back pain    Basal cell carcinoma 08/22/2019   Left mid ear helix. Nodular pattern   Basal cell carcinoma 04/30/2020   Left cheek. Superficial and nodular patterns.   Esophageal reflux    Essential hypertension    History of stress test    a. 01/2012 MV: EF 63%, no rwma. No ischemia/infarct. Low risk.   Mixed hyperlipidemia    Moderate aortic stenosis    a. 05/2019 Echo: Mod AS. Mean grad 29.5 mmHg. Peak grad 55.7 mmHg. AVA 1.28 cm^2 (VTI). No significant change from 2019 echo.   Osteitis deformans without mention of bone tumor    Paget's Disease   PAD (peripheral artery disease) ([place])    a. 12/2012 and 05/2013 s/p bilat LE bypass @ [facility] in setting of bilat popliteal aneurysms; b. 05/2019 LE Duplex: R PT 50-74%, Nl L Leg.    Parkinson's disease ([place])    Persistent atrial fibrillation ([place])    a. 02/2015 Event monitor: long periods of Afib--prev on flecainide; b. CHA2DS2VASc = 8-->Eliquis 5 bid.   Senile osteoporosis    Skin cancer    Squamous cell carcinoma of skin 04/02/2017   Left cheek. SCCis. Mohs at [facility] 2018   Swelling of right lower extremity    a. 05/2019 LE U/S: No DVT. 50-64% R PT artery stenosis.   TIA (transient ischemic attack)    Type II diabetes mellitus ([place])    Family History  Problem Relation Age of Onset   Heart disease Mother    Diabetes Father    Diabetes Other        grandparents   Heart disease Brother    Stroke Brother    Past Surgical History:  Procedure Laterality Date   APPENDECTOMY     BACK SURGERY     CATARACT EXTRACTION     COLONOSCOPY  2006,2009   ESOPHAGEAL DILATION     GALLBLADDER SURGERY     HERNIA REPAIR     Lower extremity arterial bypass Bilateral    RETINAL DETACHMENT SURGERY     TONSILLECTOMY     Patient Active Problem List   Diagnosis Date Noted   Acute respiratory failure with hypoxia (HCC)    Pneumonia  due to COVID-19 virus 09/18/2019   PAD (peripheral artery disease) (HCC)    Moderate aortic stenosis    AAA (abdominal aortic aneurysm) (HCC)    (HFpEF) heart failure with preserved ejection fraction ([place])    Type 2 diabetes mellitus without complication, without long-term current use of insulin ([place])    Parkinson disease ([place]) 06/14/2018   Behavioral change 03/25/2018   Aortic valve stenosis 02/18/2018   Dysphagia 12/10/2017   Advanced care planning/counseling discussion 11/27/2016   Pernicious anemia 05/28/2015   Leg weakness, bilateral 04/12/2015   Tremor 03/27/2015   Chronic anxiety 02/20/2015   SVT (supraventricular tachycardia) ([place]) 02/16/2015   AF (paroxysmal atrial fibrillation) ([place]) 02/16/2015   History of back surgery 02/16/2015   TIA (transient ischemic attack) 12/29/2011   Hyperlipidemia associated with type 2 diabetes mellitus  ([place]) 09/24/2011   Essential hypertension 09/24/2011      Prior to Admission medications   Medication Sig Start Date End Date Taking? Authorizing Provider  acetaminophen (TYLENOL) 325 MG tablet Take 2 tablets (650 mg total) by mouth every 6 (six) hours as needed for mild pain or headache (fever >/= 101). 09/22/19   Val Riles, MD  Calcium Citrate-Vitamin D (CALCIUM + D PO) Take 1 tablet by mouth daily.     [provider]  carbidopa-levodopa (SINEMET IR) 25-100 MG tablet Take 1 tablet by mouth 4 (four) times daily.     [provider]  cyanocobalamin (,VITAMIN B-12,) 1000 MCG/ML injection INJECT 1ML ONCE A WEEK FOR 14 DAYS (2 WEEKS)  and  THEN RETURN TO MONTHLY FREQUENCY 06/21/20   [provider]  ELIQUIS 5 MG TABS tablet TAKE 1 TABLET BY MOUTH TWICE DAILY 03/09/20   Cannady, Jolene T, NP  famotidine (PEPCID) 20 MG tablet Take 20 mg by mouth at bedtime. 06/07/20   [provider]  FARXIGA 10 MG TABS tablet TAKE 1 TABLET BY MOUTH DAILY 05/07/20   Cannady, Henrine Screws T, NP  flecainide (TAMBOCOR) 50 MG tablet TAKE 1 TABLET BY MOUTH TWICE DAILY 11/05/20   [name], MD  glipiZIDE-metformin (METAGLIP) 5-500 MG tablet TAKE 1 TABLET BY MOUTH TWICE DAILY 08/09/20   Cannady, Henrine Screws T, NP  GLUCOSAMINE-CHONDROITIN PO Take by mouth daily.    [provider]  hydrocortisone 2.5 % cream Apply 1-2 times daily to areas at neck, face, scalp as needed for itch. 04/30/20   Brendolyn Patty, MD  JANUVIA 100 MG tablet TAKE 1 TABLET BY MOUTH DAILY 05/07/20   Cannady, Henrine Screws T, NP  metoprolol succinate (TOPROL-XL) 50 MG 24 hr tablet TAKE 1 TABLET BY MOUTH DAILY 03/09/20   Cannady, Henrine Screws T, NP  Multiple Vitamins-Minerals (CEROVITE SENIOR PO) Take 1 tablet by mouth daily.    [provider]  pantoprazole (PROTONIX) 40 MG tablet TAKE 1 TABLET BY MOUTH TWICE DAILY 03/09/20   Cannady, Jolene T, NP  rosuvastatin (CRESTOR) 10 MG tablet TAKE 1 TABLET BY MOUTH DAILY 03/09/20    Cannady, Jolene T, NP  sertraline (ZOLOFT) 50 MG tablet Take 1 tablet (50 mg total) by mouth daily. 11/17/19   Cannady, Jolene T, NP  UNABLE TO FIND Take 100 mg by mouth daily. CBD 100mg     [provider]  Karen Kays TO FIND Med Name: [place]    [provider]    Allergies Patient has no known allergies.    Social History Social History   Tobacco Use   Smoking status: Never   Smokeless tobacco: Never  Vaping Use  Vaping Use: Never used  Substance Use Topics   Alcohol use: No   Drug use: No    Review of Systems Patient denies headaches, rhinorrhea, blurry vision, numbness, shortness of breath, chest pain, edema, cough, abdominal pain, nausea, vomiting, diarrhea, dysuria, fevers, rashes or hallucinations unless otherwise stated above in HPI. ____________________________________________   PHYSICAL EXAM:  VITAL SIGNS: Vitals:   01/05/21 1200 01/05/21 1300  BP: (!) 127/56 131/66  Pulse: 61 (!) 58  Resp: 17 15  Temp:    SpO2: 99% 100%    Constitutional: Alert and oriented. Chronically ill appearing Eyes: Conjunctivae are normal.  Head: Atraumatic. Nose: No congestion/rhinnorhea. Mouth/Throat: Mucous membranes are moist.   Neck: No stridor. Painless ROM.  Cardiovascular: Normal rate, regular rhythm. Grossly normal heart sounds.  Good peripheral circulation. Respiratory: Normal respiratory effort.  No retractions. Lungs CTAB. Gastrointestinal: Soft and nontender. No distention. No abdominal bruits. No CVA tenderness. Genitourinary: deferred Musculoskeletal: No lower extremity tenderness nor edema.  No joint effusions. Neurologic:  Normal speech and language. No gross focal neurologic deficits are appreciated. No facial droop Skin:  Skin is warm, dry and intact. No rash noted. Psychiatric: Mood and affect are normal. Speech and behavior are normal.  ____________________________________________   LABS (all labs ordered are listed, but  only abnormal results are displayed)  Results for orders placed or performed during the hospital encounter of 01/05/21 (from the past 24 hour(s))  CBC with Differential/Platelet     Status: Abnormal   Collection Time: 01/05/21  9:40 AM  Result Value Ref Range   WBC 8.4 4.0 - 10.5 K/uL   RBC 3.88 (L) 4.22 - 5.81 MIL/uL   Hemoglobin 11.0 (L) 13.0 - 17.0 g/dL   HCT 34.9 (L) 39.0 - 52.0 %   MCV 89.9 80.0 - 100.0 fL   MCH 28.4 26.0 - 34.0 pg   MCHC 31.5 30.0 - 36.0 g/dL   RDW 15.8 (H) 11.5 - 15.5 %   Platelets 175 150 - 400 K/uL   nRBC 0.0 0.0 - 0.2 %   Neutrophils Relative % 63 %   Neutro Abs 5.3 1.7 - 7.7 K/uL   Lymphocytes Relative 19 %   Lymphs Abs 1.6 0.7 - 4.0 K/uL   Monocytes Relative 12 %   Monocytes Absolute 1.0 0.1 - 1.0 K/uL   Eosinophils Relative 4 %   Eosinophils Absolute 0.3 0.0 - 0.5 K/uL   Basophils Relative 1 %   Basophils Absolute 0.1 0.0 - 0.1 K/uL   Immature Granulocytes 1 %   Abs Immature Granulocytes 0.05 0.00 - 0.07 K/uL  Comprehensive metabolic panel     Status: Abnormal   Collection Time: 01/05/21  9:40 AM  Result Value Ref Range   Sodium 136 135 - 145 mmol/L   Potassium 3.9 3.5 - 5.1 mmol/L   Chloride 103 98 - 111 mmol/L   CO2 24 22 - 32 mmol/L   Glucose, Bld 228 (H) 70 - 99 mg/dL   BUN 15 8 - 23 mg/dL   Creatinine, Ser 1.26 (H) 0.61 - 1.24 mg/dL   Calcium 8.5 (L) 8.9 - 10.3 mg/dL   Total Protein 6.8 6.5 - 8.1 g/dL   Albumin 3.8 3.5 - 5.0 g/dL   AST 26 15 - 41 U/L   ALT <5 0 - 44 U/L   Alkaline Phosphatase 48 38 - 126 U/L   Total Bilirubin 0.8 0.3 - 1.2 mg/dL   GFR, Estimated 55 (L) >60 mL/min   Anion gap 9 5 -  15  Troponin I (High Sensitivity)     Status: None   Collection Time: 01/05/21  9:40 AM  Result Value Ref Range   Troponin I (High Sensitivity) 9 <18 ng/L  Urinalysis, Complete w Microscopic Urine, Clean Catch     Status: Abnormal   Collection Time: 01/05/21 11:23 AM  Result Value Ref Range   Color, Urine YELLOW (A) YELLOW    APPearance CLEAR (A) CLEAR   Specific Gravity, Urine 1.030 1.005 - 1.030   pH 5.0 5.0 - 8.0   Glucose, UA >=500 (A) NEGATIVE mg/dL   Hgb urine dipstick NEGATIVE NEGATIVE   Bilirubin Urine NEGATIVE NEGATIVE   Ketones, ur 5 (A) NEGATIVE mg/dL   Protein, ur NEGATIVE NEGATIVE mg/dL   Nitrite NEGATIVE NEGATIVE   Leukocytes,Ua NEGATIVE NEGATIVE   RBC / HPF 0-5 0 - 5 RBC/hpf   WBC, UA 0-5 0 - 5 WBC/hpf   Bacteria, UA RARE (A) NONE SEEN   Squamous Epithelial / LPF 0-5 0 - 5   Mucus PRESENT   Troponin I (High Sensitivity)     Status: None   Collection Time: 01/05/21 12:59 PM  Result Value Ref Range   Troponin I (High Sensitivity) 8 <18 ng/L   ____________________________________________  EKG My review and personal interpretation at Time: 9:30   Indication: weakness  Rate: 70  Rhythm: sinus Axis: left Other: normal intervals, no stemi, nonspecific st abn ____________________________________________  RADIOLOGY  I personally reviewed all radiographic images ordered to evaluate for the above acute complaints and reviewed radiology reports and findings.  These findings were personally discussed with the patient.  Please see medical record for radiology report.  ____________________________________________   PROCEDURES  Procedure(s) performed:  Procedures    Critical Care performed: no ____________________________________________   INITIAL IMPRESSION / ASSESSMENT AND PLAN / ED COURSE  Pertinent labs & imaging results that were available during my care of the patient were reviewed by me and considered in my medical decision making (see chart for details).   DDX: Dehydration, electrolyte abnormality, anemia, UTI, dysrhythmia, sepsis, CHF, hypoglycemia, orthostasis  Timothy [name] is a 87 y.o. who presents to the ED with presentation as described above.  Patient nontoxic-appearing but elderly with multiple comorbidities.  Denies any chest pain or pressure.  Blood pressure stable upon  arrival.  Normal glucose.  Does not seem consistent with ACS.  No respiratory symptoms.  Will give IV fluidssuspect a component of dehydration.  His abdominal exam is soft and benign.  No melena or hematochezia.  Neuro exam is nonfocal.  Will observe on telemetry.  Clinical Course as of 01/05/21 1405  Sat Jan 05, 2021  1126 Patient reassessed.  He feels well.  IV fluids running.  Blood work thus far is reassuring. [PR]  1403 Patient's repeat troponin negative.  Feels well.  Does not feel orthostatic with standing.  As he did not fully lose consciousness his blood pressure and assessment improved after IV fluids I do believe he stable and appropriate for outpatient follow-up.  No infectious symptoms.  Neuro exam remains nonfocal and at baseline. [PR]  1403 Outpatient.  Patient requesting discharge at this time and I think that is reasonable.  Patient to follow-up with cardiology and PCP as an outpatient.  We discussed strict return precautions [PR]    Clinical Course User Index [PR] Merlyn Lot, MD    The patient was evaluated in Emergency Department today for the symptoms described in the history of present illness. He/she was evaluated in  the context of the global COVID-19 pandemic, which necessitated consideration that the patient might be at risk for infection with the SARS-CoV-2 virus that causes COVID-19. Institutional protocols and algorithms that pertain to the evaluation of patients at risk for COVID-19 are in a state of rapid change based on information released by regulatory bodies including the CDC and federal and state organizations. These policies and algorithms were followed during the patient's care in the ED.  As part of my medical decision making, I reviewed the following data within the [facility] notes reviewed and incorporated, Labs reviewed, notes from prior ED visits and [place] Controlled Substance  Database   ____________________________________________   FINAL CLINICAL IMPRESSION(S) / ED DIAGNOSES  Final diagnoses:  Lightheadedness  Dehydration      NEW MEDICATIONS STARTED DURING THIS VISIT:  New Prescriptions   No medications on file     Note:  This document was prepared using Dragon voice recognition software and may include unintentional dictation errors.    Merlyn Lot, MD 01/05/21 [phone]

## 2021-02-05 ENCOUNTER — Ambulatory Visit (INDEPENDENT_AMBULATORY_CARE_PROVIDER_SITE_OTHER): Payer: Medicare Other | Admitting: Dermatology

## 2021-02-05 ENCOUNTER — Other Ambulatory Visit: Payer: Self-pay

## 2021-02-05 DIAGNOSIS — Z85828 Personal history of other malignant neoplasm of skin: Secondary | ICD-10-CM

## 2021-02-05 DIAGNOSIS — L570 Actinic keratosis: Secondary | ICD-10-CM | POA: Diagnosis not present

## 2021-02-05 DIAGNOSIS — L821 Other seborrheic keratosis: Secondary | ICD-10-CM

## 2021-02-05 DIAGNOSIS — L219 Seborrheic dermatitis, unspecified: Secondary | ICD-10-CM | POA: Diagnosis not present

## 2021-02-05 DIAGNOSIS — L820 Inflamed seborrheic keratosis: Secondary | ICD-10-CM | POA: Diagnosis not present

## 2021-02-05 DIAGNOSIS — Z86007 Personal history of in-situ neoplasm of skin: Secondary | ICD-10-CM | POA: Diagnosis not present

## 2021-02-05 DIAGNOSIS — Z872 Personal history of diseases of the skin and subcutaneous tissue: Secondary | ICD-10-CM

## 2021-02-05 MED ORDER — KETOCONAZOLE 2 % EX CREA: TOPICAL_CREAM | CUTANEOUS | 2 refills | Status: DC

## 2021-02-05 NOTE — Progress Notes (Signed)
Follow-Up Visit   Subjective  Timothy [name] is a 87 y.o. male who presents for the following: Follow-up (Patient here today for AK follow up. Patient does have a hx of BCC. He has a spot at right cheek that he would like checked. ). Also R elbow. He tends to pick at them.  Patient accompanied by daughter, [name].   The following portions of the chart were reviewed this encounter and updated as appropriate:       Review of Systems:  No other skin or systemic complaints except as noted in HPI or Assessment and Plan.  Objective  Well appearing patient in no apparent distress; mood and affect are within normal limits.  A focused examination was performed including face, scalp, arms. Relevant physical exam findings are noted in the Assessment and Plan.  Right cheek x 1, right elbow x 1 (2) Erythematous keratotic or waxy stuck-on papule  Left zygoma x 1, inferior vertex scalp x 5 (6) Erythematous thin papules/macules with gritty scale.   Scalp Pink scaliness right temporal scalp, nasolabial folds, alar crease, chin, temples, lateral cheek and ears   Assessment & Plan  Inflamed seborrheic keratosis Right cheek x 1, right elbow x 1  Destruction of lesion - Right cheek x 1, right elbow x 1  Destruction method: cryotherapy   Informed consent: discussed and consent obtained   Lesion destroyed using liquid nitrogen: Yes   Region frozen until ice ball extended beyond lesion: Yes   Outcome: patient tolerated procedure well with no complications   Post-procedure details: wound care instructions given   Additional details:  Prior to procedure, discussed risks of blister formation, small wound, skin dyspigmentation, or rare scar following cryotherapy. Recommend Vaseline ointment to treated areas while healing.   AK (actinic keratosis) (6) Left zygoma x 1, inferior vertex scalp x 5  Actinic keratoses are precancerous spots that appear secondary to cumulative UV radiation exposure/sun  exposure over time. They are chronic with expected duration over 1 year. A portion of actinic keratoses will progress to squamous cell carcinoma of the skin. It is not possible to reliably predict which spots will progress to skin cancer and so treatment is recommended to prevent development of skin cancer.  Recommend daily broad spectrum sunscreen SPF 30+ to sun-exposed areas, reapply every 2 hours as needed.  Recommend staying in the shade or wearing long sleeves, sun glasses (UVA+UVB protection) and wide brim hats (4-inch brim around the entire circumference of the hat). Call for new or changing lesions.  Destruction of lesion - Left zygoma x 1, inferior vertex scalp x 5  Destruction method: cryotherapy   Informed consent: discussed and consent obtained   Lesion destroyed using liquid nitrogen: Yes   Region frozen until ice ball extended beyond lesion: Yes   Outcome: patient tolerated procedure well with no complications   Post-procedure details: wound care instructions given   Additional details:  Prior to procedure, discussed risks of blister formation, small wound, skin dyspigmentation, or rare scar following cryotherapy. Recommend Vaseline ointment to treated areas while healing.   Seborrheic dermatitis Scalp  Seborrheic Dermatitis  -  is a chronic persistent rash characterized by pinkness and scaling most commonly of the mid face but also can occur on the scalp (dandruff), ears; mid chest and mid back. It tends to be exacerbated by stress and cooler weather.  People who have neurologic disease may experience new onset or exacerbation of existing seborrheic dermatitis.  The condition is not curable but  treatable and can be controlled.  Start ketoconazole 2% cream once daily to pink scaly patches at face, ears and scalp.  May continue HC 2.5% cream qd/bid as needed for flares.   ketoconazole (NIZORAL) 2 % cream - Scalp once daily to pink scaly patches at face, ears and  scalp.  Related Medications hydrocortisone 2.5 % cream Apply 1-2 times daily to areas at neck, face, scalp as needed for itch.  Seborrheic Keratoses - Stuck-on, waxy, tan-brown papules and/or plaques  - Benign-appearing - Discussed benign etiology and prognosis. - Observe - Call for any changes  History of PreCancerous Actinic Keratosis  - site(s) of PreCancerous Actinic Keratosis clear today. - these may recur and new lesions may form requiring treatment to prevent transformation into skin cancer - observe for new or changing spots and contact [facility] for appointment if occur - photoprotection with sun protective clothing; sunglasses and broad spectrum sunscreen with SPF of at least 30 + and frequent self skin exams recommended - yearly exams by a dermatologist recommended for persons with history of PreCancerous Actinic Keratoses  History of Squamous Cell Carcinoma in Situ of the Skin - No evidence of recurrence today at left cheek - Recommend regular full body skin exams - Recommend daily broad spectrum sunscreen SPF 30+ to sun-exposed areas, reapply every 2 hours as needed.  - Call if any new or changing lesions are noted between office visits  History of Basal Cell Carcinoma of the Skin - No evidence of recurrence today at left cheek - Recommend regular full body skin exams - Recommend daily broad spectrum sunscreen SPF 30+ to sun-exposed areas, reapply every 2 hours as needed.  - Call if any new or changing lesions are noted between office visits  Return in about 6 months (around 08/08/2021) for AK follow up.  Graciella Belton, RMA, am acting as scribe for Brendolyn Patty, MD .  Documentation: I have reviewed the above documentation for accuracy and completeness, and I agree with the above.  Brendolyn Patty MD

## 2021-02-05 NOTE — Patient Instructions (Addendum)
Cryotherapy Aftercare  Wash gently with soap and water everyday.   Apply Vaseline and Band-Aid daily until healed.   Start ketoconazole 2% cream once daily to pink scaly patches at face, ears and scalp.  May continue HC 2.5% cream as needed for flares.  Continue Head & Shoulders shampoo.   If you have any questions or concerns for your doctor, please call our main line at [phone] and press option 4 to reach your doctor's medical assistant. If no one answers, please leave a voicemail as directed and we will return your call as soon as possible. Messages left after 4 pm will be answered the following business day.   You may also send [place] a message via [facility]. We typically respond to MyChart messages within 1-2 business days.  For prescription refills, please ask your pharmacy to contact our office. Our fax number is [phone].  If you have an urgent issue when the clinic is closed that cannot wait until the next business day, you can page your doctor at the number below.    Please note that while we do our best to be available for urgent issues outside of office hours, we are not available 24/7.   If you have an urgent issue and are unable to reach [place], you may choose to seek medical care at your doctor's office, retail clinic, urgent care center, or emergency room.  If you have a medical emergency, please immediately call 911 or go to the emergency department.  Pager Numbers  - Dr. Nehemiah Massed: [phone]  - Dr. Laurence Ferrari: [phone]  - Dr. Nicole Kindred: [phone]  In the event of inclement weather, please call our main line at [phone] for an update on the status of any delays or closures.  Dermatology Medication Tips: Please keep the boxes that topical medications come in in order to help keep track of the instructions about where and how to use these. Pharmacies typically print the medication instructions only on the boxes and not directly on the medication tubes.   If your  medication is too expensive, please contact our office at [phone] option 4 or send [place] a message through [facility].   We are unable to tell what your co-pay for medications will be in advance as this is different depending on your insurance coverage. However, we may be able to find a substitute medication at lower cost or fill out paperwork to get insurance to cover a needed medication.   If a prior authorization is required to get your medication covered by your insurance company, please allow [place] 1-2 business days to complete this process.  Drug prices often vary depending on where the prescription is filled and some pharmacies may offer cheaper prices.  The website www.goodrx.com contains coupons for medications through different pharmacies. The prices here do not account for what the cost may be with help from insurance (it may be cheaper with your insurance), but the website can give you the price if you did not use any insurance.  - You can print the associated coupon and take it with your prescription to the pharmacy.  - You may also stop by our office during regular business hours and pick up a GoodRx coupon card.  - If you need your prescription sent electronically to a different pharmacy, notify our office through [facility] or by phone at [phone] option 4.

## 2021-02-19 ENCOUNTER — Other Ambulatory Visit: Payer: Self-pay

## 2021-02-19 ENCOUNTER — Ambulatory Visit (INDEPENDENT_AMBULATORY_CARE_PROVIDER_SITE_OTHER): Payer: Medicare Other | Admitting: Cardiovascular Disease

## 2021-02-19 ENCOUNTER — Encounter: Payer: Self-pay | Admitting: Cardiovascular Disease

## 2021-02-19 VITALS — BP 120/60 | HR 62 | Ht 69.5 in | Wt 188.0 lb

## 2021-02-19 DIAGNOSIS — G20 Parkinson's disease: Secondary | ICD-10-CM | POA: Diagnosis not present

## 2021-02-19 DIAGNOSIS — Z9181 History of falling: Secondary | ICD-10-CM

## 2021-02-19 DIAGNOSIS — I739 Peripheral vascular disease, unspecified: Secondary | ICD-10-CM

## 2021-02-19 DIAGNOSIS — I350 Nonrheumatic aortic (valve) stenosis: Secondary | ICD-10-CM

## 2021-02-19 DIAGNOSIS — E782 Mixed hyperlipidemia: Secondary | ICD-10-CM

## 2021-02-19 DIAGNOSIS — I5032 Chronic diastolic (congestive) heart failure: Secondary | ICD-10-CM

## 2021-02-19 DIAGNOSIS — I10 Essential (primary) hypertension: Secondary | ICD-10-CM

## 2021-02-19 DIAGNOSIS — E1159 Type 2 diabetes mellitus with other circulatory complications: Secondary | ICD-10-CM

## 2021-02-19 DIAGNOSIS — I480 Paroxysmal atrial fibrillation: Secondary | ICD-10-CM

## 2021-02-19 NOTE — Progress Notes (Signed)
Cardiology Office Note  Date:  02/19/2021   ID:  Timothy [name], DOB 1928/10/13, MRN [id number]  PCP:  Orvis Brill, [facility]   Chief Complaint  Patient presents with   6 month follow up     "Doing well." Medications reviewed by the daughter [name]).    HPI:  Mr. Arnhold is a  87 year-old gentleman with a history of  Hyperlipidemia, hypertension,  diabetes,  Severe aortic valve stenosis. Atrial fibrillation seen on event monitor, on anticoagulation , bilateral popliteal artery aneurysms, B/l popliteal bypass in 12/2012 and 05/2013 for 1.7 cm aneurysm popliteal Back surgery 2016 He presents today for follow-up of his high cholesterol, PAD, Parkinsons, severe aortic valve stenosis  paroxysmal atrial fibrillation  Presents today with daughter In the ER 01/05/2021 lightheadedness and generalized weakness that occurred while he was sitting at breakfast. CR 1.26  In the ER 10/27/2020 new syncopal episode while trying to have a BM in the toilet today  feeling dizzy   Does not drink a lot of fluids  Periodic episodes of near syncope Lives at [facility] Has visiting nurse daily in the [place]  Notes reviewed from [facility] concerning TAVR "Given his poor functional status with advanced Parkinson's disease and advanced age, I do not think he is a candidate for TAVR"  Prior records reviewed Echo: 08/2020 Ef normal severe aortic valve  stenosis. Aortic valve area, by  VTI measures 0.87 cm. Aortic valve mean gradient measures 40.0 mmHg.  Aortic valve Vmax measures 4.13 m/s.   Covid in 2021 Required rehab,  EKG personally reviewed by myself on todays visit Shows normal sinus rhythm rate 62 bpm left axis deviation right bundle branch block  Other past medical history reviewed  emergency room February 17, 2019 for near syncope Vasovagal episode on the toilet  Prior lab work reviewed [facility] 7.7  Fall 02/16/2017 Hit head, concusion  surgery at [facility] in June 2016 with postoperative  complications including ileus, atrial fibrillation, congestive heart failure/swelling. spent several weeks at rehabilitation.    12/14/2012 he had popliteal aneurysm surgery at [facility]. Other leg was done 05/21/2013.    PMH:   has a past medical history of (HFpEF) heart failure with preserved ejection fraction ([place]), AAA (abdominal aortic aneurysm) ([place]), Actinic keratosis, Back pain, Basal cell carcinoma (08/22/2019), Basal cell carcinoma (04/30/2020), Esophageal reflux, Essential hypertension, History of stress test, Mixed hyperlipidemia, Moderate aortic stenosis, Osteitis deformans without mention of bone tumor, PAD (peripheral artery disease) ([place]), Parkinson's disease ([place]), Persistent atrial fibrillation ([place]), Senile osteoporosis, Skin cancer, Squamous cell carcinoma of skin (04/02/2017), Swelling of right lower extremity, TIA (transient ischemic attack), and Type II diabetes mellitus ([place]).  PSH:    Past Surgical History:  Procedure Laterality Date   APPENDECTOMY     BACK SURGERY     CATARACT EXTRACTION     COLONOSCOPY  2006,2009   ESOPHAGEAL DILATION     GALLBLADDER SURGERY     HERNIA REPAIR     Lower extremity arterial bypass Bilateral    RETINAL DETACHMENT SURGERY     TONSILLECTOMY      Current Outpatient Medications  Medication Sig Dispense Refill   acetaminophen (TYLENOL) 325 MG tablet Take 2 tablets (650 mg total) by mouth every 6 (six) hours as needed for mild pain or headache (fever >/= 101).     Calcium Citrate-Vitamin D (CALCIUM + D PO) Take 1 tablet by mouth daily.      carbidopa-levodopa (SINEMET IR) 25-100 MG tablet Take 3 tablets by  mouth 4 (four) times daily.     cyanocobalamin (,VITAMIN B-12,) 1000 MCG/ML injection INJECT 1ML ONCE A WEEK FOR 14 DAYS (2 WEEKS)  and  THEN RETURN TO MONTHLY FREQUENCY     ELIQUIS 5 MG TABS tablet TAKE 1 TABLET BY MOUTH TWICE DAILY 60 tablet 5   famotidine (PEPCID) 20 MG tablet Take 20 mg by mouth at bedtime.     FARXIGA 10 MG  TABS tablet TAKE 1 TABLET BY MOUTH DAILY 60 tablet 0   flecainide (TAMBOCOR) 50 MG tablet TAKE 1 TABLET BY MOUTH TWICE DAILY 60 tablet 3   glipiZIDE-metformin (METAGLIP) 5-500 MG tablet TAKE 1 TABLET BY MOUTH TWICE DAILY 60 tablet 0   GLUCOSAMINE-CHONDROITIN PO Take by mouth daily.     hydrocortisone 2.5 % cream Apply 1-2 times daily to areas at neck, face, scalp as needed for itch. 30 g 2   JANUVIA 100 MG tablet TAKE 1 TABLET BY MOUTH DAILY 90 tablet 0   ketoconazole (NIZORAL) 2 % cream once daily to pink scaly patches at face, ears and scalp. 60 g 2   metoprolol succinate (TOPROL-XL) 50 MG 24 hr tablet TAKE 1 TABLET BY MOUTH DAILY 30 tablet 2   Multiple Vitamins-Minerals (CEROVITE SENIOR PO) Take 1 tablet by mouth daily.     pantoprazole (PROTONIX) 40 MG tablet TAKE 1 TABLET BY MOUTH TWICE DAILY 180 tablet 4   rosuvastatin (CRESTOR) 10 MG tablet TAKE 1 TABLET BY MOUTH DAILY 30 tablet 2   sertraline (ZOLOFT) 50 MG tablet Take 1 tablet (50 mg total) by mouth daily. 90 tablet 4   STOOL SOFTENER 100 MG capsule Take 100 mg by mouth daily.     UNABLE TO FIND Take 100 mg by mouth daily. CBD '100mg'$      UNABLE TO FIND Med Name: [place]     No current facility-administered medications for this visit.     Allergies:   Patient has no known allergies.   Social History:  The patient  reports that he has never smoked. He has never used smokeless tobacco. He reports that he does not drink alcohol and does not use drugs.   Family History:   family history includes Diabetes in his father and another family member; Heart disease in his brother and mother; Stroke in his brother.    Review of Systems: Review of Systems  Constitutional: Negative.   HENT: Negative.    Respiratory: Negative.    Cardiovascular: Negative.   Gastrointestinal: Negative.   Musculoskeletal:        Unsteady gait, leg weakness  Neurological: Negative.   Psychiatric/Behavioral: Negative.    All other systems  reviewed and are negative.  PHYSICAL EXAM: VS:  BP 120/60 (BP Location: Left Arm, Patient Position: Sitting, Cuff Size: Normal)   Pulse 62   Ht 5' 9.5" (1.765 m)   Wt 188 lb (85.3 kg)   SpO2 98%   BMI 27.36 kg/m  , BMI Body mass index is 27.36 kg/m. Constitutional:  oriented to person, place, and time. No distress.  In a wheelchair, soft-spoken HENT:  Head: Grossly normal Eyes:  no discharge. No scleral icterus.  Neck: No JVD, no carotid bruits  Cardiovascular: Regular rate and rhythm, no murmurs appreciated Pulmonary/Chest: Clear to auscultation bilaterally, no wheezes or rails Abdominal: Soft.  no distension.  no tenderness.  Musculoskeletal: Normal range of motion Neurological:  normal muscle tone. Coordination normal. No atrophy Skin: Skin warm and dry Psychiatric: normal affect, pleasant   Recent  Labs: 10/27/2020: Magnesium 1.9 01/05/2021: ALT <5; BUN 15; Creatinine, Ser 1.26; Hemoglobin 11.0; Platelets 175; Potassium 3.9; Sodium 136    Lipid Panel Lab Results  Component Value Date   CHOL 116 12/10/2017   HDL 32 (L) 12/10/2017   LDLCALC 21 12/10/2017   TRIG 317 (H) 12/10/2017    Wt Readings from Last 3 Encounters:  02/19/21 188 lb (85.3 kg)  01/05/21 180 lb (81.6 kg)  10/27/20 180 lb (81.6 kg)     ASSESSMENT AND PLAN:  Severe aortic valve stenosis Gradient within severe range, "Given his poor functional status with advanced Parkinson's disease and advanced age, I do not think he is a candidate for TAVR"  Paroxysmal atrial fibrillation ([place])  Maintaining normal sinus rhythm, on anticoagulation, beta-blocker No recent falls  AAA (abdominal aortic aneurysm) without rupture (HCC) 3.7 cm July 2019 We will need periodic evaluation  Essential hypertension Blood pressure is well controlled on today's visit. No changes made to the medications.  Type 2 diabetes mellitus with other circulatory complication, without long-term current use of insulin  (HCC) Typically runs high 8.5 most recently "[facility] house calls" managing Weight has been stable  Parkinson's disease ([place]) Soft spoken, no recent falls On Sinemet,  Legs weak in general Using a walker  Peripheral arterial disease ([place]) Bilateral popliteal aneurysm,  bilateral bypass surgery at [facility] Disease of anterior tibial vessels,  Denies claudication symptoms Minimally active  Aortic valve stenosis  Severe aortic valve stenosis on recent echocardiogram, discussed gradients, estimated    Total encounter time more than 25 minutes  Greater than 50% was spent in counseling and coordination of care with the patient   No orders of the defined types were placed in this encounter.    Signed, Esmond Plants, M.D., Ph.D. 02/19/2021  [place], [place]

## 2021-02-19 NOTE — Patient Instructions (Addendum)
Stay hydrated  Medication Instructions:  No changes  If you need a refill on your cardiac medications before your next appointment, please call your pharmacy.   Lab work: No new labs needed  Testing/Procedures: No new testing needed  Follow-Up: At [facility], you and your health needs are our priority.  As part of our continuing mission to provide you with exceptional heart care, we have created designated Provider Care Teams.  These Care Teams include your primary Cardiologist (physician) and Advanced Practice Providers (APPs -  Physician Assistants and Nurse Practitioners) who all work together to provide you with the care you need, when you need it.  You will need a follow up appointment in 12 months  Providers on your designated Care Team:   Murray Hodgkins, NP Christell Faith, PA-C Marrianne Mood, PA-C Cadence McDonald, [place]  COVID-19 Vaccine Information can be found at: [email or web address] For questions related to vaccine distribution or appointments, please email [email or web address] or call [phone].

## 2021-03-05 ENCOUNTER — Other Ambulatory Visit: Payer: Self-pay | Admitting: Nurse Practitioner

## 2021-03-05 DIAGNOSIS — R1310 Dysphagia, unspecified: Secondary | ICD-10-CM

## 2021-03-05 NOTE — Telephone Encounter (Signed)
Requested medications are due for refill today.  yes  Requested medications are on the active medications list.  yes  Last refill. 03/09/2020  Future visit scheduled.   no  Notes to clinic.  PCP is listed as Patent attorney".

## 2021-03-09 ENCOUNTER — Encounter: Payer: Self-pay | Admitting: Emergency Medicine

## 2021-03-09 ENCOUNTER — Other Ambulatory Visit: Payer: Self-pay

## 2021-03-09 ENCOUNTER — Emergency Department
Admission: EM | Admit: 2021-03-09 | Discharge: 2021-03-09 | Disposition: A | Discharge status: Home / Self Care | Payer: Medicare Other | Attending: Emergency Medicine | Admitting: Emergency Medicine

## 2021-03-09 DIAGNOSIS — R55 Syncope and collapse: Secondary | ICD-10-CM | POA: Insufficient documentation

## 2021-03-09 DIAGNOSIS — I509 Heart failure, unspecified: Secondary | ICD-10-CM | POA: Insufficient documentation

## 2021-03-09 DIAGNOSIS — G20 Parkinson's disease: Secondary | ICD-10-CM | POA: Diagnosis not present

## 2021-03-09 DIAGNOSIS — Z7984 Long term (current) use of oral hypoglycemic drugs: Secondary | ICD-10-CM | POA: Insufficient documentation

## 2021-03-09 DIAGNOSIS — Z79899 Other long term (current) drug therapy: Secondary | ICD-10-CM | POA: Insufficient documentation

## 2021-03-09 DIAGNOSIS — Z8616 Personal history of COVID-19: Secondary | ICD-10-CM | POA: Diagnosis not present

## 2021-03-09 DIAGNOSIS — I350 Nonrheumatic aortic (valve) stenosis: Secondary | ICD-10-CM | POA: Diagnosis not present

## 2021-03-09 DIAGNOSIS — Z7901 Long term (current) use of anticoagulants: Secondary | ICD-10-CM | POA: Diagnosis not present

## 2021-03-09 DIAGNOSIS — Z85828 Personal history of other malignant neoplasm of skin: Secondary | ICD-10-CM | POA: Insufficient documentation

## 2021-03-09 DIAGNOSIS — I110 Hypertensive heart disease with heart failure: Secondary | ICD-10-CM | POA: Diagnosis not present

## 2021-03-09 DIAGNOSIS — E119 Type 2 diabetes mellitus without complications: Secondary | ICD-10-CM | POA: Insufficient documentation

## 2021-03-09 LAB — CBC WITH DIFFERENTIAL/PLATELET
Abs Immature Granulocytes: 0.05 10*3/uL (ref 0.00–0.07)
Basophils Absolute: 0.1 10*3/uL (ref 0.0–0.1)
Basophils Relative: 1 %
Eosinophils Absolute: 0.4 10*3/uL (ref 0.0–0.5)
Eosinophils Relative: 6 %
HCT: 32.8 % — ABNORMAL LOW (ref 39.0–52.0)
Hemoglobin: 10.4 g/dL — ABNORMAL LOW (ref 13.0–17.0)
Immature Granulocytes: 1 %
Lymphocytes Relative: 29 %
Lymphs Abs: 2.2 10*3/uL (ref 0.7–4.0)
MCH: 29.5 pg (ref 26.0–34.0)
MCHC: 31.7 g/dL (ref 30.0–36.0)
MCV: 93.2 fL (ref 80.0–100.0)
Monocytes Absolute: 0.9 10*3/uL (ref 0.1–1.0)
Monocytes Relative: 12 %
Neutro Abs: 3.9 10*3/uL (ref 1.7–7.7)
Neutrophils Relative %: 51 %
Platelets: 199 10*3/uL (ref 150–400)
RBC: 3.52 MIL/uL — ABNORMAL LOW (ref 4.22–5.81)
RDW: 15.4 % (ref 11.5–15.5)
WBC: 7.5 10*3/uL (ref 4.0–10.5)
nRBC: 0 % (ref 0.0–0.2)

## 2021-03-09 LAB — BASIC METABOLIC PANEL
Anion gap: 9 (ref 5–15)
BUN: 17 mg/dL (ref 8–23)
CO2: 22 mmol/L (ref 22–32)
Calcium: 8.3 mg/dL — ABNORMAL LOW (ref 8.9–10.3)
Chloride: 105 mmol/L (ref 98–111)
Creatinine, Ser: 1.16 mg/dL (ref 0.61–1.24)
GFR, Estimated: >60 mL/min (ref >60)
Glucose, Bld: 237 mg/dL — ABNORMAL HIGH (ref 70–99)
Potassium: 4.2 mmol/L (ref 3.5–5.1)
Sodium: 136 mmol/L (ref 135–145)

## 2021-03-09 LAB — TROPONIN I (HIGH SENSITIVITY)
Troponin I (High Sensitivity): 9 ng/L (ref <18)
Troponin I (High Sensitivity): 9 ng/L (ref <18)

## 2021-03-09 NOTE — ED Triage Notes (Signed)
Pt arrives via EMS from [facility] with near syncopal event on the toilet while trying to have a BM. EMS reports pt had loose stools and initial BP 84/41.  CBG 266 HR 56 BP after fluids 113/58

## 2021-03-09 NOTE — ED Provider Notes (Signed)
[facility] Emergency Department Provider Note   ____________________________________________   Event Date/Time   First MD Initiated Contact with Patient 03/09/21 1033     (approximate)  I have reviewed the triage vital signs and the nursing notes.   HISTORY  Chief Complaint Near Syncope    HPI SHYHIEM SUDDARTH is a 87 y.o. male with past medical history of hypertension, hyperlipidemia, diabetes, atrial fibrillation on Eliquis, PAD, Parkinson disease, AAA, and aortic stenosis who presents to the ED complaining of near syncope.  Per EMS, patient was straining for a bowel movement just prior to arrival when he started to feel very lightheaded and like he was going to pass out.  He never fully lost consciousness, ended up having a loose stool at the time of EMS arrival.  EMS initially found patient with low BP and he was given an IV fluid bolus with improvement.  Patient now states he feels back to normal, denies any associated chest pain or shortness of breath with the episode today.  He reports feeling well recently with no fevers, cough, abdominal pain, nausea, or vomiting.        Past Medical History:  Diagnosis Date   (HFpEF) heart failure with preserved ejection fraction ([place])    a. 05/2019 Echo: EF 65-70%, mod LVH. Gr2 DD. Mod AS.   AAA (abdominal aortic aneurysm) ([place])    a. 2014 Abd U/S: 3.5cm AAA; b. 10/20189 Abd U/S: 3.3cm AAA; c. 01/2018 CT Abd/Pelvis: 2.7cm infrarenal AAA - rec f/u U/S in 5 yrs.   Actinic keratosis    Back pain    Basal cell carcinoma 08/22/2019   Left mid ear helix. Nodular pattern   Basal cell carcinoma 04/30/2020   Left cheek. Superficial and nodular patterns.   Esophageal reflux    Essential hypertension    History of stress test    a. 01/2012 MV: EF 63%, no rwma. No ischemia/infarct. Low risk.   Mixed hyperlipidemia    Moderate aortic stenosis    a. 05/2019 Echo: Mod AS. Mean grad 29.5 mmHg. Peak grad 55.7 mmHg. AVA 1.28  cm^2 (VTI). No significant change from 2019 echo.   Osteitis deformans without mention of bone tumor    Paget's Disease   PAD (peripheral artery disease) ([place])    a. 12/2012 and 05/2013 s/p bilat LE bypass @ [facility] in setting of bilat popliteal aneurysms; b. 05/2019 LE Duplex: R PT 50-74%, Nl L Leg.   Parkinson's disease ([place])    Persistent atrial fibrillation ([place])    a. 02/2015 Event monitor: long periods of Afib--prev on flecainide; b. CHA2DS2VASc = 8-->Eliquis 5 bid.   Senile osteoporosis    Skin cancer    Squamous cell carcinoma of skin 04/02/2017   Left cheek. SCCis. Mohs at [facility] 2018   Swelling of right lower extremity    a. 05/2019 LE U/S: No DVT. 50-64% R PT artery stenosis.   TIA (transient ischemic attack)    Type II diabetes mellitus [facility])     Patient Active Problem List   Diagnosis Date Noted   Acute respiratory failure with hypoxia ([place])    Pneumonia due to COVID-19 virus 09/18/2019   PAD (peripheral artery disease) (HCC)    Moderate aortic stenosis    AAA (abdominal aortic aneurysm) (HCC)    (HFpEF) heart failure with preserved ejection fraction ([place])    Type 2 diabetes mellitus without complication, without long-term current use of insulin ([place])    Parkinson disease ([place]) 06/14/2018  Behavioral change 03/25/2018   Aortic valve stenosis 02/18/2018   Dysphagia 12/10/2017   Advanced care planning/counseling discussion 11/27/2016   Pernicious anemia 05/28/2015   Leg weakness, bilateral 04/12/2015   Tremor 03/27/2015   Chronic anxiety 02/20/2015   SVT (supraventricular tachycardia) ([place]) 02/16/2015   AF (paroxysmal atrial fibrillation) ([place]) 02/16/2015   History of back surgery 02/16/2015   TIA (transient ischemic attack) 12/29/2011   Hyperlipidemia associated with type 2 diabetes mellitus ([place]) 09/24/2011   Essential hypertension 09/24/2011    Past Surgical History:  Procedure Laterality Date   APPENDECTOMY     BACK SURGERY     CATARACT EXTRACTION      COLONOSCOPY  2006,2009   ESOPHAGEAL DILATION     GALLBLADDER SURGERY     HERNIA REPAIR     Lower extremity arterial bypass Bilateral    RETINAL DETACHMENT SURGERY     TONSILLECTOMY      Prior to Admission medications   Medication Sig Start Date End Date Taking? Authorizing Provider  acetaminophen (TYLENOL) 325 MG tablet Take 2 tablets (650 mg total) by mouth every 6 (six) hours as needed for mild pain or headache (fever >/= 101). 09/22/19   Val Riles, MD  Calcium Citrate-Vitamin D (CALCIUM + D PO) Take 1 tablet by mouth daily.     [provider]  carbidopa-levodopa (SINEMET IR) 25-100 MG tablet Take 3 tablets by mouth 4 (four) times daily.    [provider]  cyanocobalamin (,VITAMIN B-12,) 1000 MCG/ML injection INJECT 1ML ONCE A WEEK FOR 14 DAYS (2 WEEKS)  and  THEN RETURN TO MONTHLY FREQUENCY 06/21/20   [provider]  ELIQUIS 5 MG TABS tablet TAKE 1 TABLET BY MOUTH TWICE DAILY 03/09/20   Cannady, Jolene T, NP  famotidine (PEPCID) 20 MG tablet Take 20 mg by mouth at bedtime. 06/07/20   [provider]  FARXIGA 10 MG TABS tablet TAKE 1 TABLET BY MOUTH DAILY 05/07/20   Cannady, Henrine Screws T, NP  flecainide (TAMBOCOR) 50 MG tablet TAKE 1 TABLET BY MOUTH TWICE DAILY 11/05/20   [name], MD  glipiZIDE-metformin (METAGLIP) 5-500 MG tablet TAKE 1 TABLET BY MOUTH TWICE DAILY 08/09/20   Cannady, Henrine Screws T, NP  GLUCOSAMINE-CHONDROITIN PO Take by mouth daily.    [provider]  hydrocortisone 2.5 % cream Apply 1-2 times daily to areas at neck, face, scalp as needed for itch. 04/30/20   Brendolyn Patty, MD  JANUVIA 100 MG tablet TAKE 1 TABLET BY MOUTH DAILY 05/07/20   Cannady, Henrine Screws T, NP  ketoconazole (NIZORAL) 2 % cream once daily to pink scaly patches at face, ears and scalp. 02/05/21   Brendolyn Patty, MD  metoprolol succinate (TOPROL-XL) 50 MG 24 hr tablet TAKE 1 TABLET BY MOUTH DAILY 03/09/20   Cannady, Henrine Screws T, NP  Multiple Vitamins-Minerals (CEROVITE  SENIOR PO) Take 1 tablet by mouth daily.    [provider]  pantoprazole (PROTONIX) 40 MG tablet TAKE 1 TABLET BY MOUTH TWICE DAILY 03/09/20   Cannady, Jolene T, NP  rosuvastatin (CRESTOR) 10 MG tablet TAKE 1 TABLET BY MOUTH DAILY 03/09/20   Cannady, Jolene T, NP  sertraline (ZOLOFT) 50 MG tablet Take 1 tablet (50 mg total) by mouth daily. 11/17/19   Cannady, Henrine Screws T, NP  STOOL SOFTENER 100 MG capsule Take 100 mg by mouth daily. 10/12/20   [provider]  UNABLE TO FIND Take 100 mg by mouth daily. CBD '100mg'$     [provider]  UNABLE TO  FIND Med Name: [facility]    [provider]    Allergies Patient has no known allergies.  Family History  Problem Relation Age of Onset   Heart disease Mother    Diabetes Father    Diabetes Other        grandparents   Heart disease Brother    Stroke Brother     Social History Social History   Tobacco Use   Smoking status: Never   Smokeless tobacco: Never  Vaping Use   Vaping Use: Never used  Substance Use Topics   Alcohol use: No   Drug use: No    Review of Systems  Constitutional: No fever/chills Eyes: No visual changes. ENT: No sore throat. Cardiovascular: Denies chest pain.  Positive for lightheadedness and near syncope. Respiratory: Denies shortness of breath. Gastrointestinal: No abdominal pain.  No nausea, no vomiting.  Positive for diarrhea.  No constipation. Genitourinary: Negative for dysuria. Musculoskeletal: Negative for back pain. Skin: Negative for rash. Neurological: Negative for headaches, focal weakness or numbness.  ____________________________________________   PHYSICAL EXAM:  VITAL SIGNS: ED Triage Vitals  Enc Vitals Group     BP      Pulse      Resp      Temp      Temp src      SpO2      Weight      Height      Head Circumference      Peak Flow      Pain Score      Pain Loc      Pain Edu?      Excl. in [place]?     Constitutional: Alert and  oriented. Eyes: Conjunctivae are normal. Head: Atraumatic. Nose: No congestion/rhinnorhea. Mouth/Throat: Mucous membranes are moist. Neck: Normal ROM Cardiovascular: Normal rate, regular rhythm.  Systolic murmur noted.  2+ radial pulses bilaterally. Respiratory: Normal respiratory effort.  No retractions. Lungs CTAB. Gastrointestinal: Soft and nontender. No distention. Genitourinary: deferred Musculoskeletal: No lower extremity tenderness nor edema. Neurologic:  Normal speech and language. No gross focal neurologic deficits are appreciated. Skin:  Skin is warm, dry and intact. No rash noted. Psychiatric: Mood and affect are normal. Speech and behavior are normal.  ____________________________________________   LABS (all labs ordered are listed, but only abnormal results are displayed)  Labs Reviewed  CBC WITH DIFFERENTIAL/PLATELET - Abnormal; Notable for the following components:      Result Value   RBC 3.52 (*)    Hemoglobin 10.4 (*)    HCT 32.8 (*)    All other components within normal limits  BASIC METABOLIC PANEL - Abnormal; Notable for the following components:   Glucose, Bld 237 (*)    Calcium 8.3 (*)    All other components within normal limits  TROPONIN I (HIGH SENSITIVITY)  TROPONIN I (HIGH SENSITIVITY)   ____________________________________________  EKG  ED ECG REPORT I, Blake Divine, the attending physician, personally viewed and interpreted this ECG.   Date: 03/09/2021  EKG Time: 10:35  Rate: 57  Rhythm: normal sinus rhythm  Axis: LAD  Intervals:right bundle branch block and left anterior fascicular block  ST&T Change: None   PROCEDURES  Procedure(s) performed (including Critical Care):  Procedures   ____________________________________________   INITIAL IMPRESSION / ASSESSMENT AND PLAN / ED COURSE      87 year old male with past medical history of hypertension, hyperlipidemia, diabetes, atrial fibrillation on Eliquis, aortic stenosis,  AAA, peripheral arterial disease, and Parkinson disease who presents  to the ED following near syncopal episode while straining for a bowel movement just prior to arrival.  He denies any chest pain or shortness of breath, EKG shows no evidence of arrhythmia or acute ischemia, does show bifascicular block similar to previous.  Patient now feels back to normal and BP is improved, suspect vasovagal episode contributing to his symptoms, although he does have significant known aortic stenosis which could have contributed.  We will observe on cardiac monitor and screen labs including troponin.  He has previously been determined to not be a candidate for aortic valve replacement.  If work-up is unremarkable, he would be appropriate for discharge home with cardiology follow-up.  2 sets of troponin are negative, no events noted on cardiac monitor.  Patient continues to report that he feels well and with the episode occurring during straining for a bowel movement, suspect vasovagal near syncope.  He is appropriate for discharge home with cardiology follow-up, plan discussed with son over the phone, who agrees.      ____________________________________________   FINAL CLINICAL IMPRESSION(S) / ED DIAGNOSES  Final diagnoses:  Near syncope  Vasovagal episode  Aortic valve stenosis, etiology of cardiac valve disease unspecified     ED Discharge Orders     None        Note:  This document was prepared using Dragon voice recognition software and may include unintentional dictation errors.    Blake Divine, MD 03/09/21 [id number]

## 2021-03-09 NOTE — ED Notes (Signed)
Pt's daughter updated with pt's consent.

## 2021-03-09 NOTE — ED Notes (Signed)
Pt helped to side of bed to use urinal with one assist.

## 2021-03-09 NOTE — ED Notes (Signed)
Pt's daughter called for pt dc

## 2021-03-09 NOTE — ED Notes (Addendum)
Pt departed with family. Pt is in independent living. Discharge instructions communicated to daughter by Orson Gear.

## 2021-03-18 ENCOUNTER — Telehealth: Payer: Self-pay | Admitting: Cardiovascular Disease

## 2021-03-18 NOTE — Telephone Encounter (Signed)
Declined ED fu visit.  Per Daughter on DPR not needed .

## 2021-04-04 ENCOUNTER — Other Ambulatory Visit: Payer: Self-pay | Admitting: Cardiovascular Disease

## 2021-05-08 ENCOUNTER — Other Ambulatory Visit: Payer: Self-pay

## 2021-05-08 ENCOUNTER — Emergency Department
Admission: EM | Admit: 2021-05-08 | Discharge: 2021-05-08 | Disposition: A | Discharge status: Home / Self Care | Payer: Medicare Other | Attending: Emergency Medicine | Admitting: Emergency Medicine

## 2021-05-08 ENCOUNTER — Emergency Department: Payer: Medicare Other

## 2021-05-08 ENCOUNTER — Encounter: Payer: Self-pay | Admitting: Emergency Medicine

## 2021-05-08 DIAGNOSIS — S2241XA Multiple fractures of ribs, right side, initial encounter for closed fracture: Secondary | ICD-10-CM | POA: Diagnosis not present

## 2021-05-08 DIAGNOSIS — Z7901 Long term (current) use of anticoagulants: Secondary | ICD-10-CM | POA: Insufficient documentation

## 2021-05-08 DIAGNOSIS — I509 Heart failure, unspecified: Secondary | ICD-10-CM | POA: Diagnosis not present

## 2021-05-08 DIAGNOSIS — Z7984 Long term (current) use of oral hypoglycemic drugs: Secondary | ICD-10-CM | POA: Insufficient documentation

## 2021-05-08 DIAGNOSIS — S299XXA Unspecified injury of thorax, initial encounter: Secondary | ICD-10-CM | POA: Diagnosis present

## 2021-05-08 DIAGNOSIS — Z79899 Other long term (current) drug therapy: Secondary | ICD-10-CM | POA: Insufficient documentation

## 2021-05-08 DIAGNOSIS — I110 Hypertensive heart disease with heart failure: Secondary | ICD-10-CM | POA: Insufficient documentation

## 2021-05-08 DIAGNOSIS — I480 Paroxysmal atrial fibrillation: Secondary | ICD-10-CM | POA: Insufficient documentation

## 2021-05-08 DIAGNOSIS — W1830XA Fall on same level, unspecified, initial encounter: Secondary | ICD-10-CM | POA: Diagnosis not present

## 2021-05-08 DIAGNOSIS — G20 Parkinson's disease: Secondary | ICD-10-CM | POA: Diagnosis not present

## 2021-05-08 DIAGNOSIS — Z85828 Personal history of other malignant neoplasm of skin: Secondary | ICD-10-CM | POA: Insufficient documentation

## 2021-05-08 DIAGNOSIS — E119 Type 2 diabetes mellitus without complications: Secondary | ICD-10-CM | POA: Insufficient documentation

## 2021-05-08 DIAGNOSIS — Z8616 Personal history of COVID-19: Secondary | ICD-10-CM | POA: Insufficient documentation

## 2021-05-08 MED ORDER — TRAMADOL HCL 50 MG PO TABS
50.0000 mg | ORAL_TABLET | Freq: Once | ORAL | Status: AC
  Administered 2021-05-08: 50 mg via ORAL
  Filled 2021-05-08: qty 1

## 2021-05-08 MED ORDER — TRAMADOL HCL 50 MG PO TABS: 50.0000 mg | ORAL_TABLET | Freq: Four times a day (QID) | ORAL | 0 refills | Status: DC | PRN

## 2021-05-08 NOTE — ED Notes (Signed)
See triage note, pt reports tripping and falling halloween night. Blood thinner use. Reports back pain and right rib pain. NAD noted Family sitting with pt

## 2021-05-08 NOTE — Discharge Instructions (Signed)
As we discussed please use 650 mg of Tylenol every 8 hours as needed for pain or discomfort.  You have also been prescribed tramadol, please take 1 tablet every 8 hours if needed for severe pain.  Please use your incentive spirometer several times per hour while awake for the next 10 days to help prevent pneumonia.  Return to the emergency department for any worsening pain, trouble breathing, fever or any other symptom personally concerning to yourself.  We have also filled out forms to help get physical therapy as we believe you would benefit from physical therapy and your home.

## 2021-05-08 NOTE — ED Triage Notes (Signed)
Pt comes into the ED via [facility] from [place] c/o fall on Halloween night.  Pt states he is unsure if he hit his head or not.  Pt is currently on blood thinners.  EMS responded the night of and the patient declined transport.  Pt states he had no pain then, but starting last night his right side starting hurting.  He explains that he fell on the edge of the bath tub when he fell.  Pt in NAD at this time with even and unlabored respirations.

## 2021-05-08 NOTE — ED Provider Notes (Signed)
[facility] Emergency Department Provider Note  Time seen: 12:27 PM  I have reviewed the triage vital signs and the nursing notes.   HISTORY  Chief Complaint Fall   HPI Timothy [name] is a 87 y.o. male with a past medical history of CHF, hypertension, Parkinson's, diabetes, presents to the emergency department after a fall 2 days ago.  According to the patient on hollowing night he accidentally fell onto his right chest.  Does not believe he hit his head but is not sure.  No LOC, no headache.  Patient states he has been fairly pain-free but last night and today was experiencing more pain in the right chest so he came to the emergency department for evaluation.  Patient states minimal pain while at rest but states more significant pain when moving.   Past Medical History:  Diagnosis Date   (HFpEF) heart failure with preserved ejection fraction ([place])    a. 05/2019 Echo: EF 65-70%, mod LVH. Gr2 DD. Mod AS.   AAA (abdominal aortic aneurysm)    a. 2014 Abd U/S: 3.5cm AAA; b. 10/20189 Abd U/S: 3.3cm AAA; c. 01/2018 CT Abd/Pelvis: 2.7cm infrarenal AAA - rec f/u U/S in 5 yrs.   Actinic keratosis    Back pain    Basal cell carcinoma 08/22/2019   Left mid ear helix. Nodular pattern   Basal cell carcinoma 04/30/2020   Left cheek. Superficial and nodular patterns.   Esophageal reflux    Essential hypertension    History of stress test    a. 01/2012 MV: EF 63%, no rwma. No ischemia/infarct. Low risk.   Mixed hyperlipidemia    Moderate aortic stenosis    a. 05/2019 Echo: Mod AS. Mean grad 29.5 mmHg. Peak grad 55.7 mmHg. AVA 1.28 cm^2 (VTI). No significant change from 2019 echo.   Osteitis deformans without mention of bone tumor    Paget's Disease   PAD (peripheral artery disease) ([place])    a. 12/2012 and 05/2013 s/p bilat LE bypass @ [facility] in setting of bilat popliteal aneurysms; b. 05/2019 LE Duplex: R PT 50-74%, Nl L Leg.   Parkinson's disease ([place])    Persistent atrial  fibrillation ([place])    a. 02/2015 Event monitor: long periods of Afib--prev on flecainide; b. CHA2DS2VASc = 8-->Eliquis 5 bid.   Senile osteoporosis    Skin cancer    Squamous cell carcinoma of skin 04/02/2017   Left cheek. SCCis. Mohs at [facility] 2018   Swelling of right lower extremity    a. 05/2019 LE U/S: No DVT. 50-64% R PT artery stenosis.   TIA (transient ischemic attack)    Type II diabetes mellitus [facility])     Patient Active Problem List   Diagnosis Date Noted   Acute respiratory failure with hypoxia ([place])    Pneumonia due to COVID-19 virus 09/18/2019   PAD (peripheral artery disease) ([facility])    Moderate aortic stenosis    AAA (abdominal aortic aneurysm)    (HFpEF) heart failure with preserved ejection fraction ([place])    Type 2 diabetes mellitus without complication, without long-term current use of insulin ([place])    Parkinson disease ([place]) 06/14/2018   Behavioral change 03/25/2018   Aortic valve stenosis 02/18/2018   Dysphagia 12/10/2017   Advanced care planning/counseling discussion 11/27/2016   Pernicious anemia 05/28/2015   Leg weakness, bilateral 04/12/2015   Tremor 03/27/2015   Chronic anxiety 02/20/2015   SVT (supraventricular tachycardia) ([place]) 02/16/2015   AF (paroxysmal atrial fibrillation) ([place]) 02/16/2015   History  of back surgery 02/16/2015   TIA (transient ischemic attack) 12/29/2011   Hyperlipidemia associated with type 2 diabetes mellitus ([place]) 09/24/2011   Essential hypertension 09/24/2011    Past Surgical History:  Procedure Laterality Date   APPENDECTOMY     BACK SURGERY     CATARACT EXTRACTION     COLONOSCOPY  2006,2009   ESOPHAGEAL DILATION     GALLBLADDER SURGERY     HERNIA REPAIR     Lower extremity arterial bypass Bilateral    RETINAL DETACHMENT SURGERY     TONSILLECTOMY      Prior to Admission medications   Medication Sig Start Date End Date Taking? Authorizing Provider  acetaminophen (TYLENOL) 325 MG tablet Take 2 tablets (650 mg total) by  mouth every 6 (six) hours as needed for mild pain or headache (fever >/= 101). 09/22/19   Val Riles, MD  Calcium Citrate-Vitamin D (CALCIUM + D PO) Take 1 tablet by mouth daily.     [provider]  carbidopa-levodopa (SINEMET IR) 25-100 MG tablet Take 3 tablets by mouth 4 (four) times daily.    [provider]  cyanocobalamin (,VITAMIN B-12,) 1000 MCG/ML injection INJECT 1ML ONCE A WEEK FOR 14 DAYS (2 WEEKS)  and  THEN RETURN TO MONTHLY FREQUENCY 06/21/20   [provider]  ELIQUIS 5 MG TABS tablet TAKE 1 TABLET BY MOUTH TWICE DAILY 03/09/20   Cannady, Jolene T, NP  famotidine (PEPCID) 20 MG tablet Take 20 mg by mouth at bedtime. 06/07/20   [provider]  FARXIGA 10 MG TABS tablet TAKE 1 TABLET BY MOUTH DAILY 05/07/20   Cannady, Henrine Screws T, NP  flecainide (TAMBOCOR) 50 MG tablet TAKE 1 TABLET BY MOUTH TWICE DAILY 04/04/21   [name], MD  glipiZIDE-metformin (METAGLIP) 5-500 MG tablet TAKE 1 TABLET BY MOUTH TWICE DAILY 08/09/20   Cannady, Henrine Screws T, NP  GLUCOSAMINE-CHONDROITIN PO Take by mouth daily.    [provider]  hydrocortisone 2.5 % cream Apply 1-2 times daily to areas at neck, face, scalp as needed for itch. 04/30/20   Brendolyn Patty, MD  JANUVIA 100 MG tablet TAKE 1 TABLET BY MOUTH DAILY 05/07/20   Cannady, Henrine Screws T, NP  ketoconazole (NIZORAL) 2 % cream once daily to pink scaly patches at face, ears and scalp. 02/05/21   Brendolyn Patty, MD  metoprolol succinate (TOPROL-XL) 50 MG 24 hr tablet TAKE 1 TABLET BY MOUTH DAILY 03/09/20   Cannady, Henrine Screws T, NP  Multiple Vitamins-Minerals (CEROVITE SENIOR PO) Take 1 tablet by mouth daily.    [provider]  pantoprazole (PROTONIX) 40 MG tablet TAKE 1 TABLET BY MOUTH TWICE DAILY 03/09/20   Cannady, Jolene T, NP  rosuvastatin (CRESTOR) 10 MG tablet TAKE 1 TABLET BY MOUTH DAILY 03/09/20   Cannady, Jolene T, NP  sertraline (ZOLOFT) 50 MG tablet Take 1 tablet (50 mg total) by mouth daily. 11/17/19    Cannady, Henrine Screws T, NP  STOOL SOFTENER 100 MG capsule Take 100 mg by mouth daily. 10/12/20   [provider]  UNABLE TO FIND Take 100 mg by mouth daily. CBD 100mg     [provider]  Karen Kays TO FIND Med Name: [facility]    [provider]    No Known Allergies  Family History  Problem Relation Age of Onset   Heart disease Mother    Diabetes Father    Diabetes Other        grandparents   Heart disease Brother  Stroke Brother     Social History Social History   Tobacco Use   Smoking status: Never   Smokeless tobacco: Never  Vaping Use   Vaping Use: Never used  Substance Use Topics   Alcohol use: No   Drug use: No    Review of Systems Constitutional: Negative for fever. Cardiovascular: Right-sided chest pain Respiratory: Negative for shortness of breath.  No cough. Gastrointestinal: Negative for abdominal pain, vomiting Musculoskeletal: Right-sided chest wall pain Neurological: Negative for headache All other ROS negative  ____________________________________________   PHYSICAL EXAM:  VITAL SIGNS: ED Triage Vitals  Enc Vitals Group     BP 05/08/21 0928 128/62     Pulse Rate 05/08/21 0928 66     Resp 05/08/21 0928 17     Temp 05/08/21 0928 97.9 F (36.6 C)     Temp Source 05/08/21 0928 Oral     SpO2 05/08/21 0928 95 %     Weight 05/08/21 0929 188 lb 0.8 oz (85.3 kg)     Height 05/08/21 0929 5\' 9"  (1.753 m)     Head Circumference --      Peak Flow --      Pain Score 05/08/21 0928 8     Pain Loc --      Pain Edu? --      Excl. in [place]? --    Constitutional: Alert and oriented. Well appearing and in no distress. Eyes: Normal exam ENT      Head: Normocephalic and atraumatic.      Mouth/Throat: Mucous membranes are moist. Cardiovascular: Normal rate, regular rhythm.  Right-sided chest wall tenderness to palpation Respiratory: Normal respiratory effort without tachypnea nor retractions. Breath sounds are  clear Gastrointestinal: Soft and nontender. No distention.   Musculoskeletal: Nontender with normal range of motion in all extremities.  Right lower chest wall tenderness.  No obvious bruising/hematoma Neurologic:  Normal speech and language. No gross focal neurologic deficits  Skin:  Skin is warm, dry and intact.  Psychiatric: Mood and affect are normal    RADIOLOGY  Patient presents x-ray shows acute right seventh eighth and ninth rib fractures.  No acute lung findings CT scan of the C-spine is negative. My evaluation of the CT scan head does not appear to show any acute abnormality patient does have hydrocephalus awaiting official radiology read  ____________________________________________   INITIAL IMPRESSION / ASSESSMENT AND PLAN / ED COURSE  Pertinent labs & imaging results during my care of the patient were reviewed by me and considered in my medical decision making (see chart for details).   Patient presents emergency dept after mechanical fall 2 days ago landing onto his right side.  Patient has a history of Parkinson's and has some trouble ambulating at baseline.  Overall the patient appears well does have mild to moderate right chest wall tenderness to palpation but states when he is lying in bed it hurts minimally.  States the pain is somewhat worse with movement.  We will await CT read of the head.  As long as head is negative anticipate likely discharge home with pain control.  We will dose tramadol in the emergency department and then attempt to ambulate prior to discharge.  Patient CT scan of the head is negative as well.  Patient continues to appear well states no pain or mild pain currently after tramadol.  Patient's daughter is here with the patient states patient is largely wheelchair-bound but she believes she would benefit from physical therapy.  We will send  a message to our social worker to try to help to arrange physical therapy and fill out a face-to-face form for  the patient.  We will discharge her tramadol for pain control as well as Tylenol to be used if needed.  Daughter agreeable to plan of care.  We will also provide incentive spirometer for the patient.  DELONTA YOHANNES was evaluated in Emergency Department on 05/08/2021 for the symptoms described in the history of present illness. He was evaluated in the context of the global COVID-19 pandemic, which necessitated consideration that the patient might be at risk for infection with the SARS-CoV-2 virus that causes COVID-19. Institutional protocols and algorithms that pertain to the evaluation of patients at risk for COVID-19 are in a state of rapid change based on information released by regulatory bodies including the CDC and federal and state organizations. These policies and algorithms were followed during the patient's care in the ED.  ____________________________________________   FINAL CLINICAL IMPRESSION(S) / ED DIAGNOSES  Multiple rib fractures   Harvest Dark, MD 05/08/21 1432

## 2021-08-27 ENCOUNTER — Other Ambulatory Visit: Payer: Self-pay

## 2021-08-27 ENCOUNTER — Ambulatory Visit (INDEPENDENT_AMBULATORY_CARE_PROVIDER_SITE_OTHER): Payer: Medicare Other | Admitting: Dermatology

## 2021-08-27 DIAGNOSIS — L820 Inflamed seborrheic keratosis: Secondary | ICD-10-CM | POA: Diagnosis not present

## 2021-08-27 DIAGNOSIS — L578 Other skin changes due to chronic exposure to nonionizing radiation: Secondary | ICD-10-CM | POA: Diagnosis not present

## 2021-08-27 DIAGNOSIS — Z85828 Personal history of other malignant neoplasm of skin: Secondary | ICD-10-CM

## 2021-08-27 DIAGNOSIS — L570 Actinic keratosis: Secondary | ICD-10-CM | POA: Diagnosis not present

## 2021-08-27 DIAGNOSIS — L821 Other seborrheic keratosis: Secondary | ICD-10-CM

## 2021-08-27 DIAGNOSIS — L219 Seborrheic dermatitis, unspecified: Secondary | ICD-10-CM

## 2021-08-27 NOTE — Progress Notes (Signed)
Follow-Up Visit   Subjective  Timothy [name] is a 88 y.o. male who presents for the following: Actinic Keratosis (Recheck for new or persistent lesions on the face, scalp, and ears. ). The patient has spots, moles and lesions to be evaluated, some may be new or changing. Has spot on arm that gets irritated.   The following portions of the chart were reviewed this encounter and updated as appropriate:       Review of Systems:  No other skin or systemic complaints except as noted in HPI or Assessment and Plan.  Objective  Well appearing patient in no apparent distress; mood and affect are within normal limits.  A focused examination was performed including the face, ears, hands, arms, and scalp . Relevant physical exam findings are noted in the Assessment and Plan.  Frontal scalp x 1, L mid cheek x 1, L vertex scalp x 1 (3) Keratotic papules. Inferior vertex is clear (treated last visit)  L forearm below elbow x 1 Stuck-on, waxy papule with erythema  Scalp and temples Pink scaliness on the B/L temples.     Assessment & Plan  AK (actinic keratosis) (3) Frontal scalp x 1, L mid cheek x 1, L vertex scalp x 1  Hypertrophic  Actinic keratoses are precancerous spots that appear secondary to cumulative UV radiation exposure/sun exposure over time. They are chronic with expected duration over 1 year. A portion of actinic keratoses will progress to squamous cell carcinoma of the skin. It is not possible to reliably predict which spots will progress to skin cancer and so treatment is recommended to prevent development of skin cancer.  Recommend daily broad spectrum sunscreen SPF 30+ to sun-exposed areas, reapply every 2 hours as needed.  Recommend staying in the shade or wearing long sleeves, sun glasses (UVA+UVB protection) and wide brim hats (4-inch brim around the entire circumference of the hat). Call for new or changing lesions.   Destruction of lesion - Frontal scalp x 1, L mid  cheek x 1, L vertex scalp x 1  Destruction method: cryotherapy   Informed consent: discussed and consent obtained   Lesion destroyed using liquid nitrogen: Yes   Region frozen until ice ball extended beyond lesion: Yes   Outcome: patient tolerated procedure well with no complications   Post-procedure details: wound care instructions given   Additional details:  Prior to procedure, discussed risks of blister formation, small wound, skin dyspigmentation, or rare scar following cryotherapy. Recommend Vaseline ointment to treated areas while healing.   Inflamed seborrheic keratosis L forearm below elbow x 1  Destruction of lesion - L forearm below elbow x 1  Destruction method: cryotherapy   Informed consent: discussed and consent obtained   Lesion destroyed using liquid nitrogen: Yes   Region frozen until ice ball extended beyond lesion: Yes   Outcome: patient tolerated procedure well with no complications   Post-procedure details: wound care instructions given   Additional details:  Prior to procedure, discussed risks of blister formation, small wound, skin dyspigmentation, or rare scar following cryotherapy. Recommend Vaseline ointment to treated areas while healing.   Seborrheic dermatitis Scalp and temples  Seborrheic Dermatitis  -  is a chronic persistent rash characterized by pinkness and scaling most commonly of the mid face but also can occur on the scalp (dandruff), ears; mid chest, mid back and groin.  It tends to be exacerbated by stress and cooler weather.  People who have neurologic disease may experience new onset or exacerbation  of existing seborrheic dermatitis.  The condition is not curable but treatable and can be controlled.  Continue Ketoconazole 2% cream and HC 2.5% cream to aa's QD/bid PRN.   Related Medications hydrocortisone 2.5 % cream Apply 1-2 times daily to areas at neck, face, scalp as needed for itch.  ketoconazole (NIZORAL) 2 % cream once daily to pink  scaly patches at face, ears and scalp.   Actinic Damage - chronic, secondary to cumulative UV radiation exposure/sun exposure over time - diffuse scaly erythematous macules with underlying dyspigmentation - Recommend daily broad spectrum sunscreen SPF 30+ to sun-exposed areas, reapply every 2 hours as needed.  - Recommend staying in the shade or wearing long sleeves, sun glasses (UVA+UVB protection) and wide brim hats (4-inch brim around the entire circumference of the hat). - Call for new or changing lesions.  Seborrheic Keratoses - Stuck-on, waxy, tan-brown papules and/or plaques  - Benign-appearing - Discussed benign etiology and prognosis. - Observe - Call for any changes  History of Basal Cell Carcinoma of the Skin - No evidence of recurrence today - Recommend regular full body skin exams - Recommend daily broad spectrum sunscreen SPF 30+ to sun-exposed areas, reapply every 2 hours as needed.  - Call if any new or changing lesions are noted between office visits  History of Squamous Cell Carcinoma of the Skin - No evidence of recurrence today - Recommend regular full body skin exams - Recommend daily broad spectrum sunscreen SPF 30+ to sun-exposed areas, reapply every 2 hours as needed.  - Call if any new or changing lesions are noted between office visits  Return in about 6 months (around 02/24/2022) for AK f/u .  Luther Redo, CMA, am acting as scribe for Brendolyn Patty, MD .  Documentation: I have reviewed the above documentation for accuracy and completeness, and I agree with the above.  Brendolyn Patty MD

## 2021-08-27 NOTE — Patient Instructions (Addendum)

## 2021-10-24 NOTE — Progress Notes (Signed)
?[facility] Note ? ?10/25/2021 ? ?  ? ?CHIEF COMPLAINT ?Patient presents for Retina Evaluation ? ? ?HISTORY OF PRESENT ILLNESS: ?Timothy [name] is a 88 y.o. male who presents to the clinic today for:  ? ?HPI   ? ? Retina Evaluation   ?In left eye.  This started 1 week ago.  Duration of 1 week.  Associated Symptoms Floaters and Distortion.  I, the attending physician,  performed the HPI with the patient and updated documentation appropriately. ? ?  ?  ? ? Comments   ?Pt here for ret eval referred by Dr. Ellin Mayhew for vitreal haze and decrease in [place] OS. Pt states 1 week ago he noticed 'cotton spot' in upper portion of OS vision, also reporting some strings in OS VA over the past few days. Unsure if this has been gradual or sudden onset. Pt is diabetic 25+ years. Pt reports hx of detached retina about 30 years ago, seen Dr. Zigmund Daniel, had surgery. Most recent A1C was reported within normal levels, thinks it was around 7. Pts daughter does report some elevated sugar levels about 6 months ago, came back down in range. Pt has had rx specs 3-4 years. Pts daughter reports at Dr. Charlotte Crumb office he could only see 20/400 OS, got down to 20/30 OS today.  ? ?  ?  ?Last edited by Bernarda Caffey, MD on 10/26/2021  2:26 AM.  ?  ?A shade came over the upper part of vision OS x1 week ago.  He realized today the area has improved.  +Eliquis  ?Daughter states patient is better today than before. ? ?Referring physician: ?Orion Modest, [place] ?[address]  ?[place], [place] [address] ? ? ?HISTORICAL INFORMATION:  ? ?Selected notes from the [facility] ?Referred by Dr. Ellin Mayhew ?LEE:  ?Ocular Hx- ?PMH- ?  ? ?CURRENT MEDICATIONS: ?No current outpatient medications on file. (Ophthalmic Drugs)  ? ?No current facility-administered medications for this visit. (Ophthalmic Drugs)  ? ?Current Outpatient Medications (Other)  ?Medication Sig  ? acetaminophen (TYLENOL) 325 MG tablet Take 2 tablets (650 mg total) by mouth every  6 (six) hours as needed for mild pain or headache (fever >/= 101).  ? Calcium Citrate-Vitamin D (CALCIUM + D PO) Take 1 tablet by mouth daily.   ? carbidopa-levodopa (SINEMET IR) 25-100 MG tablet Take 3 tablets by mouth 4 (four) times daily.  ? cyanocobalamin (,VITAMIN B-12,) 1000 MCG/ML injection INJECT 1ML ONCE A WEEK FOR 14 DAYS (2 WEEKS)  and  THEN RETURN TO MONTHLY FREQUENCY  ? ELIQUIS 5 MG TABS tablet TAKE 1 TABLET BY MOUTH TWICE DAILY  ? famotidine (PEPCID) 20 MG tablet Take 20 mg by mouth at bedtime.  ? FARXIGA 10 MG TABS tablet TAKE 1 TABLET BY MOUTH DAILY  ? flecainide (TAMBOCOR) 50 MG tablet TAKE 1 TABLET BY MOUTH TWICE DAILY  ? glipiZIDE-metformin (METAGLIP) 5-500 MG tablet TAKE 1 TABLET BY MOUTH TWICE DAILY  ? GLUCOSAMINE-CHONDROITIN PO Take by mouth daily.  ? hydrocortisone 2.5 % cream Apply 1-2 times daily to areas at neck, face, scalp as needed for itch.  ? JANUVIA 100 MG tablet TAKE 1 TABLET BY MOUTH DAILY  ? ketoconazole (NIZORAL) 2 % cream once daily to pink scaly patches at face, ears and scalp.  ? metoprolol succinate (TOPROL-XL) 50 MG 24 hr tablet TAKE 1 TABLET BY MOUTH DAILY  ? Multiple Vitamins-Minerals (CEROVITE SENIOR PO) Take 1 tablet by mouth daily.  ? pantoprazole (PROTONIX) 40  MG tablet TAKE 1 TABLET BY MOUTH TWICE DAILY  ? rosuvastatin (CRESTOR) 10 MG tablet TAKE 1 TABLET BY MOUTH DAILY  ? sertraline (ZOLOFT) 50 MG tablet Take 1 tablet (50 mg total) by mouth daily.  ? STOOL SOFTENER 100 MG capsule Take 100 mg by mouth daily.  ? traMADol (ULTRAM) 50 MG tablet Take 1 tablet (50 mg total) by mouth every 6 (six) hours as needed.  ? UNABLE TO FIND Take 100 mg by mouth daily. CBD 171m  ? UNABLE TO FIND Med Name: PRussell ? ?No current facility-administered medications for this visit. (Other)  ? ? ?REVIEW OF SYSTEMS: ?ROS   ?Positive for: Gastrointestinal, Endocrine, Cardiovascular, Eyes, Psychiatric ?Negative for: Constitutional, Neurological, Skin, Genitourinary,  Musculoskeletal, HENT, Respiratory, Allergic/Imm, Heme/Lymph ?Last edited by SKingsley Spittle COT on 10/25/2021  9:34 AM.  ?  ? ?ALLERGIES ?No Known Allergies ? ?PAST MEDICAL HISTORY ?Past Medical History:  ?Diagnosis Date  ? (HFpEF) heart failure with preserved ejection fraction ([place]   ? a. 05/2019 Echo: EF 65-70%, mod LVH. Gr2 DD. Mod AS.  ? AAA (abdominal aortic aneurysm) ([facility]   ? a. 2014 Abd U/S: 3.5cm AAA; b. 10/20189 Abd U/S: 3.3cm AAA; c. 01/2018 CT Abd/Pelvis: 2.7cm infrarenal AAA - rec f/u U/S in 5 yrs.  ? Actinic keratosis   ? Back pain   ? Basal cell carcinoma 08/22/2019  ? Left mid ear helix. Nodular pattern  ? Basal cell carcinoma 04/30/2020  ? Left cheek. Superficial and nodular patterns.  ? Esophageal reflux   ? Essential hypertension   ? History of stress test   ? a. 01/2012 MV: EF 63%, no rwma. No ischemia/infarct. Low risk.  ? Mixed hyperlipidemia   ? Moderate aortic stenosis   ? a. 05/2019 Echo: Mod AS. Mean grad 29.5 mmHg. Peak grad 55.7 mmHg. AVA 1.28 cm^2 (VTI). No significant change from 2019 echo.  ? Osteitis deformans without mention of bone tumor   ? Paget's Disease  ? PAD (peripheral artery disease) ([facility]   ? a. 12/2012 and 05/2013 s/p bilat LE bypass @ [facility] in setting of bilat popliteal aneurysms; b. 05/2019 LE Duplex: R PT 50-74%, Nl L Leg.  ? Parkinson's disease ([facility]   ? Persistent atrial fibrillation ([place]   ? a. 02/2015 Event monitor: long periods of Afib--prev on flecainide; b. CHA2DS2VASc = 8-->Eliquis 5 bid.  ? Retinal detachment   ? Senile osteoporosis   ? Skin cancer   ? Squamous cell carcinoma of skin 04/02/2017  ? Left cheek. SCCis. Mohs at [facility] 2018  ? Swelling of right lower extremity   ? a. 05/2019 LE U/S: No DVT. 50-64% R PT artery stenosis.  ? TIA (transient ischemic attack)   ? Type II diabetes mellitus ([facility]   ? ?Past Surgical History:  ?Procedure Laterality Date  ? APPENDECTOMY    ? BACK SURGERY    ? CATARACT EXTRACTION    ? COLONOSCOPY  [id number] ? ESOPHAGEAL  DILATION    ? GALLBLADDER SURGERY    ? HERNIA REPAIR    ? Lower extremity arterial bypass Bilateral   ? RETINAL DETACHMENT SURGERY    ? TONSILLECTOMY    ? ?FAMILY HISTORY ?Family History  ?Problem Relation Age of Onset  ? Heart disease Mother   ? Diabetes Father   ? Retinal detachment Brother   ? Heart disease Brother   ? Stroke Brother   ? Diabetes Other   ?     grandparents  ? ?  SOCIAL HISTORY ?Social History  ? ?Tobacco Use  ? Smoking status: Never  ? Smokeless tobacco: Never  ?Vaping Use  ? Vaping Use: Never used  ?Substance Use Topics  ? Alcohol use: No  ? Drug use: No  ?  ? ?  ?OPHTHALMIC EXAM: ? ?Base Eye Exam   ? ? Visual Acuity (Snellen - Linear)   ? ?   Right Left  ? Dist cc 20/20 -2 20/30 +2  ? Dist ph cc  NI  ? ? Correction: Glasses  ? ?  ?  ? ? Tonometry (Tonopen, 10:00 AM)   ? ?   Right Left  ? Pressure 20 21  ? ?  ?  ? ? Pupils   ? ?   Dark Light Shape React APD  ? Right 3 2 Round Brisk None  ? Left 3 2 Round Brisk None  ? ?  ?  ? ? Visual Fields   ? ?   Left Right  ?  Full Full  ? ?  ?  ? ? Extraocular Movement   ? ?   Right Left  ?  Full, Ortho Full, Ortho  ? ?  ?  ? ? Neuro/Psych   ? ? Oriented x3: Yes  ? Mood/Affect: Normal  ? ?  ?  ? ? Dilation   ? ? Both eyes: 1.0% Mydriacyl, 2.5% Phenylephrine @ 10:02 AM  ? ?  ?  ? ?  ? ?Slit Lamp and Fundus Exam   ? ? External Exam   ? ?   Right Left  ? External Normal Normal  ? ?  ?  ? ? Slit Lamp Exam   ? ?   Right Left  ? Lids/Lashes Dermatochalasis - upper lid, Meibomian gland dysfunction Dermatochalasis - upper lid, Meibomian gland dysfunction  ? Conjunctiva/Sclera White and quiet White and quiet  ? Cornea 2+ Punctate epithelial erosions inferiorly, Mild debris in tear film 1-2+ Punctate epithelial erosions, dry tear film  ? Anterior Chamber Deep and quiet Deep and quiet  ? Iris Round and dilated, No NVI Round and dilated, No NVI  ? Lens PC IOL in good position PC IOL in good position  ? Anterior Vitreous Vitreous syneresis, Vitreous condensations settled  inferiorly Vitreous syneresis  ? ?  ?  ? ? Fundus Exam   ? ?   Right Left  ? Disc Pink and sharp Compact, Pink and sharp, focal PPP  ? C/D Ratio 0.3 0.3  ? Macula Flat, Good foveal reflex, RPE mottling Flat, Good fovea

## 2021-10-25 ENCOUNTER — Encounter (INDEPENDENT_AMBULATORY_CARE_PROVIDER_SITE_OTHER): Payer: Self-pay | Admitting: Ophthalmology

## 2021-10-25 ENCOUNTER — Ambulatory Visit (INDEPENDENT_AMBULATORY_CARE_PROVIDER_SITE_OTHER): Payer: Medicare Other | Admitting: Ophthalmology

## 2021-10-25 DIAGNOSIS — H04123 Dry eye syndrome of bilateral lacrimal glands: Secondary | ICD-10-CM

## 2021-10-25 DIAGNOSIS — I10 Essential (primary) hypertension: Secondary | ICD-10-CM

## 2021-10-25 DIAGNOSIS — E119 Type 2 diabetes mellitus without complications: Secondary | ICD-10-CM

## 2021-10-25 DIAGNOSIS — H35033 Hypertensive retinopathy, bilateral: Secondary | ICD-10-CM

## 2021-10-25 DIAGNOSIS — H539 Unspecified visual disturbance: Secondary | ICD-10-CM

## 2021-10-25 DIAGNOSIS — H3322 Serous retinal detachment, left eye: Secondary | ICD-10-CM

## 2021-10-25 DIAGNOSIS — Z8669 Personal history of other diseases of the nervous system and sense organs: Secondary | ICD-10-CM

## 2021-10-25 DIAGNOSIS — Z961 Presence of intraocular lens: Secondary | ICD-10-CM

## 2021-10-25 DIAGNOSIS — H3581 Retinal edema: Secondary | ICD-10-CM

## 2021-10-26 ENCOUNTER — Encounter (INDEPENDENT_AMBULATORY_CARE_PROVIDER_SITE_OTHER): Payer: Self-pay | Admitting: Ophthalmology

## 2021-11-26 ENCOUNTER — Encounter (INDEPENDENT_AMBULATORY_CARE_PROVIDER_SITE_OTHER): Payer: Medicare Other | Admitting: Ophthalmology

## 2021-11-26 DIAGNOSIS — H04123 Dry eye syndrome of bilateral lacrimal glands: Secondary | ICD-10-CM

## 2021-11-26 DIAGNOSIS — I10 Essential (primary) hypertension: Secondary | ICD-10-CM

## 2021-11-26 DIAGNOSIS — Z8669 Personal history of other diseases of the nervous system and sense organs: Secondary | ICD-10-CM

## 2021-11-26 DIAGNOSIS — H35033 Hypertensive retinopathy, bilateral: Secondary | ICD-10-CM

## 2021-11-26 DIAGNOSIS — Z961 Presence of intraocular lens: Secondary | ICD-10-CM

## 2021-11-26 DIAGNOSIS — H539 Unspecified visual disturbance: Secondary | ICD-10-CM

## 2021-11-26 DIAGNOSIS — E119 Type 2 diabetes mellitus without complications: Secondary | ICD-10-CM

## 2022-03-05 ENCOUNTER — Telehealth: Payer: Self-pay | Admitting: Cardiovascular Disease

## 2022-03-05 NOTE — Telephone Encounter (Signed)
Looked through chart and cannot find notes on reason for call. Spoke with daughter, [name], and she stated she is wondering if called that Gollan had sooner appt. Reviewed the appts nothing at this time was noted open on his schedule before his 10/2 already schedule appointment.

## 2022-03-05 NOTE — Telephone Encounter (Signed)
Pt daughter states someone called her. I didn't see a phone note so I wasn't sure who to contact. Please advise.

## 2022-03-11 ENCOUNTER — Ambulatory Visit (INDEPENDENT_AMBULATORY_CARE_PROVIDER_SITE_OTHER): Payer: Medicare Other | Admitting: Dermatology

## 2022-03-11 DIAGNOSIS — L570 Actinic keratosis: Secondary | ICD-10-CM

## 2022-03-11 DIAGNOSIS — C44219 Basal cell carcinoma of skin of left ear and external auricular canal: Secondary | ICD-10-CM

## 2022-03-11 DIAGNOSIS — L578 Other skin changes due to chronic exposure to nonionizing radiation: Secondary | ICD-10-CM | POA: Diagnosis not present

## 2022-03-11 DIAGNOSIS — D485 Neoplasm of uncertain behavior of skin: Secondary | ICD-10-CM

## 2022-03-11 NOTE — Patient Instructions (Addendum)
Cryotherapy Aftercare  Wash gently with soap and water everyday.   Apply Vaseline and Band-Aid daily until healed.  Wound Care Instructions  Cleanse wound gently with soap and water once a day then pat dry with clean gauze. Apply a thin coat of Petrolatum (petroleum jelly, "Vaseline") over the wound (unless you have an allergy to this). We recommend that you use a new, sterile tube of Vaseline. Do not pick or remove scabs. Do not remove the yellow or white "healing tissue" from the base of the wound.  Cover the wound with fresh, clean, nonstick gauze and secure with paper tape. You may use Band-Aids in place of gauze and tape if the wound is small enough, but would recommend trimming much of the tape off as there is often too much. Sometimes Band-Aids can irritate the skin.  You should call the office for your biopsy report after 1 week if you have not already been contacted.  If you experience any problems, such as abnormal amounts of bleeding, swelling, significant bruising, significant pain, or evidence of infection, please call the office immediately.  FOR ADULT SURGERY PATIENTS: If you need something for pain relief you may take 1 extra strength Tylenol (acetaminophen) AND 2 Ibuprofen (200mg each) together every 4 hours as needed for pain. (do not take these if you are allergic to them or if you have a reason you should not take them.) Typically, you may only need pain medication for 1 to 3 days.      Due to recent changes in healthcare laws, you may see results of your pathology and/or laboratory studies on MyChart before the doctors have had a chance to review them. We understand that in some cases there may be results that are confusing or concerning to you. Please understand that not all results are received at the same time and often the doctors may need to interpret multiple results in order to provide you with the best plan of care or course of treatment. Therefore, we ask that you  please give us 2 business days to thoroughly review all your results before contacting the office for clarification. Should we see a critical lab result, you will be contacted sooner.   If You Need Anything After Your Visit  If you have any questions or concerns for your doctor, please call our main line at [phone] and press option 4 to reach your doctor's medical assistant. If no one answers, please leave a voicemail as directed and we will return your call as soon as possible. Messages left after 4 pm will be answered the following business day.   You may also send us a message via MyChart. We typically respond to MyChart messages within 1-2 business days.  For prescription refills, please ask your pharmacy to contact our office. Our fax number is [phone].  If you have an urgent issue when the clinic is closed that cannot wait until the next business day, you can page your doctor at the number below.    Please note that while we do our best to be available for urgent issues outside of office hours, we are not available 24/7.   If you have an urgent issue and are unable to reach us, you may choose to seek medical care at your doctor's office, retail clinic, urgent care center, or emergency room.  If you have a medical emergency, please immediately call 911 or go to the emergency department.  Pager Numbers  - Dr. Kowalski: [phone]  -   Dr. Moye: [phone]  - Dr. Stewart: [phone]  In the event of inclement weather, please call our main line at [phone] for an update on the status of any delays or closures.  Dermatology Medication Tips: Please keep the boxes that topical medications come in in order to help keep track of the instructions about where and how to use these. Pharmacies typically print the medication instructions only on the boxes and not directly on the medication tubes.   If your medication is too expensive, please contact our office at  [phone] option 4 or send us a message through MyChart.   We are unable to tell what your co-pay for medications will be in advance as this is different depending on your insurance coverage. However, we may be able to find a substitute medication at lower cost or fill out paperwork to get insurance to cover a needed medication.   If a prior authorization is required to get your medication covered by your insurance company, please allow us 1-2 business days to complete this process.  Drug prices often vary depending on where the prescription is filled and some pharmacies may offer cheaper prices.  The website www.goodrx.com contains coupons for medications through different pharmacies. The prices here do not account for what the cost may be with help from insurance (it may be cheaper with your insurance), but the website can give you the price if you did not use any insurance.  - You can print the associated coupon and take it with your prescription to the pharmacy.  - You may also stop by our office during regular business hours and pick up a GoodRx coupon card.  - If you need your prescription sent electronically to a different pharmacy, notify our office through [facility] MyChart or by phone at [phone] option 4.     Si Usted Necesita Algo Despus de Su Visita  Tambin puede enviarnos un mensaje a travs de MyChart. Por lo general respondemos a los mensajes de MyChart en el transcurso de 1 a 2 das hbiles.  Para renovar recetas, por favor pida a su farmacia que se ponga en contacto con nuestra oficina. Nuestro nmero de fax es el [phone].  Si tiene un asunto urgente cuando la clnica est cerrada y que no puede esperar hasta el siguiente da hbil, puede llamar/localizar a su doctor(a) al nmero que aparece a continuacin.   Por favor, tenga en cuenta que aunque hacemos todo lo posible para estar disponibles para asuntos urgentes fuera del horario de oficina, no estamos  disponibles las 24 horas del da, los 7 das de la semana.   Si tiene un problema urgente y no puede comunicarse con nosotros, puede optar por buscar atencin mdica  en el consultorio de su doctor(a), en una clnica privada, en un centro de atencin urgente o en una sala de emergencias.  Si tiene una emergencia mdica, por favor llame inmediatamente al 911 o vaya a la sala de emergencias.  Nmeros de bper  - Dr. Kowalski: [phone]  - Dra. Moye: [phone]  - Dra. Stewart: [phone]  En caso de inclemencias del tiempo, por favor llame a nuestra lnea principal al [phone] para una actualizacin sobre el estado de cualquier retraso o cierre.  Consejos para la medicacin en dermatologa: Por favor, guarde las cajas en las que vienen los medicamentos de uso tpico para ayudarle a seguir las instrucciones sobre dnde y cmo usarlos. Las farmacias generalmente imprimen las instrucciones del medicamento slo en las cajas y   no directamente en los tubos del medicamento.   Si su medicamento es muy caro, por favor, pngase en contacto con nuestra oficina llamando al [phone] y presione la opcin 4 o envenos un mensaje a travs de MyChart.   No podemos decirle cul ser su copago por los medicamentos por adelantado ya que esto es diferente dependiendo de la cobertura de su seguro. Sin embargo, es posible que podamos encontrar un medicamento sustituto a menor costo o llenar un formulario para que el seguro cubra el medicamento que se considera necesario.   Si se requiere una autorizacin previa para que su compaa de seguros cubra su medicamento, por favor permtanos de 1 a 2 das hbiles para completar este proceso.  Los precios de los medicamentos varan con frecuencia dependiendo del lugar de dnde se surte la receta y alguna farmacias pueden ofrecer precios ms baratos.  El sitio web www.goodrx.com tiene cupones para medicamentos de diferentes farmacias. Los precios aqu no  tienen en cuenta lo que podra costar con la ayuda del seguro (puede ser ms barato con su seguro), pero el sitio web puede darle el precio si no utiliz ningn seguro.  - Puede imprimir el cupn correspondiente y llevarlo con su receta a la farmacia.  - Tambin puede pasar por nuestra oficina durante el horario de atencin regular y recoger una tarjeta de cupones de GoodRx.  - Si necesita que su receta se enve electrnicamente a una farmacia diferente, informe a nuestra oficina a travs de MyChart de [place] o por telfono llamando al [phone] y presione la opcin 4.  

## 2022-03-11 NOTE — Progress Notes (Addendum)
Follow-Up Visit   Subjective  Timothy [name] is a 88 y.o. male who presents for the following: Follow-up (Patient here today for 6 month AK follow up. Patient has noticed some areas at left ear.).  Patient does have hx of BCC. Bx-proven BCC L mid ear helix 2/21- not treated, also L cheek 10/21-excised  The following portions of the chart were reviewed this encounter and updated as appropriate:       Review of Systems:  No other skin or systemic complaints except as noted in HPI or Assessment and Plan.  Objective  Well appearing patient in no apparent distress; mood and affect are within normal limits.  A focused examination was performed including face, scalp and ears. Relevant physical exam findings are noted in the Assessment and Plan.  left mid ear helix 1.2 cm pink white crusted macule     left cheek x 5, right cheek x 3, frontal scalp x 1, posterior crown x 6 (15) Erythematous thin papules/macules with gritty scale.     Assessment & Plan  Neoplasm of uncertain behavior of skin left mid ear helix  Skin / nail biopsy Type of biopsy: tangential   Informed consent: discussed and consent obtained   Patient was prepped and draped in usual sterile fashion: Area prepped with alcohol. Anesthesia: the lesion was anesthetized in a standard fashion   Anesthetic:  1% lidocaine w/ epinephrine 1-100,000 buffered w/ 8.4% NaHCO3 Instrument used: flexible razor blade   Hemostasis achieved with: pressure, aluminum chloride and electrodesiccation   Outcome: patient tolerated procedure well   Post-procedure details: wound care instructions given   Post-procedure details comment:  Ointment and small bandage applied  Specimen 1 - Surgical pathology Differential Diagnosis: r/o BCC  Check Margins: No 1.2 cm pink white crusted macule [id number]  Probable recurrent BCC, previous bx was 2/21, but wasn't treated  Will refer for Mohs at [facility] if +    AK (actinic keratosis)  (15) left cheek x 5, right cheek x 3, frontal scalp x 1, posterior crown x 6  Actinic keratoses are precancerous spots that appear secondary to cumulative UV radiation exposure/sun exposure over time. They are chronic with expected duration over 1 year. A portion of actinic keratoses will progress to squamous cell carcinoma of the skin. It is not possible to reliably predict which spots will progress to skin cancer and so treatment is recommended to prevent development of skin cancer.  Recommend daily broad spectrum sunscreen SPF 30+ to sun-exposed areas, reapply every 2 hours as needed.  Recommend staying in the shade or wearing long sleeves, sun glasses (UVA+UVB protection) and wide brim hats (4-inch brim around the entire circumference of the hat). Call for new or changing lesions.   Destruction of lesion - left cheek x 5, right cheek x 3, frontal scalp x 1, posterior crown x 6  Destruction method: cryotherapy   Informed consent: discussed and consent obtained   Lesion destroyed using liquid nitrogen: Yes   Region frozen until ice ball extended beyond lesion: Yes   Outcome: patient tolerated procedure well with no complications   Post-procedure details: wound care instructions given   Additional details:  Prior to procedure, discussed risks of blister formation, small wound, skin dyspigmentation, or rare scar following cryotherapy. Recommend Vaseline ointment to treated areas while healing.   Actinic Damage - chronic, secondary to cumulative UV radiation exposure/sun exposure over time - diffuse scaly erythematous macules with underlying dyspigmentation - Recommend daily broad spectrum sunscreen SPF 30+  to sun-exposed areas, reapply every 2 hours as needed.  - Recommend staying in the shade or wearing long sleeves, sun glasses (UVA+UVB protection) and wide brim hats (4-inch brim around the entire circumference of the hat). - Call for new or changing lesions.   Return in about 6 months  (around 09/09/2022) for AK follow up, h/o BCC.  Graciella Belton, RMA, am acting as scribe for Brendolyn Patty, MD .  Documentation: I have reviewed the above documentation for accuracy and completeness, and I agree with the above.  Brendolyn Patty MD

## 2022-03-17 ENCOUNTER — Telehealth: Payer: Self-pay

## 2022-03-17 DIAGNOSIS — C44219 Basal cell carcinoma of skin of left ear and external auricular canal: Secondary | ICD-10-CM

## 2022-03-17 NOTE — Telephone Encounter (Signed)
-----   Message from Brendolyn Patty, MD sent at 03/17/2022 10:56 AM EDT ----- Skin , left mid ear helix BASAL CELL CARCINOMA, NODULAR PATTERN  BCC skin cancer, needs Mohs surgery at [facility]   - please call patient

## 2022-03-17 NOTE — Telephone Encounter (Signed)
Advised pt's daughter of bx results and advised we would send referral to [facility] for Mohs.  Referral placed./sh

## 2022-03-24 ENCOUNTER — Encounter: Payer: Self-pay | Admitting: Dermatology

## 2022-03-25 ENCOUNTER — Telehealth: Payer: Self-pay

## 2022-03-25 NOTE — Telephone Encounter (Signed)
Patient daughter called wanting to know if Mohs surgery was the only option for [facility] of the ear for her father also why was  Dr Chauncey Cruel in a hurry to have him scheduled for Mohs, pt daughter would like to know was It because of the lost records from when pt was here in 2021 or if this skin cancer is aggressive.   Discussed with patient daughter [facility] skin cancer need be treated and Mohs surgery with Dr Manley Mason will give pt the best cure rate so Dr Chauncey Cruel recommend Mohs, but Mohs is not the only option he could try radiation or EDC here in the office which the cure rate is 80%.  Patient daughter will keep scheduled Mohs appt tomorrow at [facility] with Dr Manley Mason

## 2022-03-26 ENCOUNTER — Telehealth: Payer: Self-pay

## 2022-03-26 NOTE — Telephone Encounter (Signed)
Called pt daughter 03/25/22 discussed with her pt was scheduled here for [facility] of biopsy proven BCC of the left ear 09/12/2019 the patient cancelled this appt and never called back to reschedule, per pt daughter the cancelled appt could be due to patient was in the care of a different person at that time, then 09/19/19 patient was hospitalized with Covid, then he went to rehab at [facility] for several months, after rehab patient was then placed back in the care of his daughter.

## 2022-03-27 ENCOUNTER — Telehealth: Payer: Self-pay

## 2022-03-27 NOTE — Telephone Encounter (Signed)
MOHs progress notes from Dr. Dimple Nanas office scanned in under the MEDIA tab for your review.

## 2022-03-28 ENCOUNTER — Telehealth: Payer: Self-pay | Admitting: Cardiovascular Disease

## 2022-03-28 NOTE — Telephone Encounter (Signed)
Patient stated he would like to get a referral to a physician for his wife's varicose veins.

## 2022-03-31 NOTE — Telephone Encounter (Signed)
Attempted to call patient's home number back, person who answered told me that I had the wrong number.   Left a message for patient's daughter.

## 2022-04-01 NOTE — Telephone Encounter (Signed)
Timothy Merritts, MD    03/29/22 10:35 AM I do not think his wife is seen in this clinic  He remarried several years ago  She will need to ask her primary care for referral  She could call vein and vascular without referral, they would likely see her (Dr. Junie Panning)  Thx  TG   Notified the patient's daughter. Verbalized understanding. Corrected the patient phone number in the chart.

## 2022-04-01 NOTE — Telephone Encounter (Signed)
Daughter was returning call. Please advise  ?

## 2022-04-05 NOTE — Progress Notes (Unsigned)
Cardiology Office Note  Date:  04/05/2022   ID:  Timothy [name], DOB 31-May-1929, MRN [id number]  PCP:  [facility]   No chief complaint on file.   HPI:  Timothy [name] is a  88 year-old gentleman with a history of  Hyperlipidemia, hypertension,  diabetes,  Severe aortic valve stenosis. Atrial fibrillation seen on event monitor, on anticoagulation , bilateral popliteal artery aneurysms, B/l popliteal bypass in 12/2012 and 05/2013 for 1.7 cm aneurysm popliteal Back surgery 2016 He presents today for follow-up of his high cholesterol, PAD, Parkinsons, severe aortic valve stenosis  paroxysmal atrial fibrillation  Last seen by myself in clinic August 2022  Presents today with daughter In the ER 01/05/2021 lightheadedness and generalized weakness that occurred while he was sitting at breakfast. CR 1.26  In the ER 10/27/2020 new syncopal episode while trying to have a BM in the toilet today  feeling dizzy   Does not drink a lot of fluids  Periodic episodes of near syncope Lives at [facility] Has visiting nurse daily in the [place]  Notes reviewed from [facility] concerning TAVR "Given his poor functional status with advanced Parkinson's disease and advanced age, I do not think he is a candidate for TAVR"  Prior records reviewed Echo: 08/2020 Ef normal severe aortic valve  stenosis. Aortic valve area, by  VTI measures 0.87 cm. Aortic valve mean gradient measures 40.0 mmHg.  Aortic valve Vmax measures 4.13 m/s.   Covid in 2021 Required rehab,  EKG personally reviewed by myself on todays visit Shows normal sinus rhythm rate 62 bpm left axis deviation right bundle branch block  Other past medical history reviewed  emergency room February 17, 2019 for near syncope Vasovagal episode on the toilet  Prior lab work reviewed [facility] 7.7  Fall 02/16/2017 Hit head, concusion  surgery at [facility] in June 2016 with postoperative complications including ileus, atrial fibrillation,  congestive heart failure/swelling. spent several weeks at rehabilitation.    12/14/2012 he had popliteal aneurysm surgery at [facility]. Other leg was done 05/21/2013.    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  PSH:    Past Surgical History:  Procedure Laterality Date   APPENDECTOMY     BACK SURGERY     CATARACT EXTRACTION     COLONOSCOPY  2006,2009   ESOPHAGEAL DILATION     GALLBLADDER SURGERY     HERNIA REPAIR     Lower extremity arterial bypass Bilateral    RETINAL DETACHMENT SURGERY     TONSILLECTOMY      Current Outpatient Medications  Medication Sig Dispense Refill   acetaminophen (TYLENOL) 325 MG tablet Take 2 tablets (650 mg total) by mouth every 6 (six) hours as needed for mild pain or headache (fever >/= 101).     Calcium Citrate-Vitamin D (CALCIUM + D PO) Take 1 tablet by mouth daily.      carbidopa-levodopa (SINEMET IR) 25-100 MG tablet Take 3 tablets by mouth 4 (four) times daily.  cyanocobalamin (,VITAMIN B-12,) 1000 MCG/ML injection INJECT 1ML ONCE A WEEK FOR 14 DAYS (2 WEEKS)  and  THEN RETURN TO MONTHLY FREQUENCY     ELIQUIS 5 MG TABS tablet TAKE 1 TABLET BY MOUTH TWICE DAILY 60 tablet 5   famotidine (PEPCID) 20 MG tablet Take 20 mg by mouth at bedtime.     FARXIGA 10 MG TABS tablet TAKE 1 TABLET BY  MOUTH DAILY 60 tablet 0   flecainide (TAMBOCOR) 50 MG tablet TAKE 1 TABLET BY MOUTH TWICE DAILY 60 tablet 10   glipiZIDE-metformin (METAGLIP) 5-500 MG tablet TAKE 1 TABLET BY MOUTH TWICE DAILY 60 tablet 0   GLUCOSAMINE-CHONDROITIN PO Take by mouth daily.     hydrocortisone 2.5 % cream Apply 1-2 times daily to areas at neck, face, scalp as needed for itch. 30 g 2   JANUVIA 100 MG tablet TAKE 1 TABLET BY MOUTH DAILY 90 tablet 0   ketoconazole (NIZORAL) 2 % cream once daily to pink scaly patches at face, ears and scalp. 60 g 2   metoprolol succinate (TOPROL-XL) 50 MG 24 hr tablet TAKE 1 TABLET BY MOUTH DAILY 30 tablet 2   Multiple Vitamins-Minerals (CEROVITE SENIOR PO) Take 1 tablet by mouth daily.     pantoprazole (PROTONIX) 40 MG tablet TAKE 1 TABLET BY MOUTH TWICE DAILY 180 tablet 4   rosuvastatin (CRESTOR) 10 MG tablet TAKE 1 TABLET BY MOUTH DAILY 30 tablet 2   sertraline (ZOLOFT) 50 MG tablet Take 1 tablet (50 mg total) by mouth daily. 90 tablet 4   STOOL SOFTENER 100 MG capsule Take 100 mg by mouth daily.     traMADol (ULTRAM) 50 MG tablet Take 1 tablet (50 mg total) by mouth every 6 (six) hours as needed. 20 tablet 0   UNABLE TO FIND Take 100 mg by mouth daily. CBD '100mg'$      UNABLE TO FIND Med Name: Chambers     No current facility-administered medications for this visit.     Allergies:   Patient has no known allergies.   Social History:  The patient  reports that he has never smoked. He has never used smokeless tobacco. He reports that he does not drink alcohol and does not use drugs.   Family History:   family history includes Diabetes in his father and another family member; Heart disease in his brother and mother; Retinal detachment in his brother; Stroke in his brother.    Review of Systems: Review of Systems  Constitutional: Negative.   HENT: Negative.    Respiratory: Negative.    Cardiovascular: Negative.   Gastrointestinal: Negative.   Musculoskeletal:         Unsteady gait, leg weakness  Neurological: Negative.   Psychiatric/Behavioral: Negative.    All other systems reviewed and are negative.   PHYSICAL EXAM: VS:  There were no vitals taken for this visit. , BMI There is no height or weight on file to calculate BMI. Constitutional:  oriented to person, place, and time. No distress.  In a wheelchair, soft-spoken HENT:  Head: Grossly normal Eyes:  no discharge. No scleral icterus.  Neck: No JVD, no carotid bruits  Cardiovascular: Regular rate and rhythm, no murmurs appreciated Pulmonary/Chest: Clear to auscultation bilaterally, no wheezes or rails Abdominal: Soft.  no distension.  no tenderness.  Musculoskeletal: Normal range of motion Neurological:  normal muscle tone. Coordination normal. No atrophy Skin: Skin warm and dry Psychiatric: normal affect, pleasant   Recent Labs: No results found for requested  labs within last 365 days.    Lipid Panel Lab Results  Component Value Date   CHOL 116 12/10/2017   HDL 32 (L) 12/10/2017   LDLCALC 21 12/10/2017   TRIG 317 (H) 12/10/2017    Wt Readings from Last 3 Encounters:  05/08/21 188 lb 0.8 oz (85.3 kg)  03/09/21 188 lb (85.3 kg)  02/19/21 188 lb (85.3 kg)     ASSESSMENT AND PLAN:  Severe aortic valve stenosis Gradient within severe range, "Given his poor functional status with advanced Parkinson's disease and advanced age, I do not think he is a candidate for TAVR"  Paroxysmal atrial fibrillation (HCC)  Maintaining normal sinus rhythm, on anticoagulation, beta-blocker No recent falls  AAA (abdominal aortic aneurysm) without rupture (HCC) 3.7 cm July 2019 We will need periodic evaluation  Essential hypertension Blood pressure is well controlled on today's visit. No changes made to the medications.  Type 2 diabetes mellitus with other circulatory complication, without long-term current use of insulin (HCC) Typically runs high 8.5 most recently "Doctors making house  calls" managing Weight has been stable  Parkinson's disease ([place]) Soft spoken, no recent falls On Sinemet,  Legs weak in general Using a walker  Peripheral arterial disease ([place]) Bilateral popliteal aneurysm,  bilateral bypass surgery at [facility] Disease of anterior tibial vessels,  Denies claudication symptoms Minimally active  Aortic valve stenosis  Severe aortic valve stenosis on recent echocardiogram, discussed gradients, estimated    Total encounter time more than 25 minutes  Greater than 50% was spent in counseling and coordination of care with the patient   No orders of the defined types were placed in this encounter.    Signed, Esmond Plants, M.D., Ph.D. 04/05/2022  [place], [place]

## 2022-04-07 ENCOUNTER — Ambulatory Visit: Payer: Medicare Other | Attending: Cardiovascular Disease | Admitting: Cardiovascular Disease

## 2022-04-07 VITALS — BP 124/60 | HR 63 | Ht 69.0 in | Wt 181.0 lb

## 2022-04-07 DIAGNOSIS — I480 Paroxysmal atrial fibrillation: Secondary | ICD-10-CM | POA: Diagnosis not present

## 2022-04-07 DIAGNOSIS — I5032 Chronic diastolic (congestive) heart failure: Secondary | ICD-10-CM | POA: Diagnosis not present

## 2022-04-07 DIAGNOSIS — I10 Essential (primary) hypertension: Secondary | ICD-10-CM | POA: Insufficient documentation

## 2022-04-07 DIAGNOSIS — E1159 Type 2 diabetes mellitus with other circulatory complications: Secondary | ICD-10-CM | POA: Diagnosis present

## 2022-04-07 DIAGNOSIS — I739 Peripheral vascular disease, unspecified: Secondary | ICD-10-CM | POA: Insufficient documentation

## 2022-04-07 DIAGNOSIS — E785 Hyperlipidemia, unspecified: Secondary | ICD-10-CM | POA: Diagnosis present

## 2022-04-07 DIAGNOSIS — I350 Nonrheumatic aortic (valve) stenosis: Secondary | ICD-10-CM | POA: Diagnosis not present

## 2022-04-07 DIAGNOSIS — Z9181 History of falling: Secondary | ICD-10-CM | POA: Insufficient documentation

## 2022-04-07 DIAGNOSIS — E782 Mixed hyperlipidemia: Secondary | ICD-10-CM | POA: Insufficient documentation

## 2022-04-07 DIAGNOSIS — G20A1 Parkinson's disease without dyskinesia, without mention of fluctuations: Secondary | ICD-10-CM | POA: Diagnosis not present

## 2022-04-07 MED ORDER — METOPROLOL SUCCINATE ER 50 MG PO TB24: 50.0000 mg | ORAL_TABLET | Freq: Every day | ORAL | 3 refills | Status: DC

## 2022-04-07 MED ORDER — ROSUVASTATIN CALCIUM 10 MG PO TABS: 10.0000 mg | ORAL_TABLET | Freq: Every day | ORAL | 3 refills | Status: DC

## 2022-04-07 MED ORDER — FLECAINIDE ACETATE 50 MG PO TABS: 50.0000 mg | ORAL_TABLET | Freq: Two times a day (BID) | ORAL | 3 refills | Status: DC

## 2022-04-07 MED ORDER — APIXABAN 5 MG PO TABS: 5.0000 mg | ORAL_TABLET | Freq: Two times a day (BID) | ORAL | 3 refills | Status: DC

## 2022-04-07 NOTE — Patient Instructions (Addendum)
Medication Instructions:  No changes  If you need a refill on your cardiac medications before your next appointment, please call your pharmacy.   Lab work: No new labs needed  Testing/Procedures: No new testing needed  Follow-Up: At [facility], you and your health needs are our priority.  As part of our continuing mission to provide you with exceptional heart care, we have created designated Provider Care Teams.  These Care Teams include your primary Cardiologist (physician) and Advanced Practice Providers (APPs -  Physician Assistants and Nurse Practitioners) who all work together to provide you with the care you need, when you need it.  You will need a follow up appointment in 6 months, , APP ok  Providers on your designated Care Team:   Murray Hodgkins, NP Christell Faith, PA-C Cadence Kathlen Mody, [place]  COVID-19 Vaccine Information can be found at: [email or web address] For questions related to vaccine distribution or appointments, please email [email or web address] or call [phone].

## 2022-04-14 ENCOUNTER — Telehealth: Payer: Self-pay

## 2022-04-14 NOTE — Telephone Encounter (Signed)
History and specimen tracking updated per MOHs progress note. 

## 2022-04-27 ENCOUNTER — Emergency Department
Admission: EM | Admit: 2022-04-27 | Discharge: 2022-04-27 | Disposition: A | Discharge status: Home / Self Care | Payer: Medicare Other | Attending: Emergency Medicine | Admitting: Emergency Medicine

## 2022-04-27 ENCOUNTER — Emergency Department: Payer: Medicare Other

## 2022-04-27 ENCOUNTER — Other Ambulatory Visit: Payer: Self-pay

## 2022-04-27 DIAGNOSIS — R0781 Pleurodynia: Secondary | ICD-10-CM | POA: Diagnosis not present

## 2022-04-27 DIAGNOSIS — R0602 Shortness of breath: Secondary | ICD-10-CM | POA: Diagnosis not present

## 2022-04-27 DIAGNOSIS — I10 Essential (primary) hypertension: Secondary | ICD-10-CM | POA: Diagnosis not present

## 2022-04-27 DIAGNOSIS — Z7901 Long term (current) use of anticoagulants: Secondary | ICD-10-CM | POA: Insufficient documentation

## 2022-04-27 DIAGNOSIS — E119 Type 2 diabetes mellitus without complications: Secondary | ICD-10-CM | POA: Insufficient documentation

## 2022-04-27 DIAGNOSIS — R1012 Left upper quadrant pain: Secondary | ICD-10-CM | POA: Insufficient documentation

## 2022-04-27 DIAGNOSIS — R079 Chest pain, unspecified: Secondary | ICD-10-CM | POA: Diagnosis present

## 2022-04-27 LAB — CBC WITH DIFFERENTIAL/PLATELET
Abs Immature Granulocytes: 0.06 10*3/uL (ref 0.00–0.07)
Basophils Absolute: 0.1 10*3/uL (ref 0.0–0.1)
Basophils Relative: 1 %
Eosinophils Absolute: 0.4 10*3/uL (ref 0.0–0.5)
Eosinophils Relative: 7 %
HCT: 33.3 % — ABNORMAL LOW (ref 39.0–52.0)
Hemoglobin: 10.1 g/dL — ABNORMAL LOW (ref 13.0–17.0)
Immature Granulocytes: 1 %
Lymphocytes Relative: 28 %
Lymphs Abs: 1.7 10*3/uL (ref 0.7–4.0)
MCH: 29.4 pg (ref 26.0–34.0)
MCHC: 30.3 g/dL (ref 30.0–36.0)
MCV: 97.1 fL (ref 80.0–100.0)
Monocytes Absolute: 0.8 10*3/uL (ref 0.1–1.0)
Monocytes Relative: 13 %
Neutro Abs: 3 10*3/uL (ref 1.7–7.7)
Neutrophils Relative %: 50 %
Platelets: 167 10*3/uL (ref 150–400)
RBC: 3.43 MIL/uL — ABNORMAL LOW (ref 4.22–5.81)
RDW: 14.6 % (ref 11.5–15.5)
WBC: 6.1 10*3/uL (ref 4.0–10.5)
nRBC: 0 % (ref 0.0–0.2)

## 2022-04-27 LAB — COMPREHENSIVE METABOLIC PANEL
ALT: 6 U/L (ref 0–44)
AST: 27 U/L (ref 15–41)
Albumin: 3.4 g/dL — ABNORMAL LOW (ref 3.5–5.0)
Alkaline Phosphatase: 48 U/L (ref 38–126)
Anion gap: 6 (ref 5–15)
BUN: 14 mg/dL (ref 8–23)
CO2: 22 mmol/L (ref 22–32)
Calcium: 7.8 mg/dL — ABNORMAL LOW (ref 8.9–10.3)
Chloride: 111 mmol/L (ref 98–111)
Creatinine, Ser: 1.01 mg/dL (ref 0.61–1.24)
GFR, Estimated: >60 mL/min (ref >60)
Glucose, Bld: 175 mg/dL — ABNORMAL HIGH (ref 70–99)
Potassium: 4 mmol/L (ref 3.5–5.1)
Sodium: 139 mmol/L (ref 135–145)
Total Bilirubin: 0.7 mg/dL (ref 0.3–1.2)
Total Protein: 6.4 g/dL — ABNORMAL LOW (ref 6.5–8.1)

## 2022-04-27 LAB — URINALYSIS, ROUTINE W REFLEX MICROSCOPIC
Bacteria, UA: NONE SEEN
Bilirubin Urine: NEGATIVE
Glucose, UA: >=500 mg/dL — AB
Hgb urine dipstick: NEGATIVE
Ketones, ur: 5 mg/dL — AB
Leukocytes,Ua: NEGATIVE
Nitrite: NEGATIVE
Protein, ur: NEGATIVE mg/dL
Specific Gravity, Urine: 1.039 — ABNORMAL HIGH (ref 1.005–1.030)
Squamous Epithelial / HPF: NONE SEEN (ref 0–5)
WBC, UA: NONE SEEN WBC/hpf (ref 0–5)
pH: 5 (ref 5.0–8.0)

## 2022-04-27 LAB — BRAIN NATRIURETIC PEPTIDE: B Natriuretic Peptide: 156.4 pg/mL — ABNORMAL HIGH (ref 0.0–100.0)

## 2022-04-27 LAB — TROPONIN I (HIGH SENSITIVITY)
Troponin I (High Sensitivity): 12 ng/L (ref <18)
Troponin I (High Sensitivity): 12 ng/L (ref <18)

## 2022-04-27 LAB — D-DIMER, QUANTITATIVE: D-Dimer, Quant: 1.18 ug/mL-FEU — ABNORMAL HIGH (ref 0.00–0.50)

## 2022-04-27 LAB — PROCALCITONIN: Procalcitonin: <0.1 ng/mL

## 2022-04-27 MED ORDER — IOHEXOL 350 MG/ML SOLN
75.0000 mL | Freq: Once | INTRAVENOUS | Status: AC | PRN
  Administered 2022-04-27: 75 mL via INTRAVENOUS

## 2022-04-27 NOTE — ED Triage Notes (Signed)
Pt to ED from [facility] independent [facility] via [facility] Pt complains of R chest pain since yesterday, pain comes and goes. Not worse with inspiration.  '324mg'$  aspirin given by EMS, 20# EMS IV to R forearm  EMS VS: CBG 158, HR 68, 157/72. RBBB on ems ekg, hx of same  Hx TIA, PD  EDP at bedside

## 2022-04-27 NOTE — Discharge Instructions (Addendum)
Not sure what is causing the pain.  The pain on the right side seems to have disappeared.  The abdominal CT is negative.  The lab work is essentially normal.  There is nothing in the urine either.  I will let him go.  Please have him return at once if the pain returns.  Please have him follow-up with his regular doctor this coming week.

## 2022-04-27 NOTE — ED Provider Notes (Signed)
[facility] Provider Note    Event Date/Time   First MD Initiated Contact with Patient 04/27/22 1002     (approximate)   History   Chest Pain   HPI  Timothy [name] is a 88 y.o. male who complains of pain in the right lower chest worse with deep breathing.  Its not there all the time.  He does not recall having any fever.  He does not recall having any cough.  He is certainly not coughing or having a fever now.  He does not remember any change in his baseline shortness of breath which he has with exertion.  He is not having any pain currently with or without deep breathing or to palpation.   Past medical history includes:  Hyperlipidemia, hypertension,  diabetes,  Severe aortic valve stenosis. Atrial fibrillation seen on event monitor, on anticoagulation , bilateral popliteal artery aneurysms, B/l popliteal bypass in 12/2012 and 05/2013 for 1.7 cm aneurysm popliteal Back surgery 2016   Physical Exam   Triage Vital Signs: ED Triage Vitals [04/27/22 1002]  Enc Vitals Group     BP      Pulse      Resp      Temp      Temp src      SpO2      Weight      Height      Head Circumference      Peak Flow      Pain Score 0     Pain Loc      Pain Edu?      Excl. in [place]?     Most recent vital signs: Vitals:   04/27/22 1300 04/27/22 1330  BP:    Pulse: 63 (!) 59  Resp: 20 19  Temp:    SpO2: 99% 100%     General: Awake, no distress.  CV:  Good peripheral perfusion.  Heart regular rate and rhythm he has a 3 out of 6 crescendo decrescendo murmur heard throughout the precordium Resp:  Normal effort.  No rubs are heard lungs are clear Chest is nontender to palpation Abd:  No distention.  Soft nontender to palpation patient has diastases recti Extremities: No edema   ED Results / Procedures / Treatments   Labs (all labs ordered are listed, but only abnormal results are displayed) Labs Reviewed  COMPREHENSIVE METABOLIC PANEL - Abnormal; Notable  for the following components:      Result Value   Glucose, Bld 175 (*)    Calcium 7.8 (*)    Total Protein 6.4 (*)    Albumin 3.4 (*)    All other components within normal limits  CBC WITH DIFFERENTIAL/PLATELET - Abnormal; Notable for the following components:   RBC 3.43 (*)    Hemoglobin 10.1 (*)    HCT 33.3 (*)    All other components within normal limits  D-DIMER, QUANTITATIVE - Abnormal; Notable for the following components:   D-Dimer, Quant 1.18 (*)    All other components within normal limits  URINALYSIS, ROUTINE W REFLEX MICROSCOPIC - Abnormal; Notable for the following components:   Color, Urine YELLOW (*)    APPearance CLEAR (*)    Specific Gravity, Urine 1.039 (*)    Glucose, UA >=500 (*)    Ketones, ur 5 (*)    All other components within normal limits  BRAIN NATRIURETIC PEPTIDE - Abnormal; Notable for the following components:   B Natriuretic Peptide 156.4 (*)    All  other components within normal limits  PROCALCITONIN  TROPONIN I (HIGH SENSITIVITY)  TROPONIN I (HIGH SENSITIVITY)     EKG  EKG read interpreted by me shows normal sinus rhythm rate of 66 PR interval of 252 ms patient has right bundle branch block computer is also reading left anterior hemiblock.  EKG looks similar to 1 from 10 2 of this year.   RADIOLOGY Wrist x-ray read by radiology reviewed and interpreted by me shows a left lung base infiltrate radiology feels is suspicious for pneumonia.  I agree about the infiltrate.  However the patient's symptoms are on the right side.  Patient does not have any pain on the left side he has no hypoxia no fever no cough and no white count.  I will add a procalcitonin and will get a chest CT angio since his D-dimer is up in the pleuritic pain is on the right side. CT abdomen is essentially negative.  They comment on the lungs and states is chronic changes.  PROCEDURES:  Critical Care performed:   Procedures   MEDICATIONS ORDERED IN ED: Medications   iohexol (OMNIPAQUE) 350 MG/ML injection 75 mL (75 mLs Intravenous Contrast Given 04/27/22 1143)     IMPRESSION / MDM / ASSESSMENT AND PLAN / ED COURSE  I reviewed the triage vital signs and the nursing notes. Patient with history of right-sided pleuritic pain there is nothing that can be seen on the right side.  LFTs troponin etc. are all normal.  No sign of any rib fractures.  Patient apparently had a history of rib fractures in the past family has mentioned that.  Patient's abdomen was slightly tender here after he had been here for some time.  CT of the abdomen shows nothing there either.  His lab work procalcitonin etc. are all normal.  BNP is minimally elevated but he is not short of breath there is no other evidence of CHF and the second CT which got the lung bases does not show any CHF he is not hypoxic.  I am not sure what causes abdominal pain but there is nothing going on now.  I will let him go follow-up with his doctor and have him return for any worsening  Differential diagnosis includes, but is not limited to, bowel obstruction cholecystitis pneumonia pleurisy PE are all in the differential but none of them can be found.  Patient's presentation is most consistent with acute complicated illness / injury requiring diagnostic workup.  The patient is on the cardiac monitor to evaluate for evidence of arrhythmia and/or significant heart rate changes.  None have been seen   Patient is not complaining of much pain now the pain that I found with palpation seems to have improved although it still present at the only very mild.  He has no further right-sided pleuritic chest pain.  FINAL CLINICAL IMPRESSION(S) / ED DIAGNOSES   Final diagnoses:  Pleuritic chest pain  Left upper quadrant abdominal pain     Rx / DC Orders   ED Discharge Orders     None        Note:  This document was prepared using Dragon voice recognition software and may include unintentional dictation errors.    Nena Polio, MD 04/27/22 1407

## 2022-07-28 ENCOUNTER — Encounter: Payer: Self-pay | Admitting: Emergency Medicine

## 2022-07-28 ENCOUNTER — Emergency Department: Payer: Medicare Other

## 2022-07-28 ENCOUNTER — Inpatient Hospital Stay
Admission: EM | Admit: 2022-07-28 | Discharge: 2022-08-04 | DRG: 291 | Disposition: A | Discharge status: Nursing Home (Includes ICF) | Payer: Medicare Other | Source: Skilled Nursing Facility | Attending: Internal Medicine | Admitting: Internal Medicine

## 2022-07-28 DIAGNOSIS — I2489 Other forms of acute ischemic heart disease: Secondary | ICD-10-CM | POA: Diagnosis present

## 2022-07-28 DIAGNOSIS — I5A Non-ischemic myocardial injury (non-traumatic): Secondary | ICD-10-CM | POA: Diagnosis present

## 2022-07-28 DIAGNOSIS — Z7901 Long term (current) use of anticoagulants: Secondary | ICD-10-CM | POA: Diagnosis not present

## 2022-07-28 DIAGNOSIS — M810 Age-related osteoporosis without current pathological fracture: Secondary | ICD-10-CM | POA: Diagnosis present

## 2022-07-28 DIAGNOSIS — I4819 Other persistent atrial fibrillation: Secondary | ICD-10-CM | POA: Diagnosis present

## 2022-07-28 DIAGNOSIS — E1122 Type 2 diabetes mellitus with diabetic chronic kidney disease: Secondary | ICD-10-CM | POA: Diagnosis present

## 2022-07-28 DIAGNOSIS — Z8616 Personal history of COVID-19: Secondary | ICD-10-CM

## 2022-07-28 DIAGNOSIS — I5031 Acute diastolic (congestive) heart failure: Secondary | ICD-10-CM | POA: Diagnosis present

## 2022-07-28 DIAGNOSIS — Z66 Do not resuscitate: Secondary | ICD-10-CM | POA: Diagnosis present

## 2022-07-28 DIAGNOSIS — R29818 Other symptoms and signs involving the nervous system: Secondary | ICD-10-CM | POA: Diagnosis not present

## 2022-07-28 DIAGNOSIS — I25110 Atherosclerotic heart disease of native coronary artery with unstable angina pectoris: Secondary | ICD-10-CM | POA: Diagnosis present

## 2022-07-28 DIAGNOSIS — N189 Chronic kidney disease, unspecified: Secondary | ICD-10-CM | POA: Diagnosis present

## 2022-07-28 DIAGNOSIS — Z8673 Personal history of transient ischemic attack (TIA), and cerebral infarction without residual deficits: Secondary | ICD-10-CM

## 2022-07-28 DIAGNOSIS — R5381 Other malaise: Secondary | ICD-10-CM | POA: Diagnosis not present

## 2022-07-28 DIAGNOSIS — N179 Acute kidney failure, unspecified: Secondary | ICD-10-CM | POA: Diagnosis present

## 2022-07-28 DIAGNOSIS — Z993 Dependence on wheelchair: Secondary | ICD-10-CM | POA: Diagnosis not present

## 2022-07-28 DIAGNOSIS — E785 Hyperlipidemia, unspecified: Secondary | ICD-10-CM | POA: Diagnosis not present

## 2022-07-28 DIAGNOSIS — I480 Paroxysmal atrial fibrillation: Secondary | ICD-10-CM | POA: Diagnosis not present

## 2022-07-28 DIAGNOSIS — E1169 Type 2 diabetes mellitus with other specified complication: Secondary | ICD-10-CM | POA: Diagnosis not present

## 2022-07-28 DIAGNOSIS — T502X5A Adverse effect of carbonic-anhydrase inhibitors, benzothiadiazides and other diuretics, initial encounter: Secondary | ICD-10-CM | POA: Diagnosis present

## 2022-07-28 DIAGNOSIS — I440 Atrioventricular block, first degree: Secondary | ICD-10-CM | POA: Diagnosis present

## 2022-07-28 DIAGNOSIS — R531 Weakness: Secondary | ICD-10-CM | POA: Diagnosis not present

## 2022-07-28 DIAGNOSIS — E871 Hypo-osmolality and hyponatremia: Secondary | ICD-10-CM | POA: Diagnosis not present

## 2022-07-28 DIAGNOSIS — Z823 Family history of stroke: Secondary | ICD-10-CM

## 2022-07-28 DIAGNOSIS — R079 Chest pain, unspecified: Secondary | ICD-10-CM | POA: Diagnosis not present

## 2022-07-28 DIAGNOSIS — E1151 Type 2 diabetes mellitus with diabetic peripheral angiopathy without gangrene: Secondary | ICD-10-CM | POA: Diagnosis present

## 2022-07-28 DIAGNOSIS — I4510 Unspecified right bundle-branch block: Secondary | ICD-10-CM | POA: Diagnosis present

## 2022-07-28 DIAGNOSIS — Z8249 Family history of ischemic heart disease and other diseases of the circulatory system: Secondary | ICD-10-CM | POA: Diagnosis not present

## 2022-07-28 DIAGNOSIS — E876 Hypokalemia: Secondary | ICD-10-CM | POA: Diagnosis not present

## 2022-07-28 DIAGNOSIS — I350 Nonrheumatic aortic (valve) stenosis: Secondary | ICD-10-CM | POA: Diagnosis present

## 2022-07-28 DIAGNOSIS — Z833 Family history of diabetes mellitus: Secondary | ICD-10-CM

## 2022-07-28 DIAGNOSIS — D649 Anemia, unspecified: Secondary | ICD-10-CM | POA: Diagnosis present

## 2022-07-28 DIAGNOSIS — G20A1 Parkinson's disease without dyskinesia, without mention of fluctuations: Secondary | ICD-10-CM | POA: Diagnosis present

## 2022-07-28 DIAGNOSIS — E782 Mixed hyperlipidemia: Secondary | ICD-10-CM | POA: Diagnosis present

## 2022-07-28 DIAGNOSIS — Z7189 Other specified counseling: Secondary | ICD-10-CM | POA: Diagnosis not present

## 2022-07-28 DIAGNOSIS — J810 Acute pulmonary edema: Secondary | ICD-10-CM | POA: Diagnosis present

## 2022-07-28 DIAGNOSIS — Z85828 Personal history of other malignant neoplasm of skin: Secondary | ICD-10-CM | POA: Diagnosis not present

## 2022-07-28 DIAGNOSIS — Z79899 Other long term (current) drug therapy: Secondary | ICD-10-CM

## 2022-07-28 DIAGNOSIS — I5033 Acute on chronic diastolic (congestive) heart failure: Secondary | ICD-10-CM | POA: Diagnosis present

## 2022-07-28 DIAGNOSIS — Z7984 Long term (current) use of oral hypoglycemic drugs: Secondary | ICD-10-CM

## 2022-07-28 DIAGNOSIS — I130 Hypertensive heart and chronic kidney disease with heart failure and stage 1 through stage 4 chronic kidney disease, or unspecified chronic kidney disease: Secondary | ICD-10-CM | POA: Diagnosis not present

## 2022-07-28 DIAGNOSIS — G20C Parkinsonism, unspecified: Secondary | ICD-10-CM | POA: Diagnosis present

## 2022-07-28 HISTORY — DX: Atherosclerotic heart disease of native coronary artery without angina pectoris: I25.10

## 2022-07-28 HISTORY — DX: Nonrheumatic aortic (valve) stenosis: I35.0

## 2022-07-28 LAB — CBC
HCT: 31.5 % — ABNORMAL LOW (ref 39.0–52.0)
Hemoglobin: 9.7 g/dL — ABNORMAL LOW (ref 13.0–17.0)
MCH: 29 pg (ref 26.0–34.0)
MCHC: 30.8 g/dL (ref 30.0–36.0)
MCV: 94 fL (ref 80.0–100.0)
Platelets: 204 10*3/uL (ref 150–400)
RBC: 3.35 MIL/uL — ABNORMAL LOW (ref 4.22–5.81)
RDW: 15.7 % — ABNORMAL HIGH (ref 11.5–15.5)
WBC: 9.2 10*3/uL (ref 4.0–10.5)
nRBC: 0 % (ref 0.0–0.2)

## 2022-07-28 LAB — CBG MONITORING, ED
Glucose-Capillary: 202 mg/dL — ABNORMAL HIGH (ref 70–99)
Glucose-Capillary: 229 mg/dL — ABNORMAL HIGH (ref 70–99)
Glucose-Capillary: 134 mg/dL — ABNORMAL HIGH (ref 70–99)
Glucose-Capillary: 149 mg/dL — ABNORMAL HIGH (ref 70–99)

## 2022-07-28 LAB — BASIC METABOLIC PANEL
Anion gap: 10 (ref 5–15)
BUN: 21 mg/dL (ref 8–23)
CO2: 21 mmol/L — ABNORMAL LOW (ref 22–32)
Calcium: 8.4 mg/dL — ABNORMAL LOW (ref 8.9–10.3)
Chloride: 104 mmol/L (ref 98–111)
Creatinine, Ser: 1.35 mg/dL — ABNORMAL HIGH (ref 0.61–1.24)
GFR, Estimated: 50 mL/min — ABNORMAL LOW (ref >60)
Glucose, Bld: 181 mg/dL — ABNORMAL HIGH (ref 70–99)
Potassium: 3.7 mmol/L (ref 3.5–5.1)
Sodium: 135 mmol/L (ref 135–145)

## 2022-07-28 LAB — TROPONIN I (HIGH SENSITIVITY)
Troponin I (High Sensitivity): 49 ng/L — ABNORMAL HIGH (ref <18)
Troponin I (High Sensitivity): 60 ng/L — ABNORMAL HIGH (ref <18)
Troponin I (High Sensitivity): 166 ng/L (ref <18)
Troponin I (High Sensitivity): 211 ng/L (ref <18)
Troponin I (High Sensitivity): 372 ng/L (ref <18)
Troponin I (High Sensitivity): 499 ng/L (ref <18)

## 2022-07-28 LAB — HEMOGLOBIN A1C
Hgb A1c MFr Bld: 7.7 % — ABNORMAL HIGH (ref 4.8–5.6)
Mean Plasma Glucose: 174.29 mg/dL

## 2022-07-28 LAB — BRAIN NATRIURETIC PEPTIDE: B Natriuretic Peptide: 904.2 pg/mL — ABNORMAL HIGH (ref 0.0–100.0)

## 2022-07-28 MED ORDER — ROSUVASTATIN CALCIUM 10 MG PO TABS
10.0000 mg | ORAL_TABLET | Freq: Every day | ORAL | Status: DC
  Administered 2022-07-28 – 2022-08-04 (×8): 10 mg via ORAL
  Filled 2022-07-28 (×8): qty 1

## 2022-07-28 MED ORDER — ONDANSETRON HCL 4 MG/2ML IJ SOLN
4.0000 mg | Freq: Four times a day (QID) | INTRAMUSCULAR | Status: DC | PRN
  Filled 2022-07-28: qty 2

## 2022-07-28 MED ORDER — FUROSEMIDE 10 MG/ML IJ SOLN: 40.0000 mg | Freq: Every day | INTRAMUSCULAR | Status: DC

## 2022-07-28 MED ORDER — APIXABAN 5 MG PO TABS
5.0000 mg | ORAL_TABLET | Freq: Two times a day (BID) | ORAL | Status: DC
  Administered 2022-07-28 – 2022-08-04 (×15): 5 mg via ORAL
  Filled 2022-07-28 (×15): qty 1

## 2022-07-28 MED ORDER — FUROSEMIDE 10 MG/ML IJ SOLN: 20.0000 mg | Freq: Every day | INTRAMUSCULAR | Status: DC

## 2022-07-28 MED ORDER — INSULIN ASPART 100 UNIT/ML IJ SOLN
0.0000 [IU] | Freq: Three times a day (TID) | INTRAMUSCULAR | Status: DC
  Administered 2022-07-28: 5 [IU] via SUBCUTANEOUS
  Administered 2022-07-28: 2 [IU] via SUBCUTANEOUS
  Administered 2022-07-29: 5 [IU] via SUBCUTANEOUS
  Administered 2022-07-29: 2 [IU] via SUBCUTANEOUS
  Administered 2022-07-29: 3 [IU] via SUBCUTANEOUS
  Administered 2022-07-30: 2 [IU] via SUBCUTANEOUS
  Administered 2022-07-30 (×2): 5 [IU] via SUBCUTANEOUS
  Administered 2022-07-31 (×2): 11 [IU] via SUBCUTANEOUS
  Administered 2022-07-31 – 2022-08-01 (×2): 3 [IU] via SUBCUTANEOUS
  Administered 2022-08-01 (×2): 11 [IU] via SUBCUTANEOUS
  Filled 2022-07-28 (×14): qty 1

## 2022-07-28 MED ORDER — FLECAINIDE ACETATE 50 MG PO TABS
50.0000 mg | ORAL_TABLET | Freq: Two times a day (BID) | ORAL | Status: DC
  Filled 2022-07-28: qty 1

## 2022-07-28 MED ORDER — CALCIUM CARBONATE 1250 (500 CA) MG PO TABS
1.0000 | ORAL_TABLET | Freq: Every day | ORAL | Status: DC
  Administered 2022-07-29 – 2022-08-04 (×7): 1250 mg via ORAL
  Filled 2022-07-28 (×8): qty 1

## 2022-07-28 MED ORDER — ONDANSETRON HCL 4 MG PO TABS
4.0000 mg | ORAL_TABLET | Freq: Four times a day (QID) | ORAL | Status: DC | PRN
  Filled 2022-07-28: qty 1

## 2022-07-28 MED ORDER — VITAMIN D 25 MCG (1000 UNIT) PO TABS
1000.0000 [IU] | ORAL_TABLET | Freq: Every day | ORAL | Status: DC
  Administered 2022-07-29 – 2022-08-04 (×7): 1000 [IU] via ORAL
  Filled 2022-07-28 (×7): qty 1

## 2022-07-28 MED ORDER — ADULT MULTIVITAMIN W/MINERALS CH
1.0000 | ORAL_TABLET | Freq: Every day | ORAL | Status: DC
  Administered 2022-07-28 – 2022-08-04 (×8): 1 via ORAL
  Filled 2022-07-28 (×8): qty 1

## 2022-07-28 MED ORDER — SERTRALINE HCL 50 MG PO TABS
50.0000 mg | ORAL_TABLET | Freq: Every day | ORAL | Status: DC
  Administered 2022-07-28 – 2022-08-04 (×8): 50 mg via ORAL
  Filled 2022-07-28 (×8): qty 1

## 2022-07-28 MED ORDER — PANTOPRAZOLE SODIUM 40 MG PO TBEC
40.0000 mg | DELAYED_RELEASE_TABLET | Freq: Two times a day (BID) | ORAL | Status: DC
  Administered 2022-07-28 – 2022-08-04 (×15): 40 mg via ORAL
  Filled 2022-07-28 (×15): qty 1

## 2022-07-28 MED ORDER — METOPROLOL SUCCINATE ER 50 MG PO TB24
50.0000 mg | ORAL_TABLET | Freq: Every day | ORAL | Status: DC
  Administered 2022-07-28 – 2022-07-29 (×2): 50 mg via ORAL
  Filled 2022-07-28 (×2): qty 1

## 2022-07-28 MED ORDER — FUROSEMIDE 10 MG/ML IJ SOLN
20.0000 mg | Freq: Once | INTRAMUSCULAR | Status: AC
  Administered 2022-07-28: 20 mg via INTRAVENOUS
  Filled 2022-07-28: qty 4

## 2022-07-28 MED ORDER — CARBIDOPA-LEVODOPA 25-100 MG PO TABS: 3.0000 | ORAL_TABLET | Freq: Four times a day (QID) | ORAL | Status: DC

## 2022-07-28 MED ORDER — FUROSEMIDE 10 MG/ML IJ SOLN
20.0000 mg | Freq: Two times a day (BID) | INTRAMUSCULAR | Status: DC
  Administered 2022-07-28 – 2022-07-29 (×2): 20 mg via INTRAVENOUS
  Filled 2022-07-28 (×2): qty 4

## 2022-07-28 MED ORDER — DOCUSATE SODIUM 100 MG PO CAPS
100.0000 mg | ORAL_CAPSULE | Freq: Every day | ORAL | Status: DC
  Administered 2022-07-28 – 2022-08-04 (×7): 100 mg via ORAL
  Filled 2022-07-28 (×8): qty 1

## 2022-07-28 MED ORDER — CALCIUM-VITAMIN D-VITAMIN K 500-1000-40 MG-UNT-MCG PO CHEW: CHEWABLE_TABLET | Freq: Every day | ORAL | Status: DC

## 2022-07-28 MED ORDER — ACETAMINOPHEN 325 MG PO TABS
650.0000 mg | ORAL_TABLET | Freq: Four times a day (QID) | ORAL | Status: DC | PRN
  Administered 2022-08-01 – 2022-08-03 (×4): 650 mg via ORAL
  Filled 2022-07-28 (×4): qty 2

## 2022-07-28 NOTE — Assessment & Plan Note (Addendum)
Continue Toprol for rate control and Eliquis for anticoagulation.  Flecainide discontinued by cardiology with heart failure.

## 2022-07-28 NOTE — Assessment & Plan Note (Addendum)
With history of severe aortic stenosis.  Mortality is high with in the next year.  Treated with IV Lasix, transitioned to PO Lasix 40 mg BID.  Appears he was not on diuretics PTA.  Sodium trending down, might be getting hypovolemic. --Reduce Lasix to 40 mg daily & consider 20 mg daily with extra 20 mg PRN dose. --Continue Toprol.   --Echo completed, results below

## 2022-07-28 NOTE — ED Notes (Signed)
Pt on call light endorsing "I need to shit". Pt placed on bedpan at this time and instructed to use call light when finished. WCTM.

## 2022-07-28 NOTE — H&P (Signed)
History and Physical    Patient: Timothy [name] WFU:[id number] DOB: 1929-05-21 DOA: 07/28/2022 DOS: the patient was seen and examined on 07/28/2022 PCP: [facility]  Patient coming from: ALF/ILF  Chief Complaint:  Chief Complaint  Patient presents with   Chest Pain    Most of the history was obtained from patient's daughter at the bedside. HPI: Timothy [name] is a 89 y.o. male with medical history significant for hypertension, diabetes mellitus type 2 on oral agents, paroxysmal A-fib on Eliquis, chronic diastolic dysfunction CHF, peripheral arterial disease, Parkinson's disease, severe aortic stenosis (aortic valve area by VTI measures 0.87 cm2.  Aortic valve mean gradient measures 40.0 mmHg) seen and evaluated by cardiac surgery and deemed not a surgical candidate due to advanced Parkinson's disease, advanced age and decreased functional status who presents to the ER via EMS for evaluation of chest pain and worsening shortness of breath. Patient's daughter notes that he has had worsening shortness of breath over several days and this was noted by his caregiver who observed that with minimal exertion patient was out of breath and needed time to get back to his baseline.  He also complains of midsternal chest pain with radiation to his left arm that he has had intermittently for 3 days.  Denies having any nausea, no vomiting, no diaphoresis or palpitations and chest pain improved after he received aspirin by EMS. Notes that shortness of breath occurs with both rest and exertion and he is unable to lay flat in bed.  He does have some mild lower extremity swelling. Daughter notes that he has episodes of syncope/near syncope mostly when having bowel movements and this happens regardless of whether he has constipation or diarrhea.  Patient denies straining to have bowel movements. He is mostly wheelchair-bound and will sometimes use a rolling walker to get around his apartment He denies  having any cough, no fever, no chills, no abdominal pain, no dizziness, no lightheadedness, no urinary symptoms, no blurred vision no focal deficit. Abnormal labs include uptrending troponin 49 >> 60, BNP 904 Chest x-ray reviewed by me shows findings suggestive of mild CHF Twelve-lead EKG reviewed by me shows sinus rhythm with first-degree AV block.  Left axis deviation and a right bundle branch block. Patient received a dose of Lasix 40 mg IV and will be admitted to the hospital for further evaluation.   Review of Systems: As mentioned in the history of present illness. All other systems reviewed and are negative. Past Medical History:  Diagnosis Date   (HFpEF) heart failure with preserved ejection fraction ([place])    a. 05/2019 Echo: EF 65-70%, mod LVH. Gr2 DD. Mod AS.   AAA (abdominal aortic aneurysm) ([place])    a. 2014 Abd U/S: 3.5cm AAA; b. 10/20189 Abd U/S: 3.3cm AAA; c. 01/2018 CT Abd/Pelvis: 2.7cm infrarenal AAA - rec f/u U/S in 5 yrs.   Actinic keratosis    Back pain    Basal cell carcinoma 08/22/2019   Left mid ear helix. Nodular pattern, not treated pt was sched for [facility] 09/12/19 and canceled notes in [place], re bx on 03/11/22 bx proven BCC, Mohs 03/26/2022   Basal cell carcinoma 04/30/2020   Left cheek. Superficial and nodular patterns. excised   Esophageal reflux    Essential hypertension    History of stress test    a. 01/2012 MV: EF 63%, no rwma. No ischemia/infarct. Low risk.   Mixed hyperlipidemia    Moderate aortic stenosis    a. 05/2019 Echo: Mod  AS. Mean grad 29.5 mmHg. Peak grad 55.7 mmHg. AVA 1.28 cm^2 (VTI). No significant change from 2019 echo.   Osteitis deformans without mention of bone tumor    Paget's Disease   PAD (peripheral artery disease) ([place])    a. 12/2012 and 05/2013 s/p bilat LE bypass @ [facility] in setting of bilat popliteal aneurysms; b. 05/2019 LE Duplex: R PT 50-74%, Nl L Leg.   Parkinson's disease    Persistent atrial fibrillation ([place])    a. 02/2015 Event  monitor: long periods of Afib--prev on flecainide; b. CHA2DS2VASc = 8-->Eliquis 5 bid.   Retinal detachment    Senile osteoporosis    Skin cancer    Squamous cell carcinoma of skin 04/02/2017   Left cheek. SCCis. Referred for Moh's, clear at appt 06/18/17. Pt opted to treat with 5FU over Mohs.   Swelling of right lower extremity    a. 05/2019 LE U/S: No DVT. 50-64% R PT artery stenosis.   TIA (transient ischemic attack)    Type II diabetes mellitus ([place])    Past Surgical History:  Procedure Laterality Date   APPENDECTOMY     BACK SURGERY     CATARACT EXTRACTION     COLONOSCOPY  2006,2009   ESOPHAGEAL DILATION     GALLBLADDER SURGERY     HERNIA REPAIR     Lower extremity arterial bypass Bilateral    RETINAL DETACHMENT SURGERY     TONSILLECTOMY     Social History:  reports that he has never smoked. He has never used smokeless tobacco. He reports that he does not drink alcohol and does not use drugs.  No Known Allergies  Family History  Problem Relation Age of Onset   Heart disease Mother    Diabetes Father    Retinal detachment Brother    Heart disease Brother    Stroke Brother    Diabetes Other        grandparents    Prior to Admission medications   Medication Sig Start Date End Date Taking? Authorizing Provider  acetaminophen (TYLENOL) 325 MG tablet Take 2 tablets (650 mg total) by mouth every 6 (six) hours as needed for mild pain or headache (fever >/= 101). 09/22/19   Val Riles, MD  apixaban (ELIQUIS) 5 MG TABS tablet Take 1 tablet (5 mg total) by mouth 2 (two) times daily. 04/07/22   [name], MD  Calcium Citrate-Vitamin D (CALCIUM + D PO) Take 1 tablet by mouth daily.     [provider]  carbidopa-levodopa (SINEMET IR) 25-100 MG tablet Take 3 tablets by mouth 4 (four) times daily.    [provider]  cyanocobalamin (,VITAMIN B-12,) 1000 MCG/ML injection INJECT 1ML ONCE A WEEK FOR 14 DAYS (2 WEEKS)  and  THEN RETURN TO MONTHLY FREQUENCY  06/21/20   [provider]  famotidine (PEPCID) 20 MG tablet Take 20 mg by mouth at bedtime. 06/07/20   [provider]  FARXIGA 10 MG TABS tablet TAKE 1 TABLET BY MOUTH DAILY 05/07/20   Cannady, Henrine Screws T, NP  flecainide (TAMBOCOR) 50 MG tablet Take 1 tablet (50 mg total) by mouth 2 (two) times daily. 04/07/22   [name], MD  glipiZIDE-metformin (METAGLIP) 5-500 MG tablet TAKE 1 TABLET BY MOUTH TWICE DAILY 08/09/20   Cannady, Henrine Screws T, NP  GLUCOSAMINE-CHONDROITIN PO Take by mouth daily.    [provider]  hydrocortisone 2.5 % cream Apply 1-2 times daily to areas at neck, face, scalp as needed for itch. 04/30/20  Brendolyn Patty, MD  JANUVIA 100 MG tablet TAKE 1 TABLET BY MOUTH DAILY 05/07/20   Cannady, Henrine Screws T, NP  ketoconazole (NIZORAL) 2 % cream once daily to pink scaly patches at face, ears and scalp. 02/05/21   Brendolyn Patty, MD  metoprolol succinate (TOPROL-XL) 50 MG 24 hr tablet Take 1 tablet (50 mg total) by mouth daily. Take with or immediately following a meal. 04/07/22   Gollan, Kathlene November, MD  Multiple Vitamins-Minerals (CEROVITE SENIOR PO) Take 1 tablet by mouth daily.    [provider]  pantoprazole (PROTONIX) 40 MG tablet TAKE 1 TABLET BY MOUTH TWICE DAILY 03/09/20   Cannady, Jolene T, NP  rosuvastatin (CRESTOR) 10 MG tablet Take 1 tablet (10 mg total) by mouth daily. 04/07/22   [name], MD  sertraline (ZOLOFT) 50 MG tablet Take 1 tablet (50 mg total) by mouth daily. 11/17/19   Cannady, Henrine Screws T, NP  STOOL SOFTENER 100 MG capsule Take 100 mg by mouth daily. 10/12/20   [provider]  traMADol (ULTRAM) 50 MG tablet Take 1 tablet (50 mg total) by mouth every 6 (six) hours as needed. 05/08/21   Harvest Dark, MD  UNABLE TO FIND Take 100 mg by mouth daily. CBD '100mg'$     [provider]  [place] Name: [place]    [provider]    Physical Exam: Vitals:   07/28/22 0723 07/28/22 0800  07/28/22 0830 07/28/22 0845  BP:  103/65 112/70   Pulse: 74 81 89   Resp: (!) 22 19 (!) 35   Temp:    98 F (36.7 C)  TempSrc:    Oral  SpO2: 93% 92% 94%   Weight:      Height:       Physical Exam Vitals and nursing note reviewed.  Constitutional:      Appearance: He is well-developed.     Comments: Has conversational dyspnea  HENT:     Head: Normocephalic and atraumatic.  Eyes:     Pupils: Pupils are equal, round, and reactive to light.  Cardiovascular:     Rate and Rhythm: Normal rate and regular rhythm.     Heart sounds: Murmur heard.     Systolic murmur is present.  Pulmonary:     Effort: Tachypnea present.     Breath sounds: Examination of the right-lower field reveals rales. Examination of the left-lower field reveals rales. Rales present.  Abdominal:     General: Bowel sounds are normal.     Palpations: Abdomen is soft.  Musculoskeletal:     Cervical back: Normal range of motion and neck supple.     Right lower leg: Edema present.     Left lower leg: Edema present.  Skin:    General: Skin is warm and dry.  Neurological:     Mental Status: He is alert and oriented to person, place, and time.     Motor: Weakness present.  Psychiatric:        Mood and Affect: Mood normal.        Behavior: Behavior normal.     Data Reviewed: Relevant notes from primary care and specialist visits, past discharge summaries as available in EHR, including Care Everywhere. Prior diagnostic testing as pertinent to current admission diagnoses Updated medications and problem lists for reconciliation ED course, including vitals, labs, imaging, treatment and response to treatment Triage notes, nursing and pharmacy notes and ED provider's notes Notable results as noted in HPI Labs reviewed.  BNP 904, troponin 49 >> 60, sodium 135, potassium 3.7, chloride 104, bicarb 21, glucose 181, BUN 21, creatinine 1.35 compared to baseline of 1.01, calcium 8.4, white count 9.2, hemoglobin 9.7,  hematocrit 31.5, platelet count 204 There are no new results to review at this time.  Assessment and Plan: * Acute diastolic CHF (congestive heart failure) (HCC) Last known LVEF of 65 to 70% with evidence of severe aortic valve stenosis from a 2D echocardiogram which was done 02/22 Patient with complaints of shortness of breath at rest, orthopnea and lower extremity swelling BNP is elevated and chest x-ray shows findings consistent with acute CHF We will place patient on Lasix 20 mg IV daily Continue metoprolol Repeat 2D echocardiogram to assess LVEF and also assess aortic valve area and mean gradient Will consult cardiology  Symptomatic severe aortic stenosis with normal ejection fraction Patient noted to have severe aortic stenosis from a 2D echocardiogram which was done 02/22. Symptoms include syncopal episodes, worsening shortness of breath and chest pain Aortic valve area by VTI measures 0.87cm2 and aortic valve mean gradient measured 40 mmHg He was evaluated by cardiac surgery and not a candidate for TAVR due to poor functional status, advanced age and Parkinson's disease We will consult palliative care  Chest pain at rest Patient with complaints of chest pain mostly midsternal with radiation to the left shoulder Noted to have an uptrending troponin level Will continue to cycle cardiac enzymes Continue metoprolol, statin and apixaban Repeat 2D echocardiogram to assess LVEF and rule out regional wall motion abnormality  Type 2 diabetes mellitus without complication, without long-term current use of insulin (HCC) Hold oral hypoglycemic agents Maintain consistent carbohydrate diet Glycemic control with sliding scale insulin  Parkinson disease Continue Sinemet  AF (paroxysmal atrial fibrillation) (HCC) Continue metoprolol and flecainide for rate control Continue Eliquis as primary prophylaxis for an acute stroke      Advance Care Planning:   Code Status: DNR   Consults:  Cardiology, palliative care  Family Communication: Greater than 50% of time was spent discussing plan of care with patient and his daughter at the bedside.  All questions and concerns have been addressed.  They verbalized understanding and agree with the plan.  CODE STATUS was discussed and patient wishes to be DO NOT RESUSCITATE  Severity of Illness: The appropriate patient status for this patient is INPATIENT. Inpatient status is judged to be reasonable and necessary in order to provide the required intensity of service to ensure the patient's safety. The patient's presenting symptoms, physical exam findings, and initial radiographic and laboratory data in the context of their chronic comorbidities is felt to place them at high risk for further clinical deterioration. Furthermore, it is not anticipated that the patient will be medically stable for discharge from the hospital within 2 midnights of admission.   * I certify that at the point of admission it is my clinical judgment that the patient will require inpatient hospital care spanning beyond 2 midnights from the point of admission due to high intensity of service, high risk for further deterioration and high frequency of surveillance required.*  Author: Collier Bullock, MD 07/28/2022 10:10 AM  For on call review [email or web address].

## 2022-07-28 NOTE — ED Notes (Signed)
Notified MD of trop 166.

## 2022-07-28 NOTE — ED Notes (Signed)
This RN to bedside for repeat blood draw per MD.  Pt in stretcher, locked in lowest position with wife at bedside.  Room air, alert and oriented in NAD.

## 2022-07-28 NOTE — ED Notes (Signed)
Bedpan removed, no BM at this time. WCTM.

## 2022-07-28 NOTE — Assessment & Plan Note (Signed)
Continue Sinemet ?

## 2022-07-28 NOTE — Assessment & Plan Note (Addendum)
Resolved. Pt had Right-sided chest pain.  Could be shingles without a rash.  Continue to monitor if a rash develops.  X-ray showing old fractures of the seventh and eighth lateral ribs.  Unlikely pulmonary embolism with being on Eliquis. Negative M spike on SPEP --Topical lidocaine or voltaren gell, whichever is more helpful

## 2022-07-28 NOTE — Assessment & Plan Note (Addendum)
Repeat echo pending.

## 2022-07-28 NOTE — ED Notes (Addendum)
Pt called out stating that he feels wet.  Bed sheet saturated. This RN changed sheet, provided peri care and applied new purewick.  Pt was able to help turn himself and tolerated well.

## 2022-07-28 NOTE — Assessment & Plan Note (Addendum)
Last hemoglobin A1c 7.7.   On sliding scale.   Resume oral hypoglycemics (Farxiga and Tradjent sub for home [place])

## 2022-07-28 NOTE — ED Provider Notes (Signed)
[facility] Provider Note    Event Date/Time   First MD Initiated Contact with Patient 07/28/22 [phone]     (approximate)   History   Chest Pain   HPI  Timothy [name] is a 89 y.o. male with past medical history significant for hypertension, hyperlipidemia, diabetes, severe aortic stenosis (not a TAVR candidate), atrial fibrillation on Eliquis, presents to the emergency department with chest pain and shortness of breath.  Chest pain and shortness of breath that started this morning.  Feels like he cannot catch his breath and is worse laying down.  Chest pain improved with aspirin given with EMS.  Currently chest pain-free.  Known severe aortic stenosis and has been told that he was not a TAVR candidate in the past.  Followed by cardiology, Dr. Rockey Situ.  Denies any melena.     Physical Exam   Triage Vital Signs: ED Triage Vitals  Enc Vitals Group     BP 07/28/22 0432 113/61     Pulse Rate 07/28/22 0432 90     Resp 07/28/22 0432 16     Temp 07/28/22 0432 97.6 F (36.4 C)     Temp Source 07/28/22 0432 Oral     SpO2 07/28/22 0432 95 %     Weight 07/28/22 0432 190 lb 0.6 oz (86.2 kg)     Height 07/28/22 0432 '5\' 8"'$  (1.727 m)     Head Circumference --      Peak Flow --      Pain Score 07/28/22 0435 3     Pain Loc --      Pain Edu? --      Excl. in [place]? --     Most recent vital signs: Vitals:   07/28/22 0723 07/28/22 0800  BP:  103/65  Pulse: 74 81  Resp: (!) 22 19  Temp:    SpO2: 93% 92%    Physical Exam Constitutional:      Appearance: He is well-developed.  HENT:     Head: Atraumatic.  Eyes:     Conjunctiva/sclera: Conjunctivae normal.  Cardiovascular:     Rate and Rhythm: Rhythm irregular.     Heart sounds: Murmur heard.     Systolic murmur is present.  Pulmonary:     Effort: Tachypnea and respiratory distress present.     Breath sounds: Examination of the right-lower field reveals rales. Examination of the left-lower field reveals rales.  Rales present.  Musculoskeletal:     Cervical back: Normal range of motion.     Right lower leg: Edema present.     Left lower leg: Edema present.     Comments: Trace pitting edema to bilateral lower extremities  Skin:    General: Skin is warm.     Capillary Refill: Capillary refill takes less than 2 seconds.  Neurological:     General: No focal deficit present.     Mental Status: He is alert. Mental status is at baseline.  Psychiatric:        Mood and Affect: Mood is anxious.     IMPRESSION / MDM / ASSESSMENT AND PLAN / ED COURSE  I reviewed the triage vital signs and the nursing notes.  89 year old male with a past medical history significant for known aortic stenosis that is severe, CAD, who presents to the emergency department with chest pain and shortness of breath.  On chart review of outside records patient has been followed by cardiology.  Has a known severe aortic stenosis and has  been told in the past with [facility] cardiology that he was not a good candidate for a TAVR.  Patient is on Eliquis for atrial fibrillation and took his last dose yesterday.  Differential diagnosis including progression of aortic stenosis, ACS, pneumonia, CHF.  Low suspicion for pulmonary embolism.   EKG  I, Nathaniel Man, the attending physician, personally viewed and interpreted this ECG.   Rate: Normal  Rhythm: Normal sinus  Axis: Normal  Intervals: Left bundle branch block  ST&T Change: Nonspecific and unchanged when compared to prior  No tachycardic or bradycardic dysrhythmias while on cardiac telemetry.  RADIOLOGY I independently reviewed imaging, my interpretation of imaging: Chest x-ray with pulmonary edema.  No focal findings consistent with pneumonia.  Read as pulmonary edema.  LABS (all labs ordered are listed, but only abnormal results are displayed) Labs interpreted as -  Elevated troponin and delta troponin increased to 60 from 49.  Anemia with a hemoglobin of 9.7 but  appears to be at his baseline.  No symptoms of a GI bleed.  Do not feel that he needs a blood transfusion at this time.  Adding on BNP  Labs Reviewed  BASIC METABOLIC PANEL - Abnormal; Notable for the following components:      Result Value   CO2 21 (*)    Glucose, Bld 181 (*)    Creatinine, Ser 1.35 (*)    Calcium 8.4 (*)    GFR, Estimated 50 (*)    All other components within normal limits  CBC - Abnormal; Notable for the following components:   RBC 3.35 (*)    Hemoglobin 9.7 (*)    HCT 31.5 (*)    RDW 15.7 (*)    All other components within normal limits  TROPONIN I (HIGH SENSITIVITY) - Abnormal; Notable for the following components:   Troponin I (High Sensitivity) 49 (*)    All other components within normal limits  TROPONIN I (HIGH SENSITIVITY) - Abnormal; Notable for the following components:   Troponin I (High Sensitivity) 60 (*)    All other components within normal limits  BRAIN NATRIURETIC PEPTIDE    TREATMENT   IV Lasix 20 mg.  Aspirin was given with EMS  On reevaluation patient is chest pain-free.  Last dose of Eliquis was last night.  Concern for pulmonary edema and respiratory distress in the setting of severe AV stenosis, not candidate for TAVR.    PROCEDURES:  Critical Care performed: yes  .Critical Care  Performed by: Nathaniel Man, MD Authorized by: Nathaniel Man, MD   Critical care provider statement:    Critical care time (minutes):  30   Critical care time was exclusive of:  Separately billable procedures and treating other patients   Critical care was necessary to treat or prevent imminent or life-threatening deterioration of the following conditions:  Cardiac failure   Critical care was time spent personally by me on the following activities:  Development of treatment plan with patient or surrogate, discussions with consultants, evaluation of patient's response to treatment, examination of patient, ordering and review of laboratory studies,  ordering and review of radiographic studies, ordering and performing treatments and interventions, pulse oximetry, re-evaluation of patient's condition and review of old charts   Patient's presentation is most consistent with acute presentation with potential threat to life or bodily function.   MEDICATIONS ORDERED IN ED: Medications  furosemide (LASIX) injection 20 mg (20 mg Intravenous Given 07/28/22 0758)    FINAL CLINICAL IMPRESSION(S) / ED DIAGNOSES   Final  diagnoses:  Chest pain, unspecified type  Acute pulmonary edema ([place])  Aortic valve stenosis, etiology of cardiac valve disease unspecified     Rx / DC Orders   ED Discharge Orders     None        Note:  This document was prepared using Dragon voice recognition software and may include unintentional dictation errors.   Nathaniel Man, MD 07/28/22 [phone]

## 2022-07-28 NOTE — ED Triage Notes (Signed)
Pt presents via [facility] from home with complaints of midsternal CP with radiation to the left arm that started 3 days ago. Pt received '324mg'$  ASA PTA with EMS and the pain resolved. Denies cough, congestion, fevers, N/V/D.   VS with EMS  125/86 90 HR 96% RA 20LFA

## 2022-07-28 NOTE — ED Notes (Signed)
[place] sent to the lab at this time.  

## 2022-07-28 NOTE — Consult Note (Signed)
Cardiology Consult    Patient ID: DAMEK ENDE MRN: [id number], DOB/AGE: [birth year]   Admit date: 07/28/2022 Date of Consult: 07/28/2022  Primary Physician: Orvis Brill, [facility] Primary Cardiologist: Ida Rogue, MD Requesting Provider: Milon Dikes, MD  Patient Profile    Timothy [name] is a 89 y.o. male with a history of severe inoperable aortic stenosis, chronic HFpEF, paroxysmal atrial fibrillation, abdominal aortic aneurysm, peripheral arterial disease, hyperlipidemia, diabetes, TIA, normocytic anemia, Parkinson's, and stage II-III chronic kidney disease who is being seen today for the evaluation of chest pain and presyncope at the request of Dr. Francine Graven.  Past Medical History   Past Medical History:  Diagnosis Date   (HFpEF) heart failure with preserved ejection fraction ([place])    a. 05/2019 Echo: EF 65-70%, mod LVH. Gr2 DD. Mod AS; b. 08/2020 Echo: EF 65-70%, mod LVH, GrI DD, nl RV fxn, severe AS.   AAA (abdominal aortic aneurysm) ([place])    a. 2014 Abd U/S: 3.5cm AAA; b. 10/20189 Abd U/S: 3.3cm AAA; c. 01/2018 CT Abd/Pelvis: 2.7cm infrarenal AAA - rec f/u U/S in 5 yrs.   Actinic keratosis    Back pain    Basal cell carcinoma 08/22/2019   Left mid ear helix. Nodular pattern, not treated pt was sched for [facility] 09/12/19 and canceled notes in [place], re bx on 03/11/22 bx proven BCC, Mohs 03/26/2022   Basal cell carcinoma 04/30/2020   Left cheek. Superficial and nodular patterns. excised   Coronary artery calcification seen on CT scan    Esophageal reflux    Essential hypertension    History of stress test    a. 01/2012 MV: EF 63%, no rwma. No ischemia/infarct. Low risk.   Mixed hyperlipidemia    Osteitis deformans without mention of bone tumor    Paget's Disease   PAD (peripheral artery disease) ([place])    a. 12/2012 and 05/2013 s/p bilat LE bypass @ [facility] in setting of bilat popliteal aneurysms; b. 05/2019 LE Duplex: R PT 50-74%, Nl L Leg.   Parkinson's disease    Persistent  atrial fibrillation ([place])    a. 02/2015 Event monitor: long periods of Afib--prev on flecainide; b. CHA2DS2VASc = 8-->Eliquis 5 bid.   Retinal detachment    Senile osteoporosis    Severe aortic stenosis    a. 05/2019 Echo: Mod AS. Mean grad 29.5 mmHg. Peak grad 55.7 mmHg. AVA 1.28 cm^2 (VTI); b. 08/2020 Echo: EF 65-70%, mod LVH, GrI DD, nl RV fxn, triv MR, mild-mod AI. Sev AS (AVA 0.87cm^2, mean grad 40, AoV Vmax 4.32ms). Mild dil of Ao root.   Skin cancer    Squamous cell carcinoma of skin 04/02/2017   Left cheek. SCCis. Referred for Moh's, clear at appt 06/18/17. Pt opted to treat with 5FU over Mohs.   Swelling of right lower extremity    a. 05/2019 LE U/S: No DVT. 50-64% R PT artery stenosis.   TIA (transient ischemic attack)    Type II diabetes mellitus ([place]     Past Surgical History:  Procedure Laterality Date   APPENDECTOMY     BACK SURGERY     CATARACT EXTRACTION     COLONOSCOPY  2006,2009   ESOPHAGEAL DILATION     GALLBLADDER SURGERY     HERNIA REPAIR     Lower extremity arterial bypass Bilateral    RETINAL DETACHMENT SURGERY     TONSILLECTOMY       Allergies  No Known Allergies  History of Present Illness    89year old  male with the above complex past medical history including severe inoperable aortic stenosis, chronic HFpEF, paroxysmal atrial fibrillation, abdominal aortic aneurysm, peripheral arterial disease status post bilateral lower extremity bypasses, hyperlipidemia, diabetes, TIA, normocytic anemia, Parkinson's, and stage II-III chronic kidney disease.  He previously underwent stress testing in July 2013, which was nonischemic.  This was followed by a stress echocardiogram at [facility] in 2016, which was normal.  Event monitoring in 2016 in the setting of palpitations showed prolonged peers of atrial fibrillation which has been managed with flecainide and Eliquis.  In the setting of progressive aortic stenosis with echo in February 2022 showing an EF of 65 to 70% and  severe aortic stenosis with a mean gradient of 40 mmHg, peak gradient of 68 mmHg, and valve area of 0.86 cm, he was seen by structural heart clinic in [place].  He was not felt to be a candidate for TAVR in the setting of advanced age and Parkinson's disease.  He has a history of hypotension and presyncope in the setting of bowel movements.  He has been more or less wheelchair-bound.  Timothy [name] was last seen in cardiology clinic in October 2023, at which time he was maintaining sinus rhythm.  He has been maintained on fiber supplements in order to keep his stools soft and limit the amount of straining and presyncope he might experience at home.  Patient says that he generally has 1 loose stool per day and has not been experiencing frequent episodes of presyncope.  Over the past 1 to 2 weeks, he has been experiencing an increase in dyspnea on exertion.  Activity is very limited primarily to transfers between his bed and wheelchair or wheelchair and toilet.  He uses a walker sparingly.  In addition to dyspnea, he has also been experiencing right lower chest and right upper abdominal discomfort.  This occurs about once a day, typically at rest, lasts an intermittent fashion over 30 minutes, and resolve spontaneously.  He has not really experienced symptoms with activity but sometimes immediately after activity.  In the early morning hours of January 22, he began experiencing chest pain radiating to his left arm.  He contacted staff at [facility] assisted living facility, and EMS was called.  He was treated with aspirin prior to arrival with resolution of discomfort.  Here, he was noted to be in sinus rhythm with a first-degree AV block, left axis deviation, and right bundle branch block.  Chest x-ray with mild CHF.  Creatinine up above prior baseline at 1.35.  Relatively stable normocytic anemia at 9.7/31.5.  BNP elevated at 904.2.  Troponin mildly elevated at 49  60  166  211  372.  His daughter is at his  bedside and at this time, he has no complaints.  Inpatient Medications     apixaban  5 mg Oral BID   [START ON 07/29/2022] calcium carbonate  1 tablet Oral Q breakfast   And   [START ON 07/29/2022] cholecalciferol  1,000 Units Oral Q breakfast   docusate sodium  100 mg Oral Daily   flecainide  50 mg Oral BID   [START ON 07/29/2022] furosemide  20 mg Intravenous Daily   insulin aspart  0-15 Units Subcutaneous TID WC   metoprolol succinate  50 mg Oral Daily   multivitamin with minerals  1 tablet Oral Daily   pantoprazole  40 mg Oral BID   rosuvastatin  10 mg Oral Daily   sertraline  50 mg Oral Daily  Family History    Family History  Problem Relation Age of Onset   Heart disease Mother    Diabetes Father    Retinal detachment Brother    Heart disease Brother    Stroke Brother    Diabetes Other        grandparents   He indicated that his mother is deceased. He indicated that his father is deceased. He indicated that the status of his other is unknown.   Social History    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  Tobacco Use   Smoking status: Never   Smokeless tobacco: Never  Vaping Use   Vaping Use: Never used  Substance and Sexual Activity   Alcohol use: No   Drug use: No   Sexual activity: Not Currently  Other Topics Concern   Not on file  Social History Narrative   Not on file   Social Determinants of Health   Financial Resource Strain: Not on file  Food Insecurity: Not on file  Transportation Needs: Not on file  Physical Activity: Not on file  Stress: Not on file  Social Connections: Not on file  Intimate Partner Violence: Not on file     Review of Systems    General:  No chills, fever, night sweats or weight changes.  Cardiovascular:  +++  Right lower chest pain with apparent midsternal chest pain rating to the left arm earlier this morning.  +++ dyspnea on exertion, no edema, orthopnea, palpitations, paroxysmal nocturnal dyspnea. Dermatological: No rash, lesions/masses Respiratory: No cough, +++ dyspnea Urologic: No hematuria, dysuria Abdominal:   Some increase in abdominal girth and tightness recently.  No nausea, vomiting, diarrhea, bright red blood per rectum, melena, or hematemesis Neurologic:  No visual changes, wkns, changes in mental status. All other systems reviewed and are otherwise negative except as noted above.  Physical Exam    Blood pressure 108/62, pulse 73, temperature 98 F (36.7 C), temperature source Oral, resp. rate (!) 30, height '5\' 8"'$  (1.727 m), weight 86.2 kg, SpO2 96 %.  General: Pleasant, NAD.  Soft-spoken. Psych: Flat affect. Neuro: Alert and oriented X 3. Moves all extremities spontaneously. HEENT: Normal  Neck: Supple without bruits.  Mildly elevated JVP. Lungs:  Resp regular and unlabored, bibasilar crackles. Heart: RRR no s3, s4.  3/6 systolic murmur heard throughout.  1/6 diastolic murmur heard at the upper sternal borders. Abdomen: Obese, semifirm and protuberant.  Nontender.  BS + x 4.  Extremities: No clubbing, cyanosis or edema. DP/PT2+, Radials 2+ and equal bilaterally.  Labs    Cardiac Enzymes Recent Labs  Lab 07/28/22 0433 07/28/22 0633 07/28/22 0957 07/28/22 1144 07/28/22 1404  TROPONINIHS 49* 60* 166* 211* 372*     BNP    Component Value Date/Time   BNP 904.2 (H) 07/28/2022 0800    Lab Results  Component Value Date   WBC 9.2 07/28/2022   HGB 9.7 (L) 07/28/2022   HCT 31.5 (L) 07/28/2022   MCV 94.0 07/28/2022   PLT 204 07/28/2022    Recent Labs  Lab 07/28/22 0433  NA 135  K 3.7  CL 104  CO2 21*  BUN 21  CREATININE 1.35*  CALCIUM 8.4*  GLUCOSE 181*   Lab Results  Component Value Date  CHOL 116 12/10/2017   HDL 32 (L) 12/10/2017   LDLCALC 21  12/10/2017   TRIG 317 (H) 12/10/2017   Lab Results  Component Value Date   DDIMER 1.18 (H) 04/27/2022      Radiology Studies    DG Chest Port 1 View  Result Date: 07/28/2022 CLINICAL DATA:  89 year old male with history of cough and midsternal chest pain. EXAM: PORTABLE CHEST 1 VIEW COMPARISON:  Chest x-ray 04/27/2022. FINDINGS: Blunting of the costophrenic sulci bilaterally, indicative of small bilateral pleural effusions. Opacity at the right base which likely reflects mild subsegmental atelectasis medially. Upper mediastinal contours are within normal limits. Atherosclerotic calcifications are noted in the thoracic aorta. Cephalization of the pulmonary vasculature with indistinct interstitial markings, concerning for a background of pulmonary edema. IMPRESSION: 1. The appearance the chest suggests mild congestive heart failure, as above. 2. Aortic atherosclerosis. Electronically Signed   By: Vinnie Langton M.D.   On: 07/28/2022 05:07    ECG & Cardiac Imaging    RSR, 90, 1st deg AVB, LAD, RBBB - personally reviewed.  Assessment & Plan    1.  Unstable angina/supply: Demand ischemia: Patient without formal prior history of CAD though prior CT imaging notable for multivessel coronary calcium.  In addition, he has severe aortic stenosis and was not felt to be an TAVR/SAVR candidate upon evaluation by our structural heart team in February 2022.  Over the past week, patient has noted some increase in dyspnea on exertion along with increasing abdominal girth and overall poor appetite.  He has also been experiencing daily right-sided chest pain occurring predominantly at rest over period of about 30 minutes, and resolving spontaneously.  He had recurrent chest pain early this morning prompting EMS and subsequent ED evaluation.  Here, ECG is without acute ST or T changes in the setting of known right bundle branch block.  Troponins have been slowly rising at 49  60  166  211  372.  He is currently  asymptomatic.  Daughter at bedside.  Long discussion about likely etiology of symptoms and the role of aortic stenosis and/or coronary artery disease in supply/demand ischemia.  An echocardiogram has been ordered in order to reevaluate LV function, which was normal in February 2022.  Continue beta-blocker and statin therapy.  No aspirin in the setting of chronic Eliquis.  Further recommendations pending echo.  2.  Severe aortic stenosis: See above.  Very likely playing a role in current presentation.  Follow-up echo to reevaluate LV function.  3.  Acute congestive heart failure: As above, 2-week history of increasing dyspnea on exertion and abdominal girth.  He does have evidence of volume overload on examination with JVD, abdominal distention, bibasilar crackles, BNP of 904.2, and chest x-ray showing mild CHF.  He has been treated with intravenous Lasix and thus far has had good response.  Agree with ongoing IV diuresis.  Follow-up echo to reevaluate LV function for prognostic value.  We discussed that in the setting of severe aortic stenosis, admission for heart failure is a poor prognostic indicator.  Would benefit from palliative care discussion and clear outline of goals of care.  4.  Paroxysmal atrial fibrillation: Maintaining sinus rhythm.  He has been managed with beta-blocker and flecainide in the outpatient setting however, in the setting of structural heart disease, chronic HFpEF with current heart failure admission, first-degree AV block, and right bundle branch block, we will discontinue flecainide therapy.  Continue anticoagulation with Eliquis and oral beta-blocker therapy.  5.  Hyperlipidemia:  Continue statin therapy.  6.  Normocytic anemia: H&H down slightly from prior baseline at 9.7 and 31.5.  Follow.  7.  Acute on chronic stage II-III kidney disease: Creatinine 1.35 on arrival.  1.01 in October.  Follow with gentle diuresis.  8.  Parkinson's disease: Stable.  Per medicine  team.  Risk Assessment/Risk Scores:     TIMI Risk Score for Unstable Angina or Non-ST Elevation MI:   The patient's TIMI risk score is 5, which indicates a 26% risk of all cause mortality, new or recurrent myocardial infarction or need for urgent revascularization in the next 14 days.  New York Heart Association (NYHA) Functional Class NYHA Class IV  CHA2DS2-VASc Score = 5   This indicates a 7.2% annual risk of stroke. The patient's score is based upon: CHF History: 1 HTN History: 1 Diabetes History: 0 Stroke History: 0 Vascular Disease History: 1 Age Score: 2 Gender Score: 0     Signed, Murray Hodgkins, NP 07/28/2022, 3:02 PM  For questions or updates, please contact   Please consult [email or web address] for contact info under Cardiology/STEMI.

## 2022-07-28 NOTE — ED Notes (Signed)
Pt urinated on self while provider was in room, "did not want to interrupt". Pt clothing removed, pt cleaned, external catheter placed, new chucks pad on bed, pt repositioned in bed for comfort. Bed is locked and in lowest position, call light in reach. VSS, WCTM.

## 2022-07-29 ENCOUNTER — Inpatient Hospital Stay: Payer: Medicare Other

## 2022-07-29 ENCOUNTER — Inpatient Hospital Stay
Admit: 2022-07-29 | Discharge: 2022-07-29 | Disposition: A | Discharge status: Home / Self Care | Payer: Medicare Other | Attending: Internal Medicine | Admitting: Internal Medicine

## 2022-07-29 ENCOUNTER — Inpatient Hospital Stay (HOSPITAL_COMMUNITY)
Admit: 2022-07-29 | Discharge: 2022-07-29 | Disposition: A | Discharge status: Home / Self Care | Payer: Medicare Other | Attending: Internal Medicine | Admitting: Internal Medicine

## 2022-07-29 DIAGNOSIS — I5031 Acute diastolic (congestive) heart failure: Secondary | ICD-10-CM

## 2022-07-29 DIAGNOSIS — R079 Chest pain, unspecified: Secondary | ICD-10-CM | POA: Diagnosis not present

## 2022-07-29 DIAGNOSIS — I480 Paroxysmal atrial fibrillation: Secondary | ICD-10-CM | POA: Diagnosis not present

## 2022-07-29 DIAGNOSIS — E785 Hyperlipidemia, unspecified: Secondary | ICD-10-CM

## 2022-07-29 DIAGNOSIS — I350 Nonrheumatic aortic (valve) stenosis: Secondary | ICD-10-CM | POA: Diagnosis not present

## 2022-07-29 DIAGNOSIS — I5033 Acute on chronic diastolic (congestive) heart failure: Secondary | ICD-10-CM

## 2022-07-29 DIAGNOSIS — N179 Acute kidney failure, unspecified: Secondary | ICD-10-CM | POA: Diagnosis present

## 2022-07-29 DIAGNOSIS — Z7189 Other specified counseling: Secondary | ICD-10-CM

## 2022-07-29 DIAGNOSIS — I5A Non-ischemic myocardial injury (non-traumatic): Secondary | ICD-10-CM | POA: Diagnosis present

## 2022-07-29 DIAGNOSIS — E1169 Type 2 diabetes mellitus with other specified complication: Secondary | ICD-10-CM

## 2022-07-29 DIAGNOSIS — R531 Weakness: Secondary | ICD-10-CM

## 2022-07-29 DIAGNOSIS — R29818 Other symptoms and signs involving the nervous system: Secondary | ICD-10-CM | POA: Diagnosis present

## 2022-07-29 DIAGNOSIS — R5381 Other malaise: Secondary | ICD-10-CM | POA: Diagnosis present

## 2022-07-29 LAB — ECHOCARDIOGRAM COMPLETE
AR max vel: 0.47 cm2
AV Area VTI: 0.43 cm2
AV Area mean vel: 0.48 cm2
AV Mean grad: 29 mmHg
AV Peak grad: 37.8 mmHg
Ao pk vel: 3.08 m/s
Height: 68 in
P 1/2 time: 477 msec
S' Lateral: 2.3 cm
Weight: 3040.58 oz

## 2022-07-29 LAB — CBC
HCT: 30.6 % — ABNORMAL LOW (ref 39.0–52.0)
Hemoglobin: 9.5 g/dL — ABNORMAL LOW (ref 13.0–17.0)
MCH: 28.9 pg (ref 26.0–34.0)
MCHC: 31 g/dL (ref 30.0–36.0)
MCV: 93 fL (ref 80.0–100.0)
Platelets: 219 10*3/uL (ref 150–400)
RBC: 3.29 MIL/uL — ABNORMAL LOW (ref 4.22–5.81)
RDW: 15.9 % — ABNORMAL HIGH (ref 11.5–15.5)
WBC: 7.8 10*3/uL (ref 4.0–10.5)
nRBC: 0 % (ref 0.0–0.2)

## 2022-07-29 LAB — BASIC METABOLIC PANEL
Anion gap: 11 (ref 5–15)
BUN: 21 mg/dL (ref 8–23)
CO2: 23 mmol/L (ref 22–32)
Calcium: 8.3 mg/dL — ABNORMAL LOW (ref 8.9–10.3)
Chloride: 102 mmol/L (ref 98–111)
Creatinine, Ser: 1.29 mg/dL — ABNORMAL HIGH (ref 0.61–1.24)
GFR, Estimated: 52 mL/min — ABNORMAL LOW (ref >60)
Glucose, Bld: 138 mg/dL — ABNORMAL HIGH (ref 70–99)
Potassium: 3.6 mmol/L (ref 3.5–5.1)
Sodium: 136 mmol/L (ref 135–145)

## 2022-07-29 LAB — CBG MONITORING, ED
Glucose-Capillary: 122 mg/dL — ABNORMAL HIGH (ref 70–99)
Glucose-Capillary: 216 mg/dL — ABNORMAL HIGH (ref 70–99)
Glucose-Capillary: 181 mg/dL — ABNORMAL HIGH (ref 70–99)

## 2022-07-29 LAB — FERRITIN: Ferritin: 33 ng/mL (ref 24–336)

## 2022-07-29 MED ORDER — SODIUM CHLORIDE 0.9 % IV SOLN
300.0000 mg | Freq: Once | INTRAVENOUS | Status: AC
  Administered 2022-07-29: 300 mg via INTRAVENOUS
  Filled 2022-07-29: qty 300

## 2022-07-29 MED ORDER — FUROSEMIDE 10 MG/ML IJ SOLN
40.0000 mg | Freq: Two times a day (BID) | INTRAMUSCULAR | Status: DC
  Administered 2022-07-29 – 2022-07-30 (×2): 40 mg via INTRAVENOUS
  Filled 2022-07-29 (×2): qty 4

## 2022-07-29 NOTE — Assessment & Plan Note (Signed)
PT and OT recommend SNF. TOC following for placement.

## 2022-07-29 NOTE — Evaluation (Signed)
Physical Therapy Evaluation Patient Details Name: Timothy [name] MRN: [id number] DOB: 27-Oct-1928 Today's Date: 07/29/2022  History of Present Illness  Pt is a 89 y.o. male presenting to hospital 07/28/22 with c/o chest pain and SOB.  Pt admitted with acute diastolic CHF, symptomatic severe aortic stenosis, and chest pain at rest (midsternal with radiation to L shoulder).  PMH includes htn, HLD, DM, severe aortic stenosis, a-fib on Eliquis, Parkinson's disease, AAA, Paget's disease, PAD, TIA, back surgery.  Clinical Impression  PT/OT co-evaluation performed.  Prior to hospital admission, pt required assist with bed mobility, transfers, being pushed in w/c, and walking with stand up walker; wife (who has dementia) assists pt; lives at [facility].  Currently pt is mod assist x2 with bed mobility and min to mod assist x2 to stand up (with B hand hold assist); pt unable to take any steps in place.  Pt requiring pacing d/t intermittent SOB (O2 sats 92% or greater on room air during sessions activities).  Pt would benefit from skilled PT to address noted impairments and functional limitations (see below for any additional details).  Upon hospital discharge, pt would benefit from SNF.    Recommendations for follow up therapy are one component of a multi-disciplinary discharge planning process, led by the attending physician.  Recommendations may be updated based on patient status, additional functional criteria and insurance authorization.  Follow Up Recommendations Skilled nursing-short term rehab (<3 hours/day) Can patient physically be transported by private vehicle: No    Assistance Recommended at Discharge Frequent or constant Supervision/Assistance  Patient can return home with the following  Two people to help with walking and/or transfers;Two people to help with bathing/dressing/bathroom;Assistance with cooking/housework;Assist for transportation;Help with stairs or ramp for entrance    Equipment  Recommendations  (pt has manual w/c and walker at home already)  Recommendations for Other Services  OT consult    Functional Status Assessment Patient has had a recent decline in their functional status and demonstrates the ability to make significant improvements in function in a reasonable and predictable amount of time.     Precautions / Restrictions Precautions Precautions: Fall Restrictions Weight Bearing Restrictions: No      Mobility  Bed Mobility Overal bed mobility: Needs Assistance Bed Mobility: Supine to Sit, Sit to Supine     Supine to sit: Mod assist, +2 for physical assistance Sit to supine: Mod assist, +2 for physical assistance   General bed mobility comments: assist for trunk and B LE's    Transfers Overall transfer level: Needs assistance Equipment used: 2 person hand held assist Transfers: Sit to/from Stand Sit to Stand: Min assist, Mod assist, +2 physical assistance           General transfer comment: assist to stand and control descent sitting; assist for balance in standing    Ambulation/Gait               General Gait Details: pt unable to take any steps with B hand hold assist  Stairs            Wheelchair Mobility    Modified Rankin (Stroke Patients Only)       Balance Overall balance assessment: Needs assistance Sitting-balance support: Bilateral upper extremity supported, Feet supported Sitting balance-Leahy Scale: Poor Sitting balance - Comments: close SBA to min assist for sitting balance Postural control: Posterior lean Standing balance support: Bilateral upper extremity supported Standing balance-Leahy Scale: Poor Standing balance comment: pt with L lean in standing  Pertinent Vitals/Pain Pain Assessment Pain Assessment: No/denies pain HR WFL during sessions activities.    Home Living Family/patient expects to be discharged to:: Other (Comment) [facility]) Living  Arrangements: Spouse/significant other Available Help at Discharge: Family;Available 24 hours/day;Personal care attendant Type of Home: Independent living facility Home Access: Level entry       Home Layout: One level Home Equipment: Shower seat;Wheelchair - manual Additional Comments: Stand up walker.  Pt's wife has dementia.    Prior Function Prior Level of Function : Needs assist       Physical Assist : Mobility (physical)     Mobility Comments: Pt's wife assists pt with bed mobility, transfers, pushing pt in manual w/c, and walking with stand up walker. ADLs Comments: Pt has caregivers 7 days per week: M/W/F 7:30 to 11:30 (4 hours each day); T/R/Sat/Sun 7:30-9:30 (2 hours each day).  Caregivers assist with bathing pt MWF.  Pt's wife pushes pt in w/c to meals.     Hand Dominance        Extremity/Trunk Assessment   Upper Extremity Assessment Upper Extremity Assessment: Defer to OT evaluation    Lower Extremity Assessment Lower Extremity Assessment: Generalized weakness (at least 3/5 AROM hip flexion, knee flexion/extension, and DF/PF)    Cervical / Trunk Assessment Cervical / Trunk Assessment: Other exceptions Cervical / Trunk Exceptions: forward head/shoulders  Communication   Communication: HOH  Cognition Arousal/Alertness: Awake/alert Behavior During Therapy: WFL for tasks assessed/performed Overall Cognitive Status: Within Functional Limits for tasks assessed                                          General Comments  Per chart pt's elevated troponin most likely demand ischemia from heart failure.    Exercises     Assessment/Plan    PT Assessment Patient needs continued PT services  PT Problem List Decreased strength;Decreased activity tolerance;Decreased balance;Decreased mobility;Decreased knowledge of use of DME;Cardiopulmonary status limiting activity       PT Treatment Interventions DME instruction;Gait training;Functional  mobility training;Therapeutic activities;Therapeutic exercise;Balance training;Patient/family education    PT Goals (Current goals can be found in the Care Plan section)  Acute Rehab PT Goals Patient Stated Goal: to improve breathing and mobility PT Goal Formulation: With patient/family Time For Goal Achievement: 08/12/22 Potential to Achieve Goals: Fair    Frequency Min 2X/week     Co-evaluation PT/OT/SLP Co-Evaluation/Treatment: Yes Reason for Co-Treatment: For patient/therapist safety;To address functional/ADL transfers PT goals addressed during session: Mobility/safety with mobility;Balance OT goals addressed during session: ADL's and self-care       AM-PAC PT "6 Clicks" Mobility  Outcome Measure Help needed turning from your back to your side while in a flat bed without using bedrails?: A Little Help needed moving from lying on your back to sitting on the side of a flat bed without using bedrails?: Total Help needed moving to and from a bed to a chair (including a wheelchair)?: Total Help needed standing up from a chair using your arms (e.g., wheelchair or bedside chair)?: Total Help needed to walk in hospital room?: Total Help needed climbing 3-5 steps with a railing? : Total 6 Click Score: 8    End of Session Equipment Utilized During Treatment: Gait belt Activity Tolerance: Other (comment) (Limited d/t SOB/fatigue) Patient left: with call bell/phone within reach;with family/visitor present;Other (comment);in bed (B railings elevated on ED stretcher bed and bed in lowest  position) Nurse Communication: Mobility status;Precautions PT Visit Diagnosis: Other abnormalities of gait and mobility (R26.89);Muscle weakness (generalized) (M62.81)    Time: 5465-6812 PT Time Calculation (min) (ACUTE ONLY): 27 min   Charges:   PT Evaluation $PT Eval Low Complexity: 1 Low         Keaton Beichner, PT 07/29/22, 4:40 PM

## 2022-07-29 NOTE — Assessment & Plan Note (Addendum)
Stable. PT/OT recommend SNF

## 2022-07-29 NOTE — Hospital Course (Signed)
89 y.o. male with medical history significant for hypertension, diabetes mellitus type 2 on oral agents, paroxysmal A-fib on Eliquis, chronic diastolic dysfunction CHF, peripheral arterial disease, Parkinson's disease, severe aortic stenosis (aortic valve area by VTI measures 0.87 cm2.  Aortic valve mean gradient measures 40.0 mmHg) seen and evaluated by cardiac surgery and deemed not a surgical candidate due to advanced Parkinson's disease, advanced age and decreased functional status who presents to the ER via EMS for evaluation of chest pain and worsening shortness of breath. Patient's daughter notes that he has had worsening shortness of breath over several days and this was noted by his caregiver who observed that with minimal exertion patient was out of breath and needed time to get back to his baseline.  He also complains of midsternal chest pain with radiation to his left arm that he has had intermittently for 3 days.  Denies having any nausea, no vomiting, no diaphoresis or palpitations and chest pain improved after he received aspirin by EMS. Notes that shortness of breath occurs with both rest and exertion and he is unable to lay flat in bed.  He does have some mild lower extremity swelling. Daughter notes that he has episodes of syncope/near syncope mostly when having bowel movements and this happens regardless of whether he has constipation or diarrhea.  Patient denies straining to have bowel movements. He is mostly wheelchair-bound and will sometimes use a rolling walker to get around his apartment He denies having any cough, no fever, no chills, no abdominal pain, no dizziness, no lightheadedness, no urinary symptoms, no blurred vision no focal deficit. Abnormal labs include uptrending troponin 49 >> 60, BNP 904 Chest x-ray reviewed by me shows findings suggestive of mild CHF Twelve-lead EKG reviewed by me shows sinus rhythm with first-degree AV block.  Left axis deviation and a right bundle  branch block. Patient received a dose of Lasix 40 mg IV and will be admitted to the hospital for further evaluation.

## 2022-07-29 NOTE — ED Notes (Signed)
NP at bedside.

## 2022-07-29 NOTE — Progress Notes (Addendum)
Progress Note   Patient: Timothy [name]:[id number] DOB: July 31, 1928 DOA: 07/28/2022     1 DOS: the patient was seen and examined on 07/29/2022   Brief hospital course: 89 y.o. male with medical history significant for hypertension, diabetes mellitus type 2 on oral agents, paroxysmal A-fib on Eliquis, chronic diastolic dysfunction CHF, peripheral arterial disease, Parkinson's disease, severe aortic stenosis (aortic valve area by VTI measures 0.87 cm2.  Aortic valve mean gradient measures 40.0 mmHg) seen and evaluated by cardiac surgery and deemed not a surgical candidate due to advanced Parkinson's disease, advanced age and decreased functional status who presents to the ER via EMS for evaluation of chest pain and worsening shortness of breath. Patient's daughter notes that he has had worsening shortness of breath over several days and this was noted by his caregiver who observed that with minimal exertion patient was out of breath and needed time to get back to his baseline.  He also complains of midsternal chest pain with radiation to his left arm that he has had intermittently for 3 days.  Denies having any nausea, no vomiting, no diaphoresis or palpitations and chest pain improved after he received aspirin by EMS. Notes that shortness of breath occurs with both rest and exertion and he is unable to lay flat in bed.  He does have some mild lower extremity swelling. Daughter notes that he has episodes of syncope/near syncope mostly when having bowel movements and this happens regardless of whether he has constipation or diarrhea.  Patient denies straining to have bowel movements. He is mostly wheelchair-bound and will sometimes use a rolling walker to get around his apartment He denies having any cough, no fever, no chills, no abdominal pain, no dizziness, no lightheadedness, no urinary symptoms, no blurred vision no focal deficit. Abnormal labs include uptrending troponin 49 >> 60, BNP 904 Chest  x-ray reviewed by me shows findings suggestive of mild CHF Twelve-lead EKG reviewed by me shows sinus rhythm with first-degree AV block.  Left axis deviation and a right bundle branch block. Patient received a dose of Lasix 40 mg IV and will be admitted to the hospital for further evaluation.  Assessment and Plan: * Acute diastolic CHF (congestive heart failure) (HCC) With history of severe aortic stenosis.  Mortality is high with in the next year.  Continue IV Lasix, continue Toprol.  Repeat echo pending.  Symptomatic severe aortic stenosis with normal ejection fraction Repeat echo pending.    Chest pain at rest Right-sided chest pain.  Could be shingles without a rash.  Continue to monitor if a rash develops.  X-ray showing old fractures of the seventh and eighth lateral ribs.  Will check an SPEP.  Unlikely pulmonary embolism with being on Eliquis.  Myocardial injury Elevated troponin likely demand ischemia from heart failure.  AKI (acute kidney injury) ([place]) Watch closely with diuresis.  Creatinine 1.35 on presentation down to 1.29.  Previous creatinine a few months ago 1.01.  Generalized weakness PT and OT consultations.  Parkinsonian features PT and OT consultation  Type 2 diabetes mellitus with hyperlipidemia (HCC) Last hemoglobin A1c 7.7.  On sliding scale.  Holding oral hypoglycemics.  On Crestor.  Parkinson disease Continue Sinemet  AF (paroxysmal atrial fibrillation) (HCC) Continue Toprol for rate control and Eliquis for anticoagulation.  Flecainide discontinued by cardiology with heart failure.        Subjective: Patient complains of right-sided chest pain radiating back around his shoulder blade.  Feels like it is inside.  Also having  shortness of breath admitted with heart failure with aortic stenosis.  Physical Exam: Vitals:   07/29/22 1100 07/29/22 1130 07/29/22 1200 07/29/22 1227  BP: 114/66  115/63   Pulse: 72 70 73   Resp: 10 (!) 21    Temp:    97.7  F (36.5 C)  TempSrc:    Oral  SpO2: 91% 96% 92%   Weight:      Height:       Physical Exam HENT:     Head: Normocephalic.     Mouth/Throat:     Pharynx: No oropharyngeal exudate.  Eyes:     General: Lids are normal.     Conjunctiva/sclera: Conjunctivae normal.  Cardiovascular:     Rate and Rhythm: Normal rate and regular rhythm.     Heart sounds: Normal heart sounds, S1 normal and S2 normal.  Pulmonary:     Breath sounds: Examination of the right-lower field reveals decreased breath sounds and rales. Examination of the left-lower field reveals decreased breath sounds and rales. Decreased breath sounds and rales present. No wheezing or rhonchi.  Abdominal:     Palpations: Abdomen is soft.     Tenderness: There is no abdominal tenderness.  Musculoskeletal:     Right lower leg: Swelling present.     Left lower leg: Swelling present.  Skin:    General: Skin is warm.     Findings: No rash.     Comments: No rash seen on chest or back on the right side  Neurological:     Mental Status: He is alert and oriented to person, place, and time.     Data Reviewed: Creatinine 1.29 with a GFR 52, troponin 4.99, hemoglobin 9.5  Family Communication: Spoke with daughter at the bedside  Disposition: Status is: Inpatient Remains inpatient appropriate because: Treating congestive heart failure with aortic stenosis.  Patient having right-sided chest pain  Planned Discharge Destination: Likely home with home health    Time spent: 28 minutes  Author: Loletha Grayer, MD 07/29/2022 1:33 PM  For on call review [email or web address].

## 2022-07-29 NOTE — Assessment & Plan Note (Signed)
Elevated troponin likely demand ischemia from heart failure.

## 2022-07-29 NOTE — ED Notes (Signed)
PT and OT at bedside

## 2022-07-29 NOTE — Assessment & Plan Note (Signed)
Watch closely with diuresis.  Creatinine 1.35 on presentation down to 1.29.  Previous creatinine a few months ago 1.01.

## 2022-07-29 NOTE — Progress Notes (Signed)
*  PRELIMINARY RESULTS* Echocardiogram 2D Echocardiogram has been performed.  Timothy [name] 07/29/2022, 2:28 PM

## 2022-07-29 NOTE — Evaluation (Signed)
Occupational Therapy Evaluation Patient Details Name: Timothy [name] MRN: [id number] DOB: Feb 28, 1929 Today's Date: 07/29/2022   History of Present Illness Pt is a 89 y.o. male presenting to hospital 07/28/22 with c/o chest pain and SOB.  Pt admitted with acute diastolic CHF, symptomatic severe aortic stenosis, and chest pain at rest (midsternal with radiation to L shoulder).  PMH includes htn, HLD, DM, severe aortic stenosis, a-fib on Eliquis, Parkinson's disease, AAA, Paget's disease, PAD, TIA, back surgery.   Clinical Impression   Pt agreeable to OT/PT co-treatment to maximize safety and participation. Daughter present. Patient presenting with decreased independence in self care, balance, functional mobility/transfers, and endurance. PTA pt lived with wife (has dementia) at [place]. At baseline, pt receives assistance for ADLs, IADLs, and functional mobility. Pt has a PCA that comes 7 days/week for several hours to assist with ADLs. Pt currently functioning at Mod A +2 for bed mobility, Min-Mod A +2 to stand from EOB via HHA, and Max A for UB/LB dressing. Pt will benefit from acute OT to increase overall independence in the areas of ADLs and functional mobility in order to safely discharge to next venue of care. Upon hospital discharge, recommend STR to maximize pt safety and return to PLOF.    Recommendations for follow up therapy are one component of a multi-disciplinary discharge planning process, led by the attending physician.  Recommendations may be updated based on patient status, additional functional criteria and insurance authorization.   Follow Up Recommendations  Skilled nursing-short term rehab (<3 hours/day)     Assistance Recommended at Discharge Frequent or constant Supervision/Assistance  Patient can return home with the following Two people to help with bathing/dressing/bathroom;Two people to help with walking and/or transfers;Assistance with cooking/housework;Assist for  transportation;Help with stairs or ramp for entrance    Functional Status Assessment  Patient has had a recent decline in their functional status and demonstrates the ability to make significant improvements in function in a reasonable and predictable amount of time.  Equipment Recommendations  Other (comment) (defer to next venue of care)    Recommendations for Other Services       Precautions / Restrictions Precautions Precautions: Fall Restrictions Weight Bearing Restrictions: No      Mobility Bed Mobility Overal bed mobility: Needs Assistance Bed Mobility: Supine to Sit, Sit to Supine     Supine to sit: Mod assist, +2 for physical assistance Sit to supine: Mod assist, +2 for physical assistance   General bed mobility comments: assist for trunk and B LE's    Transfers Overall transfer level: Needs assistance Equipment used: 2 person hand held assist Transfers: Sit to/from Stand Sit to Stand: Min assist, Mod assist, +2 physical assistance                  Balance Overall balance assessment: Needs assistance Sitting-balance support: Bilateral upper extremity supported, Feet supported Sitting balance-Leahy Scale: Poor Sitting balance - Comments: close SBA to min assist for sitting balance Postural control: Posterior lean Standing balance support: Bilateral upper extremity supported Standing balance-Leahy Scale: Poor Standing balance comment: pt with L lateral lean in standing, unable to self-correct                           ADL either performed or assessed with clinical judgement   ADL Overall ADL's : Needs assistance/impaired                 Upper Body Dressing :  Bed level;Maximal assistance Upper Body Dressing Details (indicate cue type and reason): to doff t-shirt and don hospital gown Lower Body Dressing: Maximal assistance;Bed level Lower Body Dressing Details (indicate cue type and reason): socks Toilet Transfer: Minimal  assistance;Moderate assistance;+2 for physical assistance Toilet Transfer Details (indicate cue type and reason): simulated, via HHA Toileting- Clothing Manipulation and Hygiene: Sit to/from stand;Maximal assistance       Functional mobility during ADLs:  (Pt unable to take any steps this date with B hand held assist)       Vision Baseline Vision/History: 1 Wears glasses Patient Visual Report: No change from baseline       Perception     Praxis      Pertinent Vitals/Pain Pain Assessment Pain Assessment: No/denies pain     Hand Dominance     Extremity/Trunk Assessment Upper Extremity Assessment Upper Extremity Assessment: Generalized weakness   Lower Extremity Assessment Lower Extremity Assessment: Generalized weakness   Cervical / Trunk Assessment Cervical / Trunk Assessment: Other exceptions Cervical / Trunk Exceptions: forward head/shoulders   Communication Communication Communication: HOH   Cognition Arousal/Alertness: Awake/alert Behavior During Therapy: WFL for tasks assessed/performed Overall Cognitive Status: Within Functional Limits for tasks assessed                                 General Comments: slow to respond to questions at times likely due to SOB     General Comments       Exercises Other Exercises Other Exercises: OT provided education re: role of OT, OT POC, post acute recs, sitting up for all meals, EOB/OOB mobility with assistance, home/fall safety.     Shoulder Instructions      Home Living Family/patient expects to be discharged to:: Other (Comment) ([place]) Living Arrangements: Spouse/significant other Available Help at Discharge: Family;Available 24 hours/day;Personal care attendant Type of Home: Independent living facility Home Access: Level entry     Home Layout: One level     Bathroom Shower/Tub: Occupational psychologist: Handicapped height     Home Equipment: Shower seat;Wheelchair -  manual   Additional Comments: Stand up walker.  Pt's wife has dementia.      Prior Functioning/Environment Prior Level of Function : Needs assist       Physical Assist : Mobility (physical);ADLs (physical)     Mobility Comments: Pt's wife assists pt with bed mobility, transfers, pushing pt in manual w/c, and walking with stand up walker. ADLs Comments: Pt has caregivers 7 days per week: M/W/F 7:30 to 11:30 (4 hours each day); T/R/Sat/Sun 7:30-9:30 (2 hours each day).  Caregivers assist with bathing pt MWF, receives assistance for dressing.  Pt's wife pushes pt in w/c to dining hall for meals.        OT Problem List: Decreased strength;Decreased activity tolerance;Impaired balance (sitting and/or standing);Decreased knowledge of use of DME or AE;Cardiopulmonary status limiting activity      OT Treatment/Interventions: Self-care/ADL training;Therapeutic activities;Therapeutic exercise;DME and/or AE instruction;Patient/family education;Balance training;Energy conservation    OT Goals(Current goals can be found in the care plan section) Acute Rehab OT Goals Patient Stated Goal: return home OT Goal Formulation: With patient/family Time For Goal Achievement: 08/12/22 Potential to Achieve Goals: Fair   OT Frequency: Min 2X/week    Co-evaluation PT/OT/SLP Co-Evaluation/Treatment: Yes Reason for Co-Treatment: For patient/therapist safety;To address functional/ADL transfers PT goals addressed during session: Mobility/safety with mobility;Balance OT goals addressed during session: ADL's  and self-care      AM-PAC OT "6 Clicks" Daily Activity     Outcome Measure Help from another person eating meals?: A Little Help from another person taking care of personal grooming?: A Little Help from another person toileting, which includes using toliet, bedpan, or urinal?: A Lot Help from another person bathing (including washing, rinsing, drying)?: A Lot Help from another person to put on and  taking off regular upper body clothing?: A Lot Help from another person to put on and taking off regular lower body clothing?: A Lot 6 Click Score: 14   End of Session Equipment Utilized During Treatment: Gait belt Nurse Communication: Mobility status  Activity Tolerance: Patient limited by fatigue;Other (comment) (SOB) Patient left: with call bell/phone within reach;with bed alarm set;with family/visitor present  OT Visit Diagnosis: Unsteadiness on feet (R26.81);Muscle weakness (generalized) (M62.81)                Time: 9021-1155 OT Time Calculation (min): 27 min Charges:  OT General Charges $OT Visit: 1 Visit OT Evaluation $OT Eval Low Complexity: 1 Low  [facility] MS, OTR/L ascom [phone]  07/29/22, 6:15 PM

## 2022-07-29 NOTE — Discharge Instructions (Signed)

## 2022-07-29 NOTE — Progress Notes (Signed)
Cardiology Progress Note   Patient Name: Timothy [name] Date of Encounter: 07/29/2022  Primary Cardiologist: Ida Rogue, MD  Subjective   Intermittent right sided chest pain overnight and this AM.  CXR pending.  Dtr @ bedside - notes DOE w/ minimal activity/repositioning in stretcher.  Inpatient Medications    Scheduled Meds:  apixaban  5 mg Oral BID   calcium carbonate  1 tablet Oral Q breakfast   And   cholecalciferol  1,000 Units Oral Q breakfast   docusate sodium  100 mg Oral Daily   furosemide  20 mg Intravenous BID   insulin aspart  0-15 Units Subcutaneous TID WC   metoprolol succinate  50 mg Oral Daily   multivitamin with minerals  1 tablet Oral Daily   pantoprazole  40 mg Oral BID   rosuvastatin  10 mg Oral Daily   sertraline  50 mg Oral Daily   Continuous Infusions:  PRN Meds: acetaminophen, ondansetron **OR** ondansetron (ZOFRAN) IV   Vital Signs    Vitals:   07/29/22 0402 07/29/22 0500 07/29/22 0503 07/29/22 0700  BP: 102/61 (!) 103/50  (!) 95/52  Pulse: 71 65  64  Resp: 16 (!) 24  (!) 23  Temp:   98.5 F (36.9 C)   TempSrc:   Oral   SpO2: 93% 94%  96%  Weight:      Height:        Intake/Output Summary (Last 24 hours) at 07/29/2022 0932 Last data filed at 07/29/2022 0904 Gross per 24 hour  Intake --  Output 1550 ml  Net -1550 ml   Filed Weights   07/28/22 0432  Weight: 86.2 kg    Physical Exam   GEN: Well nourished, well developed, in no acute distress.  HEENT: Grossly normal.  Neck: Supple, moderately elevated JVD.  Radiated murmur bilateral carotids. Cardiac: RRR, 3/6 systolic murmur heard throughout.  No clubbing, cyanosis, edema.  Radials 2+, DP/PT 2+ and equal bilaterally.  Respiratory:  Respirations regular and unlabored, bibasilar crackles. GI: Softer than yesterday, nontender, nondistended, BS + x 4. MS: no deformity or atrophy. Skin: warm and dry, no rash. Neuro:  Strength and sensation are intact. Psych: AAOx3.  Flat  affect.  Labs    Chemistry Recent Labs  Lab 07/28/22 0433 07/29/22 0501  NA 135 136  K 3.7 3.6  CL 104 102  CO2 21* 23  GLUCOSE 181* 138*  BUN 21 21  CREATININE 1.35* 1.29*  CALCIUM 8.4* 8.3*  GFRNONAA 50* 52*  ANIONGAP 10 11     Hematology Recent Labs  Lab 07/28/22 0433 07/29/22 0501  WBC 9.2 7.8  RBC 3.35* 3.29*  HGB 9.7* 9.5*  HCT 31.5* 30.6*  MCV 94.0 93.0  MCH 29.0 28.9  MCHC 30.8 31.0  RDW 15.7* 15.9*  PLT 204 219    Cardiac Enzymes  Recent Labs  Lab 07/28/22 0633 07/28/22 0957 07/28/22 1144 07/28/22 1404 07/28/22 1630  TROPONINIHS 60* 166* 211* 372* 499*      BNP    Component Value Date/Time   BNP 904.2 (H) 07/28/2022 0800   Lipids  Lab Results  Component Value Date   CHOL 116 12/10/2017   HDL 32 (L) 12/10/2017   LDLCALC 21 12/10/2017   TRIG 317 (H) 12/10/2017   CHOLHDL 3.6 12/10/2017    HbA1c  Lab Results  Component Value Date   HGBA1C 7.7 (H) 07/28/2022    Radiology    DG Chest Port 1 View  Result Date: 07/28/2022 CLINICAL  DATA:  89 year old male with history of cough and midsternal chest pain. EXAM: PORTABLE CHEST 1 VIEW COMPARISON:  Chest x-ray 04/27/2022. FINDINGS: Blunting of the costophrenic sulci bilaterally, indicative of small bilateral pleural effusions. Opacity at the right base which likely reflects mild subsegmental atelectasis medially. Upper mediastinal contours are within normal limits. Atherosclerotic calcifications are noted in the thoracic aorta. Cephalization of the pulmonary vasculature with indistinct interstitial markings, concerning for a background of pulmonary edema. IMPRESSION: 1. The appearance the chest suggests mild congestive heart failure, as above. 2. Aortic atherosclerosis. Electronically Signed   By: Vinnie Langton M.D.   On: 07/28/2022 05:07    Telemetry    Regular send rhythm- Personally Reviewed  Cardiac Studies   2D Echocardiogram 2.1.2022   1. Left ventricular ejection fraction, by  estimation, is 65 to 70%. The  left ventricle has normal function. Left ventricular endocardial border  not optimally defined to evaluate regional wall motion. There is moderate  left ventricular hypertrophy. Left  ventricular diastolic parameters are consistent with Grade I diastolic  dysfunction (impaired relaxation). Elevated left atrial pressure.   2. Right ventricular systolic function is normal. The right ventricular  size is normal.   3. The mitral valve is grossly normal. Trivial mitral valve  regurgitation. No evidence of mitral stenosis.   4. The aortic valve has an indeterminant number of cusps. There is mild  calcification of the aortic valve. There is mild thickening of the aortic  valve. Aortic valve regurgitation is mild to moderate. Severe aortic valve  stenosis. Aortic valve area, by  VTI measures 0.87 cm. Aortic valve mean gradient measures 40.0 mmHg.  Aortic valve Vmax measures 4.13 m/s.   5. Aortic dilatation noted. There is mild dilatation of the aortic root,  measuring 40 mm.   6. The inferior vena cava is normal in size with greater than 50%  respiratory variability, suggesting right atrial pressure of 3 mmHg.  _____________  2D Echocardiogram 1.23.2024  Pending _____________   Patient Profile     89 y.o. male  with a history of severe inoperable aortic stenosis, chronic HFpEF, paroxysmal atrial fibrillation, abdominal aortic aneurysm, peripheral arterial disease, hyperlipidemia, diabetes, TIA, normocytic anemia, Parkinson's, and stage II-III chronic kidney disease, who presented with chest pain and presyncope on January 22.  Assessment & Plan    1.  Unstable angina; supply/demand ischemia: Patient without formal prior history of CAD though prior CT imaging notable for multivessel coronary calcium.  In addition, he has severe aortic stenosis and is not a TAVR/SAVR candidate.  Presented January 22 with a 1 week history of progressive dyspnea on exertion,  increasing abdominal girth, and poor appetite.  Also complained of daily right-sided chest pain.  More profound episode in the early morning hours of January 22 prompting presentation.  Troponin up to 499.  Has had intermittent right-sided chest and upper abdominal discomfort overnight and this morning.  This does not worsen with palpation.  Chest x-ray pending.  Await echo to evaluate EF and for progression of aortic stenosis.  Continue beta-blocker and statin therapy.  No aspirin in the setting of Eliquis.  Further recommendations pending echo.  2.  Severe aortic stenosis: See #1.  Symptoms likely consistent with progression of aortic stenosis.  Echo pending.  3.  Acute CHF: As above, 2-week history of increasing dyspnea exertion and abdominal girth with mild volume overload on examination and BNP of 904.2.  Chest x-ray with CHF on admission.  He has been treated with intravenous  Lasix and has thus far responded well with good urine output.  I's and O's otherwise inaccurate.  Continues to experience dyspnea with repositioning in stretcher.  Ongoing crackles and JVD on examination.  Continue IV Lasix-will escalate to 40 mg twice daily given improvement in renal function.  4.  Paroxysmal atrial fibrillation: Maintaining sinus rhythm.  In the setting of structural heart disease, chronic HFpEF with current heart failure admission, first-degree AV block, and right bundle branch block, we discontinued flecainide therapy, which she has been on for several years.  He has been chronically anticoagulated with Eliquis.  Continue beta-blocker.  He will likely need an alternate antiarrhythmic following flecainide washout (half-life 20 hours with last dose on January 21).  His daughters indicated that she would not want him on amiodarone because his mother was on that at 1 point and they were warned of toxic effects.  Limited alternate options.  5.  Hyperlipidemia: Continue statin.  6.  Normocytic anemia: H&H  relatively stable this morning at 9.5 and 30.6.  7.  Acute on chronic stage II-III kidney disease: Creatinine 1.35 on presentation, 1.29 this morning.  8.  Parkinson's disease: Stable.  Per medicine team.  Signed, Murray Hodgkins, NP  07/29/2022, 9:32 AM    For questions or updates, please contact   Please consult [email or web address] for contact info under Cardiology/STEMI.

## 2022-07-29 NOTE — Consult Note (Signed)
Consultation Note Date: 07/29/2022   Patient Name: Timothy [name]  DOB: 1929/05/01  MRN: [id number]  Age / Sex: 89 y.o., male  PCP: [facility] Referring Physician: Loletha Grayer, MD  Reason for Consultation: Establishing goals of care  HPI/Patient Profile: 89 y.o. male with medical history significant for hypertension, diabetes mellitus type 2 on oral agents, paroxysmal A-fib on Eliquis, chronic diastolic dysfunction CHF, peripheral arterial disease, Parkinson's disease, severe aortic stenosis (aortic valve area by VTI measures 0.87 cm2.  Aortic valve mean gradient measures 40.0 mmHg) seen and evaluated by cardiac surgery and deemed not a surgical candidate due to advanced Parkinson's disease, advanced age and decreased functional status who presents to the ER via EMS for evaluation of chest pain and worsening shortness of breath.   Clinical Assessment and Goals of Care: Notes and labs reviewed.  Into see patient.  Patient's daughter [name] was at bedside.  Patient lives in independent living with his wife of 4 years.  He was widowed prior to this.  Patient has 5 children.  He is retired from being a Systems analyst.  He has strong faith and is primitive [place].  He tells me of how he started his business completely on faith.  He discusses God in his life.   We discussed his diagnosis, prognosis, GOC, EOL wishes disposition and options.  Created space and opportunity for patient  to explore thoughts and feelings regarding current medical information.   A detailed discussion was had today regarding advanced directives.  Concepts specific to code status, artifical feeding and hydration, IV antibiotics and rehospitalization were discussed.  The difference between an aggressive medical intervention path and a comfort care path was discussed.  Values and goals of care important to patient and family  were attempted to be elicited.  Discussed limitations of medical interventions to prolong quality of life in some situations and discussed the concept of human mortality.  Patient and daughter state right now they would like to continue to treat the treatable.  Daughter is able to articulate his status as well as his prognosis and expectations moving forward.  They would like outpatient palliative to follow with transition to hospice care when they are ready.  SUMMARY OF RECOMMENDATIONS   Recommend outpatient palliative with transition to hospice when they are ready.  Patient states his oldest daughter [name], and his youngest daughter or his  HPOA's.  Prognosis:  Poor overall       Primary Diagnoses: Present on Admission:  Acute diastolic CHF (congestive heart failure) (HCC)  AF (paroxysmal atrial fibrillation) (HCC)  Parkinson disease  Chest pain at rest   I have reviewed the medical record, interviewed the patient and family, and examined the patient. The following aspects are pertinent.  Past Medical History:  Diagnosis Date   (HFpEF) heart failure with preserved ejection fraction ([place])    a. 05/2019 Echo: EF 65-70%, mod LVH. Gr2 DD. Mod AS; b. 08/2020 Echo: EF 65-70%, mod LVH, GrI DD, nl RV fxn, severe AS.   AAA (  abdominal aortic aneurysm) ([place])    a. 2014 Abd U/S: 3.5cm AAA; b. 10/20189 Abd U/S: 3.3cm AAA; c. 01/2018 CT Abd/Pelvis: 2.7cm infrarenal AAA - rec f/u U/S in 5 yrs.   Actinic keratosis    Back pain    Basal cell carcinoma 08/22/2019   Left mid ear helix. Nodular pattern, not treated pt was sched for [facility] 09/12/19 and canceled notes in [place], re bx on 03/11/22 bx proven BCC, Mohs 03/26/2022   Basal cell carcinoma 04/30/2020   Left cheek. Superficial and nodular patterns. excised   Coronary artery calcification seen on CT scan    Esophageal reflux    Essential hypertension    History of stress test    a. 01/2012 MV: EF 63%, no rwma. No ischemia/infarct. Low risk.    Mixed hyperlipidemia    Osteitis deformans without mention of bone tumor    Paget's Disease   PAD (peripheral artery disease) ([place])    a. 12/2012 and 05/2013 s/p bilat LE bypass @ [facility] in setting of bilat popliteal aneurysms; b. 05/2019 LE Duplex: R PT 50-74%, Nl L Leg.   Parkinson's disease    Persistent atrial fibrillation ([place])    a. 02/2015 Event monitor: long periods of Afib--prev on flecainide; b. CHA2DS2VASc = 8-->Eliquis 5 bid.   Retinal detachment    Senile osteoporosis    Severe aortic stenosis    a. 05/2019 Echo: Mod AS. Mean grad 29.5 mmHg. Peak grad 55.7 mmHg. AVA 1.28 cm^2 (VTI); b. 08/2020 Echo: EF 65-70%, mod LVH, GrI DD, nl RV fxn, triv MR, mild-mod AI. Sev AS (AVA 0.87cm^2, mean grad 40, AoV Vmax 4.68ms). Mild dil of Ao root.   Skin cancer    Squamous cell carcinoma of skin 04/02/2017   Left cheek. SCCis. Referred for Moh's, clear at appt 06/18/17. Pt opted to treat with 5FU over Mohs.   Swelling of right lower extremity    a. 05/2019 LE U/S: No DVT. 50-64% R PT artery stenosis.   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  Tobacco Use   Smoking status: Never   Smokeless tobacco: Never  Vaping Use   Vaping Use: Never used  Substance and Sexual Activity   Alcohol use: No   Drug use: No   Sexual activity: Not Currently  Other Topics Concern   Not on file  Social History Narrative   Not on file   Social Determinants of Health   Financial Resource Strain: Not on file  Food Insecurity: Not on file  Transportation Needs: Not on file  Physical Activity: Not on file  Stress: Not on file  Social Connections: Not on file   Family History  Problem Relation Age of Onset   Heart disease  Mother    Diabetes Father    Retinal detachment Brother    Heart disease Brother    Stroke Brother    Diabetes Other        grandparents   Scheduled Meds:  apixaban  5 mg Oral BID   calcium carbonate  1 tablet Oral Q breakfast   And   cholecalciferol  1,000 Units Oral Q breakfast   docusate sodium  100  mg Oral Daily   furosemide  40 mg Intravenous BID   insulin aspart  0-15 Units Subcutaneous TID WC   metoprolol succinate  50 mg Oral Daily   multivitamin with minerals  1 tablet Oral Daily   pantoprazole  40 mg Oral BID   rosuvastatin  10 mg Oral Daily   sertraline  50 mg Oral Daily   Continuous Infusions: PRN Meds:.acetaminophen, ondansetron **OR** ondansetron (ZOFRAN) IV Medications Prior to Admission:  Prior to Admission medications   Medication Sig Start Date End Date Taking? Authorizing Provider  acetaminophen (TYLENOL) 325 MG tablet Take 2 tablets (650 mg total) by mouth every 6 (six) hours as needed for mild pain or headache (fever >/= 101). 09/22/19  Yes Val Riles, MD  apixaban (ELIQUIS) 5 MG TABS tablet Take 1 tablet (5 mg total) by mouth 2 (two) times daily. 04/07/22  Yes Gollan, Kathlene November, MD  Calcium Citrate-Vitamin D (CALCIUM + D PO) Take 1 tablet by mouth daily.    Yes [provider]  famotidine (PEPCID) 20 MG tablet Take 20 mg by mouth at bedtime. 06/07/20  Yes [provider]  FARXIGA 10 MG TABS tablet TAKE 1 TABLET BY MOUTH DAILY 05/07/20  Yes Cannady, Jolene T, NP  flecainide (TAMBOCOR) 50 MG tablet Take 1 tablet (50 mg total) by mouth 2 (two) times daily. 04/07/22  Yes Gollan, Kathlene November, MD  glipiZIDE-metformin (METAGLIP) 5-500 MG tablet TAKE 1 TABLET BY MOUTH TWICE DAILY 08/09/20  Yes Cannady, Jolene T, NP  GLUCOSAMINE-CHONDROITIN PO Take by mouth daily.   Yes [provider]  hydrocortisone 2.5 % cream Apply 1-2 times daily to areas at neck, face, scalp as needed for itch. 04/30/20  Yes Brendolyn Patty, MD  JANUVIA 100 MG tablet TAKE 1  TABLET BY MOUTH DAILY 05/07/20  Yes Cannady, Jolene T, NP  ketoconazole (NIZORAL) 2 % cream once daily to pink scaly patches at face, ears and scalp. 02/05/21  Yes Brendolyn Patty, MD  metoprolol succinate (TOPROL-XL) 50 MG 24 hr tablet Take 1 tablet (50 mg total) by mouth daily. Take with or immediately following a meal. 04/07/22  Yes Gollan, Kathlene November, MD  Multiple Vitamins-Minerals (CEROVITE SENIOR PO) Take 1 tablet by mouth daily.   Yes [provider]  pantoprazole (PROTONIX) 40 MG tablet TAKE 1 TABLET BY MOUTH TWICE DAILY 03/09/20  Yes Cannady, Jolene T, NP  rosuvastatin (CRESTOR) 10 MG tablet Take 1 tablet (10 mg total) by mouth daily. 04/07/22  Yes [name], MD  sertraline (ZOLOFT) 50 MG tablet Take 1 tablet (50 mg total) by mouth daily. 11/17/19  Yes Cannady, Jolene T, NP  STOOL SOFTENER 100 MG capsule Take 100 mg by mouth daily. 10/12/20  Yes [provider]  carbidopa-levodopa (SINEMET IR) 25-100 MG tablet Take 3 tablets by mouth 4 (four) times daily. Patient not taking: Reported on 07/28/2022    [provider]  cyanocobalamin (,VITAMIN B-12,) 1000 MCG/ML injection INJECT 1ML ONCE A WEEK FOR 14 DAYS (2 WEEKS)  and  THEN RETURN TO MONTHLY FREQUENCY 06/21/20   [provider]  traMADol (ULTRAM) 50 MG tablet Take 1 tablet (50 mg total) by mouth every 6 (six) hours as needed. Patient not taking: Reported on 07/28/2022 05/08/21   Harvest Dark, MD  UNABLE TO FIND Take 100 mg by mouth daily. CBD '100mg'$     [provider]  [place] Name: Gaston    [provider]   No Known Allergies  Review of Systems  Respiratory:  Positive for shortness of breath.     Physical Exam Pulmonary:     Effort: Pulmonary effort is normal.  Neurological:     Mental Status: He is alert.     Vital Signs: BP (!) 118/59   Pulse 71   Temp 97.7 F (36.5 C) (Oral)   Resp (!) 22   Ht '5\' 8"'$  (1.727 m)   Wt 86.2 kg   SpO2 100%   BMI  28.89 kg/m  Pain Scale: 0-10   Pain Score: 0-No pain   SpO2: SpO2: 100 % O2 Device:SpO2: 100 % O2 Flow Rate: .   IO: Intake/output summary:  Intake/Output Summary (Last 24 hours) at 07/29/2022 1610 Last data filed at 07/29/2022 6803 Gross per 24 hour  Intake --  Output 750 ml  Net -750 ml    LBM:   Baseline Weight: Weight: 86.2 kg Most recent weight: Weight: 86.2 kg      Asencion Gowda, NP   Please contact Palliative Medicine Team phone at [phone] for questions and concerns.  For individual provider: See Shea Evans

## 2022-07-29 NOTE — ED Notes (Signed)
Pt placed on bedpan per request, pt removed. X1 BM in pan pt cleaned, new chucks pads placed under pt. Pt repositioned for comfort, new warm blankets provided. Pt denies further needs at this time, call light remains within reach. WCTM.

## 2022-07-30 ENCOUNTER — Other Ambulatory Visit: Payer: Self-pay

## 2022-07-30 DIAGNOSIS — E1169 Type 2 diabetes mellitus with other specified complication: Secondary | ICD-10-CM | POA: Diagnosis not present

## 2022-07-30 DIAGNOSIS — I5031 Acute diastolic (congestive) heart failure: Secondary | ICD-10-CM | POA: Diagnosis not present

## 2022-07-30 DIAGNOSIS — Z7189 Other specified counseling: Secondary | ICD-10-CM | POA: Diagnosis not present

## 2022-07-30 DIAGNOSIS — I350 Nonrheumatic aortic (valve) stenosis: Secondary | ICD-10-CM | POA: Diagnosis not present

## 2022-07-30 DIAGNOSIS — I480 Paroxysmal atrial fibrillation: Secondary | ICD-10-CM | POA: Diagnosis not present

## 2022-07-30 LAB — GLUCOSE, CAPILLARY
Glucose-Capillary: 152 mg/dL — ABNORMAL HIGH (ref 70–99)
Glucose-Capillary: 169 mg/dL — ABNORMAL HIGH (ref 70–99)
Glucose-Capillary: 242 mg/dL — ABNORMAL HIGH (ref 70–99)
Glucose-Capillary: 229 mg/dL — ABNORMAL HIGH (ref 70–99)
Glucose-Capillary: 183 mg/dL — ABNORMAL HIGH (ref 70–99)

## 2022-07-30 LAB — BASIC METABOLIC PANEL
Anion gap: 13 (ref 5–15)
BUN: 22 mg/dL (ref 8–23)
CO2: 23 mmol/L (ref 22–32)
Calcium: 8 mg/dL — ABNORMAL LOW (ref 8.9–10.3)
Chloride: 99 mmol/L (ref 98–111)
Creatinine, Ser: 1.28 mg/dL — ABNORMAL HIGH (ref 0.61–1.24)
GFR, Estimated: 53 mL/min — ABNORMAL LOW (ref >60)
Glucose, Bld: 167 mg/dL — ABNORMAL HIGH (ref 70–99)
Potassium: 3.3 mmol/L — ABNORMAL LOW (ref 3.5–5.1)
Sodium: 135 mmol/L (ref 135–145)

## 2022-07-30 MED ORDER — POTASSIUM CHLORIDE CRYS ER 20 MEQ PO TBCR
40.0000 meq | EXTENDED_RELEASE_TABLET | Freq: Once | ORAL | Status: AC
  Administered 2022-07-30: 40 meq via ORAL
  Filled 2022-07-30: qty 2

## 2022-07-30 MED ORDER — METOPROLOL SUCCINATE ER 25 MG PO TB24
12.5000 mg | ORAL_TABLET | Freq: Every day | ORAL | Status: DC
  Administered 2022-07-30 – 2022-08-04 (×6): 12.5 mg via ORAL
  Filled 2022-07-30 (×6): qty 1

## 2022-07-30 MED ORDER — FUROSEMIDE 40 MG PO TABS
40.0000 mg | ORAL_TABLET | Freq: Two times a day (BID) | ORAL | Status: DC
  Administered 2022-07-30 – 2022-08-01 (×4): 40 mg via ORAL
  Filled 2022-07-30 (×4): qty 1

## 2022-07-30 NOTE — TOC Progression Note (Addendum)
Transition of Care [facility]) - Progression Note    Patient Details  Name: Timothy [name] MRN: [id number] Date of Birth: [birth year]  [facility] CM/SW Contact  Laurena Slimmer, RN Phone Number: 07/30/2022, 4:04 PM  Clinical Narrative:    Spoke with patient, and his daughter Timothy [name] and bedside. His daughter Timothy [name] was on the phone. Patient is agreeable to SNF. The family has chosen [facility] as their first choice, [facility] as second and third choices. Family would no prefer [facility] or [facility]. They have been advised the facility makes a determination on if patient can be accepted based on bed availability, patient's needs and if insurance can be accepted.  FL2 completed. Bed search initiated.         Expected Discharge Plan and Services                                               Social Determinants of Health (SDOH) Interventions SDOH Screenings   Food Insecurity: No Food Insecurity (07/30/2022)  Housing: Low Risk  (07/30/2022)  Transportation Needs: No Transportation Needs (07/30/2022)  Utilities: Not At Risk (07/30/2022)  Tobacco Use: Low Risk  (07/28/2022)    Readmission Risk Interventions     No data to display

## 2022-07-30 NOTE — Progress Notes (Signed)
Daily Progress Note   Patient Name: Timothy [name]       Date: 07/30/2022 DOB: [birth year]  Age: 89 y.o. MRN#: [id number] Attending Physician: Ezekiel Slocumb, DO Primary Care Physician: Orvis Brill, [facility] Admit Date: 07/28/2022  Reason for Consultation/Follow-up: Establishing goals of care  Subjective: Notes and labs reviewed.  In to see patient.  Patient is currently sitting in a semiprivate room with daughter at bedside preparing his lunch.  They state that their questions have been answered and they are able to articulate the plan moving forward.  Continue plans for treating the treatable.  Length of Stay: 2  Current Medications: Scheduled Meds:   apixaban  5 mg Oral BID   calcium carbonate  1 tablet Oral Q breakfast   And   cholecalciferol  1,000 Units Oral Q breakfast   docusate sodium  100 mg Oral Daily   furosemide  40 mg Oral BID   insulin aspart  0-15 Units Subcutaneous TID WC   metoprolol succinate  12.5 mg Oral Daily   multivitamin with minerals  1 tablet Oral Daily   pantoprazole  40 mg Oral BID   potassium chloride  40 mEq Oral Once   rosuvastatin  10 mg Oral Daily   sertraline  50 mg Oral Daily    Continuous Infusions:   PRN Meds: acetaminophen, ondansetron **OR** ondansetron (ZOFRAN) IV  Physical Exam Pulmonary:     Effort: Pulmonary effort is normal.  Neurological:     Mental Status: He is alert.             Vital Signs: BP (!) 94/56 (BP Location: Left Arm)   Pulse 73   Temp (!) 97.4 F (36.3 C)   Resp 18   Ht '5\' 8"'$  (1.727 m)   Wt 86.2 kg   SpO2 91%   BMI 28.89 kg/m  SpO2: SpO2: 91 % O2 Device: O2 Device: Room Air O2 Flow Rate:    Intake/output summary:  Intake/Output Summary (Last 24 hours) at 07/30/2022 1415 Last data filed at  07/30/2022 1106 Gross per 24 hour  Intake 511.19 ml  Output 900 ml  Net -388.81 ml   LBM: Last BM Date : 07/29/22 Baseline Weight: Weight: 86.2 kg Most recent weight: Weight: 86.2 kg    Patient Active Problem List   Diagnosis Date  Noted   Parkinsonian features 07/29/2022   Generalized weakness 07/29/2022   AKI (acute kidney injury) ([place]) 07/29/2022   Myocardial injury 95/62/1308   Acute diastolic CHF (congestive heart failure) ([place]) 07/28/2022   Chest pain at rest 07/28/2022   Acute respiratory failure with hypoxia ([place])    Pneumonia due to COVID-19 virus 09/18/2019   PAD (peripheral artery disease) (HCC)    Moderate aortic stenosis    AAA (abdominal aortic aneurysm) (HCC)    (HFpEF) heart failure with preserved ejection fraction ([place])    Type 2 diabetes mellitus with hyperlipidemia ([place])    Parkinson disease 06/14/2018   Behavioral change 03/25/2018   Symptomatic severe aortic stenosis with normal ejection fraction 02/18/2018   Dysphagia 12/10/2017   Advanced care planning/counseling discussion 11/27/2016   Pernicious anemia 05/28/2015   Leg weakness, bilateral 04/12/2015   Tremor 03/27/2015   Chronic anxiety 02/20/2015   SVT (supraventricular tachycardia) 02/16/2015   Paroxysmal atrial fibrillation ([place]) 02/16/2015   History of back surgery 02/16/2015   TIA (transient ischemic attack) 12/29/2011   Hyperlipidemia associated with type 2 diabetes mellitus ([place]) 09/24/2011   Essential hypertension 09/24/2011    Palliative Care Assessment & Plan   Recommendations/Plan:  DNR/DNI.  Continue to treat the treatable.    Recommend outpatient palliative with transition to hospice when they are ready.    Surrogate decision maker: Patient advised yesterday that oldest daughter [name], and his youngest daughter are his HPOA's if a surrogate decision maker is needed.  PMT will sign off please reconsult for needs    Code Status:    Code Status Orders  (From admission,  onward)           Start     Ordered   07/28/22 0949  Do not attempt resuscitation (DNR)  Continuous       Question Answer Comment  If patient has no pulse and is not breathing Do Not Attempt Resuscitation   If patient has a pulse and/or is breathing: Medical Treatment Goals LIMITED ADDITIONAL INTERVENTIONS: Use medication/IV fluids and cardiac monitoring as indicated; Do not use intubation or mechanical ventilation (DNI), also provide comfort medications.  Transfer to Progressive/Stepdown as indicated, avoid Intensive Care.   Consent: Discussion documented in EHR or advanced directives reviewed      07/28/22 0949           Code Status History     Date Active Date Inactive Code Status Order ID Comments User Context   09/18/2019 1441 09/22/2019 2214 Full Code [id number]  Lorella Nimrod, MD ED   05/15/2018 2104 05/18/2018 1744 Partial Code [id number]  Sela Hua, MD Inpatient   01/31/2018 1751 02/02/2018 2038 Full Code [id number]  Demetrios Loll, MD Inpatient       Prognosis:  < 6 months   Thank you for allowing the Palliative Medicine Team to assist in the care of this patient.    Asencion Gowda, NP  Please contact Palliative Medicine Team phone at [phone] for questions and concerns.

## 2022-07-30 NOTE — Progress Notes (Addendum)
Progress Note   Patient: Timothy [name] GGE:[id number] DOB: 30-Nov-1928 DOA: 07/28/2022     2 DOS: the patient was seen and examined on 07/30/2022   Brief hospital course: 89 y.o. male with medical history significant for hypertension, diabetes mellitus type 2 on oral agents, paroxysmal A-fib on Eliquis, chronic diastolic dysfunction CHF, peripheral arterial disease, Parkinson's disease, severe aortic stenosis (aortic valve area by VTI measures 0.87 cm2.  Aortic valve mean gradient measures 40.0 mmHg) seen and evaluated by cardiac surgery and deemed not a surgical candidate due to advanced Parkinson's disease, advanced age and decreased functional status who presents to the ER via EMS for evaluation of chest pain and worsening shortness of breath. Patient's daughter notes that he has had worsening shortness of breath over several days and this was noted by his caregiver who observed that with minimal exertion patient was out of breath and needed time to get back to his baseline.  He also complains of midsternal chest pain with radiation to his left arm that he has had intermittently for 3 days.  Denies having any nausea, no vomiting, no diaphoresis or palpitations and chest pain improved after he received aspirin by EMS. Notes that shortness of breath occurs with both rest and exertion and he is unable to lay flat in bed.  He does have some mild lower extremity swelling. Daughter notes that he has episodes of syncope/near syncope mostly when having bowel movements and this happens regardless of whether he has constipation or diarrhea.  Patient denies straining to have bowel movements. He is mostly wheelchair-bound and will sometimes use a rolling walker to get around his apartment He denies having any cough, no fever, no chills, no abdominal pain, no dizziness, no lightheadedness, no urinary symptoms, no blurred vision no focal deficit. Abnormal labs include uptrending troponin 49 >> 60, BNP 904 Chest  x-ray reviewed by me shows findings suggestive of mild CHF Twelve-lead EKG reviewed by me shows sinus rhythm with first-degree AV block.  Left axis deviation and a right bundle branch block. Patient received a dose of Lasix 40 mg IV and will be admitted to the hospital for further evaluation.  Assessment and Plan: * Acute diastolic CHF (congestive heart failure) (HCC) With history of severe aortic stenosis.  Mortality is high with in the next year.  Treated with IV Lasix, transitioning to PO today. Continue Toprol.   Echo completed, results below  Symptomatic severe aortic stenosis with normal ejection fraction Not candidate for invasive procedure (TAVR or SAVR)  Cardiology following.   Palliative care consulted given poor prognosis and high risk of mortality  Chest pain at rest Right-sided chest pain.  Could be shingles without a rash.  Continue to monitor if a rash develops.  X-ray showing old fractures of the seventh and eighth lateral ribs.  Will check an SPEP.  Unlikely pulmonary embolism with being on Eliquis.  Myocardial injury Elevated troponin likely demand ischemia from heart failure.  AKI (acute kidney injury) ([place]) Watch closely with diuresis.  Creatinine 1.35 on presentation down to 1.29.  Previous creatinine a few months ago 1.01.  Generalized weakness PT and OT consultations.  Parkinsonian features PT and OT consultation  Type 2 diabetes mellitus with hyperlipidemia (HCC) Last hemoglobin A1c 7.7.  On sliding scale.  Holding oral hypoglycemics.  On Crestor.  Parkinson disease Continue Sinemet  Paroxysmal atrial fibrillation (HCC) Continue Toprol for rate control and Eliquis for anticoagulation.  Flecainide discontinued by cardiology with heart failure.  Subjective: Patient sleeping comfortably when seen this morning, daughter at bedside.  She reports he had a very busy day yesterday and needs to get up and sleep.  Patient wakes easily to voice and ports  feeling well, better.  Denies acute complaints.  Physical Exam: Vitals:   07/30/22 0106 07/30/22 0439 07/30/22 0905 07/30/22 1134  BP: (!) 115/56 (!) 94/57 112/66 (!) 94/56  Pulse: 76 69 71 73  Resp: '20 16 20 18  '$ Temp: 97.9 F (36.6 C) 97.8 F (36.6 C) 97.7 F (36.5 C) (!) 97.4 F (36.3 C)  TempSrc:      SpO2: 91% 94% 97% 91%  Weight:      Height:       General exam: Sleeping comfortably, wakes easily to voice, no acute distress HEENT: atraumatic, clear conjunctiva, anicteric sclera, moist mucus membranes, hearing grossly normal  Respiratory system: CTAB, no wheezes, rales or rhonchi, normal respiratory effort. Cardiovascular system: normal T2/P4, RRR, 2/6 systolic murmur, no pedal edema.   Gastrointestinal system: soft, NT, ND, no HSM felt, +bowel sounds. Central nervous system:. no gross focal neurologic deficits, normal speech Extremities: moves all, no edema, normal tone Skin: dry, intact, normal temperature Psychiatry: normal mood, congruent affect, judgement and insight appear normal    Data Reviewed: Notable labs: Potassium 3.3, glucose 167, creatinine stable 1.28 from 1.29, calcium 8.0, GFR 53  Echocardiogram 07/29/2022: LVEF 60 to 65%, moderate LVH, indeterminate diastolic parameters, moderate MR, mild AR, severe AS    Family Communication: Spoke with daughter at the bedside  Disposition: Status is: Inpatient Remains inpatient appropriate because: Treating congestive heart failure with aortic stenosis.  Patient having right-sided chest pain  Planned Discharge Destination: Likely home with home health    Time spent: 35 minutes  Author: Ezekiel Slocumb, DO 07/30/2022 3:57 PM  For on call review [email or web address].

## 2022-07-30 NOTE — Consult Note (Signed)
   Heart Failure Nurse Navigator Note  HFpEF 60 to 65%.  Moderate left ventricular hypertrophy.  Indeterminate diastolic parameters.  Moderate mitral regurgitation.  Severe aortic stenosis with a valve area of 0.43 cm.  He presented to the emergency room with complaints of chest pain, shortness of breath at rest, orthopnea and lower extremity edema.  BNP was 904.  Chest x-ray revealed mild CHF.  Comorbidities:  Aortic stenosis Type 2 diabetes Parkinson's Paroxysmal atrial fibrillation Right bundle branch block  Medications:  Lasix 40 mg 2 times a day Metoprolol succinate 12.5 mg daily Rosuvastatin 10 mg daily Torsemide 20 mg IV 2 times a day  Labs:  Sodium 135, potassium 3.3, chloride 99, CO2 23, BUN 22, creatinine 1.28, estimated GFR 53.  Weight not documented Intake 271 mL Output 750 mL  Initial meeting with patient and his daughter on this admission.  Patient was asleep on his side, in no acute distress.  Daughter states that he had a pretty rough day yesterday with all the doctors that saw him and visitors.  States that her father lives at [facility], and assisted living with his wife who has dementia.  She states that the staff at [facility] assisted him with his medications and ADLs.  Also receives his meals from the facility.  Discussed the importance of low-sodium diet and removing the saltshaker from the table.  Discussed the importance of fluid restriction of no more than 64 ounces daily and doing daily weights.  Reporting weight gain of 2 to 3 pounds overnight or 5 pounds in 1 week to physicians.  Daughter requests that I not speak with the patient's wife who suffers with dementia.  She was given the living with heart failure teaching booklet, zone magnet, info on heart failure and low-sodium along with weight chart.  He has follow-up in the outpatient heart failure clinic on January 31 at 3:30 in the afternoon.  He has a 13% no-show which is 11 out of 86  appointments.  She had no further questions, we will continue to follow along.  Pricilla Riffle RN CHFN

## 2022-07-30 NOTE — Plan of Care (Signed)
  Problem: Education: Goal: Ability to demonstrate management of disease process will improve Outcome: Progressing   Problem: Cardiac: Goal: Ability to achieve and maintain adequate cardiopulmonary perfusion will improve Outcome: Progressing   Problem: Education: Goal: Knowledge of General Education information will improve Description: Including pain rating scale, medication(s)/side effects and non-pharmacologic comfort measures Outcome: Progressing   Problem: Health Behavior/Discharge Planning: Goal: Ability to manage health-related needs will improve Outcome: Progressing

## 2022-07-30 NOTE — NC FL2 (Signed)
[place] LEVEL OF CARE FORM     IDENTIFICATION  Patient Name: Timothy [name] Birthdate: 30-Apr-1929 Sex: male Admission Date (Current Location): 07/28/2022  [facility] and [place] Number:      Facility and Address:  [facility], [address] [place], [place] [address]      Provider Number: [id number]  Attending Physician Name and Address:  Ezekiel Slocumb, DO  Relative Name and Phone Number:  Glea Haithcock,[phone]    Current Level of Care: Hospital Recommended Level of Care: [place] Prior Approval Number:    Date Approved/Denied:   PASRR Number: [id number]  Discharge Plan: SNF    Current Diagnoses: Patient Active Problem List   Diagnosis Date Noted   Parkinsonian features 07/29/2022   Generalized weakness 07/29/2022   AKI (acute kidney injury) ([place]) 07/29/2022   Myocardial injury 95/18/8416   Acute diastolic CHF (congestive heart failure) ([place]) 07/28/2022   Chest pain at rest 07/28/2022   Acute respiratory failure with hypoxia ([place])    Pneumonia due to COVID-19 virus 09/18/2019   PAD (peripheral artery disease) (HCC)    Moderate aortic stenosis    AAA (abdominal aortic aneurysm) (HCC)    (HFpEF) heart failure with preserved ejection fraction (HCC)    Type 2 diabetes mellitus with hyperlipidemia ([place])    Parkinson disease 06/14/2018   Behavioral change 03/25/2018   Symptomatic severe aortic stenosis with normal ejection fraction 02/18/2018   Dysphagia 12/10/2017   Advanced care planning/counseling discussion 11/27/2016   Pernicious anemia 05/28/2015   Leg weakness, bilateral 04/12/2015   Tremor 03/27/2015   Chronic anxiety 02/20/2015   SVT (supraventricular tachycardia) 02/16/2015   Paroxysmal atrial fibrillation ([place]) 02/16/2015   History of back surgery 02/16/2015   TIA (transient ischemic attack) 12/29/2011   Hyperlipidemia associated with type 2 diabetes mellitus ([place]) 09/24/2011   Essential  hypertension 09/24/2011    Orientation RESPIRATION BLADDER Height & Weight     Self, Time, Situation  Normal External catheter Weight: 86.2 kg Height:  '5\' 8"'$  (172.7 cm)  BEHAVIORAL SYMPTOMS/MOOD NEUROLOGICAL BOWEL NUTRITION STATUS  Other (Comment) (n/a)  (n/a) Continent Diet (2gm sodium)  AMBULATORY STATUS COMMUNICATION OF NEEDS Skin   Limited Assist Verbally Normal                       Personal Care Assistance Level of Assistance  Bathing, Feeding, Dressing Bathing Assistance: Limited assistance Feeding assistance: Limited assistance Dressing Assistance: Limited assistance     Functional Limitations Info  Sight Sight Info: Impaired        SPECIAL CARE FACTORS FREQUENCY  PT (By licensed PT), OT (By licensed OT)     PT Frequency: Min 2x weekly OT Frequency: Min 2xweekly            Contractures Contractures Info: Not present    Additional Factors Info  Code Status, Allergies Code Status Info: DNR Allergies Info: No Known Allergies           Current Medications (07/30/2022):  This is the current hospital active medication list Current Facility-Administered Medications  Medication Dose Route Frequency Provider Last Rate Last Admin   acetaminophen (TYLENOL) tablet 650 mg  650 mg Oral Q6H PRN Agbata, Tochukwu, MD       apixaban (ELIQUIS) tablet 5 mg  5 mg Oral BID Agbata, Tochukwu, MD   5 mg at 07/30/22 0920   calcium carbonate (OS-CAL - dosed in mg of elemental calcium) tablet 1,250 mg  1  tablet Oral Q breakfast Benita Gutter, RPH   1,250 mg at 07/30/22 1426   And   cholecalciferol (VITAMIN D3) 25 MCG (1000 UNIT) tablet 1,000 Units  1,000 Units Oral Q breakfast Benita Gutter, RPH   1,000 Units at 07/30/22 0920   docusate sodium (COLACE) capsule 100 mg  100 mg Oral Daily Agbata, Tochukwu, MD   100 mg at 07/30/22 0920   furosemide (LASIX) tablet 40 mg  40 mg Oral BID Kate Sable, MD       insulin aspart (novoLOG) injection 0-15 Units  0-15 Units  Subcutaneous TID WC Agbata, Tochukwu, MD   5 Units at 07/30/22 1304   metoprolol succinate (TOPROL-XL) 24 hr tablet 12.5 mg  12.5 mg Oral Daily Agbor-Etang, Aaron Edelman, MD   12.5 mg at 07/30/22 0920   multivitamin with minerals tablet 1 tablet  1 tablet Oral Daily Agbata, Tochukwu, MD   1 tablet at 07/30/22 0920   ondansetron (ZOFRAN) tablet 4 mg  4 mg Oral Q6H PRN Agbata, Tochukwu, MD       Or   ondansetron (ZOFRAN) injection 4 mg  4 mg Intravenous Q6H PRN Agbata, Tochukwu, MD       pantoprazole (PROTONIX) EC tablet 40 mg  40 mg Oral BID Agbata, Tochukwu, MD   40 mg at 07/30/22 0920   rosuvastatin (CRESTOR) tablet 10 mg  10 mg Oral Daily Agbata, Tochukwu, MD   10 mg at 07/30/22 0920   sertraline (ZOLOFT) tablet 50 mg  50 mg Oral Daily Agbata, Tochukwu, MD   50 mg at 07/30/22 0920     Discharge Medications: Please see discharge summary for a list of discharge medications.  Relevant Imaging Results:  Relevant Lab Results:   Additional Information SSN: [id number]  Laurena Slimmer, RN

## 2022-07-30 NOTE — Progress Notes (Signed)
Rounding Note    Patient Name: Timothy [name] Date of Encounter: 07/30/2022  [place] Cardiologist: Ida Rogue, MD   Subjective   No chest pain, no shortness of breath, blood pressures running low, systolic 20N.  Inpatient Medications    Scheduled Meds:  apixaban  5 mg Oral BID   calcium carbonate  1 tablet Oral Q breakfast   And   cholecalciferol  1,000 Units Oral Q breakfast   docusate sodium  100 mg Oral Daily   furosemide  40 mg Oral BID   insulin aspart  0-15 Units Subcutaneous TID WC   metoprolol succinate  12.5 mg Oral Daily   multivitamin with minerals  1 tablet Oral Daily   pantoprazole  40 mg Oral BID   rosuvastatin  10 mg Oral Daily   sertraline  50 mg Oral Daily   Continuous Infusions:  PRN Meds: acetaminophen, ondansetron **OR** ondansetron (ZOFRAN) IV   Vital Signs    Vitals:   07/30/22 0106 07/30/22 0439 07/30/22 0905 07/30/22 1134  BP: (!) 115/56 (!) 94/57 112/66 (!) 94/56  Pulse: 76 69 71 73  Resp: '20 16 20 18  '$ Temp: 97.9 F (36.6 C) 97.8 F (36.6 C) 97.7 F (36.5 C) (!) 97.4 F (36.3 C)  TempSrc:      SpO2: 91% 94% 97% 91%  Weight:      Height:        Intake/Output Summary (Last 24 hours) at 07/30/2022 1205 Last data filed at 07/30/2022 1106 Gross per 24 hour  Intake 511.19 ml  Output 900 ml  Net -388.81 ml      07/28/2022    4:32 AM 04/27/2022   10:03 AM 04/07/2022    4:22 PM  Last 3 Weights  Weight (lbs) 190 lb 0.6 oz 188 lb 181 lb  Weight (kg) 86.2 kg 85.276 kg 82.101 kg      Telemetry    Sinus rhythm- Personally Reviewed  ECG     - Personally Reviewed  Physical Exam   GEN: No acute distress.   Neck: No JVD Cardiac: RRR, 2/6 systolic murmur Respiratory: Poor inspiratory effort, diminished breath sounds at bases, no wheezing GI: Soft, nontender, non-distended  MS: No edema; No deformity. Neuro:  Nonfocal  Psych: Normal affect   Labs    High Sensitivity Troponin:   Recent Labs  Lab  07/28/22 0633 07/28/22 0957 07/28/22 1144 07/28/22 1404 07/28/22 1630  TROPONINIHS 60* 166* 211* 372* 499*     Chemistry Recent Labs  Lab 07/28/22 0433 07/29/22 0501 07/30/22 0329  NA 135 136 135  K 3.7 3.6 3.3*  CL 104 102 99  CO2 21* 23 23  GLUCOSE 181* 138* 167*  BUN '21 21 22  '$ CREATININE 1.35* 1.29* 1.28*  CALCIUM 8.4* 8.3* 8.0*  GFRNONAA 50* 52* 53*  ANIONGAP '10 11 13    '$ Lipids No results for input(s): "CHOL", "TRIG", "HDL", "LABVLDL", "LDLCALC", "CHOLHDL" in the last 168 hours.  Hematology Recent Labs  Lab 07/28/22 0433 07/29/22 0501  WBC 9.2 7.8  RBC 3.35* 3.29*  HGB 9.7* 9.5*  HCT 31.5* 30.6*  MCV 94.0 93.0  MCH 29.0 28.9  MCHC 30.8 31.0  RDW 15.7* 15.9*  PLT 204 219   Thyroid No results for input(s): "TSH", "FREET4" in the last 168 hours.  BNP Recent Labs  Lab 07/28/22 0800  BNP 904.2*    DDimer No results for input(s): "DDIMER" in the last 168 hours.   Radiology    ECHOCARDIOGRAM  COMPLETE  Result Date: 07/29/2022    ECHOCARDIOGRAM REPORT   Patient Name:   ADRIEN SHANKAR Date of Exam: 07/29/2022 Medical Rec #:  [id number]    Height:       68.0 in Accession #:    [id number]   Weight:       190.0 lb Date of Birth:  [birth year]   BSA:          2.000 m Patient Age:    89 years     BP:           95/52 mmHg Patient Gender: M            HR:           64 bpm. Exam Location:  [facility] Procedure: 2D Echo, Color Doppler and Cardiac Doppler Indications:     CHF-acute systolic I96.78  History:         Patient has prior history of Echocardiogram examinations, most                  recent 08/07/2020. Risk Factors:Hypertension and Diabetes.                  Persistent Afib.  Sonographer:     Sherrie Sport Referring Phys:  [id number] AGBATA Diagnosing Phys: Kathlyn Sacramento MD  Sonographer Comments: Suboptimal apical window and no subcostal window. IMPRESSIONS  1. Left ventricular ejection fraction, by estimation, is 60 to 65%. The left ventricle has normal function. The left  ventricle has no regional wall motion abnormalities. There is moderate left ventricular hypertrophy. Left ventricular diastolic parameters are indeterminate.  2. Right ventricular systolic function is normal. The right ventricular size is normal. Tricuspid regurgitation signal is inadequate for assessing PA pressure.  3. The mitral valve is normal in structure. Moderate mitral valve regurgitation. No evidence of mitral stenosis.  4. The aortic valve is calcified. There is severe calcifcation of the aortic valve. There is severe thickening of the aortic valve. Aortic valve regurgitation is mild. Severe aortic valve stenosis. Aortic regurgitation PHT measures 477 msec. Aortic valve area, by VTI measures 0.43 cm. Aortic valve mean gradient measures 29.0 mmHg. FINDINGS  Left Ventricle: Left ventricular ejection fraction, by estimation, is 60 to 65%. The left ventricle has normal function. The left ventricle has no regional wall motion abnormalities. The left ventricular internal cavity size was normal in size. There is  moderate left ventricular hypertrophy. Left ventricular diastolic parameters are indeterminate. Right Ventricle: The right ventricular size is normal. No increase in right ventricular wall thickness. Right ventricular systolic function is normal. Tricuspid regurgitation signal is inadequate for assessing PA pressure. Left Atrium: Left atrial size was normal in size. Right Atrium: Right atrial size was normal in size. Pericardium: There is no evidence of pericardial effusion. Mitral Valve: The mitral valve is normal in structure. Moderate mitral valve regurgitation. No evidence of mitral valve stenosis. Tricuspid Valve: The tricuspid valve is normal in structure. Tricuspid valve regurgitation is not demonstrated. No evidence of tricuspid stenosis. Aortic Valve: The aortic valve is calcified. There is severe calcifcation of the aortic valve. There is severe thickening of the aortic valve. Aortic valve  regurgitation is mild. Aortic regurgitation PHT measures 477 msec. Severe aortic stenosis is present. Aortic valve mean gradient measures 29.0 mmHg. Aortic valve peak gradient measures 37.8 mmHg. Aortic valve area, by VTI measures 0.43 cm. Pulmonic Valve: The pulmonic valve was normal in structure. Pulmonic valve regurgitation is not visualized. No evidence of pulmonic stenosis.  Aorta: The aortic root is normal in size and structure. Venous: The inferior vena cava was not well visualized. IAS/Shunts: No atrial level shunt detected by color flow Doppler.  LEFT VENTRICLE PLAX 2D LVIDd:         3.20 cm   Diastology LVIDs:         2.30 cm   LV e' medial:  4.03 cm/s LV PW:         1.30 cm   LV e' lateral: 5.11 cm/s LV IVS:        1.70 cm LVOT diam:     2.00 cm LV SV:         27 LV SV Index:   14 LVOT Area:     3.14 cm  RIGHT VENTRICLE RV Basal diam:  3.10 cm RV Mid diam:    3.10 cm RV S prime:     9.46 cm/s TAPSE (M-mode): 1.5 cm LEFT ATRIUM             Index        RIGHT ATRIUM           Index LA diam:        3.90 cm 1.95 cm/m   RA Area:     11.20 cm LA Vol (A2C):   30.2 ml 15.10 ml/m  RA Volume:   21.20 ml  10.60 ml/m LA Vol (A4C):   23.5 ml 11.75 ml/m LA Biplane Vol: 28.5 ml 14.25 ml/m  AORTIC VALVE AV Area (Vmax):    0.47 cm AV Area (Vmean):   0.48 cm AV Area (VTI):     0.43 cm AV Vmax:           307.60 cm/s AV Vmean:          224.400 cm/s AV VTI:            0.646 m AV Peak Grad:      37.8 mmHg AV Mean Grad:      29.0 mmHg LVOT Vmax:         46.30 cm/s LVOT Vmean:        34.200 cm/s LVOT VTI:          0.088 m LVOT/AV VTI ratio: 0.14 AI PHT:            477 msec  AORTA Ao Root diam: 2.95 cm TRICUSPID VALVE TR Peak grad:   10.9 mmHg TR Vmax:        165.00 cm/s  SHUNTS Systemic VTI:  0.09 m Systemic Diam: 2.00 cm Kathlyn Sacramento MD Electronically signed by Kathlyn Sacramento MD Signature Date/Time: 07/29/2022/4:10:42 PM    Final    DG Ribs Unilateral Right  Result Date: 07/29/2022 CLINICAL DATA:  Right rib pain.  EXAM: RIGHT RIBS - 2 VIEW COMPARISON:  Chest radiographs 07/28/2022 and 04/27/2022. CT chest 04/27/2022. FINDINGS: AP and oblique views of the right ribs are submitted, without an accompanying chest radiograph. Old fractures of the right 7th and 8th ribs anterolaterally are stable. No evidence of acute fracture or focal rib lesion. There is right lung atelectasis without pneumothorax or significant pleural effusion. Overall right lung aeration appears improved from recent chest radiographs. IMPRESSION: No evidence of acute right rib fracture or focal rib lesion. Old right rib fractures. Electronically Signed   By: Richardean Sale M.D.   On: 07/29/2022 12:27    Cardiac Studies   TTE 07/29/2022 1. Left ventricular ejection fraction, by estimation, is 60 to 65%. The  left ventricle has normal  function. The left ventricle has no regional  wall motion abnormalities. There is moderate left ventricular hypertrophy.  Left ventricular diastolic  parameters are indeterminate.   2. Right ventricular systolic function is normal. The right ventricular  size is normal. Tricuspid regurgitation signal is inadequate for assessing  PA pressure.   3. The mitral valve is normal in structure. Moderate mitral valve  regurgitation. No evidence of mitral stenosis.   4. The aortic valve is calcified. There is severe calcifcation of the  aortic valve. There is severe thickening of the aortic valve. Aortic valve  regurgitation is mild. Severe aortic valve stenosis. Aortic regurgitation  PHT measures 477 msec. Aortic  valve area, by VTI measures 0.43 cm. Aortic valve mean gradient measures  29.0 mmHg.    Patient Profile     89 y.o. male with history of severe aortic stenosis being seen for presyncope, chest pain and severe valvular disease.  Assessment & Plan    Severe AS -Not candidate for invasive procedure (TAVR or SAVR) -Switch IV Lasix to oral -BP low, reduce Toprol-XL to 12.5 mg daily -Prognosis remain  poor with severe AS.  Explained to daughter and wife at bedside  2.  Paroxysmal A-fib -Maintaining sinus rhythm -Toprol-XL, Eliquis.  No additional cardiac testing or intervention planned.  Recommended palliative care for goals of care discussion with family.  Total encounter time more than 50 minutes  Greater than 50% was spent in counseling and coordination of care with the patient     Signed, Kate Sable, MD  07/30/2022, 12:05 PM

## 2022-07-31 DIAGNOSIS — I5031 Acute diastolic (congestive) heart failure: Secondary | ICD-10-CM | POA: Diagnosis not present

## 2022-07-31 DIAGNOSIS — J810 Acute pulmonary edema: Secondary | ICD-10-CM | POA: Diagnosis not present

## 2022-07-31 DIAGNOSIS — I350 Nonrheumatic aortic (valve) stenosis: Secondary | ICD-10-CM | POA: Diagnosis not present

## 2022-07-31 DIAGNOSIS — R079 Chest pain, unspecified: Secondary | ICD-10-CM | POA: Diagnosis not present

## 2022-07-31 DIAGNOSIS — R29818 Other symptoms and signs involving the nervous system: Secondary | ICD-10-CM | POA: Diagnosis not present

## 2022-07-31 DIAGNOSIS — R5381 Other malaise: Secondary | ICD-10-CM

## 2022-07-31 LAB — GLUCOSE, CAPILLARY
Glucose-Capillary: 191 mg/dL — ABNORMAL HIGH (ref 70–99)
Glucose-Capillary: 336 mg/dL — ABNORMAL HIGH (ref 70–99)
Glucose-Capillary: 301 mg/dL — ABNORMAL HIGH (ref 70–99)
Glucose-Capillary: 340 mg/dL — ABNORMAL HIGH (ref 70–99)
Glucose-Capillary: 151 mg/dL — ABNORMAL HIGH (ref 70–99)
Glucose-Capillary: 143 mg/dL — ABNORMAL HIGH (ref 70–99)

## 2022-07-31 LAB — BASIC METABOLIC PANEL
Anion gap: 9 (ref 5–15)
BUN: 20 mg/dL (ref 8–23)
CO2: 25 mmol/L (ref 22–32)
Calcium: 8.3 mg/dL — ABNORMAL LOW (ref 8.9–10.3)
Chloride: 100 mmol/L (ref 98–111)
Creatinine, Ser: 1.33 mg/dL — ABNORMAL HIGH (ref 0.61–1.24)
GFR, Estimated: 50 mL/min — ABNORMAL LOW (ref >60)
Glucose, Bld: 162 mg/dL — ABNORMAL HIGH (ref 70–99)
Potassium: 3.3 mmol/L — ABNORMAL LOW (ref 3.5–5.1)
Sodium: 134 mmol/L — ABNORMAL LOW (ref 135–145)

## 2022-07-31 LAB — MAGNESIUM: Magnesium: 2 mg/dL (ref 1.7–2.4)

## 2022-07-31 MED ORDER — INSULIN ASPART 100 UNIT/ML IJ SOLN
2.0000 [IU] | Freq: Three times a day (TID) | INTRAMUSCULAR | Status: DC
  Administered 2022-07-31 – 2022-08-01 (×3): 2 [IU] via SUBCUTANEOUS
  Filled 2022-07-31 (×3): qty 1

## 2022-07-31 NOTE — Progress Notes (Addendum)
Rounding Note    Patient Name: Timothy [name] Date of Encounter: 07/31/2022  [facility] Cardiologist: Ida Rogue, MD   Subjective   Sitting up in recliner, comfortable, no complaints Discussion with patient and daughter as well as family at the bedside Previous living at [facility], now needing higher level of care Does not walk very much, spends most of his time in a wheelchair No chest pain or shortness of breath today Blood pressure and heart rate stable  Inpatient Medications    Scheduled Meds:  apixaban  5 mg Oral BID   calcium carbonate  1 tablet Oral Q breakfast   And   cholecalciferol  1,000 Units Oral Q breakfast   docusate sodium  100 mg Oral Daily   furosemide  40 mg Oral BID   insulin aspart  0-15 Units Subcutaneous TID WC   insulin aspart  2 Units Subcutaneous TID WC   metoprolol succinate  12.5 mg Oral Daily   multivitamin with minerals  1 tablet Oral Daily   pantoprazole  40 mg Oral BID   rosuvastatin  10 mg Oral Daily   sertraline  50 mg Oral Daily   Continuous Infusions:  PRN Meds: acetaminophen, ondansetron **OR** ondansetron (ZOFRAN) IV   Vital Signs    Vitals:   07/30/22 2323 07/31/22 0436 07/31/22 0854 07/31/22 1216  BP: 117/63 (!) 116/58 121/62 117/62  Pulse: 75 74 78 80  Resp: '20 18 20 '$ (!) 22  Temp: 98.4 F (36.9 C) 98.5 F (36.9 C) (!) 97.5 F (36.4 C) 97.9 F (36.6 C)  TempSrc: Oral  Oral   SpO2: 99% 96% 99% 98%  Weight:      Height:        Intake/Output Summary (Last 24 hours) at 07/31/2022 1322 Last data filed at 07/31/2022 1226 Gross per 24 hour  Intake --  Output 1900 ml  Net -1900 ml      07/28/2022    4:32 AM 04/27/2022   10:03 AM 04/07/2022    4:22 PM  Last 3 Weights  Weight (lbs) 190 lb 0.6 oz 188 lb 181 lb  Weight (kg) 86.2 kg 85.276 kg 82.101 kg      Telemetry    NSR - Personally Reviewed  ECG     - Personally Reviewed  Physical Exam   GEN: No acute distress.   Neck: No JVD Cardiac:  RRR, 3/6 SEM RSB , no rubs, or gallops.  Respiratory: Clear to auscultation bilaterally. GI: Soft, nontender, non-distended  MS: No edema; No deformity. Neuro:  Nonfocal  Psych: Normal affect   Labs    High Sensitivity Troponin:   Recent Labs  Lab 07/28/22 0633 07/28/22 0957 07/28/22 1144 07/28/22 1404 07/28/22 1630  TROPONINIHS 60* 166* 211* 372* 499*     Chemistry Recent Labs  Lab 07/29/22 0501 07/30/22 0329 07/31/22 0517  NA 136 135 134*  K 3.6 3.3* 3.3*  CL 102 99 100  CO2 '23 23 25  '$ GLUCOSE 138* 167* 162*  BUN '21 22 20  '$ CREATININE 1.29* 1.28* 1.33*  CALCIUM 8.3* 8.0* 8.3*  MG  --   --  2.0  GFRNONAA 52* 53* 50*  ANIONGAP '11 13 9    '$ Lipids No results for input(s): "CHOL", "TRIG", "HDL", "LABVLDL", "LDLCALC", "CHOLHDL" in the last 168 hours.  Hematology Recent Labs  Lab 07/28/22 0433 07/29/22 0501  WBC 9.2 7.8  RBC 3.35* 3.29*  HGB 9.7* 9.5*  HCT 31.5* 30.6*  MCV 94.0 93.0  MCH 29.0 28.9  MCHC 30.8 31.0  RDW 15.7* 15.9*  PLT 204 219   Thyroid No results for input(s): "TSH", "FREET4" in the last 168 hours.  BNP Recent Labs  Lab 07/28/22 0800  BNP 904.2*    DDimer No results for input(s): "DDIMER" in the last 168 hours.   Radiology    ECHOCARDIOGRAM COMPLETE  Result Date: 07/29/2022    ECHOCARDIOGRAM REPORT   Patient Name:   ELLIE SPICKLER Date of Exam: 07/29/2022 Medical Rec #:  [id number]    Height:       68.0 in Accession #:    [id number]   Weight:       190.0 lb Date of Birth:  09-06-1928   BSA:          2.000 m Patient Age:    10 years     BP:           95/52 mmHg Patient Gender: M            HR:           64 bpm. Exam Location:  [facility] Procedure: 2D Echo, Color Doppler and Cardiac Doppler Indications:     CHF-acute systolic N39.76  History:         Patient has prior history of Echocardiogram examinations, most                  recent 08/07/2020. Risk Factors:Hypertension and Diabetes.                  Persistent Afib.  Sonographer:     Sherrie Sport  Referring Phys:  [id number] AGBATA Diagnosing Phys: Kathlyn Sacramento MD  Sonographer Comments: Suboptimal apical window and no subcostal window. IMPRESSIONS  1. Left ventricular ejection fraction, by estimation, is 60 to 65%. The left ventricle has normal function. The left ventricle has no regional wall motion abnormalities. There is moderate left ventricular hypertrophy. Left ventricular diastolic parameters are indeterminate.  2. Right ventricular systolic function is normal. The right ventricular size is normal. Tricuspid regurgitation signal is inadequate for assessing PA pressure.  3. The mitral valve is normal in structure. Moderate mitral valve regurgitation. No evidence of mitral stenosis.  4. The aortic valve is calcified. There is severe calcifcation of the aortic valve. There is severe thickening of the aortic valve. Aortic valve regurgitation is mild. Severe aortic valve stenosis. Aortic regurgitation PHT measures 477 msec. Aortic valve area, by VTI measures 0.43 cm. Aortic valve mean gradient measures 29.0 mmHg. FINDINGS  Left Ventricle: Left ventricular ejection fraction, by estimation, is 60 to 65%. The left ventricle has normal function. The left ventricle has no regional wall motion abnormalities. The left ventricular internal cavity size was normal in size. There is  moderate left ventricular hypertrophy. Left ventricular diastolic parameters are indeterminate. Right Ventricle: The right ventricular size is normal. No increase in right ventricular wall thickness. Right ventricular systolic function is normal. Tricuspid regurgitation signal is inadequate for assessing PA pressure. Left Atrium: Left atrial size was normal in size. Right Atrium: Right atrial size was normal in size. Pericardium: There is no evidence of pericardial effusion. Mitral Valve: The mitral valve is normal in structure. Moderate mitral valve regurgitation. No evidence of mitral valve stenosis. Tricuspid Valve: The  tricuspid valve is normal in structure. Tricuspid valve regurgitation is not demonstrated. No evidence of tricuspid stenosis. Aortic Valve: The aortic valve is calcified. There is severe calcifcation of the aortic valve. There is severe thickening of  the aortic valve. Aortic valve regurgitation is mild. Aortic regurgitation PHT measures 477 msec. Severe aortic stenosis is present. Aortic valve mean gradient measures 29.0 mmHg. Aortic valve peak gradient measures 37.8 mmHg. Aortic valve area, by VTI measures 0.43 cm. Pulmonic Valve: The pulmonic valve was normal in structure. Pulmonic valve regurgitation is not visualized. No evidence of pulmonic stenosis. Aorta: The aortic root is normal in size and structure. Venous: The inferior vena cava was not well visualized. IAS/Shunts: No atrial level shunt detected by color flow Doppler.  LEFT VENTRICLE PLAX 2D LVIDd:         3.20 cm   Diastology LVIDs:         2.30 cm   LV e' medial:  4.03 cm/s LV PW:         1.30 cm   LV e' lateral: 5.11 cm/s LV IVS:        1.70 cm LVOT diam:     2.00 cm LV SV:         27 LV SV Index:   14 LVOT Area:     3.14 cm  RIGHT VENTRICLE RV Basal diam:  3.10 cm RV Mid diam:    3.10 cm RV S prime:     9.46 cm/s TAPSE (M-mode): 1.5 cm LEFT ATRIUM             Index        RIGHT ATRIUM           Index LA diam:        3.90 cm 1.95 cm/m   RA Area:     11.20 cm LA Vol (A2C):   30.2 ml 15.10 ml/m  RA Volume:   21.20 ml  10.60 ml/m LA Vol (A4C):   23.5 ml 11.75 ml/m LA Biplane Vol: 28.5 ml 14.25 ml/m  AORTIC VALVE AV Area (Vmax):    0.47 cm AV Area (Vmean):   0.48 cm AV Area (VTI):     0.43 cm AV Vmax:           307.60 cm/s AV Vmean:          224.400 cm/s AV VTI:            0.646 m AV Peak Grad:      37.8 mmHg AV Mean Grad:      29.0 mmHg LVOT Vmax:         46.30 cm/s LVOT Vmean:        34.200 cm/s LVOT VTI:          0.088 m LVOT/AV VTI ratio: 0.14 AI PHT:            477 msec  AORTA Ao Root diam: 2.95 cm TRICUSPID VALVE TR Peak grad:   10.9 mmHg  TR Vmax:        165.00 cm/s  SHUNTS Systemic VTI:  0.09 m Systemic Diam: 2.00 cm Kathlyn Sacramento MD Electronically signed by Kathlyn Sacramento MD Signature Date/Time: 07/29/2022/4:10:42 PM    Final     Cardiac Studies     Patient Profile     89 y.o. male with history of severe aortic stenosis being seen for presyncope, chest pain and severe valvular disease.   Assessment & Plan    Severe aortic valve stenosis Not a candidate for TAVR or SAVR secondary to her age, debility, Parkinson's. Continue oral Lasix tomorrow succinate 12.5 daily Palliative following  Paroxysmal atrial lesion Off  Flecainide, by Dr. Fletcher Anon on admission Continue metoprolol succinate and Eliquis  Debility Requesting rehab  Discussion with patient and family at bedside  Total encounter time more than 50 minutes  Greater than 50% was spent in counseling and coordination of care with the patient  [place] will sign off.   Medication Recommendations: No further medication changes Other recommendations (labs, testing, etc): No further testing Follow up as an outpatient: Outpatient cardiology as needed  For questions or updates, please contact [place] Please consult [email or web address] for contact info under        Signed, Ida Rogue, MD  07/31/2022, 1:22 PM

## 2022-07-31 NOTE — TOC Progression Note (Signed)
Transition of Care [facility]) - Progression Note    Patient Details  Name: Timothy [name] MRN: [id number] Date of Birth: [birth year]  Transition of Care [facility]) CM/SW Contact  Laurena Slimmer, RN Phone Number: 07/31/2022, 3:47 PM  Clinical Narrative:    Damaris Schooner with Robb Matar. Referral resent to [facility].        Expected Discharge Plan and Services                                               Social Determinants of Health (SDOH) Interventions SDOH Screenings   Food Insecurity: No Food Insecurity (07/30/2022)  Housing: Low Risk  (07/30/2022)  Transportation Needs: No Transportation Needs (07/30/2022)  Utilities: Not At Risk (07/30/2022)  Tobacco Use: Low Risk  (07/28/2022)    Readmission Risk Interventions     No data to display

## 2022-07-31 NOTE — TOC Progression Note (Signed)
Transition of Care [facility]) - Progression Note    Patient Details  Name: Timothy [name] MRN: [id number] Date of Birth: 12/25/1928  Transition of Care [facility]) CM/SW Contact  Laurena Slimmer, RN Phone Number: 07/31/2022, 3:01 PM  Clinical Narrative:    Spoke with patient's daughter [name] to give bed offers. She was provided with bed offers for [facility], [facility], and [facility]. She inquired about an bed offer for [facility]. Glea was advised patient did not receive a bed offer from [facility] due to facility being unable to meet patient's needs per the H[facility]. Glea stated she would call to speak with someone.  She was provided the name for [facility] Admission Coordinator. [name] will speak with Seth Bake prior to making a decision.         Expected Discharge Plan and Services                                               Social Determinants of Health (SDOH) Interventions SDOH Screenings   Food Insecurity: No Food Insecurity (07/30/2022)  Housing: Low Risk  (07/30/2022)  Transportation Needs: No Transportation Needs (07/30/2022)  Utilities: Not At Risk (07/30/2022)  Tobacco Use: Low Risk  (07/28/2022)    Readmission Risk Interventions     No data to display

## 2022-07-31 NOTE — Progress Notes (Addendum)
Occupational Therapy Treatment Patient Details Name: Timothy [name] MRN: [id number] DOB: 09/11/1928 Today's Date: 07/31/2022   History of present illness Pt is a 89 y.o. male presenting to hospital 07/28/22 with c/o chest pain and SOB.  Pt admitted with acute diastolic CHF, symptomatic severe aortic stenosis, and chest pain at rest (midsternal with radiation to L shoulder).  PMH includes htn, HLD, DM, severe aortic stenosis, a-fib on Eliquis, Parkinson's disease, AAA, Paget's disease, PAD, TIA, back surgery.   OT comments  Pt received semi-reclined in bed; daughter in room and supportive. Appearing alert; willing to work with OT on transfers and ADLs. T/f with HHA x2 MOD A at first, then with RW able to t/f MIN A to stand from [facility] at end of session. See flowsheet below for further details of session. Left in recliner, daughter in room, chair alarm on with all needs in reach. Pt on room air today and saturation remaining in mid-high 90's. Patient will benefit from continued OT while in acute care.   Recommendations for follow up therapy are one component of a multi-disciplinary discharge planning process, led by the attending physician.  Recommendations may be updated based on patient status, additional functional criteria and insurance authorization.    Follow Up Recommendations  Skilled nursing-short term rehab (<3 hours/day)     Assistance Recommended at Discharge Frequent or constant Supervision/Assistance  Patient can return home with the following  Assistance with cooking/housework;Assist for transportation;Help with stairs or ramp for entrance;A lot of help with walking and/or transfers;A lot of help with bathing/dressing/bathroom;Direct supervision/assist for medications management;Direct supervision/assist for financial management   Equipment Recommendations  Other (comment) (defer to next venue of care)    Recommendations for Other Services      Precautions / Restrictions  Precautions Precautions: Fall Precaution Comments: patient has Parkinsons disease Restrictions Weight Bearing Restrictions: No       Mobility Bed Mobility Overal bed mobility: Needs Assistance Bed Mobility: Supine to Sit     Supine to sit: Supervision, HOB elevated (cues for hand placement and technique; much increased time, but no hands-on assist to EOB today)          Transfers Overall transfer level: Needs assistance Equipment used: 2 person hand held assist, 1 person hand held assist, Rolling walker (2 wheels)               General transfer comment: three techniques used throughout session today. First t/f from EOB with bed slightly elevated HHA MOD A x2. Second stand MOD A x1 handheld assist. Third stand again MOD A x1 HHA, t/f to [facility]. Fourth stand MIN A from [facility] with RW. Gait belt utilized for safety during all transfers.     Balance Overall balance assessment: Needs assistance Sitting-balance support: Feet supported Sitting balance-Leahy Scale: Good     Standing balance support: Bilateral upper extremity supported Standing balance-Leahy Scale: Poor                             ADL either performed or assessed with clinical judgement   ADL Overall ADL's : Needs assistance/impaired Eating/Feeding:  (daughter was dependently feeding pt when OT arrived; however, pt does have UE capacity to pick up items and bring to mouth)   Grooming: Brushing hair;Oral care;Set up;Sitting (at EOB) Grooming Details (indicate cue type and reason): decreased speed; OT holding spit basin for pt.  Toilet Transfer: Moderate assistance;Stand-pivot;BSC/3in1 Toilet Transfer Details (indicate cue type and reason): t/f to [facility] MOD A HHA x1 (with seond person stabilizing BSC) at EOB; pt had bowel movement. Toileting- Clothing Manipulation and Hygiene: Sit to/from stand;Maximal assistance Toileting - Clothing Manipulation Details (indicate cue type and  reason): OT assisted pt with bowel hygiene in standing position (CGA-MIN A in standing from PT for safety; pt standing at RW)            Extremity/Trunk Assessment Upper Extremity Assessment Upper Extremity Assessment: Generalized weakness (mild tremors and decreased coordination 2/2 parkinsons)            Vision       Perception     Praxis      Cognition Arousal/Alertness: Awake/alert Behavior During Therapy: WFL for tasks assessed/performed Overall Cognitive Status: Within Functional Limits for tasks assessed                                 General Comments: slow processing, but alert and follows all cues; cooperative and kind.        Exercises      Shoulder Instructions       General Comments Pt on room air today; no shortness of breath noted.    Pertinent Vitals/ Pain       Pain Assessment Pain Assessment: No/denies pain  Home Living                                          Prior Functioning/Environment              Frequency  Min 2X/week        Progress Toward Goals  OT Goals(current goals can now be found in the care plan section)  Progress towards OT goals: Progressing toward goals  Acute Rehab OT Goals Patient Stated Goal: get better OT Goal Formulation: With patient/family Time For Goal Achievement: 08/12/22 Potential to Achieve Goals: Fair ADL Goals Pt Will Perform Grooming: with set-up;sitting;with supervision Pt Will Perform Lower Body Dressing: with min assist;sit to/from stand Pt Will Transfer to Toilet: stand pivot transfer;bedside commode;with min assist Pt Will Perform Toileting - Clothing Manipulation and hygiene: with min assist;sit to/from stand  Plan Discharge plan remains appropriate    Co-evaluation    PT/OT/SLP Co-Evaluation/Treatment: Yes Reason for Co-Treatment: Complexity of the patient's impairments (multi-system involvement);For patient/therapist safety   OT goals addressed  during session: ADL's and self-care      AM-PAC OT "6 Clicks" Daily Activity     Outcome Measure   Help from another person eating meals?: None Help from another person taking care of personal grooming?: A Little Help from another person toileting, which includes using toliet, bedpan, or urinal?: A Lot Help from another person bathing (including washing, rinsing, drying)?: A Lot Help from another person to put on and taking off regular upper body clothing?: A Little Help from another person to put on and taking off regular lower body clothing?: A Lot 6 Click Score: 16    End of Session Equipment Utilized During Treatment: Gait belt;Rolling walker (2 wheels)  OT Visit Diagnosis: Unsteadiness on feet (R26.81);Muscle weakness (generalized) (M62.81)   Activity Tolerance Patient limited by fatigue   Patient Left in chair;with chair alarm set;with family/visitor present;with call bell/phone within reach   Nurse Communication Mobility status  Time: 6073-7106 OT Time Calculation (min): 48 min  Charges: OT General Charges $OT Visit: 1 Visit OT Treatments $Self Care/Home Management : 23-37 mins  Waymon Amato, MS, OTR/L   Vania Rea 07/31/2022, 11:41 AM

## 2022-07-31 NOTE — Progress Notes (Signed)
Physical Therapy Treatment Patient Details Name: Timothy [name] MRN: [id number] DOB: [birth year] Today's Date: 07/31/2022   History of Present Illness Pt is a 89 y.o. male presenting to hospital 07/28/22 with c/o chest pain and SOB.  Pt admitted with acute diastolic CHF, symptomatic severe aortic stenosis, and chest pain at rest (midsternal with radiation to L shoulder).  PMH includes htn, HLD, DM, severe aortic stenosis, a-fib on Eliquis, Parkinson's disease, AAA, Paget's disease, PAD, TIA, back surgery.    PT Comments    Pt is making gradual progress towards goals and is agreeable to session. PT/OT co-treat this date. Pt able to sit at EOB for prolonged time for ADLs and eventually transfer to/from [facility] and then recliner. Education given to family in regards to self feeding and performing OOB mobility. Secure chat sent to RN staff about mobility status and family concerns about not being "left up too long". Will continue to progress as able.   Recommendations for follow up therapy are one component of a multi-disciplinary discharge planning process, led by the attending physician.  Recommendations may be updated based on patient status, additional functional criteria and insurance authorization.  Follow Up Recommendations  Skilled nursing-short term rehab (<3 hours/day) Can patient physically be transported by private vehicle: No   Assistance Recommended at Discharge Frequent or constant Supervision/Assistance  Patient can return home with the following Two people to help with walking and/or transfers;Two people to help with bathing/dressing/bathroom;Assistance with cooking/housework;Assist for transportation;Help with stairs or ramp for entrance   Equipment Recommendations  None recommended by PT    Recommendations for Other Services       Precautions / Restrictions Precautions Precautions: Fall Precaution Comments: patient has Parkinsons disease Restrictions Weight Bearing  Restrictions: No     Mobility  Bed Mobility Overal bed mobility: Needs Assistance Bed Mobility: Supine to Sit     Supine to sit: Supervision, HOB elevated     General bed mobility comments: increased time, needs cues for sequencing    Transfers Overall transfer level: Needs assistance Equipment used: 2 person hand held assist, 1 person hand held assist, Rolling walker (2 wheels) Transfers: Sit to/from Stand Sit to Stand: Mod assist           General transfer comment: several reps to stand with RW and HHA. Bed elevated prior to transfer. Does better when pushing from arm rests. Forward flexed posture during standing    Ambulation/Gait Ambulation/Gait assistance: Mod assist, +2 physical assistance Gait Distance (Feet): 3 Feet Assistive device: Rolling walker (2 wheels) Gait Pattern/deviations: Step-to pattern       General Gait Details: festinating gait pattern with difficulty with stepping. Needs assist for weight shifting. Trial with HHA and RW and pt improves with use of RW. Also performed several side steps towards HOB.   Stairs             Wheelchair Mobility    Modified Rankin (Stroke Patients Only)       Balance Overall balance assessment: Needs assistance Sitting-balance support: Feet supported Sitting balance-Leahy Scale: Good     Standing balance support: Bilateral upper extremity supported Standing balance-Leahy Scale: Poor                              Cognition Arousal/Alertness: Awake/alert Behavior During Therapy: WFL for tasks assessed/performed Overall Cognitive Status: Within Functional Limits for tasks assessed  General Comments: follows commands consistently, soft spoken        Exercises Other Exercises Other Exercises: Able to sit at EOB for ADLs. Than required +2 assist for transfer to [facility] for soft BM. Needs Max assist for hygiene and additional assist for standing  balance.    General Comments General comments (skin integrity, edema, etc.): pt currently on RA with sats at 94%-99% and HR WNL.      Pertinent Vitals/Pain Pain Assessment Pain Assessment: No/denies pain    Home Living                          Prior Function            PT Goals (current goals can now be found in the care plan section) Acute Rehab PT Goals Patient Stated Goal: to improve breathing and mobility PT Goal Formulation: With patient/family Time For Goal Achievement: 08/12/22 Potential to Achieve Goals: Fair Progress towards PT goals: Progressing toward goals    Frequency    Min 2X/week      PT Plan Current plan remains appropriate    Co-evaluation PT/OT/SLP Co-Evaluation/Treatment: Yes Reason for Co-Treatment: Complexity of the patient's impairments (multi-system involvement) PT goals addressed during session: Mobility/safety with mobility;Strengthening/ROM OT goals addressed during session: ADL's and self-care      AM-PAC PT "6 Clicks" Mobility   Outcome Measure  Help needed turning from your back to your side while in a flat bed without using bedrails?: A Little Help needed moving from lying on your back to sitting on the side of a flat bed without using bedrails?: A Little Help needed moving to and from a bed to a chair (including a wheelchair)?: A Lot Help needed standing up from a chair using your arms (e.g., wheelchair or bedside chair)?: A Lot Help needed to walk in hospital room?: Total Help needed climbing 3-5 steps with a railing? : Total 6 Click Score: 12    End of Session Equipment Utilized During Treatment: Gait belt Activity Tolerance: Patient tolerated treatment well Patient left: in chair;with chair alarm set Nurse Communication: Mobility status;Precautions PT Visit Diagnosis: Other abnormalities of gait and mobility (R26.89);Muscle weakness (generalized) (M62.81)     Time: 0865-7846 PT Time Calculation (min) (ACUTE  ONLY): 47 min  Charges:  $Therapeutic Activity: 8-22 mins                     Greggory Stallion, PT, DPT, GCS [phone]    Timothy [name] 07/31/2022, 2:15 PM

## 2022-07-31 NOTE — Care Management Important Message (Signed)
Important Message  Patient Details  Name: Timothy [name] MRN: [id number] Date of Birth: [birth year]   Medicare Important Message Given:  Yes     Dannette Barbara 07/31/2022, 2:53 PM

## 2022-07-31 NOTE — Inpatient Diabetes Management (Signed)
Inpatient Diabetes Program Recommendations  AACE/ADA: New Consensus Statement on Inpatient Glycemic Control (2015)  Target Ranges:  Prepandial:   less than 140 mg/dL      Peak postprandial:   less than 180 mg/dL (1-2 hours)      Critically ill patients:  140 - 180 mg/dL   Lab Results  Component Value Date   GLUCAP 336 (H) 07/31/2022   HGBA1C 7.7 (H) 07/28/2022    Latest Reference Range & Units 07/30/22 08:06 07/30/22 11:18 07/30/22 17:02 07/30/22 21:18 07/31/22 08:53 07/31/22 12:20  Glucose-Capillary 70 - 99 mg/dL 169 (H) 242 (H) 229 (H) 183 (H) 191 (H) 336 (H)  (H): Data is abnormally high  Diabetes history: DM2 Outpatient Diabetes medications: Farxiga 10, Glip/Met 5/500 bid, Januvia 100 qd  Current orders for Inpatient glycemic control: Novolog 0-15 units tid correction  Inpatient Diabetes Program Recommendations:   While oral DM meds held in the hospital and postprandial CBGs>180, please consider: -Add Novolog 2 units tid meal coverage if eats 50% meals  Thank you, Nani Gasser. Kimila Papaleo, RN, MSN, CDE  Diabetes Coordinator Inpatient Glycemic Control Team Team Pager [phone] (8am-5pm) 07/31/2022 12:45 PM

## 2022-07-31 NOTE — Progress Notes (Signed)
Progress Note   Patient: Timothy [name] GNF:[id number] DOB: Jan 18, 1929 DOA: 07/28/2022     3 DOS: the patient was seen and examined on 07/31/2022   Brief hospital course: 89 y.o. male with medical history significant for hypertension, diabetes mellitus type 2 on oral agents, paroxysmal A-fib on Eliquis, chronic diastolic dysfunction CHF, peripheral arterial disease, Parkinson's disease, severe aortic stenosis (aortic valve area by VTI measures 0.87 cm2.  Aortic valve mean gradient measures 40.0 mmHg) seen and evaluated by cardiac surgery and deemed not a surgical candidate due to advanced Parkinson's disease, advanced age and decreased functional status who presents to the ER via EMS for evaluation of chest pain and worsening shortness of breath. Patient's daughter notes that he has had worsening shortness of breath over several days and this was noted by his caregiver who observed that with minimal exertion patient was out of breath and needed time to get back to his baseline.  He also complains of midsternal chest pain with radiation to his left arm that he has had intermittently for 3 days.  Denies having any nausea, no vomiting, no diaphoresis or palpitations and chest pain improved after he received aspirin by EMS. Notes that shortness of breath occurs with both rest and exertion and he is unable to lay flat in bed.  He does have some mild lower extremity swelling. Daughter notes that he has episodes of syncope/near syncope mostly when having bowel movements and this happens regardless of whether he has constipation or diarrhea.  Patient denies straining to have bowel movements. He is mostly wheelchair-bound and will sometimes use a rolling walker to get around his apartment He denies having any cough, no fever, no chills, no abdominal pain, no dizziness, no lightheadedness, no urinary symptoms, no blurred vision no focal deficit. Abnormal labs include uptrending troponin 49 >> 60, BNP 904 Chest  x-ray reviewed by me shows findings suggestive of mild CHF Twelve-lead EKG reviewed by me shows sinus rhythm with first-degree AV block.  Left axis deviation and a right bundle branch block. Patient received a dose of Lasix 40 mg IV and will be admitted to the hospital for further evaluation.  Assessment and Plan: * Acute diastolic CHF (congestive heart failure) (HCC) With history of severe aortic stenosis.  Mortality is high with in the next year.  Treated with IV Lasix, transitioning to PO today. Continue Toprol.   Echo completed, results below  Symptomatic severe aortic stenosis with normal ejection fraction Not candidate for invasive procedure (TAVR or SAVR)  Cardiology following.   Palliative care consulted given poor prognosis and high risk of mortality  Chest pain Resolved. Pt had Right-sided chest pain.  Could be shingles without a rash.  Continue to monitor if a rash develops.  X-ray showing old fractures of the seventh and eighth lateral ribs.  Unlikely pulmonary embolism with being on Eliquis. Follow pending SPEP  Acute pulmonary edema ([place]) .  Myocardial injury Elevated troponin likely demand ischemia from heart failure.  AKI (acute kidney injury) ([place]) Renal function stable with Cr around 1.3, was 1.35 on presentation down to 1.29.  Previous creatinine a few months ago 1.01. Mild AKI due to diuresis which has been de-escalated. Repeat BMP in 1-2 weeks, continue to monitor here.  Debility PT and OT recommend SNF. TOC following for placement.  Parkinsonian features PT and OT consultation  Type 2 diabetes mellitus with hyperlipidemia (HCC) Last hemoglobin A1c 7.7.  On sliding scale.  Holding oral hypoglycemics.  On Crestor.  Parkinson  disease Continue Sinemet  Paroxysmal atrial fibrillation (HCC) Continue Toprol for rate control and Eliquis for anticoagulation.  Flecainide discontinued by cardiology with heart failure.        Subjective: Patient up in  recliner, had just worked with PT. Daughter at bedside.  Pt reports feeling fine, no acute complaints including CP or SOB.  He drifts in and out of sleep while daughter and spoke.  No acute events reported.  His daughter would like for him to be able to rest, and not stay up too long in the chair at one time.   Physical Exam: Vitals:   07/31/22 0436 07/31/22 0854 07/31/22 1216 07/31/22 1602  BP: (!) 116/58 121/62 117/62 (!) 112/56  Pulse: 74 78 80 76  Resp: 18 20 (!) 22 20  Temp: 98.5 F (36.9 C) (!) 97.5 F (36.4 C) 97.9 F (36.6 C) 97.6 F (36.4 C)  TempSrc:  Oral    SpO2: 96% 99% 98% 94%  Weight:      Height:       General exam: up in recliner, sleeping but wakes easily to voice, no acute distress HEENT: moist mucus membranes, hearing grossly normal  Respiratory system: on room air, normal respiratory effort, CTAB. Cardiovascular system: normal V4/H6, RRR, 2/6 systolic murmur, no pedal edema.   Gastrointestinal system: soft, NT, ND Central nervous system:. no gross focal neurologic deficits, normal speech Extremities: moves all, no edema, normal tone Skin: dry, intact, normal temperature Psychiatry: normal mood, congruent affect    Data Reviewed: Notable labs: K 3.3, Na 134, glucose 162, Cr 1.33 from 1.28 overall stable,  CBG's 183 >> 191 >> 336 >> 301  Echocardiogram 07/29/2022: LVEF 60 to 65%, moderate LVH, indeterminate diastolic parameters, moderate MR, mild AR, severe AS    Family Communication: Spoke with daughter at the bedside  Disposition: Status is: Inpatient Remains inpatient appropriate because: Needs SNF placement. Medically stable.   Planned Discharge Destination: SNF     Time spent: 35 minutes  Author: Ezekiel Slocumb, DO 07/31/2022 4:22 PM  For on call review [email or web address].

## 2022-08-01 DIAGNOSIS — E871 Hypo-osmolality and hyponatremia: Secondary | ICD-10-CM | POA: Diagnosis not present

## 2022-08-01 DIAGNOSIS — E876 Hypokalemia: Secondary | ICD-10-CM

## 2022-08-01 DIAGNOSIS — I5031 Acute diastolic (congestive) heart failure: Secondary | ICD-10-CM | POA: Diagnosis not present

## 2022-08-01 HISTORY — DX: Hypokalemia: E87.6

## 2022-08-01 LAB — GLUCOSE, CAPILLARY
Glucose-Capillary: 193 mg/dL — ABNORMAL HIGH (ref 70–99)
Glucose-Capillary: 305 mg/dL — ABNORMAL HIGH (ref 70–99)
Glucose-Capillary: 312 mg/dL — ABNORMAL HIGH (ref 70–99)
Glucose-Capillary: 242 mg/dL — ABNORMAL HIGH (ref 70–99)

## 2022-08-01 LAB — PROTEIN ELECTROPHORESIS, SERUM
A/G Ratio: 1.2 (ref 0.7–1.7)
Albumin ELP: 3.2 g/dL (ref 2.9–4.4)
Alpha-1-Globulin: 0.2 g/dL (ref 0.0–0.4)
Alpha-2-Globulin: 0.8 g/dL (ref 0.4–1.0)
Beta Globulin: 0.8 g/dL (ref 0.7–1.3)
Gamma Globulin: 0.8 g/dL (ref 0.4–1.8)
Globulin, Total: 2.7 g/dL (ref 2.2–3.9)
Total Protein ELP: 5.9 g/dL — ABNORMAL LOW (ref 6.0–8.5)

## 2022-08-01 LAB — BASIC METABOLIC PANEL
Anion gap: 11 (ref 5–15)
BUN: 21 mg/dL (ref 8–23)
CO2: 24 mmol/L (ref 22–32)
Calcium: 8.2 mg/dL — ABNORMAL LOW (ref 8.9–10.3)
Chloride: 97 mmol/L — ABNORMAL LOW (ref 98–111)
Creatinine, Ser: 1.29 mg/dL — ABNORMAL HIGH (ref 0.61–1.24)
GFR, Estimated: 52 mL/min — ABNORMAL LOW (ref >60)
Glucose, Bld: 170 mg/dL — ABNORMAL HIGH (ref 70–99)
Potassium: 3 mmol/L — ABNORMAL LOW (ref 3.5–5.1)
Sodium: 132 mmol/L — ABNORMAL LOW (ref 135–145)

## 2022-08-01 LAB — CBC
HCT: 30.7 % — ABNORMAL LOW (ref 39.0–52.0)
Hemoglobin: 10 g/dL — ABNORMAL LOW (ref 13.0–17.0)
MCH: 29.2 pg (ref 26.0–34.0)
MCHC: 32.6 g/dL (ref 30.0–36.0)
MCV: 89.8 fL (ref 80.0–100.0)
Platelets: 218 10*3/uL (ref 150–400)
RBC: 3.42 MIL/uL — ABNORMAL LOW (ref 4.22–5.81)
RDW: 15.7 % — ABNORMAL HIGH (ref 11.5–15.5)
WBC: 7 10*3/uL (ref 4.0–10.5)
nRBC: 0.3 % — ABNORMAL HIGH (ref 0.0–0.2)

## 2022-08-01 MED ORDER — POTASSIUM CHLORIDE CRYS ER 20 MEQ PO TBCR
40.0000 meq | EXTENDED_RELEASE_TABLET | ORAL | Status: AC
  Administered 2022-08-01 (×2): 40 meq via ORAL
  Filled 2022-08-01 (×2): qty 2

## 2022-08-01 MED ORDER — FUROSEMIDE 20 MG PO TABS
20.0000 mg | ORAL_TABLET | Freq: Every day | ORAL | Status: DC
  Administered 2022-08-02 – 2022-08-04 (×3): 20 mg via ORAL
  Filled 2022-08-01 (×3): qty 1

## 2022-08-01 MED ORDER — LINAGLIPTIN 5 MG PO TABS
5.0000 mg | ORAL_TABLET | Freq: Every day | ORAL | Status: DC
  Administered 2022-08-02 – 2022-08-04 (×3): 5 mg via ORAL
  Filled 2022-08-01 (×3): qty 1

## 2022-08-01 MED ORDER — POTASSIUM CHLORIDE CRYS ER 20 MEQ PO TBCR
40.0000 meq | EXTENDED_RELEASE_TABLET | Freq: Every day | ORAL | Status: AC
  Administered 2022-08-02 – 2022-08-03 (×2): 40 meq via ORAL
  Filled 2022-08-01 (×2): qty 2

## 2022-08-01 MED ORDER — FUROSEMIDE 40 MG PO TABS: 40.0000 mg | ORAL_TABLET | Freq: Every day | ORAL | Status: DC

## 2022-08-01 MED ORDER — DAPAGLIFLOZIN PROPANEDIOL 10 MG PO TABS
10.0000 mg | ORAL_TABLET | Freq: Every day | ORAL | Status: DC
  Administered 2022-08-02 – 2022-08-04 (×3): 10 mg via ORAL
  Filled 2022-08-01 (×3): qty 1

## 2022-08-01 NOTE — Assessment & Plan Note (Addendum)
Due to diuresis. K has been replaced. K today 4.1 Repeat BMP in 1 week

## 2022-08-01 NOTE — Evaluation (Signed)
Clinical/Bedside Swallow Evaluation Patient Details  Name: Timothy [name] MRN: [id number] Date of Birth: 01-Apr-1929  Today's Date: 08/01/2022 Time: SLP Start Time (ACUTE ONLY): 3267 SLP Stop Time (ACUTE ONLY): 1345 SLP Time Calculation (min) (ACUTE ONLY): 60 min  Past Medical History:  Past Medical History:  Diagnosis Date   (HFpEF) heart failure with preserved ejection fraction ([place])    a. 05/2019 Echo: EF 65-70%, mod LVH. Gr2 DD. Mod AS; b. 08/2020 Echo: EF 65-70%, mod LVH, GrI DD, nl RV fxn, severe AS.   AAA (abdominal aortic aneurysm) ([place])    a. 2014 Abd U/S: 3.5cm AAA; b. 10/20189 Abd U/S: 3.3cm AAA; c. 01/2018 CT Abd/Pelvis: 2.7cm infrarenal AAA - rec f/u U/S in 5 yrs.   Actinic keratosis    Back pain    Basal cell carcinoma 08/22/2019   Left mid ear helix. Nodular pattern, not treated pt was sched for [facility] 09/12/19 and canceled notes in [place], re bx on 03/11/22 bx proven BCC, Mohs 03/26/2022   Basal cell carcinoma 04/30/2020   Left cheek. Superficial and nodular patterns. excised   Coronary artery calcification seen on CT scan    Esophageal reflux    Essential hypertension    History of stress test    a. 01/2012 MV: EF 63%, no rwma. No ischemia/infarct. Low risk.   Mixed hyperlipidemia    Osteitis deformans without mention of bone tumor    Paget's Disease   PAD (peripheral artery disease) ([place])    a. 12/2012 and 05/2013 s/p bilat LE bypass @ [facility] in setting of bilat popliteal aneurysms; b. 05/2019 LE Duplex: R PT 50-74%, Nl L Leg.   Parkinson's disease    Persistent atrial fibrillation ([place])    a. 02/2015 Event monitor: long periods of Afib--prev on flecainide; b. CHA2DS2VASc = 8-->Eliquis 5 bid.   Retinal detachment    Senile osteoporosis    Severe aortic stenosis    a. 05/2019 Echo: Mod AS. Mean grad 29.5 mmHg. Peak grad 55.7 mmHg. AVA 1.28 cm^2 (VTI); b. 08/2020 Echo: EF 65-70%, mod LVH, GrI DD, nl RV fxn, triv MR, mild-mod AI. Sev AS (AVA 0.87cm^2, mean grad 40, AoV Vmax  4.59ms). Mild dil of Ao root.   Skin cancer    Squamous cell carcinoma of skin 04/02/2017   Left cheek. SCCis. Referred for Moh's, clear at appt 06/18/17. Pt opted to treat with 5FU over Mohs.   Swelling of right lower extremity    a. 05/2019 LE U/S: No DVT. 50-64% R PT artery stenosis.   TIA (transient ischemic attack)    [place] II diabetes mellitus [place]    Past Surgical History:  Past Surgical History:  Procedure Laterality Date   APPENDECTOMY     BACK SURGERY     CATARACT EXTRACTION     COLONOSCOPY  2006,2009   ESOPHAGEAL DILATION     GALLBLADDER SURGERY     HERNIA REPAIR     Lower extremity arterial bypass Bilateral    RETINAL DETACHMENT SURGERY     TONSILLECTOMY     HPI:  Pt is a 89y.o. male with medical history significant for hypertension, diabetes mellitus type 2 on oral agents, paroxysmal A-fib on Eliquis, chronic diastolic dysfunction CHF, peripheral arterial disease, Parkinson's disease, GERD/REFLUX, severe aortic stenosis (aortic valve area by VTI measures 0.87 cm2.  Aortic valve mean gradient measures 40.0 mmHg) seen and evaluated by cardiac surgery and deemed not a surgical candidate due to advanced Parkinson's disease, advanced age and decreased functional status  who presents to the ER via EMS for evaluation of chest pain and worsening shortness of breath.  Patient's daughter notes that he has had worsening shortness of breath over several days and this was noted by his caregiver who observed that with minimal exertion patient was out of breath and needed time to get back to his baseline.  He also complains of midsternal chest pain with radiation to his left arm that he has had intermittently for 3 days.  Denies having any nausea, no vomiting, no diaphoresis or palpitations and chest pain improved after he received aspirin by EMS.  Notes that shortness of breath occurs with both rest and exertion and he is unable to lay flat in bed.  He does have some mild lower extremity  swelling.  CXR: revealed CHF.    Assessment / Plan / Recommendation  Clinical Impression   Pt seen today for BSE. Daughter arrived during session. Pt alert/awake, verbal. Low volume of speech, slower motor movements overall -- baseline of Parkinson's Dis. Pt stated he is Not on any medication for Parkinson's Dis.   On [place] O2 2L; WBC wnl. Afebrile.   Pt appears to present w/ grossly functional oropharyngeal phase swallowing w/ No consistent, impactful presentation of oropharyngeal phase dysphagia. Suspect neuromuscular and sensorimotor deficits in setting of Baseline advanced Parkinson's Dis(per chart).  Pt consumed po trials w/ No consistent, immediate, overt clinical s/s of aspiration during po trials. There is risk for aspiration/aspiration pneumonia which can be reduced risk by following general aspiration precautions and using a slightly modified food consistency diet for conservation of energy.   Pt does have challenging factors that could impact oropharyngeal swallowing to include Parkinson's Dis(not on medication for such per pt), fatigue/weakness/deconditioned status in setting of illness/hospitalization, Missing Dentition, and advanced age. These factors can increase risk for aspiration, dysphagia as well as decreased oral intake overall.   During po trials, pt consumed all consistencies w/ no consistent, overt coughing; no decline in vocal quality, or change in respiratory presentation during/post trials. O2 sats remained 97%. Immediate coughing occurred on FIRST po trial of thin liquids(x1 of several trials) but no further coughing followed. Suspect there could be a component of delayed pharyngeal swallow initiation.  Oral phase appeared grossly [facility] w/ timely bolus management, mastication, and control of bolus propulsion for A-P transfer for swallowing. Oral clearing achieved w/ all trial consistencies -- moistened, soft foods given to aid conservation of mastication energy. Of note, noted pt  had min increased Saliva w/ po trials and this continued AFTER completion of po trials during rest though no anterior leakage occurred. Educated pt/Dtr on need to lingual sweep/swallow more frequently. OM Exam appeared grossly [facility] w/ no unilateral weakness noted; mildly decreased lingual strength during protrusion/fluctuation. Slow oral motor movements(and UE motor movements) noted overall. Speech intelligible but Low Volume.  Pt fed self (slowly) w/ setup support.   Recommend a more Mech Soft/Regular consistency diet w/ well-Cut meats, moistened foods and Less Particulate foods in diet such as Salads; Thin liquids VIA CUP ONLY -- pt should Hold Cup when drinking. Recommend general aspiration precautions, tray setup and sitting up support for oral intake/meals. Pills WHOLE in Puree for safer, easier swallowing -- pt is doing this already.   Education given on general swallowing, impact of Parkinson's Dis on swallowing, foods for conservation of energy d/t the impact of Missing Dentition, Pills in Puree, food consistencies and prep, and general aspiration precautions to pt and Dtr. Information given on a Dysphagia  drink cup. Questions answered.   NSG/MD to reconsult if any new needs arise while admitted. No further skilled ST services indicated at this time but monitor pt's Pulmonary status, swallowing moving forward. NSG updated, agreed. MD updated. Recommend Dietician f/u for support and Palliative Care for further education at next venue of care. SLP Visit Diagnosis: Dysphagia, pharyngeal phase (R13.13) (baseline Parkinson's Dis)    Aspiration Risk  Mild aspiration risk;Risk for inadequate nutrition/hydration (reduced following precautions)    Diet Recommendation   a more Mech Soft/Regular consistency diet w/ well-Cut meats, moistened foods and Less Particulate foods in diet such as Salads; Thin liquids VIA CUP ONLY -- pt should Hold Cup when drinking. Recommend general aspiration precautions, tray  setup and sitting up support for oral intake/meals.  Medication Administration: Whole meds with puree    Other  Recommendations Recommended Consults:  (Dietician f/u; Palliative Care f/u for education) Oral Care Recommendations: Oral care BID;Oral care before and after PO;Patient independent with oral care (setup) Other Recommendations:  (n/a)    Recommendations for follow up therapy are one component of a multi-disciplinary discharge planning process, led by the attending physician.  Recommendations may be updated based on patient status, additional functional criteria and insurance authorization.  Follow up Recommendations No SLP follow up (at this time)      Assistance Recommended at Discharge  Intermittent-full d/t Deconditioned State currently  Functional Status Assessment Patient has had a recent decline in their functional status and/or demonstrates limited ability to make significant improvements in function in a reasonable and predictable amount of time  Frequency and Duration  (n/a)   (n/a)       Prognosis Prognosis for Safe Diet Advancement: Fair Barriers to Reach Goals: Time post onset;Severity of deficits Barriers/Prognosis Comment: in setting of missing Dentition, Parkinson's dis. Baseline      Swallow Study   General Date of Onset: 07/28/22 HPI: Pt is a 89 y.o. male with medical history significant for hypertension, diabetes mellitus type 2 on oral agents, paroxysmal A-fib on Eliquis, chronic diastolic dysfunction CHF, peripheral arterial disease, Parkinson's disease, severe aortic stenosis (aortic valve area by VTI measures 0.87 cm2.  Aortic valve mean gradient measures 40.0 mmHg) seen and evaluated by cardiac surgery and deemed not a surgical candidate due to advanced Parkinson's disease, advanced age and decreased functional status who presents to the ER via EMS for evaluation of chest pain and worsening shortness of breath.  Patient's daughter notes that he has had  worsening shortness of breath over several days and this was noted by his caregiver who observed that with minimal exertion patient was out of breath and needed time to get back to his baseline.  He also complains of midsternal chest pain with radiation to his left arm that he has had intermittently for 3 days.  Denies having any nausea, no vomiting, no diaphoresis or palpitations and chest pain improved after he received aspirin by EMS.  Notes that shortness of breath occurs with both rest and exertion and he is unable to lay flat in bed.  He does have some mild lower extremity swelling.  CXR: revealed CHF. Type of Study: Bedside Swallow Evaluation Previous Swallow Assessment: none Diet Prior to this Study: Regular;Thin liquids Temperature Spikes Noted: No (wbc 7.0) Respiratory Status: Nasal cannula (2L) History of Recent Intubation: No Behavior/Cognition: Alert;Cooperative;Pleasant mood (low volume of speeech) Oral Cavity Assessment: Within Functional Limits Oral Care Completed by SLP: Recent completion by staff Oral Cavity - Dentition: Poor condition;Missing dentition Vision:  Functional for self-feeding Self-Feeding Abilities: Able to feed self;Needs assist;Needs set up Patient Positioning: Upright in bed (needed full positioning support) Baseline Vocal Quality: Low vocal intensity (parkinson's dis) Volitional Cough:  (Fair) Volitional Swallow: Able to elicit    Oral/Motor/Sensory Function Overall Oral Motor/Sensory Function: Generalized oral weakness (slight lingual weakness for protrusion) Facial ROM: Within Functional Limits Facial Symmetry: Within Functional Limits Lingual ROM: Within Functional Limits Lingual Symmetry: Within Functional Limits Lingual Strength: Reduced (slight) Mandible: Within Functional Limits   Ice Chips Ice chips: Within functional limits Presentation: Spoon (fed; 2 trials)   Thin Liquid Thin Liquid: Impaired (x1 trial) Presentation: Cup;Self Fed (12+  trials) Oral Phase Impairments:  (adequate) Pharyngeal  Phase Impairments: Suspected delayed Swallow;Cough - Immediate (x1 w/ 12+ trials)    Nectar Thick Nectar Thick Liquid: Not tested   Honey Thick Honey Thick Liquid: Not tested   Puree Puree: Within functional limits Presentation: Spoon;Self Fed (10 trials)   Solid     Solid: Within functional limits (functional) Presentation: Self Fed (5 trials moistened well) Other Comments: missing Dentition which impacts mastication/effort        Orinda Kenner, MS, [place]; [place] [phone] (ascom) Lloyd Ayo 08/01/2022,4:51 PM

## 2022-08-01 NOTE — Progress Notes (Signed)
Physical Therapy Treatment Patient Details Name: Timothy [name] MRN: [id number] DOB: [birth year] Today's Date: 08/01/2022   History of Present Illness Pt is a 89 y.o. male presenting to hospital 07/28/22 with c/o chest pain and SOB.  Pt admitted with acute diastolic CHF, symptomatic severe aortic stenosis, and chest pain at rest (midsternal with radiation to L shoulder).  PMH includes htn, HLD, DM, severe aortic stenosis, a-fib on Eliquis, Parkinson's disease, AAA, Paget's disease, PAD, TIA, back surgery.    PT Comments    Pt was pleasant and motivated to participate during the session and put forth good effort throughout. Pt found on 2LO2/min with SpO2 and HR WNL throughout the session.  Pt required physical assistance and cuing for proper sequencing during transfer training and once up was able to take slow, shuffling steps a max of around 10 feet before fatiguing and needing to return to sitting.  Pt was able to perform multiple short walks with therapeutic rest breaks between them with no adverse symptoms reported.  Pt will benefit from PT services in a SNF setting upon discharge to safely address deficits listed in patient problem list for decreased caregiver assistance and eventual return to PLOF.     Recommendations for follow up therapy are one component of a multi-disciplinary discharge planning process, led by the attending physician.  Recommendations may be updated based on patient status, additional functional criteria and insurance authorization.  Follow Up Recommendations  Skilled nursing-short term rehab (<3 hours/day) Can patient physically be transported by private vehicle: No   Assistance Recommended at Discharge Frequent or constant Supervision/Assistance  Patient can return home with the following Two people to help with walking and/or transfers;Two people to help with bathing/dressing/bathroom;Assistance with cooking/housework;Assist for transportation;Help with stairs or ramp  for entrance;A lot of help with walking and/or transfers;A lot of help with bathing/dressing/bathroom   Equipment Recommendations  None recommended by PT    Recommendations for Other Services       Precautions / Restrictions Precautions Precautions: Fall Precaution Comments: h/o Parkinson's disease Restrictions Weight Bearing Restrictions: No     Mobility  Bed Mobility               General bed mobility comments: NT, pt in recliner    Transfers Overall transfer level: Needs assistance Equipment used: Rolling walker (2 wheels) Transfers: Sit to/from Stand Sit to Stand: Mod assist           General transfer comment: Mod A and mod verbal cues for hand placement and increased trunk flexion    Ambulation/Gait Ambulation/Gait assistance: Min assist Gait Distance (Feet): 10 Feet x 3 Assistive device: Rolling walker (2 wheels) Gait Pattern/deviations: Step-through pattern, Decreased step length - right, Decreased step length - left, Trunk flexed, Shuffle Gait velocity: decreased     General Gait Details: Pt able to amb 3 x 10 feet with very slow, shuffling steps and heavy trunk flexion with min A for stability and to guide the RW   Stairs             Wheelchair Mobility    Modified Rankin (Stroke Patients Only)       Balance Overall balance assessment: Needs assistance Sitting-balance support: Feet supported Sitting balance-Leahy Scale: Good     Standing balance support: Bilateral upper extremity supported, During functional activity, Reliant on assistive device for balance Standing balance-Leahy Scale: Poor Standing balance comment: Min A for stability in standing  Cognition Arousal/Alertness: Awake/alert Behavior During Therapy: WFL for tasks assessed/performed Overall Cognitive Status: Within Functional Limits for tasks assessed                                          Exercises  Total Joint Exercises Ankle Circles/Pumps: AROM, Strengthening, Both, 10 reps Quad Sets: Strengthening, Both, 10 reps Gluteal Sets: Strengthening, Both, 10 reps Other Exercises Other Exercises: HEP education for BLE APs, QS, and GS x 10 each every 1-2 hours daily    General Comments        Pertinent Vitals/Pain Pain Assessment Pain Assessment: No/denies pain    Home Living                          Prior Function            PT Goals (current goals can now be found in the care plan section) Progress towards PT goals: Progressing toward goals    Frequency    Min 2X/week      PT Plan Current plan remains appropriate    Co-evaluation              AM-PAC PT "6 Clicks" Mobility   Outcome Measure  Help needed turning from your back to your side while in a flat bed without using bedrails?: A Little Help needed moving from lying on your back to sitting on the side of a flat bed without using bedrails?: A Little Help needed moving to and from a bed to a chair (including a wheelchair)?: A Lot Help needed standing up from a chair using your arms (e.g., wheelchair or bedside chair)?: A Lot Help needed to walk in hospital room?: A Lot Help needed climbing 3-5 steps with a railing? : Total 6 Click Score: 13    End of Session Equipment Utilized During Treatment: Gait belt;Oxygen Activity Tolerance: Patient tolerated treatment well Patient left: in chair;with call bell/phone within reach Nurse Communication: Mobility status;Other (comment) (No chair alarm in pt's chair) PT Visit Diagnosis: Other abnormalities of gait and mobility (R26.89);Muscle weakness (generalized) (M62.81)     Time: 2919-1660 PT Time Calculation (min) (ACUTE ONLY): 25 min  Charges:  $Gait Training: 8-22 mins $Therapeutic Exercise: 8-22 mins                     D. Scott Aryannah Mohon PT, DPT 08/01/22, 5:17 PM

## 2022-08-01 NOTE — TOC Progression Note (Addendum)
Transition of Care [facility]) - Progression Note    Patient Details  Name: Timothy [name] MRN: [id number] Date of Birth: Jan 29, 1929  [facility] CM/SW Contact  Laurena Slimmer, RN Phone Number: 08/01/2022, 1:34 PM  Clinical Narrative:    Damaris Schooner with Seth Bake from [facility] Patient has been offered a bed. MD notified.         Expected Discharge Plan and Services                                               Social Determinants of Health (SDOH) Interventions SDOH Screenings   Food Insecurity: No Food Insecurity (07/30/2022)  Housing: Low Risk  (07/30/2022)  Transportation Needs: No Transportation Needs (07/30/2022)  Utilities: Not At Risk (07/30/2022)  Tobacco Use: Low Risk  (07/28/2022)    Readmission Risk Interventions     No data to display

## 2022-08-01 NOTE — Assessment & Plan Note (Addendum)
Na nadir at 132 improved with reducing Lasix.  Monitor BMP

## 2022-08-01 NOTE — Progress Notes (Addendum)
Progress Note   Patient: Timothy [name]:[id number] DOB: 11/18/1928 DOA: 07/28/2022     4 DOS: the patient was seen and examined on 08/01/2022   Brief hospital course: 89 y.o. male with medical history significant for hypertension, diabetes mellitus type 2 on oral agents, paroxysmal A-fib on Eliquis, chronic diastolic dysfunction CHF, peripheral arterial disease, Parkinson's disease, severe aortic stenosis (aortic valve area by VTI measures 0.87 cm2.  Aortic valve mean gradient measures 40.0 mmHg) seen and evaluated by cardiac surgery and deemed not a surgical candidate due to advanced Parkinson's disease, advanced age and decreased functional status who presents to the ER via EMS for evaluation of chest pain and worsening shortness of breath. Patient's daughter notes that he has had worsening shortness of breath over several days and this was noted by his caregiver who observed that with minimal exertion patient was out of breath and needed time to get back to his baseline.  He also complains of midsternal chest pain with radiation to his left arm that he has had intermittently for 3 days.  Denies having any nausea, no vomiting, no diaphoresis or palpitations and chest pain improved after he received aspirin by EMS. Notes that shortness of breath occurs with both rest and exertion and he is unable to lay flat in bed.  He does have some mild lower extremity swelling. Daughter notes that he has episodes of syncope/near syncope mostly when having bowel movements and this happens regardless of whether he has constipation or diarrhea.  Patient denies straining to have bowel movements. He is mostly wheelchair-bound and will sometimes use a rolling walker to get around his apartment He denies having any cough, no fever, no chills, no abdominal pain, no dizziness, no lightheadedness, no urinary symptoms, no blurred vision no focal deficit. Abnormal labs include uptrending troponin 49 >> 60, BNP 904 Chest  x-ray reviewed by me shows findings suggestive of mild CHF Twelve-lead EKG reviewed by me shows sinus rhythm with first-degree AV block.  Left axis deviation and a right bundle branch block. Patient received a dose of Lasix 40 mg IV and will be admitted to the hospital for further evaluation.  Assessment and Plan: * Acute diastolic CHF (congestive heart failure) (HCC) With history of severe aortic stenosis.  Mortality is high with in the next year.  Treated with IV Lasix, transitioned to PO Lasix 40 mg BID.  Appears he was not on diuretics PTA.  Sodium trending down, might be getting hypovolemic. --Reduce Lasix to 40 mg daily & consider 20 mg daily with extra 20 mg PRN dose. --Continue Toprol.   --Echo completed, results below  Symptomatic severe aortic stenosis with normal ejection fraction Not candidate for invasive procedure (TAVR or SAVR)  Cardiology following.   Palliative care consulted given poor prognosis and high risk of mortality  Chest pain Resolved. Pt had Right-sided chest pain.  Could be shingles without a rash.  Continue to monitor if a rash develops.  X-ray showing old fractures of the seventh and eighth lateral ribs.  Unlikely pulmonary embolism with being on Eliquis. Follow pending SPEP  Hypokalemia K being replaced. Due to diuresis. Monitor BMP  Hyponatremia Na 132 this AM, has trended down past couple of days since change to PO diuretic.  Also suspect poor PO intake. --Request daily weights be obtained to assess volume status --Discussed with cardiology and will reduce diuretic dose as outlined --Monitor for now  Acute pulmonary edema ([place]) .  Myocardial injury Elevated troponin likely demand ischemia  from heart failure.  AKI (acute kidney injury) ([place]) Renal function stable with Cr around 1.3, was 1.35 on presentation down to 1.29.  Previous creatinine a few months ago 1.01. Mild AKI due to diuresis which has been de-escalated. Repeat BMP in 1-2 weeks,  continue to monitor here.  [facility] PT and OT recommend SNF. TOC following for placement.  Parkinsonian features Stable. PT/OT recommend SNF  Type 2 diabetes mellitus with hyperlipidemia (HCC) Last hemoglobin A1c 7.7.   On sliding scale.   Resume oral hypoglycemics (Farxiga and Tradjent sub for home Jardiance)   Parkinson disease Continue Sinemet  Paroxysmal atrial fibrillation (HCC) Continue Toprol for rate control and Eliquis for anticoagulation.  Flecainide discontinued by cardiology with heart failure.        Subjective: Patient awake resting in bed this AM.  Reports he slept well overnight.  Denies acute complaints and no acute events reported.   Physical Exam: Vitals:   08/01/22 0452 08/01/22 0848 08/01/22 1240 08/01/22 1630  BP: 104/61 108/63 113/62 (!) 113/55  Pulse: 80 78 73 81  Resp: '18 20 20 18  '$ Temp: 97.6 F (36.4 C) 98 F (36.7 C) (!) 97.5 F (36.4 C) 97.8 F (36.6 C)  TempSrc: Oral   Oral  SpO2: 100% 99% 100% 97%  Weight:    79.3 kg  Height:       General exam: awake resting in bed, no acute distress HEENT: moist mucus membranes, hearing grossly normal  Respiratory system: on room air, normal respiratory effort, CTAB. Cardiovascular system: normal V4/U9, RRR, 2/6 systolic murmur, no pedal edema.   Gastrointestinal system: soft, NT, ND Central nervous system:. no gross focal neurologic deficits, normal speech Extremities: moves all, no edema, normal tone Skin: dry, intact, normal temperature Psychiatry: normal mood, congruent affect    Data Reviewed: Notable labs: K 3.3 >> 3.0, Na 134>>132, glucose 170, Cr 1.33>> 1.29 overall stable,  CBG's 151 >> 143 >> 193 >> 305 lunch time today     Echocardiogram 07/29/2022: LVEF 60 to 65%, moderate LVH, indeterminate diastolic parameters, moderate MR, mild AR, severe AS    Family Communication: Spoke with daughter at the bedside 1/25, and by phone this afternoon.  Disposition: Status is:  Inpatient Remains inpatient appropriate because: Needs SNF placement. Medically stable.   Planned Discharge Destination: SNF     Time spent: 35 minutes  Author: Ezekiel Slocumb, DO 08/01/2022 5:23 PM  For on call review [email or web address].

## 2022-08-02 DIAGNOSIS — I5031 Acute diastolic (congestive) heart failure: Secondary | ICD-10-CM | POA: Diagnosis not present

## 2022-08-02 LAB — GLUCOSE, CAPILLARY
Glucose-Capillary: 184 mg/dL — ABNORMAL HIGH (ref 70–99)
Glucose-Capillary: 240 mg/dL — ABNORMAL HIGH (ref 70–99)
Glucose-Capillary: 290 mg/dL — ABNORMAL HIGH (ref 70–99)
Glucose-Capillary: 324 mg/dL — ABNORMAL HIGH (ref 70–99)

## 2022-08-02 LAB — BASIC METABOLIC PANEL
Anion gap: 11 (ref 5–15)
BUN: 20 mg/dL (ref 8–23)
CO2: 24 mmol/L (ref 22–32)
Calcium: 8.5 mg/dL — ABNORMAL LOW (ref 8.9–10.3)
Chloride: 99 mmol/L (ref 98–111)
Creatinine, Ser: 1.22 mg/dL (ref 0.61–1.24)
GFR, Estimated: 56 mL/min — ABNORMAL LOW (ref >60)
Glucose, Bld: 148 mg/dL — ABNORMAL HIGH (ref 70–99)
Potassium: 3.7 mmol/L (ref 3.5–5.1)
Sodium: 134 mmol/L — ABNORMAL LOW (ref 135–145)

## 2022-08-02 MED ORDER — INSULIN ASPART 100 UNIT/ML IJ SOLN
2.0000 [IU] | Freq: Once | INTRAMUSCULAR | Status: AC
  Administered 2022-08-02: 2 [IU] via SUBCUTANEOUS
  Filled 2022-08-02: qty 1

## 2022-08-02 NOTE — TOC Progression Note (Signed)
Transition of Care [facility]) - Progression Note    Patient Details  Name: Timothy [name] MRN: [id number] Date of Birth: September 08, 1928  Transition of Care [facility] CM/SW Contact  Izola Price, RN Phone Number: 08/02/2022, 3:49 PM  Clinical Narrative:  1/27: [name] patient on Monday 08/04/22 per [facility]. Updated provider. Simmie Davies RN CM         Expected Discharge Plan and Services                                               Social Determinants of Health (SDOH) Interventions SDOH Screenings   Food Insecurity: No Food Insecurity (07/30/2022)  Housing: Low Risk  (07/30/2022)  Transportation Needs: No Transportation Needs (07/30/2022)  Utilities: Not At Risk (07/30/2022)  Tobacco Use: Low Risk  (07/28/2022)    Readmission Risk Interventions     No data to display

## 2022-08-02 NOTE — Progress Notes (Signed)
Mobility Specialist - Progress Note  Pre-mobility: HR(84), SpO2(100) Post-mobility: HR(80),SPO2(99)    08/02/22 1000  Mobility  Activity Ambulated with assistance in room;Stood at bedside;Transferred from bed to chair;Repositioned in chair;Dangled on edge of bed  Level of Assistance Minimal assist, patient does 75% or more  Assistive Device Front wheel walker  Distance Ambulated (ft) 3 ft  Activity Response Tolerated well  Mobility Referral Yes  $Mobility charge 1 Mobility   Pt resting in bed on RA upon entry. Pt agreeable to ambulation. Pt performed bed mobility ModI and only needed assistance getting EOB. Pt STS and ambulates in room MinA to RW. Pt ambulated ( 4 steps turn pivot and 4 steps backward) to recliner and left with needs in reach and bed alarm activated. Pt daughter present in room.   Loma Sender Mobility Specialist 08/02/22, 10:13 AM

## 2022-08-02 NOTE — Progress Notes (Signed)
Progress Note   Patient: Timothy [name] ZDG:[id number] DOB: 01-21-1929 DOA: 07/28/2022     5 DOS: the patient was seen and examined on 08/02/2022   Brief hospital course: 89 y.o. male with medical history significant for hypertension, diabetes mellitus type 2 on oral agents, paroxysmal A-fib on Eliquis, chronic diastolic dysfunction CHF, peripheral arterial disease, Parkinson's disease, severe aortic stenosis (aortic valve area by VTI measures 0.87 cm2.  Aortic valve mean gradient measures 40.0 mmHg) seen and evaluated by cardiac surgery and deemed not a surgical candidate due to advanced Parkinson's disease, advanced age and decreased functional status who presents to the ER via EMS for evaluation of chest pain and worsening shortness of breath. Patient's daughter notes that he has had worsening shortness of breath over several days and this was noted by his caregiver who observed that with minimal exertion patient was out of breath and needed time to get back to his baseline.  He also complains of midsternal chest pain with radiation to his left arm that he has had intermittently for 3 days.  Denies having any nausea, no vomiting, no diaphoresis or palpitations and chest pain improved after he received aspirin by EMS. Notes that shortness of breath occurs with both rest and exertion and he is unable to lay flat in bed.  He does have some mild lower extremity swelling. Daughter notes that he has episodes of syncope/near syncope mostly when having bowel movements and this happens regardless of whether he has constipation or diarrhea.  Patient denies straining to have bowel movements. He is mostly wheelchair-bound and will sometimes use a rolling walker to get around his apartment He denies having any cough, no fever, no chills, no abdominal pain, no dizziness, no lightheadedness, no urinary symptoms, no blurred vision no focal deficit. Abnormal labs include uptrending troponin 49 >> 60, BNP 904 Chest  x-ray reviewed by me shows findings suggestive of mild CHF Twelve-lead EKG reviewed by me shows sinus rhythm with first-degree AV block.  Left axis deviation and a right bundle branch block. Patient received a dose of Lasix 40 mg IV and will be admitted to the hospital for further evaluation.  Assessment and Plan: * Acute diastolic CHF (congestive heart failure) (HCC) With history of severe aortic stenosis.  Mortality is high with in the next year.  Treated with IV Lasix, transitioned to PO Lasix 40 mg BID.  Appears he was not on diuretics PTA.  Sodium trending down, might be getting hypovolemic. --Reduce Lasix to 40 mg daily & consider 20 mg daily with extra 20 mg PRN dose. --Continue Toprol.   --Echo completed, results below  Symptomatic severe aortic stenosis with normal ejection fraction Not candidate for invasive procedure (TAVR or SAVR)  Cardiology following.   Palliative care consulted given poor prognosis and high risk of mortality  Chest pain Resolved. Pt had Right-sided chest pain.  Could be shingles without a rash.  Continue to monitor if a rash develops.  X-ray showing old fractures of the seventh and eighth lateral ribs.  Unlikely pulmonary embolism with being on Eliquis. Follow pending SPEP  Hypokalemia K being replaced. Due to diuresis. Monitor BMP  Hyponatremia Na 132 this AM, has trended down past couple of days since change to PO diuretic.  Also suspect poor PO intake. --Request daily weights be obtained to assess volume status --Discussed with cardiology and will reduce diuretic dose as outlined --Monitor for now  Acute pulmonary edema ([place]) .  Myocardial injury Elevated troponin likely demand ischemia  from heart failure.  AKI (acute kidney injury) ([place]) Renal function stable with Cr around 1.3, was 1.35 on presentation down to 1.29.  Previous creatinine a few months ago 1.01. Mild AKI due to diuresis which has been de-escalated. Repeat BMP in 1-2 weeks,  continue to monitor here.  [facility] PT and OT recommend SNF. TOC following for placement.  Parkinsonian features Stable. PT/OT recommend SNF  Type 2 diabetes mellitus with hyperlipidemia (HCC) Last hemoglobin A1c 7.7.   On sliding scale.   Resume oral hypoglycemics (Farxiga and Tradjent sub for home Jardiance)   Parkinson disease Continue Sinemet  Paroxysmal atrial fibrillation (HCC) Continue Toprol for rate control and Eliquis for anticoagulation.  Flecainide discontinued by cardiology with heart failure.        Subjective: Patient awake resting in bed this AM.  Daughter at bedside.  Therapy about to work with him and help him up to chair.  He reports sleeping fine.  He denies any acute complaints.    Physical Exam: Vitals:   08/02/22 0319 08/02/22 0758 08/02/22 0847 08/02/22 1146  BP: (!) 105/52 122/62  103/61  Pulse: 65 71  79  Resp: '20 18  18  '$ Temp: 98.6 F (37 C)   97.8 F (36.6 C)  TempSrc:    Oral  SpO2: 99% 99% 94% 100%  Weight:      Height:       General exam: awake resting in bed, no acute distress HEENT: moist mucus membranes, hearing grossly normal  Respiratory system: on room air, normal respiratory effort, CTAB. Cardiovascular system: normal E3/X5, RRR, 2/6 systolic murmur, no pedal edema.   Gastrointestinal system: soft, NT, ND Central nervous system:. no gross focal neurologic deficits, normal speech Extremities: moves all, no edema, normal tone Skin: dry, intact, normal temperature Psychiatry: normal mood, congruent affect    Data Reviewed: Notable labs:  Na 134>>132>>134 (with backing off Lasix), glucose 148, Cr 1.33>> 1.29>>1.22,   CBG's 312 >> 242 >> 184 >> 240     Echocardiogram 07/29/2022: LVEF 60 to 65%, moderate LVH, indeterminate diastolic parameters, moderate MR, mild AR, severe AS    Family Communication: Spoke with daughter at the bedside today  Disposition: Status is: Inpatient Remains inpatient appropriate because:   Needs SNF placement.  Accepted to [facility]. Waiting to hear if they can accept this weekend.  Medically stable.   Planned Discharge Destination: SNF     Time spent: 35 minutes  Author: Ezekiel Slocumb, DO 08/02/2022 3:36 PM  For on call review [email or web address].

## 2022-08-02 NOTE — TOC Progression Note (Signed)
Transition of Care [facility]) - Progression Note    Patient Details  Name: Timothy [name] MRN: [id number] Date of Birth: [birth year]  Transition of Care [facility]) CM/SW Contact  Izola Price, RN Phone Number: 08/02/2022, 2:19 PM  Clinical Narrative: 1/17: Left Vm for and call back number by RN CM for Baltazar Najjar. at [facility] as patient ready for discharge and prior CM note indicated a bed was offered by [facility]. Simmie Davies RN CM           Expected Discharge Plan and Services                                               Social Determinants of Health (SDOH) Interventions SDOH Screenings   Food Insecurity: No Food Insecurity (07/30/2022)  Housing: Low Risk  (07/30/2022)  Transportation Needs: No Transportation Needs (07/30/2022)  Utilities: Not At Risk (07/30/2022)  Tobacco Use: Low Risk  (07/28/2022)    Readmission Risk Interventions     No data to display

## 2022-08-03 DIAGNOSIS — I5031 Acute diastolic (congestive) heart failure: Secondary | ICD-10-CM | POA: Diagnosis not present

## 2022-08-03 LAB — BASIC METABOLIC PANEL
Anion gap: 6 (ref 5–15)
BUN: 23 mg/dL (ref 8–23)
CO2: 25 mmol/L (ref 22–32)
Calcium: 8.5 mg/dL — ABNORMAL LOW (ref 8.9–10.3)
Chloride: 102 mmol/L (ref 98–111)
Creatinine, Ser: 1.34 mg/dL — ABNORMAL HIGH (ref 0.61–1.24)
GFR, Estimated: 50 mL/min — ABNORMAL LOW (ref >60)
Glucose, Bld: 169 mg/dL — ABNORMAL HIGH (ref 70–99)
Potassium: 4 mmol/L (ref 3.5–5.1)
Sodium: 133 mmol/L — ABNORMAL LOW (ref 135–145)

## 2022-08-03 LAB — GLUCOSE, CAPILLARY
Glucose-Capillary: 169 mg/dL — ABNORMAL HIGH (ref 70–99)
Glucose-Capillary: 273 mg/dL — ABNORMAL HIGH (ref 70–99)
Glucose-Capillary: 254 mg/dL — ABNORMAL HIGH (ref 70–99)
Glucose-Capillary: 218 mg/dL — ABNORMAL HIGH (ref 70–99)

## 2022-08-03 NOTE — Progress Notes (Signed)
Mobility Specialist - Progress Note    08/03/22 1113  Mobility  Activity Ambulated with assistance in room;Transferred from bed to chair;Repositioned in chair;Dangled on edge of bed;Stood at bedside  Level of Assistance Minimal assist, patient does 75% or more  Assistive Device Front wheel walker  Distance Ambulated (ft) 4 ft  Activity Response Tolerated well  Mobility Referral Yes  $Mobility charge 1 Mobility   Pt resting in bed on RA upon entry. Pt performed bed mobility and ambulation MinA. Pt STS and ambulated to recliner with RW MinA. Pt gait is unsteady and pt has a hard time separating feet to take steps. Pt was able to take small steps forward and backward to recliner. Pt left in chair with needs in reach and chair alarm activated. Pt daughter present in room.   Loma Sender Mobility Specialist 08/03/22, 11:22 AM

## 2022-08-03 NOTE — Progress Notes (Signed)
Progress Note   Patient: Timothy [name]:[id number] DOB: Jul 18, 1928 DOA: 07/28/2022     6 DOS: the patient was seen and examined on 08/03/2022   Brief hospital course: 89 y.o. male with medical history significant for hypertension, diabetes mellitus type 2 on oral agents, paroxysmal A-fib on Eliquis, chronic diastolic dysfunction CHF, peripheral arterial disease, Parkinson's disease, severe aortic stenosis (aortic valve area by VTI measures 0.87 cm2.  Aortic valve mean gradient measures 40.0 mmHg) seen and evaluated by cardiac surgery and deemed not a surgical candidate due to advanced Parkinson's disease, advanced age and decreased functional status who presents to the ER via EMS for evaluation of chest pain and worsening shortness of breath. Patient's daughter notes that he has had worsening shortness of breath over several days and this was noted by his caregiver who observed that with minimal exertion patient was out of breath and needed time to get back to his baseline.  He also complains of midsternal chest pain with radiation to his left arm that he has had intermittently for 3 days.  Denies having any nausea, no vomiting, no diaphoresis or palpitations and chest pain improved after he received aspirin by EMS. Notes that shortness of breath occurs with both rest and exertion and he is unable to lay flat in bed.  He does have some mild lower extremity swelling. Daughter notes that he has episodes of syncope/near syncope mostly when having bowel movements and this happens regardless of whether he has constipation or diarrhea.  Patient denies straining to have bowel movements. He is mostly wheelchair-bound and will sometimes use a rolling walker to get around his apartment He denies having any cough, no fever, no chills, no abdominal pain, no dizziness, no lightheadedness, no urinary symptoms, no blurred vision no focal deficit. Abnormal labs include uptrending troponin 49 >> 60, BNP 904 Chest  x-ray reviewed by me shows findings suggestive of mild CHF Twelve-lead EKG reviewed by me shows sinus rhythm with first-degree AV block.  Left axis deviation and a right bundle branch block. Patient received a dose of Lasix 40 mg IV and will be admitted to the hospital for further evaluation.  Assessment and Plan: * Acute diastolic CHF (congestive heart failure) (HCC) With history of severe aortic stenosis.  Mortality is high with in the next year.  Treated with IV Lasix, transitioned to PO Lasix 40 mg BID.  Appears he was not on diuretics PTA.  Sodium trending down, might be getting hypovolemic. --Reduce Lasix to 40 mg daily & consider 20 mg daily with extra 20 mg PRN dose. --Continue Toprol.   --Echo completed, results below  Symptomatic severe aortic stenosis with normal ejection fraction Not candidate for invasive procedure (TAVR or SAVR)  Cardiology following.   Palliative care consulted given poor prognosis and high risk of mortality  Chest pain Resolved. Pt had Right-sided chest pain.  Could be shingles without a rash.  Continue to monitor if a rash develops.  X-ray showing old fractures of the seventh and eighth lateral ribs.  Unlikely pulmonary embolism with being on Eliquis. Follow pending SPEP  Hypokalemia Due to diuresis. K has been replaced. K today 4.0 (1/28) Monitor BMP  Hyponatremia Na nadir at 132 improved with reducing Lasix.  Monitor BMP   Acute pulmonary edema ([place]) .  Myocardial injury Elevated troponin likely demand ischemia from heart failure.  AKI (acute kidney injury) ([place]) Renal function stable with Cr around 1.3, was 1.35 on presentation down to 1.29.  Previous creatinine a  few months ago 1.01. Mild AKI due to diuresis which has been de-escalated. Repeat BMP in 1-2 weeks, continue to monitor here.  [facility] PT and OT recommend SNF. TOC following for placement.  Parkinsonian features Stable. PT/OT recommend SNF  Type 2 diabetes mellitus with  hyperlipidemia (HCC) Last hemoglobin A1c 7.7.   On sliding scale.   Resume oral hypoglycemics (Farxiga and Tradjent sub for home Jardiance)   Parkinson disease Continue Sinemet  Paroxysmal atrial fibrillation (HCC) Continue Toprol for rate control and Eliquis for anticoagulation.  Flecainide discontinued by cardiology with heart failure.        Subjective: Patient awake resting in bed this AM.  Daughter at bedside.  Pt reports not sleeping last night, was up all night.  Daughter reports increased confusion later yesterday and this AM. He is more fidgety and pulling at wires etc.     Physical Exam: Vitals:   08/02/22 2334 08/03/22 0442 08/03/22 0809 08/03/22 1159  BP: (!) 105/54 121/63 (!) 115/59 105/62  Pulse: 68 71 76 74  Resp: '18 16 16 16  '$ Temp: 97.7 F (36.5 C) 98.1 F (36.7 C) (!) 97.5 F (36.4 C) 98 F (36.7 C)  TempSrc: Oral   Oral  SpO2: 100% 97% 99% 98%  Weight:      Height:       General exam: awake resting in bed, no acute distress HEENT: moist mucus membranes, hearing grossly normal  Respiratory system: on room air, normal respiratory effort, CTAB. Cardiovascular system: normal W3/U8, RRR, 2/6 systolic murmur at RUSB radiating to apex, no pedal edema.   Gastrointestinal system: soft, NT, ND Central nervous system:. no gross focal neurologic deficits, normal speech Extremities: moves all, no edema, normal tone Skin: dry, intact, normal temperature Psychiatry: normal mood, congruent affect    Data Reviewed: Notable labs:  Na 133 from 134, glucose 169, Cr 1.34 from 1.22    Echocardiogram 07/29/2022: LVEF 60 to 65%, moderate LVH, indeterminate diastolic parameters, moderate MR, mild AR, severe AS    Family Communication: Spoke with daughter at the bedside today  Disposition: Status is: Inpatient Remains inpatient appropriate because:  Needs SNF placement.  Accepted to [facility]. Waiting to hear if they can accept this weekend.  Medically stable.    Planned Discharge Destination: SNF     Time spent: 35 minutes  Author: Ezekiel Slocumb, DO 08/03/2022 3:54 PM  For on call review [email or web address].

## 2022-08-04 ENCOUNTER — Telehealth: Payer: Self-pay | Admitting: Student

## 2022-08-04 ENCOUNTER — Encounter: Payer: Self-pay | Admitting: Internal Medicine

## 2022-08-04 DIAGNOSIS — I5031 Acute diastolic (congestive) heart failure: Secondary | ICD-10-CM | POA: Diagnosis not present

## 2022-08-04 LAB — BASIC METABOLIC PANEL
Anion gap: 13 (ref 5–15)
BUN: 22 mg/dL (ref 8–23)
CO2: 23 mmol/L (ref 22–32)
Calcium: 8.5 mg/dL — ABNORMAL LOW (ref 8.9–10.3)
Chloride: 101 mmol/L (ref 98–111)
Creatinine, Ser: 1.37 mg/dL — ABNORMAL HIGH (ref 0.61–1.24)
GFR, Estimated: 49 mL/min — ABNORMAL LOW (ref >60)
Glucose, Bld: 167 mg/dL — ABNORMAL HIGH (ref 70–99)
Potassium: 4.1 mmol/L (ref 3.5–5.1)
Sodium: 137 mmol/L (ref 135–145)

## 2022-08-04 LAB — CBC
HCT: 32.8 % — ABNORMAL LOW (ref 39.0–52.0)
Hemoglobin: 10.3 g/dL — ABNORMAL LOW (ref 13.0–17.0)
MCH: 29.3 pg (ref 26.0–34.0)
MCHC: 31.4 g/dL (ref 30.0–36.0)
MCV: 93.2 fL (ref 80.0–100.0)
Platelets: 235 10*3/uL (ref 150–400)
RBC: 3.52 MIL/uL — ABNORMAL LOW (ref 4.22–5.81)
RDW: 16 % — ABNORMAL HIGH (ref 11.5–15.5)
WBC: 5.8 10*3/uL (ref 4.0–10.5)
nRBC: 0 % (ref 0.0–0.2)

## 2022-08-04 LAB — GLUCOSE, CAPILLARY: Glucose-Capillary: 181 mg/dL — ABNORMAL HIGH (ref 70–99)

## 2022-08-04 LAB — MAGNESIUM: Magnesium: 2.4 mg/dL (ref 1.7–2.4)

## 2022-08-04 MED ORDER — FUROSEMIDE 20 MG PO TABS: 20.0000 mg | ORAL_TABLET | Freq: Every day | ORAL | Status: DC

## 2022-08-04 MED ORDER — METOPROLOL SUCCINATE ER 25 MG PO TB24: 12.5000 mg | ORAL_TABLET | Freq: Every day | ORAL | Status: DC

## 2022-08-04 MED ORDER — DICLOFENAC SODIUM 1 % EX GEL: 4.0000 g | Freq: Four times a day (QID) | CUTANEOUS | Status: DC | PRN

## 2022-08-04 MED ORDER — LIDOCAINE 5 % EX PTCH
1.0000 | MEDICATED_PATCH | CUTANEOUS | Status: DC
  Administered 2022-08-04: 1 via TRANSDERMAL
  Filled 2022-08-04: qty 1

## 2022-08-04 MED ORDER — LIDOCAINE 5 % EX PTCH: 1.0000 | MEDICATED_PATCH | CUTANEOUS | 0 refills | Status: DC

## 2022-08-04 NOTE — Progress Notes (Signed)
Inpatient Diabetes Program Recommendations  AACE/ADA: New Consensus Statement on Inpatient Glycemic Control (2015)  Target Ranges:  Prepandial:   less than 140 mg/dL      Peak postprandial:   less than 180 mg/dL (1-2 hours)      Critically ill patients:  140 - 180 mg/dL   Lab Results  Component Value Date   GLUCAP 181 (H) 08/04/2022   HGBA1C 7.7 (H) 07/28/2022    Review of Glycemic Control  Latest Reference Range & Units 08/03/22 11:56 08/03/22 16:10 08/03/22 22:27 08/04/22 09:02  Glucose-Capillary 70 - 99 mg/dL 273 (H) 254 (H) 218 (H) 181 (H)  (H): Data is abnormally high Diabetes history: DM2 Outpatient Diabetes medications: Farxiga 10, Glip/Met 5/500 bid, Januvia 100 qd  Current orders for Inpatient glycemic control:  Farxiga 10 mg daily Tradjenta 5 mg daily   Inpatient Diabetes Program Recommendations:    Note oral agents resumed. If appropriate, may consider restarting sensitive (0-9 units) Novolog correction tid with meals.   Thanks,  Adah Perl, RN, BC-ADM Inpatient Diabetes Coordinator Pager [phone]  (8a-5p)

## 2022-08-04 NOTE — Telephone Encounter (Signed)
Received call from SNF regarding patinet's medications. Reviewed and approved.

## 2022-08-04 NOTE — Progress Notes (Signed)
Physical Therapy Treatment Patient Details Name: Timothy [name] MRN: [id number] DOB: 1929-03-14 Today's Date: 08/04/2022   History of Present Illness Pt is a 89 y.o. male presenting to hospital 07/28/22 with c/o chest pain and SOB.  Pt admitted with acute diastolic CHF, symptomatic severe aortic stenosis, and chest pain at rest (midsternal with radiation to L shoulder).  PMH includes htn, HLD, DM, severe aortic stenosis, a-fib on Eliquis, Parkinson's disease, AAA, Paget's disease, PAD, TIA, back surgery.    PT Comments    Pt was pleasant and motivated to participate during the session and put forth good effort throughout. Pt required extensive assistance and cuing with sit to/from stand transfer training and once in standing was only able to amb around 3-5 feet during each walk with slow, effortful, shuffling steps and min A for stability.  Pt given frequent therapeutic rest breaks with no adverse symptoms noted by pt and with SpO2 and HR WNL on room air.  Pt will benefit from PT services in a SNF setting upon discharge to safely address deficits listed in patient problem list for decreased caregiver assistance and eventual return to PLOF.     Recommendations for follow up therapy are one component of a multi-disciplinary discharge planning process, led by the attending physician.  Recommendations may be updated based on patient status, additional functional criteria and insurance authorization.  Follow Up Recommendations  Skilled nursing-short term rehab (<3 hours/day) Can patient physically be transported by private vehicle: No   Assistance Recommended at Discharge Frequent or constant Supervision/Assistance  Patient can return home with the following Assistance with cooking/housework;Assist for transportation;Help with stairs or ramp for entrance;A lot of help with walking and/or transfers;A lot of help with bathing/dressing/bathroom   Equipment Recommendations  None recommended by PT     Recommendations for Other Services       Precautions / Restrictions Precautions Precautions: Fall Precaution Comments: h/o Parkinson's disease Restrictions Weight Bearing Restrictions: No     Mobility  Bed Mobility               General bed mobility comments: NT, pt in recliner    Transfers Overall transfer level: Needs assistance Equipment used: Rolling walker (2 wheels) Transfers: Sit to/from Stand Sit to Stand: Mod assist           General transfer comment: Mod A and mod verbal cues for hand placement and increased trunk flexion    Ambulation/Gait Ambulation/Gait assistance: Min assist Gait Distance (Feet): 5 Feet Assistive device: Rolling walker (2 wheels) Gait Pattern/deviations: Decreased step length - right, Decreased step length - left, Trunk flexed, Shuffle, Step-to pattern Gait velocity: decreased     General Gait Details: Pt able to amb 3 x 3-5 feet with very slow, shuffling steps and heavy trunk flexion with min A for stability and to guide the RW   Stairs             Wheelchair Mobility    Modified Rankin (Stroke Patients Only)       Balance Overall balance assessment: Needs assistance Sitting-balance support: Feet supported Sitting balance-Leahy Scale: Good     Standing balance support: Bilateral upper extremity supported, During functional activity, Reliant on assistive device for balance Standing balance-Leahy Scale: Poor                              Cognition Arousal/Alertness: Awake/alert Behavior During Therapy: WFL for tasks assessed/performed Overall Cognitive Status: Within Functional  Limits for tasks assessed                                          Exercises Total Joint Exercises Ankle Circles/Pumps: AROM, Strengthening, Both, 10 reps Towel Squeeze: Strengthening, Both, 10 reps Long Arc Quad: Strengthening, Both, 10 reps Knee Flexion: Strengthening, Both, 10 reps Marching in  Standing: Strengthening, Both, 5 reps, Standing    General Comments        Pertinent Vitals/Pain Pain Assessment Pain Assessment: No/denies pain    Home Living                          Prior Function            PT Goals (current goals can now be found in the care plan section) Progress towards PT goals: Progressing toward goals    Frequency    Min 2X/week      PT Plan Current plan remains appropriate    Co-evaluation              AM-PAC PT "6 Clicks" Mobility   Outcome Measure  Help needed turning from your back to your side while in a flat bed without using bedrails?: A Little Help needed moving from lying on your back to sitting on the side of a flat bed without using bedrails?: A Little Help needed moving to and from a bed to a chair (including a wheelchair)?: A Lot Help needed standing up from a chair using your arms (e.g., wheelchair or bedside chair)?: A Lot Help needed to walk in hospital room?: A Lot Help needed climbing 3-5 steps with a railing? : Total 6 Click Score: 13    End of Session Equipment Utilized During Treatment: Gait belt Activity Tolerance: Patient tolerated treatment well Patient left: in chair;with call bell/phone within reach;with nursing/sitter in room Nurse Communication: Mobility status PT Visit Diagnosis: Other abnormalities of gait and mobility (R26.89);Muscle weakness (generalized) (M62.81)     Time: 6389-3734 PT Time Calculation (min) (ACUTE ONLY): 25 min  Charges:  $Gait Training: 8-22 mins $Therapeutic Exercise: 8-22 mins                    D. Scott Donata Reddick PT, DPT 08/04/22, 12:03 PM

## 2022-08-04 NOTE — Discharge Summary (Signed)
Physician Discharge Summary   Patient: Timothy [name] MRN: [id number] DOB: 08-Mar-1929  Admit date:     07/28/2022  Discharge date: 08/04/22  Discharge Physician: Ezekiel Slocumb   PCP: [facility]   Recommendations at discharge:    Follow up with Cardiology Follow up with Primary Care Repeat BMP, Mg, CBC in 1 week Adjust Lasix dose as needed based on renal function and volume status.   Continue to follow daily weights   Discharge Diagnoses: Principal Problem:   Acute diastolic CHF (congestive heart failure) (HCC) Active Problems:   Symptomatic severe aortic stenosis with normal ejection fraction   Right-sided chest pain   Paroxysmal atrial fibrillation (HCC)   Parkinson disease   Type 2 diabetes mellitus with hyperlipidemia (HCC)   Parkinsonian features   Debility   AKI (acute kidney injury) ([place])   Myocardial injury   Acute pulmonary edema (HCC)   Hyponatremia  Resolved Problems:   Hypokalemia  Hospital Course: 89 y.o. male with medical history significant for hypertension, diabetes mellitus type 2 on oral agents, paroxysmal A-fib on Eliquis, chronic diastolic dysfunction CHF, peripheral arterial disease, Parkinson's disease, severe aortic stenosis (aortic valve area by VTI measures 0.87 cm2.  Aortic valve mean gradient measures 40.0 mmHg) seen and evaluated by cardiac surgery and deemed not a surgical candidate due to advanced Parkinson's disease, advanced age and decreased functional status who presents to the ER via EMS for evaluation of chest pain and worsening shortness of breath. Patient's daughter notes that he has had worsening shortness of breath over several days and this was noted by his caregiver who observed that with minimal exertion patient was out of breath and needed time to get back to his baseline.  He also complains of midsternal chest pain with radiation to his left arm that he has had intermittently for 3 days.  Denies having any nausea, no  vomiting, no diaphoresis or palpitations and chest pain improved after he received aspirin by EMS. Notes that shortness of breath occurs with both rest and exertion and he is unable to lay flat in bed.  He does have some mild lower extremity swelling. Daughter notes that he has episodes of syncope/near syncope mostly when having bowel movements and this happens regardless of whether he has constipation or diarrhea.  Patient denies straining to have bowel movements. He is mostly wheelchair-bound and will sometimes use a rolling walker to get around his apartment He denies having any cough, no fever, no chills, no abdominal pain, no dizziness, no lightheadedness, no urinary symptoms, no blurred vision no focal deficit. Abnormal labs include uptrending troponin 49 >> 60, BNP 904 Chest x-ray reviewed by me shows findings suggestive of mild CHF Twelve-lead EKG reviewed by me shows sinus rhythm with first-degree AV block.  Left axis deviation and a right bundle branch block. Patient received a dose of Lasix 40 mg IV and will be admitted to the hospital for further evaluation.  Assessment and Plan: * Acute diastolic CHF (congestive heart failure) (HCC) With history of severe aortic stenosis.  Mortality is high with in the next year.  Treated with IV Lasix, transitioned to PO Lasix 40 mg BID.  Appears he was not on diuretics PTA.  Sodium trended down, getting hypovolemic. --Reduced Lasix to 20 mg daily  --2nd AS NEEDED Lasix 20 mg in evening if weight gain or progressive dysnpea. --Continue Toprol.   --Echo completed, results below  Symptomatic severe aortic stenosis with normal ejection fraction Not candidate for invasive  procedure (TAVR or SAVR)  Cardiology following.   Palliative care consulted given poor prognosis and high risk of mortality  Right-sided chest pain Resolved. Pt had Right-sided chest pain.  Could be shingles without a rash.  Continue to monitor if a rash develops.  X-ray showing  old fractures of the seventh and eighth lateral ribs.  Unlikely pulmonary embolism with being on Eliquis. Negative M spike on SPEP --Topical lidocaine or voltaren gell, whichever is more helpful  Hyponatremia Na nadir at 132 improved with reducing Lasix.  Monitor BMP   Acute pulmonary edema ([place]) .  Myocardial injury Elevated troponin likely demand ischemia from heart failure.  AKI (acute kidney injury) ([place]) Renal function stable with Cr around 1.3, was 1.35 on presentation down to 1.29.  Previous creatinine a few months ago 1.01. Mild AKI due to diuresis which has been de-escalated. Repeat BMP in 1-2 weeks, continue to monitor here.  Debility PT and OT recommend SNF. TOC following for placement.  Parkinsonian features Stable. PT/OT recommend SNF  Type 2 diabetes mellitus with hyperlipidemia (HCC) Last hemoglobin A1c 7.7.   On sliding scale.   Resume oral hypoglycemics (Farxiga and Tradjent sub for home Jardiance)   Parkinson disease Continue Sinemet  Paroxysmal atrial fibrillation (HCC) Continue Toprol for rate control and Eliquis for anticoagulation.  Flecainide discontinued by cardiology with heart failure.  Hypokalemia-resolved as of 08/04/2022 Due to diuresis. K has been replaced. K today 4.1 Repeat BMP in 1 week         Consultants: Cardiology  Procedures performed:  Echocardiogram 07/29/2022: LVEF 60 to 65%, moderate LVH, indeterminate diastolic parameters, moderate MR, mild AR, severe AS    Disposition: Skilled nursing facility Diet recommendation:  Discharge Diet Orders (From admission, onward)     Start     Ordered   08/04/22 0000  Diet - low sodium heart healthy        08/04/22 1132           Cardiac and Carb modified diet DISCHARGE MEDICATION: Allergies as of 08/04/2022   No Known Allergies      Medication List     STOP taking these medications    carbidopa-levodopa 25-100 MG tablet Commonly known as: SINEMET IR    flecainide 50 MG tablet Commonly known as: TAMBOCOR   traMADol 50 MG tablet Commonly known as: Ultram       TAKE these medications    acetaminophen 325 MG tablet Commonly known as: TYLENOL Take 2 tablets (650 mg total) by mouth every 6 (six) hours as needed for mild pain or headache (fever >/= 101).   apixaban 5 MG Tabs tablet Commonly known as: Eliquis Take 1 tablet (5 mg total) by mouth 2 (two) times daily.   CALCIUM + D PO Take 1 tablet by mouth daily.   CEROVITE SENIOR PO Take 1 tablet by mouth daily.   cyanocobalamin 1000 MCG/ML injection Commonly known as: VITAMIN B12 INJECT 1ML ONCE A WEEK FOR 14 DAYS (2 WEEKS)  and  THEN RETURN TO MONTHLY FREQUENCY   diclofenac Sodium 1 % Gel Commonly known as: VOLTAREN Apply 4 g topically 4 (four) times daily as needed (upper back or rib cage pain).   famotidine 20 MG tablet Commonly known as: PEPCID Take 20 mg by mouth at bedtime.   Farxiga 10 MG Tabs tablet Generic drug: dapagliflozin propanediol TAKE 1 TABLET BY MOUTH DAILY   furosemide 20 MG tablet Commonly known as: LASIX Take 1 tablet (20 mg total) by mouth daily.  Take additional 20 mg AS NEEDED with supper if progressive weight gain or worsening dyspnea Start taking on: August 05, 2022   glipiZIDE-metformin 5-500 MG tablet Commonly known as: METAGLIP TAKE 1 TABLET BY MOUTH TWICE DAILY   GLUCOSAMINE-CHONDROITIN PO Take by mouth daily.   hydrocortisone 2.5 % cream Apply 1-2 times daily to areas at neck, face, scalp as needed for itch.   Januvia 100 MG tablet Generic drug: sitaGLIPtin TAKE 1 TABLET BY MOUTH DAILY   ketoconazole 2 % cream Commonly known as: NIZORAL once daily to pink scaly patches at face, ears and scalp.   lidocaine 5 % Commonly known as: LIDODERM Place 1 patch onto the skin daily. Remove & Discard patch within 12 hours or as directed by MD   metoprolol succinate 25 MG 24 hr tablet Commonly known as: TOPROL-XL Take 0.5 tablets  (12.5 mg total) by mouth daily. Start taking on: August 05, 2022 What changed:  medication strength how much to take additional instructions   pantoprazole 40 MG tablet Commonly known as: PROTONIX TAKE 1 TABLET BY MOUTH TWICE DAILY   rosuvastatin 10 MG tablet Commonly known as: CRESTOR Take 1 tablet (10 mg total) by mouth daily.   sertraline 50 MG tablet Commonly known as: ZOLOFT Take 1 tablet (50 mg total) by mouth daily.   Stool Softener 100 MG capsule Generic drug: docusate sodium Take 100 mg by mouth daily.   UNABLE TO FIND Take 100 mg by mouth daily. CBD '100mg'$    UNABLE TO FIND Med Name: Drew information for after-discharge care     Destination     [facility] .   Service: Skilled Nursing Contact information: [place] [phone]                    Discharge Exam: Danley Danker Weights   07/28/22 0432 08/01/22 1630 08/04/22 1037  Weight: 86.2 kg 79.3 kg 77.9 kg   General exam: awake, alert, no acute distress HEENT: atraumatic, clear conjunctiva, anicteric sclera, moist mucus membranes, hearing grossly normal  Respiratory system: CTAB, no wheezes, rales or rhonchi, normal respiratory effort. Cardiovascular system: normal S1/S2, RRR, 2/6 blowing systolic murmur,  no pedal edema.   Gastrointestinal system: soft, NT, ND, no HSM felt, +bowel sounds. Central nervous system: A&O x2. no gross focal neurologic deficits, normal speech Extremities: moves all , no edema, normal tone Skin: dry, intact, normal temperature, normal color, No rashes, lesions or ulcers Psychiatry: normal mood, congruent affect, judgement and insight appear normal   Condition at discharge: stable  The results of significant diagnostics from this hospitalization (including imaging, microbiology, ancillary and laboratory) are listed below for reference.   Imaging Studies: ECHOCARDIOGRAM  COMPLETE  Result Date: 07/29/2022    ECHOCARDIOGRAM REPORT   Patient Name:   RAJVIR ERNSTER Date of Exam: 07/29/2022 Medical Rec #:  [id number]    Height:       68.0 in Accession #:    [id number]   Weight:       190.0 lb Date of Birth:  18-Jul-1928   BSA:          2.000 m Patient Age:    4 years     BP:           95/52 mmHg Patient Gender: M            HR:  64 bpm. Exam Location:  [facility] Procedure: 2D Echo, Color Doppler and Cardiac Doppler Indications:     CHF-acute systolic Z85.88  History:         Patient has prior history of Echocardiogram examinations, most                  recent 08/07/2020. Risk Factors:Hypertension and Diabetes.                  Persistent Afib.  Sonographer:     Sherrie Sport Referring Phys:  [id number] JOINOMVE AGBATA Diagnosing Phys: Kathlyn Sacramento MD  Sonographer Comments: Suboptimal apical window and no subcostal window. IMPRESSIONS  1. Left ventricular ejection fraction, by estimation, is 60 to 65%. The left ventricle has normal function. The left ventricle has no regional wall motion abnormalities. There is moderate left ventricular hypertrophy. Left ventricular diastolic parameters are indeterminate.  2. Right ventricular systolic function is normal. The right ventricular size is normal. Tricuspid regurgitation signal is inadequate for assessing PA pressure.  3. The mitral valve is normal in structure. Moderate mitral valve regurgitation. No evidence of mitral stenosis.  4. The aortic valve is calcified. There is severe calcifcation of the aortic valve. There is severe thickening of the aortic valve. Aortic valve regurgitation is mild. Severe aortic valve stenosis. Aortic regurgitation PHT measures 477 msec. Aortic valve area, by VTI measures 0.43 cm. Aortic valve mean gradient measures 29.0 mmHg. FINDINGS  Left Ventricle: Left ventricular ejection fraction, by estimation, is 60 to 65%. The left ventricle has normal function. The left ventricle has no regional wall motion  abnormalities. The left ventricular internal cavity size was normal in size. There is  moderate left ventricular hypertrophy. Left ventricular diastolic parameters are indeterminate. Right Ventricle: The right ventricular size is normal. No increase in right ventricular wall thickness. Right ventricular systolic function is normal. Tricuspid regurgitation signal is inadequate for assessing PA pressure. Left Atrium: Left atrial size was normal in size. Right Atrium: Right atrial size was normal in size. Pericardium: There is no evidence of pericardial effusion. Mitral Valve: The mitral valve is normal in structure. Moderate mitral valve regurgitation. No evidence of mitral valve stenosis. Tricuspid Valve: The tricuspid valve is normal in structure. Tricuspid valve regurgitation is not demonstrated. No evidence of tricuspid stenosis. Aortic Valve: The aortic valve is calcified. There is severe calcifcation of the aortic valve. There is severe thickening of the aortic valve. Aortic valve regurgitation is mild. Aortic regurgitation PHT measures 477 msec. Severe aortic stenosis is present. Aortic valve mean gradient measures 29.0 mmHg. Aortic valve peak gradient measures 37.8 mmHg. Aortic valve area, by VTI measures 0.43 cm. Pulmonic Valve: The pulmonic valve was normal in structure. Pulmonic valve regurgitation is not visualized. No evidence of pulmonic stenosis. Aorta: The aortic root is normal in size and structure. Venous: The inferior vena cava was not well visualized. IAS/Shunts: No atrial level shunt detected by color flow Doppler.  LEFT VENTRICLE PLAX 2D LVIDd:         3.20 cm   Diastology LVIDs:         2.30 cm   LV e' medial:  4.03 cm/s LV PW:         1.30 cm   LV e' lateral: 5.11 cm/s LV IVS:        1.70 cm LVOT diam:     2.00 cm LV SV:         27 LV SV Index:   14 LVOT Area:  3.14 cm  RIGHT VENTRICLE RV Basal diam:  3.10 cm RV Mid diam:    3.10 cm RV S prime:     9.46 cm/s TAPSE (M-mode): 1.5 cm LEFT  ATRIUM             Index        RIGHT ATRIUM           Index LA diam:        3.90 cm 1.95 cm/m   RA Area:     11.20 cm LA Vol (A2C):   30.2 ml 15.10 ml/m  RA Volume:   21.20 ml  10.60 ml/m LA Vol (A4C):   23.5 ml 11.75 ml/m LA Biplane Vol: 28.5 ml 14.25 ml/m  AORTIC VALVE AV Area (Vmax):    0.47 cm AV Area (Vmean):   0.48 cm AV Area (VTI):     0.43 cm AV Vmax:           307.60 cm/s AV Vmean:          224.400 cm/s AV VTI:            0.646 m AV Peak Grad:      37.8 mmHg AV Mean Grad:      29.0 mmHg LVOT Vmax:         46.30 cm/s LVOT Vmean:        34.200 cm/s LVOT VTI:          0.088 m LVOT/AV VTI ratio: 0.14 AI PHT:            477 msec  AORTA Ao Root diam: 2.95 cm TRICUSPID VALVE TR Peak grad:   10.9 mmHg TR Vmax:        165.00 cm/s  SHUNTS Systemic VTI:  0.09 m Systemic Diam: 2.00 cm Kathlyn Sacramento MD Electronically signed by Kathlyn Sacramento MD Signature Date/Time: 07/29/2022/4:10:42 PM    Final    DG Ribs Unilateral Right  Result Date: 07/29/2022 CLINICAL DATA:  Right rib pain. EXAM: RIGHT RIBS - 2 VIEW COMPARISON:  Chest radiographs 07/28/2022 and 04/27/2022. CT chest 04/27/2022. FINDINGS: AP and oblique views of the right ribs are submitted, without an accompanying chest radiograph. Old fractures of the right 7th and 8th ribs anterolaterally are stable. No evidence of acute fracture or focal rib lesion. There is right lung atelectasis without pneumothorax or significant pleural effusion. Overall right lung aeration appears improved from recent chest radiographs. IMPRESSION: No evidence of acute right rib fracture or focal rib lesion. Old right rib fractures. Electronically Signed   By: Richardean Sale M.D.   On: 07/29/2022 12:27   DG Chest Port 1 View  Result Date: 07/28/2022 CLINICAL DATA:  89 year old male with history of cough and midsternal chest pain. EXAM: PORTABLE CHEST 1 VIEW COMPARISON:  Chest x-ray 04/27/2022. FINDINGS: Blunting of the costophrenic sulci bilaterally, indicative of small  bilateral pleural effusions. Opacity at the right base which likely reflects mild subsegmental atelectasis medially. Upper mediastinal contours are within normal limits. Atherosclerotic calcifications are noted in the thoracic aorta. Cephalization of the pulmonary vasculature with indistinct interstitial markings, concerning for a background of pulmonary edema. IMPRESSION: 1. The appearance the chest suggests mild congestive heart failure, as above. 2. Aortic atherosclerosis. Electronically Signed   By: Vinnie Langton M.D.   On: 07/28/2022 05:07    Microbiology: Results for orders placed or performed during the hospital encounter of 09/18/19  Blood Culture (routine x 2)     Status: None   Collection Time: 09/18/19  11:58 AM   Specimen: BLOOD  Result Value Ref Range Status   Specimen Description BLOOD FOREARM  Final   Special Requests   Final    BOTTLES DRAWN AEROBIC AND ANAEROBIC Blood Culture adequate volume   Culture   Final    NO GROWTH 5 DAYS Performed at [facility], [address] [place], [place] [address]    Report Status 09/23/2019 FINAL  Final  Blood Culture (routine x 2)     Status: None   Collection Time: 09/18/19 11:58 AM   Specimen: BLOOD  Result Value Ref Range Status   Specimen Description BLOOD BLOOD LEFT HAND  Final   Special Requests   Final    BOTTLES DRAWN AEROBIC AND ANAEROBIC Blood Culture adequate volume   Culture   Final    NO GROWTH 5 DAYS Performed at [facility], [address], [place], [place] [address]    Report Status 09/23/2019 FINAL  Final  Urine culture     Status: Abnormal   Collection Time: 09/18/19 11:58 AM   Specimen: Urine, Random  Result Value Ref Range Status   Specimen Description   Final    URINE, RANDOM Performed at [facility], [address], [place], [place] [address]    Special Requests   Final    NONE Performed at [facility], [address] [place],  [address]    Culture  MULTIPLE SPECIES PRESENT, SUGGEST RECOLLECTION (A)  Final   Report Status 09/20/2019 FINAL  Final  Respiratory Panel by RT PCR (Flu A&B, Covid) - Nasopharyngeal Swab     Status: Abnormal   Collection Time: 09/20/19  5:55 PM   Specimen: Nasopharyngeal Swab  Result Value Ref Range Status   SARS Coronavirus 2 by RT PCR POSITIVE (A) NEGATIVE Final    Comment: RESULT CALLED TO, READ BACK BY AND VERIFIED WITH: JACKIE PAGE 09/20/19 1946 KLW (NOTE) SARS-CoV-2 target nucleic acids are DETECTED. SARS-CoV-2 RNA is generally detectable in upper respiratory specimens  during the acute phase of infection. Positive results are indicative of the presence of the identified virus, but do not rule out bacterial infection or co-infection with other pathogens not detected by the test. Clinical correlation with patient history and other diagnostic information is necessary to determine patient infection status. The expected result is Negative. Fact Sheet for Patients:  PinkCheek.be Fact Sheet for Healthcare Providers: GravelBags.it This test is not yet approved or cleared by the [place] FDA and  has been authorized for detection and/or diagnosis of SARS-CoV-2 by FDA under an Emergency Use Authorization (EUA).  This EUA will remain in effect (meaning this test can be used) for the  duration of  the COVID-19 declaration under Section 564(b)(1) of the Act, 21 U.S.C. section 360bbb-3(b)(1), unless the authorization is terminated or revoked sooner.    Influenza A by PCR NEGATIVE NEGATIVE Final   Influenza B by PCR NEGATIVE NEGATIVE Final    Comment: (NOTE) The Xpert Xpress SARS-CoV-2/FLU/RSV assay is intended as an aid in  the diagnosis of influenza from Nasopharyngeal swab specimens and  should not be used as a sole basis for treatment. Nasal washings and  aspirates are unacceptable for Xpert Xpress SARS-CoV-2/FLU/RSV  testing. Fact Sheet for  Patients: PinkCheek.be Fact Sheet for Healthcare Providers: GravelBags.it This test is not yet approved or cleared by the [place] FDA and  has been authorized for detection and/or diagnosis of SARS-CoV-2 by  FDA under an Emergency Use Authorization (EUA). This EUA  will remain  in effect (meaning this test can be used) for the duration of the  Covid-19 declaration under Section 564(b)(1) of the Act, 21  U.S.C. section 360bbb-3(b)(1), unless the authorization is  terminated or revoked. Performed at [facility], [address] [place], [place] [address]     Labs: CBC: Recent Labs  Lab 07/29/22 0501 08/01/22 0412 08/04/22 0455  WBC 7.8 7.0 5.8  HGB 9.5* 10.0* 10.3*  HCT 30.6* 30.7* 32.8*  MCV 93.0 89.8 93.2  PLT 219 218 387   Basic Metabolic Panel: Recent Labs  Lab 07/31/22 0517 08/01/22 0412 08/02/22 0511 08/03/22 0608 08/04/22 0455  NA 134* 132* 134* 133* 137  K 3.3* 3.0* 3.7 4.0 4.1  CL 100 97* 99 102 101  CO2 '25 24 24 25 23  '$ GLUCOSE 162* 170* 148* 169* 167*  BUN '20 21 20 23 22  '$ CREATININE 1.33* 1.29* 1.22 1.34* 1.37*  CALCIUM 8.3* 8.2* 8.5* 8.5* 8.5*  MG 2.0  --   --   --  2.4   Liver Function Tests: No results for input(s): "AST", "ALT", "ALKPHOS", "BILITOT", "PROT", "ALBUMIN" in the last 168 hours. CBG: Recent Labs  Lab 08/03/22 0805 08/03/22 1156 08/03/22 1610 08/03/22 2227 08/04/22 0902  GLUCAP 169* 273* 254* 218* 181*    Discharge time spent: greater than 30 minutes.  Signed: Ezekiel Slocumb, DO [facility] 08/04/2022

## 2022-08-04 NOTE — Plan of Care (Signed)
  Problem: Education: Goal: Ability to demonstrate management of disease process will improve 08/04/2022 1154 by Shauna Hugh, RN Outcome: Adequate for Discharge 08/04/2022 0829 by Shauna Hugh, RN Outcome: Progressing Goal: Ability to verbalize understanding of medication therapies will improve 08/04/2022 1154 by Shauna Hugh, RN Outcome: Adequate for Discharge 08/04/2022 0829 by Shauna Hugh, RN Outcome: Progressing Goal: Individualized Educational Video(s) 08/04/2022 1154 by Shauna Hugh, RN Outcome: Adequate for Discharge 08/04/2022 0829 by Shauna Hugh, RN Outcome: Progressing   Problem: Activity: Goal: Capacity to carry out activities will improve 08/04/2022 1154 by Shauna Hugh, RN Outcome: Adequate for Discharge 08/04/2022 0829 by Shauna Hugh, RN Outcome: Progressing   Problem: Cardiac: Goal: Ability to achieve and maintain adequate cardiopulmonary perfusion will improve 08/04/2022 1154 by Shauna Hugh, RN Outcome: Adequate for Discharge 08/04/2022 0829 by Shauna Hugh, RN Outcome: Progressing   Problem: Education: Goal: Knowledge of General Education information will improve Description: Including pain rating scale, medication(s)/side effects and non-pharmacologic comfort measures 08/04/2022 1154 by Shauna Hugh, RN Outcome: Adequate for Discharge 08/04/2022 0829 by Shauna Hugh, RN Outcome: Progressing   Problem: Health Behavior/Discharge Planning: Goal: Ability to manage health-related needs will improve 08/04/2022 1154 by Shauna Hugh, RN Outcome: Adequate for Discharge 08/04/2022 0829 by Shauna Hugh, RN Outcome: Progressing   Problem: Clinical Measurements: Goal: Ability to maintain clinical measurements within normal limits will improve 08/04/2022 1154 by Shauna Hugh, RN Outcome: Adequate for Discharge 08/04/2022 0829 by Shauna Hugh, RN Outcome: Progressing Goal: Will remain free from infection 08/04/2022 1154 by Shauna Hugh, RN Outcome:  Adequate for Discharge 08/04/2022 0829 by Shauna Hugh, RN Outcome: Progressing Goal: Diagnostic test results will improve 08/04/2022 1154 by Shauna Hugh, RN Outcome: Adequate for Discharge 08/04/2022 0829 by Shauna Hugh, RN Outcome: Progressing Goal: Respiratory complications will improve 08/04/2022 1154 by Shauna Hugh, RN Outcome: Adequate for Discharge 08/04/2022 0829 by Shauna Hugh, RN Outcome: Progressing Goal: Cardiovascular complication will be avoided 08/04/2022 1154 by Shauna Hugh, RN Outcome: Adequate for Discharge 08/04/2022 0829 by Shauna Hugh, RN Outcome: Progressing   Problem: Activity: Goal: Risk for activity intolerance will decrease 08/04/2022 1154 by Shauna Hugh, RN Outcome: Adequate for Discharge 08/04/2022 0829 by Shauna Hugh, RN Outcome: Progressing   Problem: Nutrition: Goal: Adequate nutrition will be maintained 08/04/2022 1154 by Shauna Hugh, RN Outcome: Adequate for Discharge 08/04/2022 0829 by Shauna Hugh, RN Outcome: Progressing   Problem: Coping: Goal: Level of anxiety will decrease 08/04/2022 1154 by Shauna Hugh, RN Outcome: Adequate for Discharge 08/04/2022 0829 by Shauna Hugh, RN Outcome: Progressing   Problem: Elimination: Goal: Will not experience complications related to bowel motility 08/04/2022 1154 by Shauna Hugh, RN Outcome: Adequate for Discharge 08/04/2022 0829 by Shauna Hugh, RN Outcome: Progressing Goal: Will not experience complications related to urinary retention 08/04/2022 1154 by Shauna Hugh, RN Outcome: Adequate for Discharge 08/04/2022 0829 by Shauna Hugh, RN Outcome: Progressing   Problem: Pain Managment: Goal: General experience of comfort will improve 08/04/2022 1154 by Shauna Hugh, RN Outcome: Adequate for Discharge 08/04/2022 0829 by Shauna Hugh, RN Outcome: Progressing   Problem: Safety: Goal: Ability to remain free from injury will improve 08/04/2022 1154 by Shauna Hugh,  RN Outcome: Adequate for Discharge 08/04/2022 0829 by Shauna Hugh, RN Outcome: Progressing   Problem: Skin Integrity: Goal: Risk for impaired skin integrity will decrease 08/04/2022 1154 by Shauna Hugh, RN Outcome: Adequate for Discharge 08/04/2022 0829 by Shauna Hugh, RN Outcome: Progressing

## 2022-08-04 NOTE — Care Management Important Message (Signed)
Important Message  Patient Details  Name: Timothy [name] MRN: [id number] Date of Birth: 1928-07-13   Medicare Important Message Given:  Yes     Dannette Barbara 08/04/2022, 11:35 AM

## 2022-08-04 NOTE — Plan of Care (Signed)

## 2022-08-04 NOTE — TOC Transition Note (Signed)
Transition of Care [facility]) - CM/SW Discharge Note   Patient Details  Name: Timothy [name] MRN: [id number] Date of Birth: 1929-03-02  Transition of Care [facility]) CM/SW Contact:  Laurena Slimmer, RN Phone Number: 08/04/2022, 12:02 PM   Clinical Narrative:     Discharge order received  Discharge summary sent to [facility] with Seth Bake in admissions at [place]. Patient assigned room #115. Number for nurse to give report is [phone].  Patient Daughter notified of discharge.  EMS arranged.  Face sheet and medical necessity forms printed to floor to be added to EMS packet.  Nurse notified with details for room number and where to call report.      TOC signing off.   Final next level of care: Skilled Nursing Facility Barriers to Discharge: Barriers Resolved   Patient Goals and CMS Choice      Discharge Placement                  Patient to be transferred to facility by: [facility] Name of family member notified: [name], daughter Patient and family notified of of transfer: 08/04/22  Discharge Plan and Services Additional resources added to the After Visit Summary for                                       Social Determinants of Health (SDOH) Interventions SDOH Screenings   Food Insecurity: No Food Insecurity (07/30/2022)  Housing: Low Risk  (07/30/2022)  Transportation Needs: No Transportation Needs (07/30/2022)  Utilities: Not At Risk (07/30/2022)  Tobacco Use: Low Risk  (08/04/2022)     Readmission Risk Interventions     No data to display

## 2022-08-06 ENCOUNTER — Encounter: Payer: Medicare Other | Admitting: Family

## 2022-08-06 ENCOUNTER — Non-Acute Institutional Stay (SKILLED_NURSING_FACILITY): Payer: Medicare Other | Admitting: Student

## 2022-08-06 ENCOUNTER — Encounter: Payer: Self-pay | Admitting: Student

## 2022-08-06 DIAGNOSIS — E871 Hypo-osmolality and hyponatremia: Secondary | ICD-10-CM

## 2022-08-06 DIAGNOSIS — I350 Nonrheumatic aortic (valve) stenosis: Secondary | ICD-10-CM | POA: Diagnosis not present

## 2022-08-06 DIAGNOSIS — I5032 Chronic diastolic (congestive) heart failure: Secondary | ICD-10-CM | POA: Diagnosis not present

## 2022-08-06 DIAGNOSIS — E1169 Type 2 diabetes mellitus with other specified complication: Secondary | ICD-10-CM

## 2022-08-06 DIAGNOSIS — I480 Paroxysmal atrial fibrillation: Secondary | ICD-10-CM | POA: Diagnosis not present

## 2022-08-06 DIAGNOSIS — E785 Hyperlipidemia, unspecified: Secondary | ICD-10-CM

## 2022-08-06 DIAGNOSIS — I739 Peripheral vascular disease, unspecified: Secondary | ICD-10-CM | POA: Diagnosis not present

## 2022-08-06 DIAGNOSIS — N179 Acute kidney failure, unspecified: Secondary | ICD-10-CM

## 2022-08-06 DIAGNOSIS — G20A1 Parkinson's disease without dyskinesia, without mention of fluctuations: Secondary | ICD-10-CM

## 2022-08-06 NOTE — Progress Notes (Signed)
Provider:  Dr. Dewayne Shorter Location:  Other [place].  Nursing Home Room Number: [facility] 115A Place of Service:  SNF (31)  PCP: Housecalls, Doctors Making Patient Care Team: Housecalls, Doctors Making as PCP - General (Geriatric Medicine) [name], MD as PCP - Cardiology (Cardiology) Byrnett, Forest Gleason, MD as Consulting Physician (General Surgery) [name], MD as Consulting Physician (Cardiology)  Extended Emergency Contact Information Primary Emergency Contact: Dewayne Shorter of [place] Phone: [phone] Mobile Phone: [phone] Relation: Daughter Secondary Emergency Contact: Geraldo,Gerald Mobile Phone: [phone] Relation: Son  Code Status: DNR Goals of Care: Advanced Directive information    08/06/2022    9:37 AM  Advanced Directives  Does Patient Have a Medical Advance Directive? Yes  Type of Advance Directive Out of facility DNR (pink MOST or yellow form)  Does patient want to make changes to medical advance directive? No - Patient declined      Chief Complaint  Patient presents with   New Admit To SNF    Admission to SNF    HPI: Patient is a 89 y.o. male seen today for admission to [facility]   He lived at [facility] an [place] living facility. He has had a caregiver for the last three years. He uses a Rolator to get around and the chair. He gets help with getting dressed in the morning, getting ready for breakfast, anything he needs him to do he  Gathering medications. He gets some help with transportation He gets on a bus to go to eating places as well.   He has three daughters and one son.  Verdene Lennert has been his primary care giver. He woul dlike her to be the primary person to take care of things.   His breathing is alright and denies chest pain at this time.   His caregiver Epifania Gore is here at bedside -- he has had 1 fall in the last year. No fractures. He had another fall 1-2 months ago and he slid off of  the bed however no injuries. It's been about 1 year ago and it was in the bathroom.   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 HE had "clogs in both legs." He had to get veins removed at [facility]. He had a back operation for his sciatic nerve previously.   Past Medical History:  Diagnosis Date   (HFpEF) heart failure with preserved ejection fraction ([place])    a. 05/2019 Echo: EF 65-70%, mod LVH. Gr2 DD. Mod AS; b. 08/2020 Echo: EF 65-70%, mod LVH, GrI DD, nl RV fxn, severe AS.   AAA (abdominal aortic aneurysm) ([place])    a. 2014 Abd U/S: 3.5cm AAA; b. 10/20189 Abd U/S: 3.3cm AAA; c. 01/2018 CT Abd/Pelvis: 2.7cm infrarenal AAA - rec f/u U/S in 5 yrs.   Actinic keratosis    Back pain    Basal cell carcinoma 08/22/2019   Left mid ear helix. Nodular pattern, not treated pt was sched for [facility] 09/12/19 and canceled notes in [place], re bx on 03/11/22 bx proven BCC, Mohs 03/26/2022   Basal cell carcinoma 04/30/2020   Left cheek. Superficial and nodular patterns. excised   Coronary artery calcification seen on CT scan    Esophageal reflux    Essential hypertension    History of stress test    a. 01/2012 MV: EF 63%, no rwma. No ischemia/infarct. Low risk.   Hypokalemia 08/01/2022   Mixed hyperlipidemia  Osteitis deformans without mention of bone tumor    Paget's Disease   PAD (peripheral artery disease) ([place])    a. 12/2012 and 05/2013 s/p bilat LE bypass @ [facility] in setting of bilat popliteal aneurysms; b. 05/2019 LE Duplex: R PT 50-74%, Nl L Leg.   Parkinson's disease    Persistent atrial fibrillation ([place])    a. 02/2015 Event monitor: long periods of Afib--prev on flecainide; b. CHA2DS2VASc = 8-->Eliquis 5 bid.   Retinal detachment    Senile osteoporosis    Severe aortic stenosis    a. 05/2019 Echo: Mod AS. Mean grad 29.5 mmHg. Peak grad 55.7 mmHg. AVA 1.28 cm^2 (VTI); b. 08/2020 Echo: EF 65-70%, mod LVH, GrI DD, nl RV fxn, triv MR, mild-mod AI. Sev AS (AVA  0.87cm^2, mean grad 40, AoV Vmax 4.80ms). Mild dil of Ao root.   Skin cancer    Squamous cell carcinoma of skin 04/02/2017   Left cheek. SCCis. Referred for Moh's, clear at appt 06/18/17. Pt opted to treat with 5FU over Mohs.   Swelling of right lower extremity    a. 05/2019 LE U/S: No DVT. 50-64% R PT artery stenosis.   TIA (transient ischemic attack)    Type II diabetes mellitus ([place]    Past Surgical History:  Procedure Laterality Date   APPENDECTOMY     BACK SURGERY     CATARACT EXTRACTION     COLONOSCOPY  2006,2009   ESOPHAGEAL DILATION     GALLBLADDER SURGERY     HERNIA REPAIR     Lower extremity arterial bypass Bilateral    RETINAL DETACHMENT SURGERY     TONSILLECTOMY      reports that he has never smoked. He has never used smokeless tobacco. He reports that he does not drink alcohol and does not use drugs. 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  Tobacco Use   Smoking status: Never   Smokeless tobacco: Never  Vaping Use   Vaping Use: Never used  Substance and Sexual Activity   Alcohol use: No   Drug use: No   Sexual activity: Not Currently  Other Topics Concern   Not on file  Social History Narrative   Not on file   Social Determinants of Health   Financial Resource Strain: Not on file  Food Insecurity: No Food Insecurity (07/30/2022)   Hunger Vital Sign    Worried About Running Out of Food in the Last Year: Never true    Ran Out of Food in the Last Year: Never true  Transportation Needs: No Transportation Needs (07/30/2022)   PRAPARE - THydrologist(Medical): No    Lack of Transportation (Non-Medical): No  Physical Activity: Not on file  Stress: Not on file  Social Connections: Not on file  Intimate Partner Violence:  Not At Risk (07/30/2022)   Humiliation, Afraid, Rape, and Kick questionnaire    Fear of Current or Ex-Partner: No    Emotionally Abused: No    Physically Abused: No    Sexually Abused: No    Functional Status Survey:    Family History  Problem Relation Age of Onset   Heart disease Mother    Diabetes Father    Retinal detachment Brother  Heart disease Brother    Stroke Brother    Diabetes Other        grandparents    Health Maintenance  Topic Date Due   Medicare Annual Wellness (AWV)  Never done   Zoster Vaccines- Shingrix (1 of 2) Never done   FOOT EXAM  06/25/2016   OPHTHALMOLOGY EXAM  07/03/2018   DTaP/Tdap/Td (2 - Td or Tdap) 11/24/2018   INFLUENZA VACCINE  02/04/2022   COVID-19 Vaccine (4 - 2023-24 season) 03/07/2022   HEMOGLOBIN A1C  01/26/2023   Pneumonia Vaccine 60+ Years old  Completed   HPV VACCINES  Aged Out    No Known Allergies  Outpatient Encounter Medications as of 08/06/2022  Medication Sig   acetaminophen (TYLENOL) 325 MG tablet Take 2 tablets (650 mg total) by mouth every 6 (six) hours as needed for mild pain or headache (fever >/= 101).   apixaban (ELIQUIS) 5 MG TABS tablet Take 1 tablet (5 mg total) by mouth 2 (two) times daily.   cyanocobalamin (,VITAMIN B-12,) 1000 MCG/ML injection Inject 0.5 mLs into the muscle every 30 (thirty) days.   diclofenac Sodium (VOLTAREN) 1 % GEL Apply 4 g topically 4 (four) times daily as needed (upper back or rib cage pain).   famotidine (PEPCID) 20 MG tablet Take 20 mg by mouth at bedtime.   FARXIGA 10 MG TABS tablet TAKE 1 TABLET BY MOUTH DAILY   furosemide (LASIX) 20 MG tablet Take 1 tablet (20 mg total) by mouth daily. Take additional 20 mg AS NEEDED with supper if progressive weight gain or worsening dyspnea   glipiZIDE-metformin (METAGLIP) 5-500 MG tablet TAKE 1 TABLET BY MOUTH TWICE DAILY   GLUCOSAMINE-CHONDROITIN PO Take by mouth daily.   hydrocortisone 2.5 % cream Apply 1-2 times daily to areas at neck, face,  scalp as needed for itch.   JANUVIA 100 MG tablet TAKE 1 TABLET BY MOUTH DAILY   ketoconazole (NIZORAL) 2 % cream once daily to pink scaly patches at face, ears and scalp.   lidocaine (LIDODERM) 5 % Place 1 patch onto the skin daily. Remove & Discard patch within 12 hours or as directed by MD   metoprolol succinate (TOPROL-XL) 25 MG 24 hr tablet Take 0.5 tablets (12.5 mg total) by mouth daily.   Multiple Vitamins-Minerals (CEROVITE SENIOR PO) Take 1 tablet by mouth daily.   pantoprazole (PROTONIX) 40 MG tablet TAKE 1 TABLET BY MOUTH TWICE DAILY   rosuvastatin (CRESTOR) 10 MG tablet Take 1 tablet (10 mg total) by mouth daily.   saccharomyces boulardii (FLORASTOR) 250 MG capsule Take 250 mg by mouth daily.   sertraline (ZOLOFT) 50 MG tablet Take 1 tablet (50 mg total) by mouth daily.   STOOL SOFTENER 100 MG capsule Take 100 mg by mouth daily.   [DISCONTINUED] benazepril (LOTENSIN) 5 MG tablet  (Patient not taking: Reported on 08/04/2022)   [DISCONTINUED] Calcium Citrate-Vitamin D (CALCIUM + D PO) Take 1 tablet by mouth daily.    [DISCONTINUED] UNABLE TO FIND Take 100 mg by mouth daily. CBD '100mg'$    [DISCONTINUED] UNABLE TO FIND Med Name: [place]   [DISCONTINUED] valACYclovir (VALTREX) 1000 MG tablet    No facility-administered encounter medications on file as of 08/06/2022.    Review of Systems  Vitals:   08/06/22 0927  BP: 138/70  Pulse: 82  Resp: 18  Temp: 97.7 F (36.5 C)  SpO2: 91%  Weight: 173 lb 3.2 oz (78.6 kg)  Height: '5\' 8"'$  (1.727 m)   Body mass  index is 26.33 kg/m. Physical Exam Vitals reviewed.  Constitutional:      Comments: Frail sitting in wheelchair, flat affect and masked facies.   Cardiovascular:     Rate and Rhythm: Normal rate.     Pulses: Normal pulses.  Pulmonary:     Effort: Pulmonary effort is normal.  Abdominal:     General: Bowel sounds are normal.     Palpations: Abdomen is soft.  Skin:    General: Skin is warm and dry.   Neurological:     Mental Status: He is alert and oriented to person, place, and time.     Labs reviewed: Basic Metabolic Panel: Recent Labs    07/31/22 0517 08/01/22 0412 08/02/22 0511 08/03/22 0608 08/04/22 0455  NA 134*   < > 134* 133* 137  K 3.3*   < > 3.7 4.0 4.1  CL 100   < > 99 102 101  CO2 25   < > '24 25 23  '$ GLUCOSE 162*   < > 148* 169* 167*  BUN 20   < > '20 23 22  '$ CREATININE 1.33*   < > 1.22 1.34* 1.37*  CALCIUM 8.3*   < > 8.5* 8.5* 8.5*  MG 2.0  --   --   --  2.4   < > = values in this interval not displayed.   Liver Function Tests: Recent Labs    04/27/22 1008  AST 27  ALT 6  ALKPHOS 48  BILITOT 0.7  PROT 6.4*  ALBUMIN 3.4*   No results for input(s): "LIPASE", "AMYLASE" in the last 8760 hours. No results for input(s): "AMMONIA" in the last 8760 hours. CBC: Recent Labs    04/27/22 1008 07/28/22 0433 07/29/22 0501 08/01/22 0412 08/04/22 0455  WBC 6.1   < > 7.8 7.0 5.8  NEUTROABS 3.0  --   --   --   --   HGB 10.1*   < > 9.5* 10.0* 10.3*  HCT 33.3*   < > 30.6* 30.7* 32.8*  MCV 97.1   < > 93.0 89.8 93.2  PLT 167   < > 219 218 235   < > = values in this interval not displayed.   Cardiac Enzymes: No results for input(s): "CKTOTAL", "CKMB", "CKMBINDEX", "TROPONINI" in the last 8760 hours. BNP: Invalid input(s): "POCBNP" Lab Results  Component Value Date   HGBA1C 7.7 (H) 07/28/2022   Lab Results  Component Value Date   TSH 1.950 12/10/2017   No results found for: "VITAMINB12" No results found for: "FOLATE" Lab Results  Component Value Date   IRON 62 02/21/2016   TIBC 372 02/21/2016   FERRITIN 33 07/29/2022    Imaging and Procedures obtained prior to SNF admission: ECHOCARDIOGRAM COMPLETE  Result Date: 07/29/2022    ECHOCARDIOGRAM REPORT   Patient Name:   Timothy [name] Date of Exam: 07/29/2022 Medical Rec #:  [id number]    Height:       68.0 in Accession #:    [id number]   Weight:       190.0 lb Date of Birth:  [birth year]   BSA:           2.000 m Patient Age:    89 years     BP:           95/52 mmHg Patient Gender: M            HR:           64 bpm. Exam Location:  [place]  Procedure: 2D Echo, Color Doppler and Cardiac Doppler Indications:     CHF-acute systolic A21.30  History:         Patient has prior history of Echocardiogram examinations, most                  recent 08/07/2020. Risk Factors:Hypertension and Diabetes.                  Persistent Afib.  Sonographer:     Sherrie Sport Referring Phys:  [id number] AGBATA Diagnosing Phys: Kathlyn Sacramento MD  Sonographer Comments: Suboptimal apical window and no subcostal window. IMPRESSIONS  1. Left ventricular ejection fraction, by estimation, is 60 to 65%. The left ventricle has normal function. The left ventricle has no regional wall motion abnormalities. There is moderate left ventricular hypertrophy. Left ventricular diastolic parameters are indeterminate.  2. Right ventricular systolic function is normal. The right ventricular size is normal. Tricuspid regurgitation signal is inadequate for assessing PA pressure.  3. The mitral valve is normal in structure. Moderate mitral valve regurgitation. No evidence of mitral stenosis.  4. The aortic valve is calcified. There is severe calcifcation of the aortic valve. There is severe thickening of the aortic valve. Aortic valve regurgitation is mild. Severe aortic valve stenosis. Aortic regurgitation PHT measures 477 msec. Aortic valve area, by VTI measures 0.43 cm. Aortic valve mean gradient measures 29.0 mmHg. FINDINGS  Left Ventricle: Left ventricular ejection fraction, by estimation, is 60 to 65%. The left ventricle has normal function. The left ventricle has no regional wall motion abnormalities. The left ventricular internal cavity size was normal in size. There is  moderate left ventricular hypertrophy. Left ventricular diastolic parameters are indeterminate. Right Ventricle: The right ventricular size is normal. No increase in right ventricular wall  thickness. Right ventricular systolic function is normal. Tricuspid regurgitation signal is inadequate for assessing PA pressure. Left Atrium: Left atrial size was normal in size. Right Atrium: Right atrial size was normal in size. Pericardium: There is no evidence of pericardial effusion. Mitral Valve: The mitral valve is normal in structure. Moderate mitral valve regurgitation. No evidence of mitral valve stenosis. Tricuspid Valve: The tricuspid valve is normal in structure. Tricuspid valve regurgitation is not demonstrated. No evidence of tricuspid stenosis. Aortic Valve: The aortic valve is calcified. There is severe calcifcation of the aortic valve. There is severe thickening of the aortic valve. Aortic valve regurgitation is mild. Aortic regurgitation PHT measures 477 msec. Severe aortic stenosis is present. Aortic valve mean gradient measures 29.0 mmHg. Aortic valve peak gradient measures 37.8 mmHg. Aortic valve area, by VTI measures 0.43 cm. Pulmonic Valve: The pulmonic valve was normal in structure. Pulmonic valve regurgitation is not visualized. No evidence of pulmonic stenosis. Aorta: The aortic root is normal in size and structure. Venous: The inferior vena cava was not well visualized. IAS/Shunts: No atrial level shunt detected by color flow Doppler.  LEFT VENTRICLE PLAX 2D LVIDd:         3.20 cm   Diastology LVIDs:         2.30 cm   LV e' medial:  4.03 cm/s LV PW:         1.30 cm   LV e' lateral: 5.11 cm/s LV IVS:        1.70 cm LVOT diam:     2.00 cm LV SV:         27 LV SV Index:   14 LVOT Area:     3.14 cm  RIGHT  VENTRICLE RV Basal diam:  3.10 cm RV Mid diam:    3.10 cm RV S prime:     9.46 cm/s TAPSE (M-mode): 1.5 cm LEFT ATRIUM             Index        RIGHT ATRIUM           Index LA diam:        3.90 cm 1.95 cm/m   RA Area:     11.20 cm LA Vol (A2C):   30.2 ml 15.10 ml/m  RA Volume:   21.20 ml  10.60 ml/m LA Vol (A4C):   23.5 ml 11.75 ml/m LA Biplane Vol: 28.5 ml 14.25 ml/m  AORTIC VALVE  AV Area (Vmax):    0.47 cm AV Area (Vmean):   0.48 cm AV Area (VTI):     0.43 cm AV Vmax:           307.60 cm/s AV Vmean:          224.400 cm/s AV VTI:            0.646 m AV Peak Grad:      37.8 mmHg AV Mean Grad:      29.0 mmHg LVOT Vmax:         46.30 cm/s LVOT Vmean:        34.200 cm/s LVOT VTI:          0.088 m LVOT/AV VTI ratio: 0.14 AI PHT:            477 msec  AORTA Ao Root diam: 2.95 cm TRICUSPID VALVE TR Peak grad:   10.9 mmHg TR Vmax:        165.00 cm/s  SHUNTS Systemic VTI:  0.09 m Systemic Diam: 2.00 cm Kathlyn Sacramento MD Electronically signed by Kathlyn Sacramento MD Signature Date/Time: 07/29/2022/4:10:42 PM    Final    DG Ribs Unilateral Right  Result Date: 07/29/2022 CLINICAL DATA:  Right rib pain. EXAM: RIGHT RIBS - 2 VIEW COMPARISON:  Chest radiographs 07/28/2022 and 04/27/2022. CT chest 04/27/2022. FINDINGS: AP and oblique views of the right ribs are submitted, without an accompanying chest radiograph. Old fractures of the right 7th and 8th ribs anterolaterally are stable. No evidence of acute fracture or focal rib lesion. There is right lung atelectasis without pneumothorax or significant pleural effusion. Overall right lung aeration appears improved from recent chest radiographs. IMPRESSION: No evidence of acute right rib fracture or focal rib lesion. Old right rib fractures. Electronically Signed   By: Richardean Sale M.D.   On: 07/29/2022 12:27    Assessment/Plan 1. Chronic heart failure with preserved ejection fraction ([place]) 2. Symptomatic severe aortic stenosis with normal ejection fraction Patient appears euvolemic on exam. Denies chest pain or SOB at this time. Continue scheduled lasix 20 mg daily as well as Prn for weight gain. Continue Toprol. Appreciate echo results outlined above. Continue Crestor 10 mg daily.   3. Paroxysmal atrial fibrillation (HCC) Rate controlled on toprol. Continue Eliquis for [facility].   4. PAD (peripheral artery disease) (HCC) No symptoms at this time s/p  surgical intervention. Continue   5. Parkinson's disease, unspecified whether dyskinesia present, unspecified whether manifestations fluctuate Numerous falls at home. Here for PT. Continue sinemet   6. AKI (acute kidney injury) ([place]) Patient's Cr increased during hospitalization. Will plan to follow up BMP. If inidicated, dose reduction or avoidance of nephrotoxic medications . Creatinine  Date/Time Value Ref Range Status  12/24/2011 06:15 PM 1.30 0.60 -  1.30 mg/dL Final   Creatinine, Ser  Date/Time Value Ref Range Status  08/04/2022 04:55 AM 1.37 (H) 0.61 - 1.24 mg/dL Final  ]  7. Type 2 diabetes mellitus with hyperlipidemia (HCC) Most recetn A1c at goal for age. Continue glipizide, januvia, farxiga.  Hemoglobin A1C  Date/Time Value Ref Range Status  02/21/2016 12:00 AM 7.6  Final   HB A1C ([facility] DCA - WAIVED)  Date/Time Value Ref Range Status  12/06/2018 01:39 PM 7.7 (H) <7.0 % Final    Comment:                                          Diabetic Adult            <7.0                                       Healthy Adult        4.3 - 5.7                                                           (DCCT/NGSP) American Diabetes Association's Summary of Glycemic Recommendations for Adults with Diabetes: Hemoglobin A1c <7.0%. More stringent glycemic goals (A1c <6.0%) may further reduce complications at the cost of increased risk of hypoglycemia.    Hgb A1c MFr Bld  Date/Time Value Ref Range Status  07/28/2022 09:49 AM 7.7 (H) 4.8 - 5.6 % Final    Comment:    (NOTE) Pre diabetes:          5.7%-6.4%  Diabetes:              >6.4%  Glycemic control for   <7.0% adults with diabetes   ]  8. Hyponatremia BMP ordered. Consider discontinuing or dose reduction of sertraline based on results.     Family/ staff Communication: nursing  Labs/tests ordered: CBC, BMP

## 2022-08-11 ENCOUNTER — Encounter: Payer: Self-pay | Admitting: Student

## 2022-08-11 ENCOUNTER — Non-Acute Institutional Stay (SKILLED_NURSING_FACILITY): Payer: Medicare Other | Admitting: Student

## 2022-08-11 DIAGNOSIS — E1169 Type 2 diabetes mellitus with other specified complication: Secondary | ICD-10-CM

## 2022-08-11 DIAGNOSIS — E785 Hyperlipidemia, unspecified: Secondary | ICD-10-CM | POA: Diagnosis not present

## 2022-08-11 NOTE — Progress Notes (Unsigned)
Location:  Other [name].  Nursing Home Room Number: [name] of Service:  SNF [phone]) Provider:  Dr. Dewayne [name]  PCP: Timothy [name], [facility]  Patient Care Team: [facility], [facility] as PCP - General (Geriatric Medicine) Timothy Merritts, MD as PCP - Cardiology (Cardiology) Timothy Castilla Forest Gleason, MD as Consulting Physician (General Surgery) Timothy Merritts, MD as Consulting Physician (Cardiology)  Extended Emergency Contact Information Primary Emergency Contact: Timothy [name] of [place] Phone: [phone] Mobile Phone: [phone] Relation: Daughter Secondary Emergency Contact: [name],Timothy Mobile Phone: [phone] Relation: Son  Code Status:  DNR Goals of care: Advanced Directive information    08/11/2022    1:58 PM  Advanced Directives  Does Patient Have a Medical Advance Directive? Yes  Type of Advance Directive Out of facility DNR (pink MOST or yellow form)  Does patient want to make changes to medical advance directive? No - Patient declined     Chief Complaint  Patient presents with   Medical Management of Chronic Issues    Medical Management of Chronic Issues. Diabetes.     HPI:  Pt is a 89 y.o. male seen today for medical management of chronic diseases.  Patient has been fine. He is doing fine with therapy. He has noticed the increase in his glucose since admission. He has not had any issues at home and is wondering what can be done since he has had issues with his glucose checks here.    Past Medical History:  Diagnosis Date   (HFpEF) heart failure with preserved ejection fraction ([place])    a. 05/2019 Echo: EF 65-70%, mod LVH. Gr2 DD. Mod AS; b. 08/2020 Echo: EF 65-70%, mod LVH, GrI DD, nl RV fxn, severe AS.   AAA (abdominal aortic aneurysm) ([place])    a. 2014 Abd U/S: 3.5cm AAA; b. 10/20189 Abd U/S: 3.3cm AAA; c. 01/2018 CT Abd/Pelvis: 2.7cm infrarenal AAA - rec f/u U/S in 5 yrs.   Actinic keratosis    Back  pain    Basal cell carcinoma 08/22/2019   Left mid ear helix. Nodular pattern, not treated pt was sched for [facility] 09/12/19 and canceled notes in [facility], re bx on 03/11/22 bx proven BCC, Mohs 03/26/2022   Basal cell carcinoma 04/30/2020   Left cheek. Superficial and nodular patterns. excised   Coronary artery calcification seen on CT scan    Esophageal reflux    Essential hypertension    History of stress test    a. 01/2012 MV: EF 63%, no rwma. No ischemia/infarct. Low risk.   Hypokalemia 08/01/2022   Mixed hyperlipidemia    Osteitis deformans without mention of bone tumor    Paget's Disease   PAD (peripheral artery disease) ([place])    a. 12/2012 and 05/2013 s/p bilat LE bypass @ [facility] in setting of bilat popliteal aneurysms; b. 05/2019 LE Duplex: R PT 50-74%, Nl L Leg.   Parkinson's disease    Persistent atrial fibrillation ([place])    a. 02/2015 Event monitor: long periods of Afib--prev on flecainide; b. CHA2DS2VASc = 8-->Eliquis 5 bid.   Retinal detachment    Senile osteoporosis    Severe aortic stenosis    a. 05/2019 Echo: Mod AS. Mean grad 29.5 mmHg. Peak grad 55.7 mmHg. AVA 1.28 cm^2 (VTI); b. 08/2020 Echo: EF 65-70%, mod LVH, GrI DD, nl RV fxn, triv MR, mild-mod AI. Sev AS (AVA 0.87cm^2, mean grad 40, AoV Vmax 4.5ms). Mild dil of Ao root.   Skin cancer    Squamous  cell carcinoma of skin 04/02/2017   Left cheek. SCCis. Referred for Moh's, clear at appt 06/18/17. Pt opted to treat with 5FU over Mohs.   Swelling of right lower extremity    a. 05/2019 LE U/S: No DVT. 50-64% R PT artery stenosis.   TIA (transient ischemic attack)    Type II diabetes mellitus ([place])    Past Surgical History:  Procedure Laterality Date   APPENDECTOMY     BACK SURGERY     CATARACT EXTRACTION     COLONOSCOPY  2006,2009   ESOPHAGEAL DILATION     GALLBLADDER SURGERY     HERNIA REPAIR     Lower extremity arterial bypass Bilateral    RETINAL DETACHMENT SURGERY     TONSILLECTOMY      No Known  Allergies  Outpatient Encounter Medications as of 08/11/2022  Medication Sig   acetaminophen (TYLENOL) 325 MG tablet Take 2 tablets (650 mg total) by mouth every 6 (six) hours as needed for mild pain or headache (fever >/= 101).   apixaban (ELIQUIS) 5 MG TABS tablet Take 1 tablet (5 mg total) by mouth 2 (two) times daily.   Calcium Carb-Cholecalciferol (CALCIUM PLUS VITAMIN D) 500-5 MG-MCG TABS Take 1 tablet by mouth daily.   cyanocobalamin (,VITAMIN B-12,) 1000 MCG/ML injection Inject 0.5 mLs into the muscle every 30 (thirty) days.   diclofenac Sodium (VOLTAREN) 1 % GEL Apply 4 g topically 4 (four) times daily as needed (upper back or rib cage pain).   famotidine (PEPCID) 20 MG tablet Take 20 mg by mouth at bedtime.   FARXIGA 10 MG TABS tablet TAKE 1 TABLET BY MOUTH DAILY   furosemide (LASIX) 20 MG tablet Take 1 tablet (20 mg total) by mouth daily. Take additional 20 mg AS NEEDED with supper if progressive weight gain or worsening dyspnea   glipiZIDE-metformin (METAGLIP) 5-500 MG tablet Take 2 tablets by mouth 2 (two) times daily before a meal.   GLUCOSAMINE-CHONDROITIN PO Take 1 tablet by mouth daily.   hydrocortisone 2.5 % cream Apply 1-2 times daily to areas at neck, face, scalp as needed for itch.   JANUVIA 100 MG tablet TAKE 1 TABLET BY MOUTH DAILY   ketoconazole (NIZORAL) 2 % cream once daily to pink scaly patches at face, ears and scalp.   lidocaine (LIDODERM) 5 % [name] 1 patch onto the skin daily. Remove & Discard patch within 12 hours or as directed by MD   metoprolol succinate (TOPROL-XL) 25 MG 24 hr tablet Take 0.5 tablets (12.5 mg total) by mouth daily.   Multiple Vitamins-Minerals (CEROVITE SENIOR PO) Take 1 tablet by mouth daily.   pantoprazole (PROTONIX) 40 MG tablet TAKE 1 TABLET BY MOUTH TWICE DAILY   rosuvastatin (CRESTOR) 10 MG tablet Take 1 tablet (10 mg total) by mouth daily.   saccharomyces boulardii (FLORASTOR) 250 MG capsule Take 250 mg by mouth daily.   sertraline  (ZOLOFT) 50 MG tablet Take 1 tablet (50 mg total) by mouth daily.   STOOL SOFTENER 100 MG capsule Take 100 mg by mouth daily.   [DISCONTINUED] glipiZIDE-metformin (METAGLIP) 5-500 MG tablet TAKE 1 TABLET BY MOUTH TWICE DAILY (Patient taking differently: Take 2 tablets by mouth 2 (two) times daily before a meal.)   No facility-administered encounter medications on file as of 08/11/2022.    Review of Systems  All other systems reviewed and are negative.   Immunization History  Administered Date(s) Administered   Influenza Split 05/25/2013, 07/12/2019   Influenza, High Dose Seasonal PF 04/06/2018  Influenza,inj,Quad PF,6+ Mos 05/28/2015   Influenza-Unspecified 05/28/2015, 05/27/2016   PPD Test 05/18/2018   Pneumococcal Conjugate-13 01/30/2014   Pneumococcal Polysaccharide-23 08/19/2017   Tdap 11/23/2008   Unspecified SARS-COV-2 Vaccination 07/29/2019, 08/19/2019, 05/17/2020   Pertinent  Health Maintenance Due  Topic Date Due   FOOT EXAM  06/25/2016   OPHTHALMOLOGY EXAM  07/03/2018   INFLUENZA VACCINE  02/04/2022   HEMOGLOBIN A1C  01/26/2023      10/27/2020   10:06 AM 01/05/2021    9:38 AM 03/09/2021   10:41 AM 05/08/2021    9:31 AM 04/27/2022   10:05 AM  Fall Risk  (RETIRED) Patient Fall Risk Level Low fall risk Moderate fall risk Moderate fall risk High fall risk Moderate fall risk   Functional Status Survey:    Vitals:   08/11/22 1342 08/11/22 1515  BP: (!) 151/66 124/65  Pulse: 92   Resp: (!) 22   Temp: 97.7 F (36.5 C)   SpO2: 97%   Weight: 174 lb 3.2 oz (79 kg)   Height: '5\' 8"'$  (1.727 m)    Body mass index is 26.49 kg/m. Physical Exam Vitals reviewed.  Constitutional:      Comments: Sitting in wheelchair  Cardiovascular:     Pulses: Normal pulses.  Pulmonary:     Effort: Pulmonary effort is normal.  Skin:    General: Skin is warm and dry.  Neurological:     Mental Status: He is alert.  Psychiatric:        Mood and Affect: Mood normal.        Behavior:  Behavior normal.        Thought Content: Thought content normal.        Judgment: Judgment normal.     Labs reviewed: Recent Labs    07/31/22 0517 08/01/22 0412 08/02/22 0511 08/03/22 0608 08/04/22 0455  NA 134*   < > 134* 133* 137  K 3.3*   < > 3.7 4.0 4.1  CL 100   < > 99 102 101  CO2 25   < > '24 25 23  '$ GLUCOSE 162*   < > 148* 169* 167*  BUN 20   < > '20 23 22  '$ CREATININE 1.33*   < > 1.22 1.34* 1.37*  CALCIUM 8.3*   < > 8.5* 8.5* 8.5*  MG 2.0  --   --   --  2.4   < > = values in this interval not displayed.   Recent Labs    04/27/22 1008  AST 27  ALT 6  ALKPHOS 48  BILITOT 0.7  PROT 6.4*  ALBUMIN 3.4*   Recent Labs    04/27/22 1008 07/28/22 0433 07/29/22 0501 08/01/22 0412 08/04/22 0455  WBC 6.1   < > 7.8 7.0 5.8  NEUTROABS 3.0  --   --   --   --   HGB 10.1*   < > 9.5* 10.0* 10.3*  HCT 33.3*   < > 30.6* 30.7* 32.8*  MCV 97.1   < > 93.0 89.8 93.2  PLT 167   < > 219 218 235   < > = values in this interval not displayed.   Lab Results  Component Value Date   TSH 1.950 12/10/2017   Lab Results  Component Value Date   HGBA1C 7.7 (H) 07/28/2022   Lab Results  Component Value Date   CHOL 116 12/10/2017   HDL 32 (L) 12/10/2017   LDLCALC 21 12/10/2017   TRIG 317 (H) 12/10/2017   CHOLHDL  3.6 12/10/2017    Significant Diagnostic Results in last 30 days:  ECHOCARDIOGRAM COMPLETE  Result Date: 07/29/2022    ECHOCARDIOGRAM REPORT   Patient Name:   Timothy [name] Date of Exam: 07/29/2022 Medical Rec #:  [id number]    Height:       68.0 in Accession #:    [id number]   Weight:       190.0 lb Date of Birth:  [birth year]   BSA:          2.000 m Patient Age:    44 years     BP:           95/52 mmHg Patient Gender: M            HR:           64 bpm. Exam Location:  [facility] Procedure: 2D Echo, Color Doppler and Cardiac Doppler Indications:     CHF-acute systolic D62.22  History:         Patient has prior history of Echocardiogram examinations, most                  recent  08/07/2020. Risk Factors:Hypertension and Diabetes.                  Persistent Afib.  Sonographer:     Sherrie Sport Referring Phys:  [id number] JJHERDEY AGBATA Diagnosing Phys: Kathlyn Sacramento MD  Sonographer Comments: Suboptimal apical window and no subcostal window. IMPRESSIONS  1. Left ventricular ejection fraction, by estimation, is 60 to 65%. The left ventricle has normal function. The left ventricle has no regional wall motion abnormalities. There is moderate left ventricular hypertrophy. Left ventricular diastolic parameters are indeterminate.  2. Right ventricular systolic function is normal. The right ventricular size is normal. Tricuspid regurgitation signal is inadequate for assessing PA pressure.  3. The mitral valve is normal in structure. Moderate mitral valve regurgitation. No evidence of mitral stenosis.  4. The aortic valve is calcified. There is severe calcifcation of the aortic valve. There is severe thickening of the aortic valve. Aortic valve regurgitation is mild. Severe aortic valve stenosis. Aortic regurgitation PHT measures 477 msec. Aortic valve area, by VTI measures 0.43 cm. Aortic valve mean gradient measures 29.0 mmHg. FINDINGS  Left Ventricle: Left ventricular ejection fraction, by estimation, is 60 to 65%. The left ventricle has normal function. The left ventricle has no regional wall motion abnormalities. The left ventricular internal cavity size was normal in size. There is  moderate left ventricular hypertrophy. Left ventricular diastolic parameters are indeterminate. Right Ventricle: The right ventricular size is normal. No increase in right ventricular wall thickness. Right ventricular systolic function is normal. Tricuspid regurgitation signal is inadequate for assessing PA pressure. Left Atrium: Left atrial size was normal in size. Right Atrium: Right atrial size was normal in size. Pericardium: There is no evidence of pericardial effusion. Mitral Valve: The mitral valve is normal in  structure. Moderate mitral valve regurgitation. No evidence of mitral valve stenosis. Tricuspid Valve: The tricuspid valve is normal in structure. Tricuspid valve regurgitation is not demonstrated. No evidence of tricuspid stenosis. Aortic Valve: The aortic valve is calcified. There is severe calcifcation of the aortic valve. There is severe thickening of the aortic valve. Aortic valve regurgitation is mild. Aortic regurgitation PHT measures 477 msec. Severe aortic stenosis is present. Aortic valve mean gradient measures 29.0 mmHg. Aortic valve peak gradient measures 37.8 mmHg. Aortic valve area, by VTI measures 0.43 cm. Pulmonic Valve: The pulmonic valve was  normal in structure. Pulmonic valve regurgitation is not visualized. No evidence of pulmonic stenosis. Aorta: The aortic root is normal in size and structure. Venous: The inferior vena cava was not well visualized. IAS/Shunts: No atrial level shunt detected by color flow Doppler.  LEFT VENTRICLE PLAX 2D LVIDd:         3.20 cm   Diastology LVIDs:         2.30 cm   LV e' medial:  4.03 cm/s LV PW:         1.30 cm   LV e' lateral: 5.11 cm/s LV IVS:        1.70 cm LVOT diam:     2.00 cm LV SV:         27 LV SV Index:   14 LVOT Area:     3.14 cm  RIGHT VENTRICLE RV Basal diam:  3.10 cm RV Mid diam:    3.10 cm RV S prime:     9.46 cm/s TAPSE (M-mode): 1.5 cm LEFT ATRIUM             Index        RIGHT ATRIUM           Index LA diam:        3.90 cm 1.95 cm/m   RA Area:     11.20 cm LA Vol (A2C):   30.2 ml 15.10 ml/m  RA Volume:   21.20 ml  10.60 ml/m LA Vol (A4C):   23.5 ml 11.75 ml/m LA Biplane Vol: 28.5 ml 14.25 ml/m  AORTIC VALVE AV Area (Vmax):    0.47 cm AV Area (Vmean):   0.48 cm AV Area (VTI):     0.43 cm AV Vmax:           307.60 cm/s AV Vmean:          224.400 cm/s AV VTI:            0.646 m AV Peak Grad:      37.8 mmHg AV Mean Grad:      29.0 mmHg LVOT Vmax:         46.30 cm/s LVOT Vmean:        34.200 cm/s LVOT VTI:          0.088 m LVOT/AV VTI  ratio: 0.14 AI PHT:            477 msec  AORTA Ao Root diam: 2.95 cm TRICUSPID VALVE TR Peak grad:   10.9 mmHg TR Vmax:        165.00 cm/s  SHUNTS Systemic VTI:  0.09 m Systemic Diam: 2.00 cm Kathlyn Sacramento MD Electronically signed by Kathlyn Sacramento MD Signature Date/Time: 07/29/2022/4:10:42 PM    Final    DG Ribs Unilateral Right  Result Date: 07/29/2022 CLINICAL DATA:  Right rib pain. EXAM: RIGHT RIBS - 2 VIEW COMPARISON:  Chest radiographs 07/28/2022 and 04/27/2022. CT chest 04/27/2022. FINDINGS: AP and oblique views of the right ribs are submitted, without an accompanying chest radiograph. Old fractures of the right 7th and 8th ribs anterolaterally are stable. No evidence of acute fracture or focal rib lesion. There is right lung atelectasis without pneumothorax or significant pleural effusion. Overall right lung aeration appears improved from recent chest radiographs. IMPRESSION: No evidence of acute right rib fracture or focal rib lesion. Old right rib fractures. Electronically Signed   By: Richardean Sale M.D.   On: 07/29/2022 12:27   DG Chest Port 1 View  Result Date: 07/28/2022 CLINICAL  DATA:  89 year old male with history of cough and midsternal chest pain. EXAM: PORTABLE CHEST 1 VIEW COMPARISON:  Chest x-ray 04/27/2022. FINDINGS: Blunting of the costophrenic sulci bilaterally, indicative of small bilateral pleural effusions. Opacity at the right base which likely reflects mild subsegmental atelectasis medially. Upper mediastinal contours are within normal limits. Atherosclerotic calcifications are noted in the thoracic aorta. Cephalization of the pulmonary vasculature with indistinct interstitial markings, concerning for a background of pulmonary edema. IMPRESSION: 1. The appearance the chest suggests mild congestive heart failure, as above. 2. Aortic atherosclerosis. Electronically Signed   By: Vinnie Langton M.D.   On: 07/28/2022 05:07    Assessment/Plan 1. Type 2 diabetes mellitus with  hyperlipidemia [facility]) Patient has had glucose levels 250-350 since admission to [facility]. Will plan to increase glipizide-metformin dose to 5-500 mg two tablets twice daily. No indication to start insulin at this time, however, will continue to monitor for need of additional changes.   Family/ staff Communication: nursing  Labs/tests ordered:  none

## 2022-09-08 LAB — BASIC METABOLIC PANEL
BUN: 23 — AB (ref 4–21)
CO2: 26 — AB (ref 13–22)
Chloride: 100 (ref 99–108)
Creatinine: 1.3 (ref 0.6–1.3)
Glucose: 212
Potassium: 4.2 mEq/L (ref 3.5–5.1)
Sodium: 137 (ref 137–147)

## 2022-09-08 LAB — COMPREHENSIVE METABOLIC PANEL
Calcium: 9.6 (ref 8.7–10.7)
eGFR: 53

## 2022-09-09 ENCOUNTER — Ambulatory Visit (INDEPENDENT_AMBULATORY_CARE_PROVIDER_SITE_OTHER): Payer: Medicare Other | Admitting: Dermatology

## 2022-09-09 VITALS — BP 120/60 | HR 78

## 2022-09-09 DIAGNOSIS — Z5111 Encounter for antineoplastic chemotherapy: Secondary | ICD-10-CM | POA: Diagnosis not present

## 2022-09-09 DIAGNOSIS — L570 Actinic keratosis: Secondary | ICD-10-CM

## 2022-09-09 DIAGNOSIS — Z85828 Personal history of other malignant neoplasm of skin: Secondary | ICD-10-CM | POA: Diagnosis not present

## 2022-09-09 DIAGNOSIS — Z79899 Other long term (current) drug therapy: Secondary | ICD-10-CM

## 2022-09-09 DIAGNOSIS — L578 Other skin changes due to chronic exposure to nonionizing radiation: Secondary | ICD-10-CM | POA: Diagnosis not present

## 2022-09-09 DIAGNOSIS — Z7189 Other specified counseling: Secondary | ICD-10-CM

## 2022-09-09 NOTE — Patient Instructions (Addendum)
Cryotherapy Aftercare  Wash gently with soap and water everyday.   Apply Vaseline and Band-Aid daily until healed.   Once scalp healed from cryotherapy treatment, start 5-fluorouracil/calcipotriene cream twice a day for 10 days to affected areas including scalp. Prescription sent to [facility]. Patient advised they will receive an email to purchase the medication online and have it sent to their home. Patient provided with handout reviewing treatment course and side effects and advised to call or message [place] on MyChart with any concerns.  Reviewed course of treatment and expected reaction.  Patient advised to expect inflammation and crusting and advised that erosions are possible.  Patient advised to be diligent with sun protection during and after treatment. Counseled to keep medication out of reach of children and pets.   5-Fluorouracil/Calcipotriene Patient Education   Actinic keratoses are the dry, red scaly spots on the skin caused by sun damage. A portion of these spots can turn into skin cancer with time, and treating them can help prevent development of skin cancer.   Treatment of these spots requires removal of the defective skin cells. There are various ways to remove actinic keratoses, including freezing with liquid nitrogen, treatment with creams, or treatment with a blue light procedure in the office.   5-fluorouracil cream is a topical cream used to treat actinic keratoses. It works by interfering with the growth of abnormal fast-growing skin cells, such as actinic keratoses. These cells peel off and are replaced by healthy ones.   5-fluorouracil/calcipotriene is a combination of the 5-fluorouracil cream with a vitamin D analog cream called calcipotriene. The calcipotriene alone does not treat actinic keratoses. However, when it is combined with 5-fluorouracil, it helps the 5-fluorouracil treat the actinic keratoses much faster so that the same results can be achieved with a much  shorter treatment time.  INSTRUCTIONS FOR 5-FLUOROURACIL/CALCIPOTRIENE CREAM:   5-fluorouracil/calcipotriene cream typically only needs to be used for 4-7 days. A thin layer should be applied twice a day to the treatment areas recommended by your physician.   If your physician prescribed you separate tubes of 5-fluourouracil and calcipotriene, apply a thin layer of 5-fluorouracil followed by a thin layer of calcipotriene.   Avoid contact with your eyes, nostrils, and mouth. Do not use 5-fluorouracil/calcipotriene cream on infected or open wounds.   You will develop redness, irritation and some crusting at areas where you have pre-cancer damage/actinic keratoses. IF YOU DEVELOP PAIN, BLEEDING, OR SIGNIFICANT CRUSTING, STOP THE TREATMENT EARLY - you have already gotten a good response and the actinic keratoses should clear up well.  Wash your hands after applying 5-fluorouracil 5% cream on your skin.   A moisturizer or sunscreen with a minimum SPF 30 should be applied each morning.   Once you have finished the treatment, you can apply a thin layer of Vaseline twice a day to irritated areas to soothe and calm the areas more quickly. If you experience significant discomfort, contact your physician.  For some patients it is necessary to repeat the treatment for best results.  SIDE EFFECTS: When using 5-fluorouracil/calcipotriene cream, you may have mild irritation, such as redness, dryness, swelling, or a mild burning sensation. This usually resolves within 2 weeks. The more actinic keratoses you have, the more redness and inflammation you can expect during treatment. Eye irritation has been reported rarely. If this occurs, please let us know.  If you have any trouble using this cream, please call the office. If you have any other questions about this information, please  do not hesitate to ask me before you leave the office.  Due to recent changes in healthcare laws, you may see results of your  pathology and/or laboratory studies on MyChart before the doctors have had a chance to review them. We understand that in some cases there may be results that are confusing or concerning to you. Please understand that not all results are received at the same time and often the doctors may need to interpret multiple results in order to provide you with the best plan of care or course of treatment. Therefore, we ask that you please give [place] 2 business days to thoroughly review all your results before contacting the office for clarification. Should we see a critical lab result, you will be contacted sooner.   If You Need Anything After Your Visit  If you have any questions or concerns for your doctor, please call our main line at [phone] and press option 4 to reach your doctor's medical assistant. If no one answers, please leave a voicemail as directed and we will return your call as soon as possible. Messages left after 4 pm will be answered the following business day.   You may also send [place] a message via [place]. We typically respond to MyChart messages within 1-2 business days.  For prescription refills, please ask your pharmacy to contact our office. Our fax number is [phone].  If you have an urgent issue when the clinic is closed that cannot wait until the next business day, you can page your doctor at the number below.    Please note that while we do our best to be available for urgent issues outside of office hours, we are not available 24/7.   If you have an urgent issue and are unable to reach [place], you may choose to seek medical care at your doctor's office, retail clinic, urgent care center, or emergency room.  If you have a medical emergency, please immediately call 911 or go to the emergency department.  Pager Numbers  - Dr. Nehemiah Massed: [phone]  - Dr. Laurence Ferrari: [phone]  - Dr. Nicole Kindred: [phone]  In the event of inclement weather, please call our main line at  [phone] for an update on the status of any delays or closures.  Dermatology Medication Tips: Please keep the boxes that topical medications come in in order to help keep track of the instructions about where and how to use these. Pharmacies typically print the medication instructions only on the boxes and not directly on the medication tubes.   If your medication is too expensive, please contact our office at [phone] option 4 or send [place] a message through [place].   We are unable to tell what your co-pay for medications will be in advance as this is different depending on your insurance coverage. However, we may be able to find a substitute medication at lower cost or fill out paperwork to get insurance to cover a needed medication.   If a prior authorization is required to get your medication covered by your insurance company, please allow [place] 1-2 business days to complete this process.  Drug prices often vary depending on where the prescription is filled and some pharmacies may offer cheaper prices.  The website www.goodrx.com contains coupons for medications through different pharmacies. The prices here do not account for what the cost may be with help from insurance (it may be cheaper with your insurance), but the website can give you the price if you did not use any  insurance.  - You can print the associated coupon and take it with your prescription to the pharmacy.  - You may also stop by our office during regular business hours and pick up a GoodRx coupon card.  - If you need your prescription sent electronically to a different pharmacy, notify our office through [facility] or by phone at [phone] option 4.     Si Usted Necesita Algo Despus de Su Visita  Tambin puede enviarnos un mensaje a travs de Pharmacist, community. Por lo general respondemos a los mensajes de MyChart en el transcurso de 1 a 2 das hbiles.  Para renovar recetas, por favor pida a su farmacia que se  ponga en contacto con nuestra oficina. Harland Dingwall de fax es [place] [phone].  Si tiene un asunto urgente cuando la clnica est cerrada y que no puede esperar hasta el siguiente da hbil, puede llamar/localizar a su doctor(a) al nmero que aparece a continuacin.   Por favor, tenga en cuenta que aunque hacemos todo lo posible para estar disponibles para asuntos urgentes fuera del horario de [place], no estamos disponibles las 24 horas del da, los 7 das de la [place].   Si tiene un problema urgente y no puede comunicarse con nosotros, puede optar por buscar atencin mdica  en el consultorio de su doctor(a), en una clnica privada, en un centro de atencin urgente o en una sala de emergencias.  Si tiene Engineering geologist, por favor llame inmediatamente al 911 o vaya a la sala de emergencias.  Nmeros de bper  - Dr. Nehemiah Massed: [phone]  - Dra. Moye: [phone]  - Dra. Nicole Kindred: [phone]  En caso de inclemencias del [place], por favor llame a Johnsie Kindred principal al [phone] para una actualizacin sobre el [place] de cualquier retraso o cierre.  Consejos para la medicacin en dermatologa: Por favor, guarde las cajas en las que vienen los medicamentos de uso tpico para ayudarle a seguir las instrucciones sobre dnde y cmo usarlos. Las farmacias generalmente imprimen las instrucciones del medicamento slo en las cajas y no directamente en los tubos del [place].   Si su medicamento es muy caro, por favor, pngase en contacto con Zigmund Daniel llamando al [phone] y presione la opcin 4 o envenos un mensaje a travs de Pharmacist, community.   No podemos decirle cul ser su copago por los medicamentos por adelantado ya que esto es diferente dependiendo de la cobertura de su seguro. Sin embargo, es posible que podamos encontrar un medicamento sustituto a Electrical engineer un formulario para que el seguro cubra el medicamento que se considera necesario.   Si se requiere  una autorizacin previa para que su compaa de seguros [place] su medicamento, por favor permtanos de 1 a 2 das hbiles para completar este proceso.  Los precios de los medicamentos varan con frecuencia dependiendo del Environmental consultant de dnde se surte la receta y alguna farmacias pueden ofrecer precios ms baratos.  El sitio web www.goodrx.com tiene cupones para medicamentos de Airline pilot. Los precios aqu no tienen en cuenta lo que podra costar con la ayuda del seguro (puede ser ms barato con su seguro), pero el sitio web puede darle el precio si no utiliz Research scientist (physical sciences).  - Puede imprimir el cupn correspondiente y llevarlo con su receta a la farmacia.  - Tambin puede pasar por nuestra oficina durante el horario de atencin regular y Charity fundraiser una tarjeta de cupones de GoodRx.  - Si necesita que su receta se enve electrnicamente a Ardelia Mems  farmacia diferente, informe a nuestra oficina a travs de MyChart de [place] o por telfono llamando al [phone] y presione la opcin 4.

## 2022-09-09 NOTE — Progress Notes (Unsigned)
Follow-Up Visit   Subjective  Timothy [name] is a 89 y.o. male who presents for the following: Follow-up.  Patient presents for 6 month follow-up Aks. He has a history of BCC/SCC. BCC of the L mid ear helix treated with Mohs surgery 03/26/2022. He has a spot on his scalp that is bothersome.  Patient accompanied by daughter, who contributes to history.   The following portions of the chart were reviewed this encounter and updated as appropriate:       Review of Systems:  No other skin or systemic complaints except as noted in HPI or Assessment and Plan.  Objective  Well appearing patient in no apparent distress; mood and affect are within normal limits.  A focused examination was performed including face, scalp. Relevant physical exam findings are noted in the Assessment and Plan.  L mid ear helix Well healed scar with no evidence of recurrence.   Mid crown x 2, L nasal dorsum x 1, L preauricular x 2, L lower cheek at scar x 2, R preauricular adjacent to scar x 1 (8) Pink scaly macules.     Assessment & Plan  Actinic Damage with PreCancerous Actinic Keratoses Counseling for Topical Chemotherapy Management: Patient exhibits: - Severe, confluent actinic changes with pre-cancerous actinic keratoses that is secondary to cumulative UV radiation exposure over time - Condition that is severe; chronic, not at goal. - diffuse scaly erythematous macules and papules with underlying dyspigmentation - Discussed Prescription "Field Treatment" topical Chemotherapy for Severe, Chronic Confluent Actinic Changes with Pre-Cancerous Actinic Keratoses Field treatment involves treatment of an entire area of skin that has confluent Actinic Changes (Sun/ Ultraviolet light damage) and PreCancerous Actinic Keratoses by method of PhotoDynamic Therapy (PDT) and/or prescription Topical Chemotherapy agents such as 5-fluorouracil, 5-fluorouracil/calcipotriene, and/or imiquimod.  The purpose is to decrease the  number of clinically evident and subclinical PreCancerous lesions to prevent progression to development of skin cancer by chemically destroying early precancer changes that may or may not be visible.  It has been shown to reduce the risk of developing skin cancer in the treated area. As a result of treatment, redness, scaling, crusting, and open sores may occur during treatment course. One or more than one of these methods may be used and may have to be used several times to control, suppress and eliminate the PreCancerous changes. Discussed treatment course, expected reaction, and possible side effects. - Recommend daily broad spectrum sunscreen SPF 30+ to sun-exposed areas, reapply every 2 hours as needed.  - Staying in the shade or wearing long sleeves, sun glasses (UVA+UVB protection) and wide brim hats (4-inch brim around the entire circumference of the hat) are also recommended. - Call for new or changing lesions. - Start 5-fluorouracil/calcipotriene cream twice a day for 10 days to affected areas including scalp. Prescription sent to [facility], 15g 1Rf. Patient advised they will receive an email to purchase the medication online and have it sent to their home. Patient provided with handout reviewing treatment course and side effects and advised to call or message [place] on MyChart with any concerns.  Reviewed course of treatment and expected reaction.  Patient advised to expect inflammation and crusting and advised that erosions are possible.  Patient advised to be diligent with sun protection during and after treatment. Counseled to keep medication out of reach of children and pets.   History of basal cell carcinoma (BCC) L mid ear helix  Mohs surgery 03/26/2022. Clear. Observe for recurrence. Call clinic for new or  changing lesions.  Recommend regular skin exams, daily broad-spectrum spf 30+ sunscreen use, and photoprotection.    AK (actinic keratosis) (8) Mid crown x 2, L nasal dorsum x 1, L  preauricular x 2, L lower cheek at scar x 2, R preauricular adjacent to scar x 1  Actinic keratoses are precancerous spots that appear secondary to cumulative UV radiation exposure/sun exposure over time. They are chronic with expected duration over 1 year. A portion of actinic keratoses will progress to squamous cell carcinoma of the skin. It is not possible to reliably predict which spots will progress to skin cancer and so treatment is recommended to prevent development of skin cancer.  Recommend daily broad spectrum sunscreen SPF 30+ to sun-exposed areas, reapply every 2 hours as needed.  Recommend staying in the shade or wearing long sleeves, sun glasses (UVA+UVB protection) and wide brim hats (4-inch brim around the entire circumference of the hat). Call for new or changing lesions.  Destruction of lesion - Mid crown x 2, L nasal dorsum x 1, L preauricular x 2, L lower cheek at scar x 2, R preauricular adjacent to scar x 1  Destruction method: cryotherapy   Informed consent: discussed and consent obtained   Lesion destroyed using liquid nitrogen: Yes   Region frozen until ice ball extended beyond lesion: Yes   Outcome: patient tolerated procedure well with no complications   Post-procedure details: wound care instructions given   Additional details:  Prior to procedure, discussed risks of blister formation, small wound, skin dyspigmentation, or rare scar following cryotherapy. Recommend Vaseline ointment to treated areas while healing.    Return in about 6 months (around 03/12/2023) for UBSE, Hx BCC, AKs.  IJamesetta Orleans, CMA, am acting as scribe for Brendolyn Patty, MD .  Documentation: I have reviewed the above documentation for accuracy and completeness, and I agree with the above.  Brendolyn Patty MD

## 2022-09-10 ENCOUNTER — Encounter: Payer: Self-pay | Admitting: Student

## 2022-09-10 ENCOUNTER — Non-Acute Institutional Stay (SKILLED_NURSING_FACILITY): Payer: Medicare Other | Admitting: Student

## 2022-09-10 DIAGNOSIS — Z85828 Personal history of other malignant neoplasm of skin: Secondary | ICD-10-CM | POA: Insufficient documentation

## 2022-09-10 DIAGNOSIS — I5032 Chronic diastolic (congestive) heart failure: Secondary | ICD-10-CM

## 2022-09-10 DIAGNOSIS — I480 Paroxysmal atrial fibrillation: Secondary | ICD-10-CM | POA: Diagnosis not present

## 2022-09-10 DIAGNOSIS — I350 Nonrheumatic aortic (valve) stenosis: Secondary | ICD-10-CM | POA: Diagnosis not present

## 2022-09-10 DIAGNOSIS — R251 Tremor, unspecified: Secondary | ICD-10-CM

## 2022-09-10 DIAGNOSIS — I10 Essential (primary) hypertension: Secondary | ICD-10-CM

## 2022-09-10 DIAGNOSIS — I739 Peripheral vascular disease, unspecified: Secondary | ICD-10-CM

## 2022-09-10 DIAGNOSIS — N179 Acute kidney failure, unspecified: Secondary | ICD-10-CM

## 2022-09-10 DIAGNOSIS — G20A1 Parkinson's disease without dyskinesia, without mention of fluctuations: Secondary | ICD-10-CM

## 2022-09-10 NOTE — Progress Notes (Addendum)
Location:  Other Nursing Home Room Number: 115 A Place of Service:  SNF (31) Provider:  Manfred Arch, Doctors Making  Patient Care Team: Housecalls, Doctors Making as PCP - General (Geriatric Medicine) [name], MD as PCP - Cardiology (Cardiology) Byrnett, Forest Gleason, MD as Consulting Physician (General Surgery) [name], MD as Consulting Physician (Cardiology)  Extended Emergency Contact Information Primary Emergency Contact: Dewayne Shorter of [place] Phone: [phone] Mobile Phone: [phone] Relation: Daughter Secondary Emergency Contact: Deckman,Gerald Mobile Phone: [phone] Relation: Son  Code Status:  DNR Goals of care: Advanced Directive information    08/11/2022    1:58 PM  Advanced Directives  Does Patient Have a Medical Advance Directive? Yes  Type of Advance Directive Out of facility DNR (pink MOST or yellow form)  Does patient want to make changes to medical advance directive? No - Patient declined     Chief Complaint  Patient presents with   Chronic Care Management    HPI:  Pt is a 89 y.o. male seen today for medical management of chronic diseases.    Patient with history of diabetes has been receiving daily glucose checks. Some concern that this is too often. Patient would like to restart his CBD supplementation as they help with his PD tremor symptoms. Denies confusion or changes in mental status wit the medication. Family to supply.  He has no concerns today except he wants his wife moved closer to him  if possible.   Called daughter and HCPOA to discuss current status. Would like A1c evaluated before discontinuing glucose checks.    Past Medical History:  Diagnosis Date   (HFpEF) heart failure with preserved ejection fraction ([place])    a. 05/2019 Echo: EF 65-70%, mod LVH. Gr2 DD. Mod AS; b. 08/2020 Echo: EF 65-70%, mod LVH, GrI DD, nl RV fxn, severe AS.   AAA (abdominal aortic aneurysm) ([place])    a.  2014 Abd U/S: 3.5cm AAA; b. 10/20189 Abd U/S: 3.3cm AAA; c. 01/2018 CT Abd/Pelvis: 2.7cm infrarenal AAA - rec f/u U/S in 5 yrs.   Actinic keratosis    Back pain    Basal cell carcinoma 08/22/2019   Left mid ear helix. Nodular pattern, not treated pt was sched for [facility] 09/12/19 and canceled notes in [place], re bx on 03/11/22 bx proven BCC, Mohs 03/26/2022   Basal cell carcinoma 04/30/2020   Left cheek. Superficial and nodular patterns. excised   Coronary artery calcification seen on CT scan    Esophageal reflux    Essential hypertension    History of stress test    a. 01/2012 MV: EF 63%, no rwma. No ischemia/infarct. Low risk.   Hypokalemia 08/01/2022   Mixed hyperlipidemia    Osteitis deformans without mention of bone tumor    Paget's Disease   PAD (peripheral artery disease) ([place])    a. 12/2012 and 05/2013 s/p bilat LE bypass @ [facility] in setting of bilat popliteal aneurysms; b. 05/2019 LE Duplex: R PT 50-74%, Nl L Leg.   Parkinson's disease    Persistent atrial fibrillation ([place])    a. 02/2015 Event monitor: long periods of Afib--prev on flecainide; b. CHA2DS2VASc = 8-->Eliquis 5 bid.   Retinal detachment    Senile osteoporosis    Severe aortic stenosis    a. 05/2019 Echo: Mod AS. Mean grad 29.5 mmHg. Peak grad 55.7 mmHg. AVA 1.28 cm^2 (VTI); b. 08/2020 Echo: EF 65-70%, mod LVH, GrI DD, nl RV fxn, triv MR, mild-mod AI. Sev AS (  AVA 0.87cm^2, mean grad 40, AoV Vmax 4.82ms). Mild dil of Ao root.   Skin cancer    Squamous cell carcinoma of skin 04/02/2017   Left cheek. SCCis. Referred for Moh's, clear at appt 06/18/17. Pt opted to treat with 5FU over Mohs.   Swelling of right lower extremity    a. 05/2019 LE U/S: No DVT. 50-64% R PT artery stenosis.   TIA (transient ischemic attack)    Type II diabetes mellitus ([place]    Past Surgical History:  Procedure Laterality Date   APPENDECTOMY     BACK SURGERY     CATARACT EXTRACTION     COLONOSCOPY  2006,2009   ESOPHAGEAL DILATION      GALLBLADDER SURGERY     HERNIA REPAIR     Lower extremity arterial bypass Bilateral    RETINAL DETACHMENT SURGERY     TONSILLECTOMY      No Known Allergies  Outpatient Encounter Medications as of 09/10/2022  Medication Sig   acetaminophen (TYLENOL) 325 MG tablet Take 2 tablets (650 mg total) by mouth every 6 (six) hours as needed for mild pain or headache (fever >/= 101).   apixaban (ELIQUIS) 5 MG TABS tablet Take 1 tablet (5 mg total) by mouth 2 (two) times daily.   Calcium Carb-Cholecalciferol (CALCIUM PLUS VITAMIN D) 500-5 MG-MCG TABS Take 1 tablet by mouth daily.   cyanocobalamin (,VITAMIN B-12,) 1000 MCG/ML injection Inject 0.5 mLs into the muscle every 30 (thirty) days.   diclofenac Sodium (VOLTAREN) 1 % GEL Apply 4 g topically 4 (four) times daily as needed (upper back or rib cage pain).   famotidine (PEPCID) 20 MG tablet Take 20 mg by mouth at bedtime.   FARXIGA 10 MG TABS tablet TAKE 1 TABLET BY MOUTH DAILY   furosemide (LASIX) 20 MG tablet Take 1 tablet (20 mg total) by mouth daily. Take additional 20 mg AS NEEDED with supper if progressive weight gain or worsening dyspnea   glipiZIDE-metformin (METAGLIP) 5-500 MG tablet Take 2 tablets by mouth 2 (two) times daily before a meal.   GLUCOSAMINE-CHONDROITIN PO Take 1 tablet by mouth daily.   hydrocortisone 2.5 % cream Apply 1-2 times daily to areas at neck, face, scalp as needed for itch.   JANUVIA 100 MG tablet TAKE 1 TABLET BY MOUTH DAILY   ketoconazole (NIZORAL) 2 % cream once daily to pink scaly patches at face, ears and scalp.   lidocaine (LIDODERM) 5 % Place 1 patch onto the skin daily. Remove & Discard patch within 12 hours or as directed by MD   metoprolol succinate (TOPROL-XL) 25 MG 24 hr tablet Take 0.5 tablets (12.5 mg total) by mouth daily.   Multiple Vitamins-Minerals (CEROVITE SENIOR PO) Take 1 tablet by mouth daily.   pantoprazole (PROTONIX) 40 MG tablet TAKE 1 TABLET BY MOUTH TWICE DAILY   rosuvastatin (CRESTOR) 10 MG  tablet Take 1 tablet (10 mg total) by mouth daily.   saccharomyces boulardii (FLORASTOR) 250 MG capsule Take 250 mg by mouth daily.   sertraline (ZOLOFT) 50 MG tablet Take 1 tablet (50 mg total) by mouth daily.   STOOL SOFTENER 100 MG capsule Take 100 mg by mouth daily.   No facility-administered encounter medications on file as of 09/10/2022.    Review of Systems  Immunization History  Administered Date(s) Administered   Influenza Split 05/25/2013, 07/12/2019   Influenza, High Dose Seasonal PF 04/06/2018   Influenza,inj,Quad PF,6+ Mos 05/28/2015   Influenza-Unspecified 05/28/2015, 05/27/2016   PPD Test 05/18/2018  Pneumococcal Conjugate-13 01/30/2014   Pneumococcal Polysaccharide-23 08/19/2017   Tdap 11/23/2008   Unspecified SARS-COV-2 Vaccination 07/29/2019, 08/19/2019, 05/17/2020   Pertinent  Health Maintenance Due  Topic Date Due   FOOT EXAM  06/25/2016   OPHTHALMOLOGY EXAM  07/03/2018   INFLUENZA VACCINE  02/04/2022   HEMOGLOBIN A1C  01/26/2023      10/27/2020   10:06 AM 01/05/2021    9:38 AM 03/09/2021   10:41 AM 05/08/2021    9:31 AM 04/27/2022   10:05 AM  Fall Risk  (RETIRED) Patient Fall Risk Level Low fall risk Moderate fall risk Moderate fall risk High fall risk Moderate fall risk   Functional Status Survey:    Vitals:   09/10/22 0843  BP: 125/67  Pulse: 78  Resp: (!) 22  Temp: 97.8 F (36.6 C)  SpO2: 99%  Weight: 174 lb (78.9 kg)   Body mass index is 26.46 kg/m. Physical Exam Constitutional:      Comments: Sitting in wheelchair  Cardiovascular:     Rate and Rhythm: Normal rate.     Pulses: Normal pulses.  Pulmonary:     Effort: Pulmonary effort is normal.  Skin:    General: Skin is warm and dry.  Neurological:     Mental Status: He is alert and oriented to person, place, and time.     Labs reviewed: Recent Labs    07/31/22 0517 08/01/22 0412 08/02/22 0511 08/03/22 0608 08/04/22 0455  NA 134*   < > 134* 133* 137  K 3.3*   < > 3.7 4.0  4.1  CL 100   < > 99 102 101  CO2 25   < > 24 25 23   GLUCOSE 162*   < > 148* 169* 167*  BUN 20   < > 20 23 22   CREATININE 1.33*   < > 1.22 1.34* 1.37*  CALCIUM 8.3*   < > 8.5* 8.5* 8.5*  MG 2.0  --   --   --  2.4   < > = values in this interval not displayed.   Recent Labs    04/27/22 1008  AST 27  ALT 6  ALKPHOS 48  BILITOT 0.7  PROT 6.4*  ALBUMIN 3.4*   Recent Labs    04/27/22 1008 07/28/22 0433 07/29/22 0501 08/01/22 0412 08/04/22 0455  WBC 6.1   < > 7.8 7.0 5.8  NEUTROABS 3.0  --   --   --   --   HGB 10.1*   < > 9.5* 10.0* 10.3*  HCT 33.3*   < > 30.6* 30.7* 32.8*  MCV 97.1   < > 93.0 89.8 93.2  PLT 167   < > 219 218 235   < > = values in this interval not displayed.   Lab Results  Component Value Date   TSH 1.950 12/10/2017   Lab Results  Component Value Date   HGBA1C 7.7 (H) 07/28/2022   Lab Results  Component Value Date   CHOL 116 12/10/2017   HDL 32 (L) 12/10/2017   LDLCALC 21 12/10/2017   TRIG 317 (H) 12/10/2017   CHOLHDL 3.6 12/10/2017    Significant Diagnostic Results in last 30 days:  No results found.  Assessment/Plan Parkinson's disease, unspecified whether dyskinesia present, unspecified whether manifestations fluctuate  Chronic heart failure with preserved ejection fraction (HCC)  Paroxysmal atrial fibrillation (H[facility])  Symptomatic severe aortic stenosis with normal ejection fraction  PAD (peripheral artery disease) ([place])  Essential hypertension  AKI (acute kidney injury) ([place]) Plan  to restart patient's CBD supplemenation due to aid in abating tremors from PD. Discussed DM which was well-controlled on 1/26 at 7.7. He has had hyperglycemia since arrival here. Will plan for short interval A1c. Decrease frequency of glucose checks to AM only. Continue protonix. Continue crestor for cholesterol. Continue sertraline for mood. Continue Farxiga 10 mg  daily. Continue Glipizide-metformin 04-999 mg BID. Encourage dietary adherence. Goal is  less than 8. ill adjust Eliquis dosing based on cr on BMP if >1.10 will plan to decrease dose to 2.5 mg BID. Continue vitamin D supplmentation. Continue daily lasix. Continue metoprolol.    Family/ staff Communication: Daughter, HCPOA, nursing  Labs/tests ordered:  BMP, A1c

## 2022-09-11 LAB — BASIC METABOLIC PANEL
BUN: 23 — AB (ref 4–21)
CO2: 27 — AB (ref 13–22)
Chloride: 102 (ref 99–108)
Creatinine: 1.3 (ref 0.6–1.3)
Glucose: 118
Potassium: 3.8 mEq/L (ref 3.5–5.1)
Sodium: 137 (ref 137–147)

## 2022-09-11 LAB — COMPREHENSIVE METABOLIC PANEL
Calcium: 8.7 (ref 8.7–10.7)
eGFR: 52

## 2022-09-11 LAB — HEMOGLOBIN A1C: Hemoglobin A1C: 8

## 2022-10-15 LAB — HM DIABETES EYE EXAM

## 2022-10-22 ENCOUNTER — Non-Acute Institutional Stay (SKILLED_NURSING_FACILITY): Payer: Medicare Other | Admitting: Student

## 2022-10-22 ENCOUNTER — Encounter: Payer: Self-pay | Admitting: Student

## 2022-10-22 DIAGNOSIS — E1169 Type 2 diabetes mellitus with other specified complication: Secondary | ICD-10-CM

## 2022-10-22 DIAGNOSIS — I5032 Chronic diastolic (congestive) heart failure: Secondary | ICD-10-CM

## 2022-10-22 DIAGNOSIS — G20A1 Parkinson's disease without dyskinesia, without mention of fluctuations: Secondary | ICD-10-CM

## 2022-10-22 DIAGNOSIS — R1310 Dysphagia, unspecified: Secondary | ICD-10-CM

## 2022-10-22 DIAGNOSIS — I10 Essential (primary) hypertension: Secondary | ICD-10-CM | POA: Diagnosis not present

## 2022-10-22 DIAGNOSIS — I480 Paroxysmal atrial fibrillation: Secondary | ICD-10-CM

## 2022-10-22 DIAGNOSIS — I739 Peripheral vascular disease, unspecified: Secondary | ICD-10-CM

## 2022-10-22 DIAGNOSIS — E785 Hyperlipidemia, unspecified: Secondary | ICD-10-CM

## 2022-10-22 NOTE — Progress Notes (Unsigned)
Location:  Other [facility] Room Number: [facility] 420A Place of Service:  SNF (31) Provider:  Dr.Trenita Hulme  PCP: Housecalls, Doctors Making  Patient Care Team: Housecalls, Doctors Making as PCP - General (Geriatric Medicine) Antonieta Iba, MD as PCP - Cardiology (Cardiology) Byrnett, Merrily Pew, MD as Consulting Physician (General Surgery) Antonieta Iba, MD as Consulting Physician (Cardiology)  Extended Emergency Contact Information Primary Emergency Contact: Nevin Bloodgood of [place] Home Phone: [phone] Mobile Phone: [phone] Relation: Daughter Secondary Emergency Contact: Spilman,Gerald Mobile Phone: [phone] Relation: Son  Code Status:  DNR Goals of care: Advanced Directive information    10/22/2022    9:31 AM  Advanced Directives  Does Patient Have a Medical Advance Directive? Yes  Type of Advance Directive Out of facility DNR (pink MOST or yellow form)  Does patient want to make changes to medical advance directive? No - Patient declined     Chief Complaint  Patient presents with   Medical Management of Chronic Issues    Medical Management of Chronic Issues.     HPI:  Pt is a 89 y.o. male seen today for medical management of chronic diseases.     Past Medical History:  Diagnosis Date   (HFpEF) heart failure with preserved ejection fraction    a. 05/2019 Echo: EF 65-70%, mod LVH. Gr2 DD. Mod AS; b. 08/2020 Echo: EF 65-70%, mod LVH, GrI DD, nl RV fxn, severe AS.   AAA (abdominal aortic aneurysm)    a. 2014 Abd U/S: 3.5cm AAA; b. 10/20189 Abd U/S: 3.3cm AAA; c. 01/2018 CT Abd/Pelvis: 2.7cm infrarenal AAA - rec f/u U/S in 5 yrs.   Actinic keratosis    Back pain    Basal cell carcinoma 08/22/2019   Left mid ear helix. Nodular pattern, not treated pt was sched for [facility] 09/12/19 and canceled notes in [employer], re bx on 03/11/22 bx proven BCC, Mohs 03/26/2022   Basal cell carcinoma 04/30/2020   Left cheek.  Superficial and nodular patterns. excised   Coronary artery calcification seen on CT scan    Esophageal reflux    Essential hypertension    History of stress test    a. 01/2012 MV: EF 63%, no rwma. No ischemia/infarct. Low risk.   Hypokalemia 08/01/2022   Mixed hyperlipidemia    Osteitis deformans without mention of bone tumor    Paget's Disease   PAD (peripheral artery disease)    a. 12/2012 and 05/2013 s/p bilat LE bypass @ [facility] in setting of bilat popliteal aneurysms; b. 05/2019 LE Duplex: R PT 50-74%, Nl L Leg.   Parkinson's disease    Persistent atrial fibrillation    a. 02/2015 Event monitor: long periods of Afib--prev on flecainide; b. CHA2DS2VASc = 8-->Eliquis 5 bid.   Retinal detachment    Senile osteoporosis    Severe aortic stenosis    a. 05/2019 Echo: Mod AS. Mean grad 29.5 mmHg. Peak grad 55.7 mmHg. AVA 1.28 cm^2 (VTI); b. 08/2020 Echo: EF 65-70%, mod LVH, GrI DD, nl RV fxn, triv MR, mild-mod AI. Sev AS (AVA 0.87cm^2, mean grad 40, AoV Vmax 4.40m/s). Mild dil of Ao root.   Skin cancer    Squamous cell carcinoma of skin 04/02/2017   Left cheek. SCCis. Referred for Moh's, clear at appt 06/18/17. Pt opted to treat with 5FU over Mohs.   Swelling of right lower extremity    a. 05/2019 LE U/S: No DVT. 50-64% R PT artery stenosis.   TIA (transient ischemic attack)  Type II diabetes mellitus    Past Surgical History:  Procedure Laterality Date   APPENDECTOMY     BACK SURGERY     CATARACT EXTRACTION     COLONOSCOPY  2006,2009   ESOPHAGEAL DILATION     GALLBLADDER SURGERY     HERNIA REPAIR     Lower extremity arterial bypass Bilateral    RETINAL DETACHMENT SURGERY     TONSILLECTOMY      No Known Allergies  Outpatient Encounter Medications as of 10/22/2022  Medication Sig   acetaminophen (TYLENOL) 325 MG tablet Take 2 tablets (650 mg total) by mouth every 6 (six) hours as needed for mild pain or headache (fever >/= 101).   apixaban (ELIQUIS) 5 MG TABS tablet Take 1 tablet  (5 mg total) by mouth 2 (two) times daily.   Calcium Carb-Cholecalciferol (CALCIUM PLUS VITAMIN D) 500-5 MG-MCG TABS Take 1 tablet by mouth daily.   cyanocobalamin (,VITAMIN B-12,) 1000 MCG/ML injection Inject 0.5 mLs into the muscle every 30 (thirty) days.   diclofenac Sodium (VOLTAREN) 1 % GEL Apply 4 g topically 4 (four) times daily as needed (upper back or rib cage pain).   famotidine (PEPCID) 20 MG tablet Take 20 mg by mouth at bedtime.   FARXIGA 10 MG TABS tablet TAKE 1 TABLET BY MOUTH DAILY   furosemide (LASIX) 20 MG tablet Take 1 tablet (20 mg total) by mouth daily. Take additional 20 mg AS NEEDED with supper if progressive weight gain or worsening dyspnea   glipiZIDE-metformin (METAGLIP) 5-500 MG tablet Take 2 tablets by mouth 2 (two) times daily before a meal.   GLUCOSAMINE-CHONDROITIN PO Take 1 tablet by mouth daily.   hydrocortisone 2.5 % cream Apply 1-2 times daily to areas at neck, face, scalp as needed for itch.   Infant Care Products (DERMACLOUD EX) Apply to penile area topically every shift   JANUVIA 100 MG tablet TAKE 1 TABLET BY MOUTH DAILY   ketoconazole (NIZORAL) 2 % cream once daily to pink scaly patches at face, ears and scalp.   lidocaine (LIDODERM) 5 % Place 1 patch onto the skin daily. Remove & Discard patch within 12 hours or as directed by MD   metoprolol succinate (TOPROL-XL) 25 MG 24 hr tablet Take 0.5 tablets (12.5 mg total) by mouth daily.   Multiple Vitamins-Minerals (CEROVITE SENIOR PO) Take 1 tablet by mouth daily.   pantoprazole (PROTONIX) 40 MG tablet TAKE 1 TABLET BY MOUTH TWICE DAILY   rosuvastatin (CRESTOR) 10 MG tablet Take 1 tablet (10 mg total) by mouth daily.   saccharomyces boulardii (FLORASTOR) 250 MG capsule Take 250 mg by mouth daily.   sertraline (ZOLOFT) 50 MG tablet Take 1 tablet (50 mg total) by mouth daily.   STOOL SOFTENER 100 MG capsule Take 100 mg by mouth daily.   No facility-administered encounter medications on file as of 10/22/2022.     Review of Systems  Immunization History  Administered Date(s) Administered   Influenza Split 05/25/2013, 07/12/2019   Influenza, High Dose Seasonal PF 04/06/2018   Influenza,inj,Quad PF,6+ Mos 05/28/2015   Influenza-Unspecified 05/28/2015, 05/27/2016   PPD Test 05/18/2018   Pneumococcal Conjugate-13 01/30/2014   Pneumococcal Polysaccharide-23 08/19/2017   Tdap 11/23/2008   Unspecified SARS-COV-2 Vaccination 07/29/2019, 08/19/2019, 05/17/2020   Pertinent  Health Maintenance Due  Topic Date Due   FOOT EXAM  06/25/2016   OPHTHALMOLOGY EXAM  07/03/2018   INFLUENZA VACCINE  02/05/2023   HEMOGLOBIN A1C  03/14/2023      10/27/2020  10:06 AM 01/05/2021    9:38 AM 03/09/2021   10:41 AM 05/08/2021    9:31 AM 04/27/2022   10:05 AM  Fall Risk  (RETIRED) Patient Fall Risk Level Low fall risk Moderate fall risk Moderate fall risk High fall risk Moderate fall risk   Functional Status Survey:    Vitals:   10/22/22 0920  BP: 121/71  Pulse: 78  Resp: 17  Temp: 97.7 F (36.5 C)  SpO2: 97%  Weight: 170 lb 9.6 oz (77.4 kg)  Height: 5\' 8"  (1.727 m)   Body mass index is 25.94 kg/m. Physical Exam  Labs reviewed: Recent Labs    07/31/22 0517 08/01/22 0412 08/02/22 0511 08/03/22 0608 08/04/22 0455 09/08/22 0000 09/11/22 0000  NA 134*   < > 134* 133* 137 137 137  K 3.3*   < > 3.7 4.0 4.1 4.2 3.8  CL 100   < > 99 102 101 100 102  CO2 25   < > 24 25 23  26* 27*  GLUCOSE 162*   < > 148* 169* 167*  --   --   BUN 20   < > 20 23 22  23* 23*  CREATININE 1.33*   < > 1.22 1.34* 1.37* 1.3 1.3  CALCIUM 8.3*   < > 8.5* 8.5* 8.5* 9.6 8.7  MG 2.0  --   --   --  2.4  --   --    < > = values in this interval not displayed.   Recent Labs    04/27/22 1008  AST 27  ALT 6  ALKPHOS 48  BILITOT 0.7  PROT 6.4*  ALBUMIN 3.4*   Recent Labs    04/27/22 1008 07/28/22 0433 07/29/22 0501 08/01/22 0412 08/04/22 0455  WBC 6.1   < > 7.8 7.0 5.8  NEUTROABS 3.0  --   --   --   --   HGB  10.1*   < > 9.5* 10.0* 10.3*  HCT 33.3*   < > 30.6* 30.7* 32.8*  MCV 97.1   < > 93.0 89.8 93.2  PLT 167   < > 219 218 235   < > = values in this interval not displayed.   Lab Results  Component Value Date   TSH 1.950 12/10/2017   Lab Results  Component Value Date   HGBA1C 8.0 09/11/2022   Lab Results  Component Value Date   CHOL 116 12/10/2017   HDL 32 (L) 12/10/2017   LDLCALC 21 12/10/2017   TRIG 317 (H) 12/10/2017   CHOLHDL 3.6 12/10/2017    Significant Diagnostic Results in last 30 days:  No results found.  Assessment/Plan There are no diagnoses linked to this encounter.   Family/ staff Communication: ***  Labs/tests ordered:  ***

## 2022-10-23 MED ORDER — APIXABAN 2.5 MG PO TABS: 2.5000 mg | ORAL_TABLET | Freq: Two times a day (BID) | ORAL | Status: DC

## 2022-10-27 LAB — COMPREHENSIVE METABOLIC PANEL
Calcium: 8.5 — AB (ref 8.7–10.7)
eGFR: 52

## 2022-10-27 LAB — BASIC METABOLIC PANEL
BUN: 19 (ref 4–21)
CO2: 27 — AB (ref 13–22)
Chloride: 102 (ref 99–108)
Creatinine: 1.3 (ref 0.6–1.3)
Glucose: 115
Potassium: 3.7 mEq/L (ref 3.5–5.1)
Sodium: 137 (ref 137–147)

## 2022-11-05 ENCOUNTER — Encounter: Payer: Self-pay | Admitting: Nurse Practitioner

## 2022-11-05 ENCOUNTER — Non-Acute Institutional Stay (SKILLED_NURSING_FACILITY): Payer: Medicare Other | Admitting: Nurse Practitioner

## 2022-11-05 DIAGNOSIS — B3749 Other urogenital candidiasis: Secondary | ICD-10-CM

## 2022-11-05 NOTE — Progress Notes (Unsigned)
Location:  Other [place].  Nursing Home Room Number: [facility] [address] Place of Service:  SNF [phone]) Abbey Chatters, NP  PCP: Earnestine Mealing, MD  Patient Care Team: Earnestine Mealing, MD as PCP - General (Family Medicine) Mariah Milling Tollie Pizza, MD as PCP - Cardiology (Cardiology) Lemar Livings Merrily Pew, MD as Consulting Physician (General Surgery) Antonieta Iba, MD as Consulting Physician (Cardiology)  Extended Emergency Contact Information Primary Emergency Contact: Nevin Bloodgood of [place] Home Phone: [phone] Mobile Phone: [phone] Relation: Daughter Secondary Emergency Contact: Amodio,Gerald Mobile Phone: [phone] Relation: Son  Goals of care: Advanced Directive information    11/05/2022    9:12 AM  Advanced Directives  Does Patient Have a Medical Advance Directive? Yes  Type of Advance Directive Out of facility DNR (pink MOST or yellow form)  Does patient want to make changes to medical advance directive? No - Patient declined     Chief Complaint  Patient presents with   Acute Visit    Redness on Penis.     HPI:  Pt is a 89 y.o. male seen today for an acute visit for Redness of the Penis.  Staff noted redness when changing brief to penis and scrotum. Pt denies pain, fever, itching or pain.  Pt is incontinent of urine.    Past Medical History:  Diagnosis Date   (HFpEF) heart failure with preserved ejection fraction (HCC)    a. 05/2019 Echo: EF 65-70%, mod LVH. Gr2 DD. Mod AS; b. 08/2020 Echo: EF 65-70%, mod LVH, GrI DD, nl RV fxn, severe AS.   AAA (abdominal aortic aneurysm) (HCC)    a. 2014 Abd U/S: 3.5cm AAA; b. 10/20189 Abd U/S: 3.3cm AAA; c. 01/2018 CT Abd/Pelvis: 2.7cm infrarenal AAA - rec f/u U/S in 5 yrs.   Actinic keratosis    Back pain    Basal cell carcinoma 08/22/2019   Left mid ear helix. Nodular pattern, not treated pt was sched for [facility] 09/12/19 and canceled notes in [employer], re bx on 03/11/22 bx proven BCC, Mohs 03/26/2022    Basal cell carcinoma 04/30/2020   Left cheek. Superficial and nodular patterns. excised   Coronary artery calcification seen on CT scan    Esophageal reflux    Essential hypertension    History of stress test    a. 01/2012 MV: EF 63%, no rwma. No ischemia/infarct. Low risk.   Hypokalemia 08/01/2022   Mixed hyperlipidemia    Osteitis deformans without mention of bone tumor    Paget's Disease   PAD (peripheral artery disease) (HCC)    a. 12/2012 and 05/2013 s/p bilat LE bypass @ [facility] in setting of bilat popliteal aneurysms; b. 05/2019 LE Duplex: R PT 50-74%, Nl L Leg.   Parkinson's disease    Persistent atrial fibrillation (HCC)    a. 02/2015 Event monitor: long periods of Afib--prev on flecainide; b. CHA2DS2VASc = 8-->Eliquis 5 bid.   Retinal detachment    Senile osteoporosis    Severe aortic stenosis    a. 05/2019 Echo: Mod AS. Mean grad 29.5 mmHg. Peak grad 55.7 mmHg. AVA 1.28 cm^2 (VTI); b. 08/2020 Echo: EF 65-70%, mod LVH, GrI DD, nl RV fxn, triv MR, mild-mod AI. Sev AS (AVA 0.87cm^2, mean grad 40, AoV Vmax 4.72m/s). Mild dil of Ao root.   Skin cancer    Squamous cell carcinoma of skin 04/02/2017   Left cheek. SCCis. Referred for Moh's, clear at appt 06/18/17. Pt opted to treat with 5FU over Mohs.   Swelling of right lower  extremity    a. 05/2019 LE U/S: No DVT. 50-64% R PT artery stenosis.   TIA (transient ischemic attack)    Type II diabetes mellitus (HCC)    Past Surgical History:  Procedure Laterality Date   APPENDECTOMY     BACK SURGERY     CATARACT EXTRACTION     COLONOSCOPY  2006,2009   ESOPHAGEAL DILATION     GALLBLADDER SURGERY     HERNIA REPAIR     Lower extremity arterial bypass Bilateral    RETINAL DETACHMENT SURGERY     TONSILLECTOMY      No Known Allergies  Outpatient Encounter Medications as of 11/05/2022  Medication Sig   acetaminophen (TYLENOL) 325 MG tablet Take 2 tablets (650 mg total) by mouth every 6 (six) hours as needed for mild pain or headache  (fever >/= 101).   apixaban (ELIQUIS) 2.5 MG TABS tablet Take 1 tablet (2.5 mg total) by mouth 2 (two) times daily.   Calcium Carb-Cholecalciferol (CALCIUM PLUS VITAMIN D) 500-5 MG-MCG TABS Take 1 tablet by mouth daily.   cyanocobalamin (,VITAMIN B-12,) 1000 MCG/ML injection Inject 0.5 mLs into the muscle every 30 (thirty) days.   diclofenac Sodium (VOLTAREN) 1 % GEL Apply 4 g topically 4 (four) times daily as needed (upper back or rib cage pain).   famotidine (PEPCID) 20 MG tablet Take 20 mg by mouth at bedtime.   FARXIGA 10 MG TABS tablet TAKE 1 TABLET BY MOUTH DAILY   furosemide (LASIX) 20 MG tablet Take 1 tablet (20 mg total) by mouth daily. Take additional 20 mg AS NEEDED with supper if progressive weight gain or worsening dyspnea   glipiZIDE-metformin (METAGLIP) 5-500 MG tablet Take 2 tablets by mouth 2 (two) times daily before a meal.   GLUCOSAMINE-CHONDROITIN PO Take 1 tablet by mouth daily.   hydrocortisone 2.5 % cream Apply 1-2 times daily to areas at neck, face, scalp as needed for itch.   Infant Care Products (DERMACLOUD EX) Apply to penile area topically every shift   JANUVIA 100 MG tablet TAKE 1 TABLET BY MOUTH DAILY   ketoconazole (NIZORAL) 2 % cream once daily to pink scaly patches at face, ears and scalp.   lidocaine (LIDODERM) 5 % Place 1 patch onto the skin daily. Remove & Discard patch within 12 hours or as directed by MD   metoprolol succinate (TOPROL-XL) 25 MG 24 hr tablet Take 0.5 tablets (12.5 mg total) by mouth daily.   Multiple Vitamins-Minerals (CEROVITE SENIOR PO) Take 1 tablet by mouth daily.   NON FORMULARY CBD 15mg  Capsules: One by mouth once daily   pantoprazole (PROTONIX) 40 MG tablet TAKE 1 TABLET BY MOUTH TWICE DAILY   rosuvastatin (CRESTOR) 10 MG tablet Take 1 tablet (10 mg total) by mouth daily.   saccharomyces boulardii (FLORASTOR) 250 MG capsule Take 250 mg by mouth daily.   sertraline (ZOLOFT) 50 MG tablet Take 1 tablet (50 mg total) by mouth daily.    STOOL SOFTENER 100 MG capsule Take 100 mg by mouth daily.   No facility-administered encounter medications on file as of 11/05/2022.    Review of Systems  Skin:  Positive for color change and rash.    Immunization History  Administered Date(s) Administered   Influenza Split 05/25/2013, 07/12/2019   Influenza, High Dose Seasonal PF 04/06/2018   Influenza,inj,Quad PF,6+ Mos 05/28/2015   Influenza-Unspecified 05/28/2015, 05/27/2016   PPD Test 05/18/2018   Pneumococcal Conjugate-13 01/30/2014   Pneumococcal Polysaccharide-23 08/19/2017   Tdap 11/23/2008   Unspecified  SARS-COV-2 Vaccination 07/29/2019, 08/19/2019, 05/17/2020   Pertinent  Health Maintenance Due  Topic Date Due   FOOT EXAM  06/25/2016   OPHTHALMOLOGY EXAM  07/03/2018   INFLUENZA VACCINE  02/05/2023   HEMOGLOBIN A1C  03/14/2023      10/27/2020   10:06 AM 01/05/2021    9:38 AM 03/09/2021   10:41 AM 05/08/2021    9:31 AM 04/27/2022   10:05 AM  Fall Risk  (RETIRED) Patient Fall Risk Level Low fall risk Moderate fall risk Moderate fall risk High fall risk Moderate fall risk   Functional Status Survey:    Vitals:   11/05/22 0848  BP: 121/71  Pulse: 78  Resp: 17  Temp: 97.7 F (36.5 C)  SpO2: 97%  Weight: 170 lb 3.2 oz (77.2 kg)  Height: 5\' 8"  (1.727 m)   Body mass index is 25.88 kg/m. Physical Exam Constitutional:      Appearance: Normal appearance.  Genitourinary:    Penis: Erythema present.   Neurological:     Mental Status: He is alert. Mental status is at baseline.  Psychiatric:        Mood and Affect: Mood normal.     Labs reviewed: Recent Labs    07/31/22 0517 08/01/22 0412 08/02/22 0511 08/03/22 0608 08/04/22 0455 09/08/22 0000 09/11/22 0000 10/27/22 0000  NA 134*   < > 134* 133* 137 137 137 137  K 3.3*   < > 3.7 4.0 4.1 4.2 3.8 3.7  CL 100   < > 99 102 101 100 102 102  CO2 25   < > 24 25 23  26* 27* 27*  GLUCOSE 162*   < > 148* 169* 167*  --   --   --   BUN 20   < > 20 23 22  23* 23*  19  CREATININE 1.33*   < > 1.22 1.34* 1.37* 1.3 1.3 1.3  CALCIUM 8.3*   < > 8.5* 8.5* 8.5* 9.6 8.7 8.5*  MG 2.0  --   --   --  2.4  --   --   --    < > = values in this interval not displayed.   Recent Labs    04/27/22 1008  AST 27  ALT 6  ALKPHOS 48  BILITOT 0.7  PROT 6.4*  ALBUMIN 3.4*   Recent Labs    04/27/22 1008 07/28/22 0433 07/29/22 0501 08/01/22 0412 08/04/22 0455  WBC 6.1   < > 7.8 7.0 5.8  NEUTROABS 3.0  --   --   --   --   HGB 10.1*   < > 9.5* 10.0* 10.3*  HCT 33.3*   < > 30.6* 30.7* 32.8*  MCV 97.1   < > 93.0 89.8 93.2  PLT 167   < > 219 218 235   < > = values in this interval not displayed.   Lab Results  Component Value Date   TSH 1.950 12/10/2017   Lab Results  Component Value Date   HGBA1C 8.0 09/11/2022   Lab Results  Component Value Date   CHOL 116 12/10/2017   HDL 32 (L) 12/10/2017   LDLCALC 21 12/10/2017   TRIG 317 (H) 12/10/2017   CHOLHDL 3.6 12/10/2017    Significant Diagnostic Results in last 30 days:  No results found.  Assessment/Plan 1. Yeast dermatitis of penis Will have staff apply nystatin cream TID until resolves.  To notify if worsening of symptoms.   Janene Harvey. Biagio [facility]  [phone]   

## 2022-11-27 ENCOUNTER — Other Ambulatory Visit: Payer: Self-pay | Admitting: Student

## 2022-11-27 NOTE — Telephone Encounter (Signed)
This is a SNF patient

## 2022-12-04 ENCOUNTER — Encounter: Payer: Self-pay | Admitting: Nurse Practitioner

## 2022-12-04 ENCOUNTER — Non-Acute Institutional Stay (SKILLED_NURSING_FACILITY): Payer: Medicare Other | Admitting: Nurse Practitioner

## 2022-12-04 DIAGNOSIS — L219 Seborrheic dermatitis, unspecified: Secondary | ICD-10-CM

## 2022-12-04 NOTE — Progress Notes (Signed)
Location:  Other [facility]) Nursing Home Room Number: 420 A Place of Service:  SNF (31)  Jenna Routzahn K. Janyth Contes, NP    Patient Care Team: Earnestine Mealing, MD as PCP - General (Family Medicine) Mariah Milling Tollie Pizza, MD as PCP - Cardiology (Cardiology) Lemar Livings Merrily Pew, MD as Consulting Physician (General Surgery) Antonieta Iba, MD as Consulting Physician (Cardiology)  Extended Emergency Contact Information Primary Emergency Contact: Nevin Bloodgood of [place] Home Phone: [phone] Mobile Phone: [phone] Relation: Daughter Secondary Emergency Contact: Servello,Gerald Mobile Phone: [phone] Relation: Son  Goals of care: Advanced Directive information    12/04/2022    2:03 PM  Advanced Directives  Does Patient Have a Medical Advance Directive? Yes  Type of Advance Directive Out of facility DNR (pink MOST or yellow form)  Does patient want to make changes to medical advance directive? No - Patient declined     Chief Complaint  Patient presents with   Acute Visit    Skin concerns     HPI:  Pt is a 89 y.o. male seen today for an acute visit for scaly red areas on chin. Staff have noticed areas over the last few days. Pt denies itching, pain or discomfort    Past Medical History:  Diagnosis Date   (HFpEF) heart failure with preserved ejection fraction (HCC)    a. 05/2019 Echo: EF 65-70%, mod LVH. Gr2 DD. Mod AS; b. 08/2020 Echo: EF 65-70%, mod LVH, GrI DD, nl RV fxn, severe AS.   AAA (abdominal aortic aneurysm) (HCC)    a. 2014 Abd U/S: 3.5cm AAA; b. 10/20189 Abd U/S: 3.3cm AAA; c. 01/2018 CT Abd/Pelvis: 2.7cm infrarenal AAA - rec f/u U/S in 5 yrs.   Actinic keratosis    Back pain    Basal cell carcinoma 08/22/2019   Left mid ear helix. Nodular pattern, not treated pt was sched for [facility] 09/12/19 and canceled notes in [employer], re bx on 03/11/22 bx proven BCC, Mohs 03/26/2022   Basal cell carcinoma 04/30/2020   Left cheek. Superficial and nodular  patterns. excised   Coronary artery calcification seen on CT scan    Esophageal reflux    Essential hypertension    History of stress test    a. 01/2012 MV: EF 63%, no rwma. No ischemia/infarct. Low risk.   Hypokalemia 08/01/2022   Mixed hyperlipidemia    Osteitis deformans without mention of bone tumor    Paget's Disease   PAD (peripheral artery disease) (HCC)    a. 12/2012 and 05/2013 s/p bilat LE bypass @ [facility] in setting of bilat popliteal aneurysms; b. 05/2019 LE Duplex: R PT 50-74%, Nl L Leg.   Parkinson's disease    Persistent atrial fibrillation (HCC)    a. 02/2015 Event monitor: long periods of Afib--prev on flecainide; b. CHA2DS2VASc = 8-->Eliquis 5 bid.   Retinal detachment    Senile osteoporosis    Severe aortic stenosis    a. 05/2019 Echo: Mod AS. Mean grad 29.5 mmHg. Peak grad 55.7 mmHg. AVA 1.28 cm^2 (VTI); b. 08/2020 Echo: EF 65-70%, mod LVH, GrI DD, nl RV fxn, triv MR, mild-mod AI. Sev AS (AVA 0.87cm^2, mean grad 40, AoV Vmax 4.36m/s). Mild dil of Ao root.   Skin cancer    Squamous cell carcinoma of skin 04/02/2017   Left cheek. SCCis. Referred for Moh's, clear at appt 06/18/17. Pt opted to treat with 5FU over Mohs.   Swelling of right lower extremity    a. 05/2019 LE U/S: No DVT. 50-64% R  PT artery stenosis.   TIA (transient ischemic attack)    Type II diabetes mellitus (HCC)    Past Surgical History:  Procedure Laterality Date   APPENDECTOMY     BACK SURGERY     CATARACT EXTRACTION     COLONOSCOPY  2006,2009   ESOPHAGEAL DILATION     GALLBLADDER SURGERY     HERNIA REPAIR     Lower extremity arterial bypass Bilateral    RETINAL DETACHMENT SURGERY     TONSILLECTOMY      No Known Allergies  Outpatient Encounter Medications as of 12/04/2022  Medication Sig   acetaminophen (TYLENOL) 325 MG tablet Take 2 tablets (650 mg total) by mouth every 6 (six) hours as needed for mild pain or headache (fever >/= 101).   apixaban (ELIQUIS) 2.5 MG TABS tablet Take 1 tablet  (2.5 mg total) by mouth 2 (two) times daily.   Calcium Carb-Cholecalciferol (CALCIUM PLUS VITAMIN D) 500-5 MG-MCG TABS Take 1 tablet by mouth daily.   cyanocobalamin (VITAMIN B12) 1000 MCG/ML injection INJECT 0.5 ML INTRAMUSCULARLY ONE TIME A DAY EVERY 1 MONTH STARTING ON THE 28TH FOR 1 DAY FOR SUPPLEMENT   diclofenac Sodium (VOLTAREN) 1 % GEL Apply 4 g topically 4 (four) times daily as needed (upper back or rib cage pain).   docusate sodium (COLACE) 100 MG capsule Take 100 mg by mouth daily. As needed for constipation   famotidine (PEPCID) 20 MG tablet Take 20 mg by mouth at bedtime.   FARXIGA 10 MG TABS tablet TAKE 1 TABLET BY MOUTH DAILY   furosemide (LASIX) 20 MG tablet Take 1 tablet (20 mg total) by mouth daily. Take additional 20 mg AS NEEDED with supper if progressive weight gain or worsening dyspnea   glipiZIDE-metformin (METAGLIP) 5-500 MG tablet Take 2 tablets by mouth 2 (two) times daily before a meal.   GLUCOSAMINE-CHONDROITIN PO Take 1 tablet by mouth daily.   hydrocortisone 2.5 % cream Apply 1-2 times daily to areas at neck, face, scalp as needed for itch.   Infant Care Products (DERMACLOUD EX) Apply to penile area topically every shift   JANUVIA 100 MG tablet TAKE 1 TABLET BY MOUTH DAILY   ketoconazole (NIZORAL) 2 % cream once daily to pink scaly patches at face, ears and scalp.   ketoconazole (NIZORAL) 2 % cream Apply 1 Application topically as directed.  Apply to Face topically every day and evening shift for Rash until 01/01/2023 23:59 x 4 weeks, see other listing also   lidocaine (LIDODERM) 5 % Place 1 patch onto the skin daily. Remove & Discard patch within 12 hours or as directed by MD   metoprolol succinate (TOPROL-XL) 25 MG 24 hr tablet Take 0.5 tablets (12.5 mg total) by mouth daily.   NON FORMULARY CBD 15mg  Capsules: One by mouth once daily   pantoprazole (PROTONIX) 40 MG tablet TAKE 1 TABLET BY MOUTH TWICE DAILY   rosuvastatin (CRESTOR) 10 MG tablet Take 1 tablet (10 mg  total) by mouth daily.   saccharomyces boulardii (FLORASTOR) 250 MG capsule Take 250 mg by mouth daily.   sertraline (ZOLOFT) 50 MG tablet Take 1 tablet (50 mg total) by mouth daily.   [DISCONTINUED] Multiple Vitamins-Minerals (CEROVITE SENIOR PO) Take 1 tablet by mouth daily.   [DISCONTINUED] STOOL SOFTENER 100 MG capsule Take 100 mg by mouth daily.   No facility-administered encounter medications on file as of 12/04/2022.    Review of Systems  Constitutional:  Negative for chills and fever.  Skin:  Positive  for rash. Negative for color change and pallor.    Immunization History  Administered Date(s) Administered   Influenza Split 05/25/2013, 07/12/2019   Influenza, High Dose Seasonal PF 04/06/2018   Influenza,inj,Quad PF,6+ Mos 05/28/2015   Influenza-Unspecified 05/28/2015, 05/27/2016   PPD Test 05/18/2018   Pneumococcal Conjugate-13 01/30/2014   Pneumococcal Polysaccharide-23 08/19/2017   Tdap 11/23/2008   Unspecified SARS-COV-2 Vaccination 07/29/2019, 08/19/2019, 05/17/2020   Pertinent  Health Maintenance Due  Topic Date Due   OPHTHALMOLOGY EXAM  07/03/2018   INFLUENZA VACCINE  02/05/2023   HEMOGLOBIN A1C  03/14/2023   FOOT EXAM  11/11/2023      10/27/2020   10:06 AM 01/05/2021    9:38 AM 03/09/2021   10:41 AM 05/08/2021    9:31 AM 04/27/2022   10:05 AM  Fall Risk  (RETIRED) Patient Fall Risk Level Low fall risk Moderate fall risk Moderate fall risk High fall risk Moderate fall risk   Functional Status Survey:    Vitals:   12/04/22 1402  BP: 114/62  Pulse: 82  Weight: 169 lb (76.7 kg)  Height: 5\' 8"  (1.727 m)   Body mass index is 25.7 kg/m. Physical Exam Constitutional:      Appearance: Normal appearance.  Skin:    Findings: Rash (thin plaque at nasolibal folds and to chin) present.  Neurological:     Mental Status: He is alert. Mental status is at baseline.  Psychiatric:        Mood and Affect: Mood normal.     Labs reviewed: Recent Labs     07/31/22 0517 08/01/22 0412 08/02/22 0511 08/03/22 0608 08/04/22 0455 09/08/22 0000 09/11/22 0000 10/27/22 0000  NA 134*   < > 134* 133* 137 137 137 137  K 3.3*   < > 3.7 4.0 4.1 4.2 3.8 3.7  CL 100   < > 99 102 101 100 102 102  CO2 25   < > 24 25 23  26* 27* 27*  GLUCOSE 162*   < > 148* 169* 167*  --   --   --   BUN 20   < > 20 23 22  23* 23* 19  CREATININE 1.33*   < > 1.22 1.34* 1.37* 1.3 1.3 1.3  CALCIUM 8.3*   < > 8.5* 8.5* 8.5* 9.6 8.7 8.5*  MG 2.0  --   --   --  2.4  --   --   --    < > = values in this interval not displayed.   Recent Labs    04/27/22 1008  AST 27  ALT 6  ALKPHOS 48  BILITOT 0.7  PROT 6.4*  ALBUMIN 3.4*   Recent Labs    04/27/22 1008 07/28/22 0433 07/29/22 0501 08/01/22 0412 08/04/22 0455  WBC 6.1   < > 7.8 7.0 5.8  NEUTROABS 3.0  --   --   --   --   HGB 10.1*   < > 9.5* 10.0* 10.3*  HCT 33.3*   < > 30.6* 30.7* 32.8*  MCV 97.1   < > 93.0 89.8 93.2  PLT 167   < > 219 218 235   < > = values in this interval not displayed.   Lab Results  Component Value Date   TSH 1.950 12/10/2017   Lab Results  Component Value Date   HGBA1C 8.0 09/11/2022   Lab Results  Component Value Date   CHOL 116 12/10/2017   HDL 32 (L) 12/10/2017   LDLCALC 21 12/10/2017   TRIG  317 (H) 12/10/2017   CHOLHDL 3.6 12/10/2017    Significant Diagnostic Results in last 30 days:  No results found.  Assessment/Plan 1. Seborrheic dermatitis -ketoconazole cream 2% to affected area BID for 4 weeks.  Notify if area worsens.    Janene Harvey. Biagio Borg [facility] [phone]

## 2022-12-16 ENCOUNTER — Encounter: Payer: Self-pay | Admitting: Student

## 2022-12-18 ENCOUNTER — Encounter: Payer: Self-pay | Admitting: Nurse Practitioner

## 2022-12-18 ENCOUNTER — Non-Acute Institutional Stay (SKILLED_NURSING_FACILITY): Payer: Medicare Other | Admitting: Nurse Practitioner

## 2022-12-18 DIAGNOSIS — I10 Essential (primary) hypertension: Secondary | ICD-10-CM | POA: Diagnosis not present

## 2022-12-18 DIAGNOSIS — I480 Paroxysmal atrial fibrillation: Secondary | ICD-10-CM

## 2022-12-18 DIAGNOSIS — E785 Hyperlipidemia, unspecified: Secondary | ICD-10-CM

## 2022-12-18 DIAGNOSIS — G20A1 Parkinson's disease without dyskinesia, without mention of fluctuations: Secondary | ICD-10-CM | POA: Diagnosis not present

## 2022-12-18 DIAGNOSIS — E1169 Type 2 diabetes mellitus with other specified complication: Secondary | ICD-10-CM

## 2022-12-18 DIAGNOSIS — I350 Nonrheumatic aortic (valve) stenosis: Secondary | ICD-10-CM

## 2022-12-18 DIAGNOSIS — L219 Seborrheic dermatitis, unspecified: Secondary | ICD-10-CM | POA: Diagnosis not present

## 2022-12-18 DIAGNOSIS — I5032 Chronic diastolic (congestive) heart failure: Secondary | ICD-10-CM

## 2022-12-18 NOTE — Progress Notes (Signed)
Location:  Other [facility] Room Number: 420 A Place of Service:  SNF (31)  Osa Fogarty K. Janyth Contes, NP   Patient Care Team: Earnestine Mealing, MD as PCP - General (Family Medicine) Mariah Milling Tollie Pizza, MD as PCP - Cardiology (Cardiology) Lemar Livings Merrily Pew, MD as Consulting Physician (General Surgery) Antonieta Iba, MD as Consulting Physician (Cardiology)  Extended Emergency Contact Information Primary Emergency Contact: Nevin Bloodgood of [place] Home Phone: [phone] Mobile Phone: [phone] Relation: Daughter Secondary Emergency Contact: Ashford,Gerald Mobile Phone: [phone] Relation: Son  Goals of care: Advanced Directive information    12/18/2022   12:46 PM  Advanced Directives  Does Patient Have a Medical Advance Directive? Yes  Type of Advance Directive Out of facility DNR (pink MOST or yellow form)  Does patient want to make changes to medical advance directive? No - Patient declined     Chief Complaint  Patient presents with   Medical Management of Chronic Issues    Routine visit. Discuss need for shingrix, eye exam, td/tdap and additional covid boosters or post pone.     HPI:  Pt is a 89 y.o. male seen today for medical management of chronic disease.  Pt reports he is doing well but wants to be with his wife    Reports he has been eating well Last A1c 8.0 On farxiga, glipizide- metformin BID, januvia No low blood sugars  Mood has been doing well on zoloft.  Sleeping well at night.   No chest pains, palpitation, LE edema.     Past Medical History:  Diagnosis Date   (HFpEF) heart failure with preserved ejection fraction (HCC)    a. 05/2019 Echo: EF 65-70%, mod LVH. Gr2 DD. Mod AS; b. 08/2020 Echo: EF 65-70%, mod LVH, GrI DD, nl RV fxn, severe AS.   AAA (abdominal aortic aneurysm) (HCC)    a. 2014 Abd U/S: 3.5cm AAA; b. 10/20189 Abd U/S: 3.3cm AAA; c. 01/2018 CT Abd/Pelvis: 2.7cm infrarenal AAA - rec f/u U/S in 5 yrs.    Actinic keratosis    Back pain    Basal cell carcinoma 08/22/2019   Left mid ear helix. Nodular pattern, not treated pt was sched for [facility] 09/12/19 and canceled notes in [employer], re bx on 03/11/22 bx proven BCC, Mohs 03/26/2022   Basal cell carcinoma 04/30/2020   Left cheek. Superficial and nodular patterns. excised   Coronary artery calcification seen on CT scan    Esophageal reflux    Essential hypertension    History of stress test    a. 01/2012 MV: EF 63%, no rwma. No ischemia/infarct. Low risk.   Hypokalemia 08/01/2022   Mixed hyperlipidemia    Osteitis deformans without mention of bone tumor    Paget's Disease   PAD (peripheral artery disease) (HCC)    a. 12/2012 and 05/2013 s/p bilat LE bypass @ [facility] in setting of bilat popliteal aneurysms; b. 05/2019 LE Duplex: R PT 50-74%, Nl L Leg.   Parkinson's disease    Persistent atrial fibrillation (HCC)    a. 02/2015 Event monitor: long periods of Afib--prev on flecainide; b. CHA2DS2VASc = 8-->Eliquis 5 bid.   Retinal detachment    Senile osteoporosis    Severe aortic stenosis    a. 05/2019 Echo: Mod AS. Mean grad 29.5 mmHg. Peak grad 55.7 mmHg. AVA 1.28 cm^2 (VTI); b. 08/2020 Echo: EF 65-70%, mod LVH, GrI DD, nl RV fxn, triv MR, mild-mod AI. Sev AS (AVA 0.87cm^2, mean grad 40, AoV Vmax 4.45m/s). Mild dil  of Ao root.   Skin cancer    Squamous cell carcinoma of skin 04/02/2017   Left cheek. SCCis. Referred for Moh's, clear at appt 06/18/17. Pt opted to treat with 5FU over Mohs.   Swelling of right lower extremity    a. 05/2019 LE U/S: No DVT. 50-64% R PT artery stenosis.   TIA (transient ischemic attack)    Type II diabetes mellitus (HCC)    Past Surgical History:  Procedure Laterality Date   APPENDECTOMY     BACK SURGERY     CATARACT EXTRACTION     COLONOSCOPY  2006,2009   ESOPHAGEAL DILATION     GALLBLADDER SURGERY     HERNIA REPAIR     Lower extremity arterial bypass Bilateral    RETINAL DETACHMENT SURGERY     TONSILLECTOMY       No Known Allergies  Outpatient Encounter Medications as of 12/18/2022  Medication Sig   acetaminophen (TYLENOL) 325 MG tablet Take 2 tablets (650 mg total) by mouth every 6 (six) hours as needed for mild pain or headache (fever >/= 101).   apixaban (ELIQUIS) 2.5 MG TABS tablet Take 1 tablet (2.5 mg total) by mouth 2 (two) times daily.   Calcium Carb-Cholecalciferol (CALCIUM PLUS VITAMIN D) 500-5 MG-MCG TABS Take 1 tablet by mouth daily.   cyanocobalamin (VITAMIN B12) 1000 MCG/ML injection INJECT 0.5 ML INTRAMUSCULARLY ONE TIME A DAY EVERY 1 MONTH STARTING ON THE 28TH FOR 1 DAY FOR SUPPLEMENT   diclofenac Sodium (VOLTAREN) 1 % GEL Apply 4 g topically 4 (four) times daily as needed (upper back or rib cage pain).   docusate sodium (COLACE) 100 MG capsule Take 100 mg by mouth daily. As needed for constipation   famotidine (PEPCID) 20 MG tablet Take 20 mg by mouth at bedtime.   FARXIGA 10 MG TABS tablet TAKE 1 TABLET BY MOUTH DAILY   furosemide (LASIX) 20 MG tablet Take 1 tablet (20 mg total) by mouth daily. Take additional 20 mg AS NEEDED with supper if progressive weight gain or worsening dyspnea   glipiZIDE-metformin (METAGLIP) 5-500 MG tablet Take 2 tablets by mouth 2 (two) times daily before a meal.   GLUCOSAMINE-CHONDROITIN PO Take 1 tablet by mouth daily.   hydrocortisone 2.5 % cream Apply 1-2 times daily to areas at neck, face, scalp as needed for itch.   Infant Care Products (DERMACLOUD EX) Apply to penile area topically every shift   JANUVIA 100 MG tablet TAKE 1 TABLET BY MOUTH DAILY   ketoconazole (NIZORAL) 2 % cream once daily to pink scaly patches at face, ears and scalp.   ketoconazole (NIZORAL) 2 % cream Apply 1 Application topically as directed.  Apply to Face topically every day and evening shift for Rash until 01/01/2023 23:59 x 4 weeks, see other listing also   lidocaine (LIDODERM) 5 % Place 1 patch onto the skin daily. Remove & Discard patch within 12 hours or as directed by  MD   metoprolol succinate (TOPROL-XL) 25 MG 24 hr tablet Take 0.5 tablets (12.5 mg total) by mouth daily.   NON FORMULARY CBD 15mg  Capsules: One by mouth once daily   rosuvastatin (CRESTOR) 10 MG tablet Take 1 tablet (10 mg total) by mouth daily.   saccharomyces boulardii (FLORASTOR) 250 MG capsule Take 250 mg by mouth daily.   sertraline (ZOLOFT) 50 MG tablet Take 1 tablet (50 mg total) by mouth daily.   No facility-administered encounter medications on file as of 12/18/2022.    Review of Systems  Constitutional:  Negative for activity change, appetite change, fatigue and unexpected weight change.  HENT:  Negative for congestion and hearing loss.   Eyes: Negative.   Respiratory:  Negative for cough and shortness of breath.   Cardiovascular:  Negative for chest pain, palpitations and leg swelling.  Gastrointestinal:  Negative for abdominal pain, constipation and diarrhea.  Genitourinary:  Negative for difficulty urinating and dysuria.  Musculoskeletal:  Negative for arthralgias and myalgias.  Skin:  Negative for color change and wound.  Neurological:  Negative for dizziness and weakness.  Psychiatric/Behavioral:  Negative for agitation, behavioral problems and confusion.      Immunization History  Administered Date(s) Administered   Influenza Split 05/25/2013, 07/12/2019   Influenza, High Dose Seasonal PF 04/06/2018   Influenza,inj,Quad PF,6+ Mos 05/28/2015   Influenza-Unspecified 05/28/2015, 05/27/2016   PNEUMOCOCCAL CONJUGATE-20 09/04/2022   PPD Test 05/18/2018   Pneumococcal Conjugate-13 01/30/2014   Pneumococcal Polysaccharide-23 08/19/2017   Tdap 11/23/2008   Unspecified SARS-COV-2 Vaccination 07/29/2019, 08/19/2019, 05/17/2020   Zoster Recombinat (Shingrix) 01/14/2020, 03/23/2020   Pertinent  Health Maintenance Due  Topic Date Due   OPHTHALMOLOGY EXAM  07/03/2018   INFLUENZA VACCINE  02/05/2023   HEMOGLOBIN A1C  03/14/2023   FOOT EXAM  11/11/2023      10/27/2020    10:06 AM 01/05/2021    9:38 AM 03/09/2021   10:41 AM 05/08/2021    9:31 AM 04/27/2022   10:05 AM  Fall Risk  (RETIRED) Patient Fall Risk Level Low fall risk Moderate fall risk Moderate fall risk High fall risk Moderate fall risk   Functional Status Survey:    Vitals:   12/18/22 1234  BP: 132/64  Pulse: 89  Temp: (!) 97.2 F (36.2 C)  Weight: 168 lb 6.4 oz (76.4 kg)  Height: 5\' 8"  (1.727 m)   Body mass index is 25.61 kg/m. Physical Exam Constitutional:      General: He is not in acute distress.    Appearance: He is well-developed. He is not diaphoretic.  HENT:     Head: Normocephalic and atraumatic.     Right Ear: External ear normal.     Left Ear: External ear normal.     Mouth/Throat:     Pharynx: No oropharyngeal exudate.  Eyes:     Conjunctiva/sclera: Conjunctivae normal.     Pupils: Pupils are equal, round, and reactive to light.  Cardiovascular:     Rate and Rhythm: Normal rate and regular rhythm.     Heart sounds: Murmur heard.  Pulmonary:     Effort: Pulmonary effort is normal.     Breath sounds: Normal breath sounds.  Abdominal:     General: Bowel sounds are normal.     Palpations: Abdomen is soft.  Musculoskeletal:        General: No tenderness.     Cervical back: Normal range of motion and neck supple.     Right lower leg: No edema.     Left lower leg: No edema.  Skin:    General: Skin is warm and dry.  Neurological:     Mental Status: He is alert and oriented to person, place, and time.     Motor: Weakness present.     Gait: Gait abnormal.     Labs reviewed: Recent Labs    07/31/22 0517 08/01/22 0412 08/02/22 0511 08/03/22 0608 08/04/22 0455 09/08/22 0000 09/11/22 0000 10/27/22 0000  NA 134*   < > 134* 133* 137 137 137 137  K 3.3*   < >  3.7 4.0 4.1 4.2 3.8 3.7  CL 100   < > 99 102 101 100 102 102  CO2 25   < > 24 25 23  26* 27* 27*  GLUCOSE 162*   < > 148* 169* 167*  --   --   --   BUN 20   < > 20 23 22  23* 23* 19  CREATININE 1.33*   < >  1.22 1.34* 1.37* 1.3 1.3 1.3  CALCIUM 8.3*   < > 8.5* 8.5* 8.5* 9.6 8.7 8.5*  MG 2.0  --   --   --  2.4  --   --   --    < > = values in this interval not displayed.   Recent Labs    04/27/22 1008  AST 27  ALT 6  ALKPHOS 48  BILITOT 0.7  PROT 6.4*  ALBUMIN 3.4*   Recent Labs    04/27/22 1008 07/28/22 0433 07/29/22 0501 08/01/22 0412 08/04/22 0455  WBC 6.1   < > 7.8 7.0 5.8  NEUTROABS 3.0  --   --   --   --   HGB 10.1*   < > 9.5* 10.0* 10.3*  HCT 33.3*   < > 30.6* 30.7* 32.8*  MCV 97.1   < > 93.0 89.8 93.2  PLT 167   < > 219 218 235   < > = values in this interval not displayed.   Lab Results  Component Value Date   TSH 1.950 12/10/2017   Lab Results  Component Value Date   HGBA1C 8.0 09/11/2022   Lab Results  Component Value Date   CHOL 116 12/10/2017   HDL 32 (L) 12/10/2017   LDLCALC 21 12/10/2017   TRIG 317 (H) 12/10/2017   CHOLHDL 3.6 12/10/2017    Significant Diagnostic Results in last 30 days:  No results found.  Assessment/Plan 1. Seborrheic dermatitis -has resolved at this time  2. Parkinson's disease, unspecified whether dyskinesia present, unspecified whether manifestations fluctuate Not currently on any medications, doing well at this time.   3. Essential hypertension -Blood pressure well controlled, goal bp <140/90 Continue current medications and dietary modifications follow metabolic panel  4. Chronic heart failure with preserved ejection fraction (HCC) -stable, euvolemic at this time. Continues on farxiga, lasix and metoprolol  5. Paroxysmal atrial fibrillation (HCC) -rate controlled on metoprolol Follow up cbc   6. Symptomatic severe aortic stenosis with normal ejection fraction -stable at this time, continues on lasix  7. Type 2 diabetes mellitus with hyperlipidemia (HCC) -fasting blood sugars ranging from 130-200 Will follow up A1c -continue current medication, no hypoglycemia noted.   Janene Harvey. [facility] [phone]

## 2022-12-22 LAB — COMPREHENSIVE METABOLIC PANEL
Albumin: 3.6 (ref 3.5–5.0)
Calcium: 8.6 — AB (ref 8.7–10.7)
Globulin: 2.5
eGFR: 57

## 2022-12-22 LAB — BASIC METABOLIC PANEL
BUN: 16 (ref 4–21)
CO2: 26 — AB (ref 13–22)
Chloride: 102 (ref 99–108)
Creatinine: 1.3 (ref 0.6–1.3)
Glucose: 142
Potassium: 3.6 mEq/L (ref 3.5–5.1)
Sodium: 136 — AB (ref 137–147)

## 2022-12-22 LAB — HEPATIC FUNCTION PANEL
ALT: 14 U/L (ref 10–40)
AST: 23 (ref 14–40)
Alkaline Phosphatase: 51 (ref 25–125)
Bilirubin, Total: 0.6

## 2022-12-22 LAB — LIPID PANEL
Cholesterol: 100 (ref 0–200)
HDL: 42 (ref 35–70)
LDL Cholesterol: 37
Triglycerides: 120 (ref 40–160)

## 2022-12-22 LAB — CBC: RBC: 3.36 — AB (ref 3.87–5.11)

## 2022-12-22 LAB — CBC AND DIFFERENTIAL
HCT: 33 — AB (ref 41–53)
Hemoglobin: 10.7 — AB (ref 13.5–17.5)
Platelets: 166 10*3/uL (ref 150–400)
WBC: 6.2

## 2022-12-22 LAB — HEMOGLOBIN A1C: Hemoglobin A1C: 8

## 2023-01-27 ENCOUNTER — Encounter: Payer: Self-pay | Admitting: Nurse Practitioner

## 2023-01-27 ENCOUNTER — Non-Acute Institutional Stay (SKILLED_NURSING_FACILITY): Payer: Medicare Other | Admitting: Nurse Practitioner

## 2023-01-27 DIAGNOSIS — D649 Anemia, unspecified: Secondary | ICD-10-CM | POA: Diagnosis not present

## 2023-01-27 DIAGNOSIS — I5032 Chronic diastolic (congestive) heart failure: Secondary | ICD-10-CM

## 2023-01-27 DIAGNOSIS — E1169 Type 2 diabetes mellitus with other specified complication: Secondary | ICD-10-CM

## 2023-01-27 DIAGNOSIS — R1310 Dysphagia, unspecified: Secondary | ICD-10-CM

## 2023-01-27 DIAGNOSIS — R079 Chest pain, unspecified: Secondary | ICD-10-CM

## 2023-01-27 DIAGNOSIS — I480 Paroxysmal atrial fibrillation: Secondary | ICD-10-CM

## 2023-01-27 DIAGNOSIS — I350 Nonrheumatic aortic (valve) stenosis: Secondary | ICD-10-CM

## 2023-01-27 DIAGNOSIS — E785 Hyperlipidemia, unspecified: Secondary | ICD-10-CM

## 2023-01-27 NOTE — Progress Notes (Signed)
Location:  Other Nursing Home Room Number: [facility] [address]A Place of Service:  SNF ([phone])  Sydnee Cabal, [place], MD  Patient Care Team: Earnestine Mealing, MD as PCP - General (Family Medicine) Mariah Milling Tollie Pizza, MD as PCP - Cardiology (Cardiology) Lemar Livings Merrily Pew, MD as Consulting Physician (General Surgery) Antonieta Iba, MD as Consulting Physician (Cardiology) Isla Pence, OD (Optometry)  Extended Emergency Contact Information Primary Emergency Contact: Dalphine Handing [place] of [place] Home Phone: [phone] Mobile Phone: [phone] Relation: Daughter Secondary Emergency Contact: Furey,Gerald Mobile Phone: [phone] Relation: Son  Goals of care: Advanced Directive information    01/27/2023    2:35 PM  Advanced Directives  Does Patient Have a Medical Advance Directive? Yes  Type of Advance Directive Out of facility DNR (pink MOST or yellow form)  Does patient want to make changes to medical advance directive? No - Patient declined     Chief Complaint  Patient presents with   Medical Management of Chronic Issues    Medical Management of Chronic Issues.    Acute Visit    Chest Pains.     HPI:  Pt is a 89 y.o. male seen today for medical management of chronic disease. Pt with hx of CHF, DM, anxiety, severe aortic stenosis.  He reports he has had chest pains the last 2 mornings that resolves by mid morning. He is unable to describe the pain but states that there is no shortness of breath associated with chest pain. Does not radiate.  Reports overall some worsening shortness of breath but not with activity.  No chest pains with activity- he walked with restorative and did not have any pain at that time.  No increase in LE edema.  He was hospitalized with chest pain in January and noted to have acute CHF.  He has not had any significant weight gain noted at this time.   He does goes out with family with meals and appts at times  A1c 8.0, blood  sugars ranging from 130-200s most of the time, occasionally more elevated  He reports worsening anxiety recently Noted he did have a GDR on zoloft earlier this month.    Past Medical History:  Diagnosis Date   (HFpEF) heart failure with preserved ejection fraction (HCC)    a. 05/2019 Echo: EF 65-70%, mod LVH. Gr2 DD. Mod AS; b. 08/2020 Echo: EF 65-70%, mod LVH, GrI DD, nl RV fxn, severe AS.   AAA (abdominal aortic aneurysm) (HCC)    a. 2014 Abd U/S: 3.5cm AAA; b. 10/20189 Abd U/S: 3.3cm AAA; c. 01/2018 CT Abd/Pelvis: 2.7cm infrarenal AAA - rec f/u U/S in 5 yrs.   Actinic keratosis    Back pain    Basal cell carcinoma 08/22/2019   Left mid ear helix. Nodular pattern, not treated pt was sched for [facility] 09/12/19 and canceled notes in [employer], re bx on 03/11/22 bx proven BCC, Mohs 03/26/2022   Basal cell carcinoma 04/30/2020   Left cheek. Superficial and nodular patterns. excised   Coronary artery calcification seen on CT scan    Esophageal reflux    Essential hypertension    History of stress test    a. 01/2012 MV: EF 63%, no rwma. No ischemia/infarct. Low risk.   Hypokalemia 08/01/2022   Mixed hyperlipidemia    Osteitis deformans without mention of bone tumor    Paget's Disease   PAD (peripheral artery disease) (HCC)    a. 12/2012 and 05/2013 s/p bilat LE bypass @ [facility] in setting of bilat  popliteal aneurysms; b. 05/2019 LE Duplex: R PT 50-74%, Nl L Leg.   Parkinson's disease    Persistent atrial fibrillation (HCC)    a. 02/2015 Event monitor: long periods of Afib--prev on flecainide; b. CHA2DS2VASc = 8-->Eliquis 5 bid.   Retinal detachment    Senile osteoporosis    Severe aortic stenosis    a. 05/2019 Echo: Mod AS. Mean grad 29.5 mmHg. Peak grad 55.7 mmHg. AVA 1.28 cm^2 (VTI); b. 08/2020 Echo: EF 65-70%, mod LVH, GrI DD, nl RV fxn, triv MR, mild-mod AI. Sev AS (AVA 0.87cm^2, mean grad 40, AoV Vmax 4.27m/s). Mild dil of Ao root.   Skin cancer    Squamous cell carcinoma of skin 04/02/2017    Left cheek. SCCis. Referred for Moh's, clear at appt 06/18/17. Pt opted to treat with 5FU over Mohs.   Swelling of right lower extremity    a. 05/2019 LE U/S: No DVT. 50-64% R PT artery stenosis.   TIA (transient ischemic attack)    Type II diabetes mellitus (HCC)    Past Surgical History:  Procedure Laterality Date   APPENDECTOMY     BACK SURGERY     CATARACT EXTRACTION     COLONOSCOPY  2006,2009   ESOPHAGEAL DILATION     GALLBLADDER SURGERY     HERNIA REPAIR     Lower extremity arterial bypass Bilateral    RETINAL DETACHMENT SURGERY     TONSILLECTOMY      No Known Allergies  Outpatient Encounter Medications as of 01/27/2023  Medication Sig   acetaminophen (TYLENOL) 325 MG tablet Take 2 tablets (650 mg total) by mouth every 6 (six) hours as needed for mild pain or headache (fever >/= 101).   apixaban (ELIQUIS) 2.5 MG TABS tablet Take 1 tablet (2.5 mg total) by mouth 2 (two) times daily.   Calcium Carb-Cholecalciferol (CALCIUM PLUS VITAMIN D) 500-5 MG-MCG TABS Take 1 tablet by mouth daily.   cyanocobalamin (VITAMIN B12) 1000 MCG/ML injection INJECT 0.5 ML INTRAMUSCULARLY ONE TIME A DAY EVERY 1 MONTH STARTING ON THE 28TH FOR 1 DAY FOR SUPPLEMENT   diclofenac Sodium (VOLTAREN) 1 % GEL Apply 4 g topically 4 (four) times daily as needed (upper back or rib cage pain).   docusate sodium (COLACE) 100 MG capsule Take 100 mg by mouth daily. As needed for constipation   famotidine (PEPCID) 20 MG tablet Take 20 mg by mouth at bedtime.   FARXIGA 10 MG TABS tablet TAKE 1 TABLET BY MOUTH DAILY   furosemide (LASIX) 20 MG tablet Take 1 tablet (20 mg total) by mouth daily. Take additional 20 mg AS NEEDED with supper if progressive weight gain or worsening dyspnea   glipiZIDE-metformin (METAGLIP) 5-500 MG tablet Take 2 tablets by mouth 2 (two) times daily before a meal.   GLUCOSAMINE-CHONDROITIN PO Take 1 tablet by mouth daily.   hydrocortisone 2.5 % cream Apply 1-2 times daily to areas at neck,  face, scalp as needed for itch.   Infant Care Products (DERMACLOUD EX) Apply to penile area topically every shift   JANUVIA 100 MG tablet TAKE 1 TABLET BY MOUTH DAILY   ketoconazole (NIZORAL) 2 % cream once daily to pink scaly patches at face, ears and scalp.   ketoconazole (NIZORAL) 2 % cream Apply 1 Application topically as directed.  Apply to Face topically every day and evening shift for Rash until 01/01/2023 23:59 x 4 weeks, see other listing also   lidocaine (LIDODERM) 5 % Place 1 patch onto the skin daily. Remove &  Discard patch within 12 hours or as directed by MD   metoprolol succinate (TOPROL-XL) 25 MG 24 hr tablet Take 0.5 tablets (12.5 mg total) by mouth daily.   NON FORMULARY CBD 15mg  Capsules: One by mouth once daily   rosuvastatin (CRESTOR) 10 MG tablet Take 1 tablet (10 mg total) by mouth daily.   saccharomyces boulardii (FLORASTOR) 250 MG capsule Take 250 mg by mouth daily.   sertraline (ZOLOFT) 50 MG tablet Take 1 tablet (50 mg total) by mouth daily.   Zinc Oxide (TRIPLE PASTE) 12.8 % ointment Apply 1 Application topically 2 (two) times daily as needed for irritation.   No facility-administered encounter medications on file as of 01/27/2023.    Review of Systems  Constitutional:  Negative for activity change, appetite change, fatigue and unexpected weight change.  HENT:  Negative for congestion and hearing loss.   Eyes: Negative.   Respiratory:  Positive for shortness of breath. Negative for cough.   Cardiovascular:  Positive for chest pain. Negative for palpitations and leg swelling.  Gastrointestinal:  Negative for abdominal pain, constipation and diarrhea.  Genitourinary:  Negative for difficulty urinating and dysuria.  Musculoskeletal:  Negative for arthralgias and myalgias.  Skin:  Negative for color change and wound.  Neurological:  Negative for dizziness and weakness.  Psychiatric/Behavioral:  Negative for agitation, behavioral problems and confusion.       Immunization History  Administered Date(s) Administered   Influenza Split 05/25/2013, 07/12/2019   Influenza, High Dose Seasonal PF 04/06/2018   Influenza,inj,Quad PF,6+ Mos 05/28/2015   Influenza-Unspecified 05/28/2015, 05/27/2016   PNEUMOCOCCAL CONJUGATE-20 09/04/2022   PPD Test 05/18/2018   Pneumococcal Conjugate-13 01/30/2014   Pneumococcal Polysaccharide-23 08/19/2017   Tdap 11/23/2008   Unspecified SARS-COV-2 Vaccination 07/29/2019, 08/19/2019, 05/17/2020   Zoster Recombinant(Shingrix) 01/14/2020, 03/23/2020   Pertinent  Health Maintenance Due  Topic Date Due   INFLUENZA VACCINE  02/05/2023   HEMOGLOBIN A1C  06/23/2023   OPHTHALMOLOGY EXAM  10/15/2023   FOOT EXAM  11/11/2023      10/27/2020   10:06 AM 01/05/2021    9:38 AM 03/09/2021   10:41 AM 05/08/2021    9:31 AM 04/27/2022   10:05 AM  Fall Risk  (RETIRED) Patient Fall Risk Level Low fall risk Moderate fall risk Moderate fall risk High fall risk Moderate fall risk   Functional Status Survey:    Vitals:   01/27/23 1425  BP: 126/66  Pulse: 94  Resp: 18  Temp: (!) 97.2 F (36.2 C)  SpO2: 98%  Weight: 172 lb (78 kg)  Height: 5\' 8"  (1.727 m)   Body mass index is 26.15 kg/m. Physical Exam Constitutional:      General: He is not in acute distress.    Appearance: He is well-developed. He is not diaphoretic.  HENT:     Head: Normocephalic and atraumatic.     Right Ear: External ear normal.     Left Ear: External ear normal.     Mouth/Throat:     Pharynx: No oropharyngeal exudate.  Eyes:     Conjunctiva/sclera: Conjunctivae normal.     Pupils: Pupils are equal, round, and reactive to light.  Cardiovascular:     Rate and Rhythm: Normal rate and regular rhythm.     Heart sounds: Normal heart sounds.  Pulmonary:     Effort: Pulmonary effort is normal.     Breath sounds: Normal breath sounds.  Abdominal:     General: Bowel sounds are normal.     Palpations: Abdomen is  soft.  Musculoskeletal:         General: No tenderness.     Cervical back: Normal range of motion and neck supple.     Right lower leg: No edema.     Left lower leg: No edema.  Skin:    General: Skin is warm and dry.  Neurological:     Mental Status: He is alert. Mental status is at baseline.     Motor: Weakness present.     Gait: Gait abnormal.     Labs reviewed: Recent Labs    07/31/22 0517 08/01/22 0412 08/02/22 0511 08/03/22 0608 08/04/22 0455 09/08/22 0000 09/11/22 0000 10/27/22 0000 12/22/22 0000  NA 134*   < > 134* 133* 137   < > 137 137 136*  K 3.3*   < > 3.7 4.0 4.1   < > 3.8 3.7 3.6  CL 100   < > 99 102 101   < > 102 102 102  CO2 25   < > 24 25 23    < > 27* 27* 26*  GLUCOSE 162*   < > 148* 169* 167*  --   --   --   --   BUN 20   < > 20 23 22    < > 23* 19 16  CREATININE 1.33*   < > 1.22 1.34* 1.37*   < > 1.3 1.3 1.3  CALCIUM 8.3*   < > 8.5* 8.5* 8.5*   < > 8.7 8.5* 8.6*  MG 2.0  --   --   --  2.4  --   --   --   --    < > = values in this interval not displayed.   Recent Labs    04/27/22 1008 12/22/22 0000  AST 27 23  ALT 6 14  ALKPHOS 48 51  BILITOT 0.7  --   PROT 6.4*  --   ALBUMIN 3.4* 3.6   Recent Labs    04/27/22 1008 07/28/22 0433 07/29/22 0501 08/01/22 0412 08/04/22 0455 12/22/22 0000  WBC 6.1   < > 7.8 7.0 5.8 6.2  NEUTROABS 3.0  --   --   --   --   --   HGB 10.1*   < > 9.5* 10.0* 10.3* 10.7*  HCT 33.3*   < > 30.6* 30.7* 32.8* 33*  MCV 97.1   < > 93.0 89.8 93.2  --   PLT 167   < > 219 218 235 166   < > = values in this interval not displayed.   Lab Results  Component Value Date   TSH 1.950 12/10/2017   Lab Results  Component Value Date   HGBA1C 8.0 12/22/2022   Lab Results  Component Value Date   CHOL 100 12/22/2022   HDL 42 12/22/2022   LDLCALC 37 12/22/2022   TRIG 120 12/22/2022   CHOLHDL 3.6 12/10/2017    Significant Diagnostic Results in last 30 days:  No results found.  Assessment/Plan 1. Type 2 diabetes mellitus with hyperlipidemia  (HCC) -A1c at 8. Will continue current medication. No hypoglycemia noted.   2. Chronic heart failure with preserved ejection fraction (HCC) Euvolemic at this time. Continues on lasix, farxiga and metoprolol. Will have him follow up with cardiology  3. Dysphagia, unspecified type -ongoing, will continue to monitor.   4. Anemia, unspecified type -will follow up cbc to ensure stability  5. Symptomatic severe aortic stenosis with normal ejection fraction -ongoing, question if chest pain due to stenosis. He  does not have any findings to suggested worsening fluid retention. Continues on lasix.  Will have him follow up with cardiology  6. Paroxysmal atrial fibrillation (HCC) Rate controlled, continues on eliquis for anticoagulation.   7. Chest pain, unspecified type Reports chest discomfort for last 2 mornings. Vital signs have been stable, no diaphoresis.  No other symptoms noted, no worsening shortness of breath associated with chest pains but reports some overall worsening shortness of breath at baseline. No LE edema.  Will have staff obtain EKG and chest xray at this time.  To notify for worsening or changes in symptoms Will also obtain labs   8. Anxiety Worsening anxiety since his GDR of zoloft. Staff and patient have noticed change.  Will increase zoloft back to 50 mg at this time to help with symptoms. Question if some of his chest discomfort due to increase in anxiety.  Janene Harvey. [facility] [phone]

## 2023-01-29 ENCOUNTER — Non-Acute Institutional Stay (INDEPENDENT_AMBULATORY_CARE_PROVIDER_SITE_OTHER): Payer: Medicare Other | Admitting: Nurse Practitioner

## 2023-01-29 ENCOUNTER — Encounter: Payer: Self-pay | Admitting: Nurse Practitioner

## 2023-01-29 DIAGNOSIS — Z0000 Encounter for general adult medical examination without abnormal findings: Secondary | ICD-10-CM | POA: Diagnosis not present

## 2023-01-29 NOTE — Patient Instructions (Signed)
  Timothy [name] , Thank you for taking time to come for your Medicare Wellness Visit. I appreciate your ongoing commitment to your health goals. Please review the following plan we discussed and let me know if I can assist you in the future.   This is a list of the screening recommended for you and due dates:  Health Maintenance  Topic Date Due   DTaP/Tdap/Td vaccine (2 - Td or Tdap) 11/24/2018   COVID-19 Vaccine (4 - 2023-24 season) 03/07/2022   Flu Shot  02/05/2023   Hemoglobin A1C  06/23/2023   Eye exam for diabetics  10/15/2023   Complete foot exam   11/11/2023   Medicare Annual Wellness Visit  01/29/2024   Pneumonia Vaccine  Completed   Zoster (Shingles) Vaccine  Completed   HPV Vaccine  Aged Out   Due for TDAP if he has not had somewhere else since 2020

## 2023-01-29 NOTE — Progress Notes (Signed)
Subjective:   Timothy [name] is a 89 y.o. male who presents for Medicare Annual/Subsequent preventive examination.  Visit Complete: In person [facility]  Patient Medicare AWV questionnaire was completed by the patient on 01/29/23; I have confirmed that all information answered by patient is correct and no changes since this date.  Review of Systems     Cardiac Risk Factors include: advanced age (>3men, >41 women);hypertension;dyslipidemia;male gender;sedentary lifestyle     Objective:    Today's Vitals   01/29/23 1046  BP: 132/64  Pulse: 94  Resp: 18  Temp: (!) 97.2 F (36.2 C)  SpO2: 98%  Weight: 170 lb 6.4 oz (77.3 kg)  Height: 5\' 8"  (1.727 m)   Body mass index is 25.91 kg/m.     01/29/2023   10:53 AM 01/27/2023    2:35 PM 12/18/2022   12:46 PM 12/04/2022    2:03 PM 11/05/2022    9:12 AM 10/22/2022    9:31 AM 08/11/2022    1:58 PM  Advanced Directives  Does Patient Have a Medical Advance Directive? Yes Yes Yes Yes Yes Yes Yes  Type of Advance Directive Out of facility DNR (pink MOST or yellow form) Out of facility DNR (pink MOST or yellow form) Out of facility DNR (pink MOST or yellow form) Out of facility DNR (pink MOST or yellow form) Out of facility DNR (pink MOST or yellow form) Out of facility DNR (pink MOST or yellow form) Out of facility DNR (pink MOST or yellow form)  Does patient want to make changes to medical advance directive? No - Patient declined No - Patient declined No - Patient declined No - Patient declined No - Patient declined No - Patient declined No - Patient declined    Current Medications (verified) Outpatient Encounter Medications as of 01/29/2023  Medication Sig   acetaminophen (TYLENOL) 325 MG tablet Take 2 tablets (650 mg total) by mouth every 6 (six) hours as needed for mild pain or headache (fever >/= 101).   apixaban (ELIQUIS) 2.5 MG TABS tablet Take 1 tablet (2.5 mg total) by mouth 2 (two) times daily.   Calcium Carb-Cholecalciferol (CALCIUM  PLUS VITAMIN D) 500-5 MG-MCG TABS Take 1 tablet by mouth daily.   cyanocobalamin (VITAMIN B12) 1000 MCG/ML injection INJECT 0.5 ML INTRAMUSCULARLY ONE TIME A DAY EVERY 1 MONTH STARTING ON THE 28TH FOR 1 DAY FOR SUPPLEMENT   diclofenac Sodium (VOLTAREN) 1 % GEL Apply 4 g topically 4 (four) times daily as needed (upper back or rib cage pain).   docusate sodium (COLACE) 100 MG capsule Take 100 mg by mouth daily. As needed for constipation   famotidine (PEPCID) 20 MG tablet Take 20 mg by mouth at bedtime.   FARXIGA 10 MG TABS tablet TAKE 1 TABLET BY MOUTH DAILY   furosemide (LASIX) 20 MG tablet Take 1 tablet (20 mg total) by mouth daily. Take additional 20 mg AS NEEDED with supper if progressive weight gain or worsening dyspnea   glipiZIDE-metformin (METAGLIP) 5-500 MG tablet Take 2 tablets by mouth 2 (two) times daily before a meal.   GLUCOSAMINE-CHONDROITIN PO Take 1 tablet by mouth daily.   hydrocortisone 2.5 % cream Apply 1-2 times daily to areas at neck, face, scalp as needed for itch.   Infant Care Products [facility] EX) Apply to penile area topically every shift   JANUVIA 100 MG tablet TAKE 1 TABLET BY MOUTH DAILY   ketoconazole (NIZORAL) 2 % cream once daily to pink scaly patches at face, ears and  scalp.   lidocaine (LIDODERM) 5 % Place 1 patch onto the skin daily. Remove & Discard patch within 12 hours or as directed by MD   metoprolol succinate (TOPROL-XL) 25 MG 24 hr tablet Take 0.5 tablets (12.5 mg total) by mouth daily.   NON FORMULARY CBD 15mg  Capsules: One by mouth once daily   rosuvastatin (CRESTOR) 10 MG tablet Take 1 tablet (10 mg total) by mouth daily.   saccharomyces boulardii (FLORASTOR) 250 MG capsule Take 250 mg by mouth daily.   sertraline (ZOLOFT) 50 MG tablet Take 1 tablet (50 mg total) by mouth daily.   Zinc Oxide (TRIPLE PASTE) 12.8 % ointment Apply 1 Application topically 2 (two) times daily as needed for irritation.   [DISCONTINUED] ketoconazole (NIZORAL) 2 % cream  Apply 1 Application topically as directed.  Apply to Face topically every day and evening shift for Rash until 01/01/2023 23:59 x 4 weeks, see other listing also   No facility-administered encounter medications on file as of 01/29/2023.    Allergies (verified) Patient has no known allergies.   History: Past Medical History:  Diagnosis Date   (HFpEF) heart failure with preserved ejection fraction (HCC)    a. 05/2019 Echo: EF 65-70%, mod LVH. Gr2 DD. Mod AS; b. 08/2020 Echo: EF 65-70%, mod LVH, GrI DD, nl RV fxn, severe AS.   AAA (abdominal aortic aneurysm) (HCC)    a. 2014 Abd U/S: 3.5cm AAA; b. 10/20189 Abd U/S: 3.3cm AAA; c. 01/2018 CT Abd/Pelvis: 2.7cm infrarenal AAA - rec f/u U/S in 5 yrs.   Actinic keratosis    Back pain    Basal cell carcinoma 08/22/2019   Left mid ear helix. Nodular pattern, not treated pt was sched for [facility] 09/12/19 and canceled notes in [employer], re bx on 03/11/22 bx proven BCC, Mohs 03/26/2022   Basal cell carcinoma 04/30/2020   Left cheek. Superficial and nodular patterns. excised   Coronary artery calcification seen on CT scan    Esophageal reflux    Essential hypertension    History of stress test    a. 01/2012 MV: EF 63%, no rwma. No ischemia/infarct. Low risk.   Hypokalemia 08/01/2022   Mixed hyperlipidemia    Osteitis deformans without mention of bone tumor    Paget's Disease   PAD (peripheral artery disease) (HCC)    a. 12/2012 and 05/2013 s/p bilat LE bypass @ [facility] in setting of bilat popliteal aneurysms; b. 05/2019 LE Duplex: R PT 50-74%, Nl L Leg.   Parkinson's disease    Persistent atrial fibrillation (HCC)    a. 02/2015 Event monitor: long periods of Afib--prev on flecainide; b. CHA2DS2VASc = 8-->Eliquis 5 bid.   Retinal detachment    Senile osteoporosis    Severe aortic stenosis    a. 05/2019 Echo: Mod AS. Mean grad 29.5 mmHg. Peak grad 55.7 mmHg. AVA 1.28 cm^2 (VTI); b. 08/2020 Echo: EF 65-70%, mod LVH, GrI DD, nl RV fxn, triv MR, mild-mod AI. Sev  AS (AVA 0.87cm^2, mean grad 40, AoV Vmax 4.42m/s). Mild dil of Ao root.   Skin cancer    Squamous cell carcinoma of skin 04/02/2017   Left cheek. SCCis. Referred for Moh's, clear at appt 06/18/17. Pt opted to treat with 5FU over Mohs.   Swelling of right lower extremity    a. 05/2019 LE U/S: No DVT. 50-64% R PT artery stenosis.   TIA (transient ischemic attack)    Type II diabetes mellitus [facility])    Past Surgical History:  Procedure Laterality Date  APPENDECTOMY     BACK SURGERY     CATARACT EXTRACTION     COLONOSCOPY  2006,2009   ESOPHAGEAL DILATION     GALLBLADDER SURGERY     HERNIA REPAIR     Lower extremity arterial bypass Bilateral    RETINAL DETACHMENT SURGERY     TONSILLECTOMY     Family History  Problem Relation Age of Onset   Heart disease Mother    Diabetes Father    Retinal detachment Brother    Heart disease Brother    Stroke Brother    Diabetes Other        grandparents   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  Tobacco Use   Smoking status: Never   Smokeless tobacco: Never  Vaping Use   Vaping status: Never Used  Substance and Sexual Activity   Alcohol use: No   Drug use: No   Sexual activity: Not Currently  Other Topics Concern   Not on file  Social History Narrative   Not on file   Social Determinants of Health   Financial Resource Strain: Not on file  Food Insecurity: No Food Insecurity (07/30/2022)   Hunger Vital Sign    Worried About Running Out of Food in the Last Year: Never true    Ran Out of Food in the Last Year: Never true  Transportation Needs: No Transportation Needs (07/30/2022)   PRAPARE - Administrator, Civil Service (Medical): No    Lack of Transportation (Non-Medical): No  Physical Activity: Not on file   Stress: Not on file  Social Connections: Not on file    Tobacco Counseling Counseling given: Not Answered   Clinical Intake:  Pre-visit preparation completed: Yes  Pain : No/denies pain     BMI - recorded: 25 Nutritional Risks: None  How often do you need to have someone help you when you read instructions, pamphlets, or other written materials from your doctor or pharmacy?: 3 - Sometimes         Activities of Daily Living    01/29/2023   12:19 PM 07/30/2022    2:00 PM  In your present state of health, do you have any difficulty performing the following activities:  Hearing? 1 0  Vision? 0 0  Difficulty concentrating or making decisions? 1 1  Walking or climbing stairs? 1 1  Dressing or bathing? 1 1  Doing errands, shopping? 1 0  Preparing Food and eating ? Y   Using the Toilet? Y   In the past six months, have you accidently leaked urine? Y   Do you have problems with loss of bowel control? N   Managing your Medications? Y   Managing your Finances? Y   Housekeeping or managing your Housekeeping? Y     Patient Care Team: Earnestine Mealing, MD as PCP - General (Family Medicine) Antonieta Iba, MD as PCP - Cardiology (Cardiology) Lemar Livings Merrily Pew, MD as Consulting Physician (General Surgery) Antonieta Iba, MD as Consulting Physician (Cardiology) Isla Pence, OD (Optometry)  Indicate any recent Medical Services you may have received from other than [facility] providers in the past year (date may be approximate).     Assessment:   This is a routine wellness examination  for [place].  Hearing/Vision screen No results found.  Dietary issues and exercise activities discussed:     Goals Addressed   None    Depression Screen    12/10/2017    3:49 PM 08/19/2017    3:25 PM 11/27/2016    2:31 PM 11/27/2015    4:15 PM  PHQ 2/9 Scores  PHQ - 2 Score 0 0 0 1    Fall Risk    05/26/2019    6:59 PM 06/14/2018    2:31 PM 02/04/2018   10:54 AM 12/10/2017     3:49 PM 08/19/2017    3:25 PM  Fall Risk   Falls in the past year? 0 0 No No Yes  Comment Emmi Telephone Survey: data to providers prior to load      Number falls in past yr:  0   1  Injury with Fall?     No  Risk for fall due to :  Impaired balance/gait;Impaired mobility     Follow up  Falls evaluation completed       MEDICARE RISK AT HOME:   TIMED UP AND GO:  Was the test performed?  No    Cognitive Function:        Immunizations Immunization History  Administered Date(s) Administered   Influenza Split 05/25/2013, 07/12/2019   Influenza, High Dose Seasonal PF 04/06/2018   Influenza,inj,Quad PF,6+ Mos 05/28/2015   Influenza-Unspecified 05/28/2015, 05/27/2016   PNEUMOCOCCAL CONJUGATE-20 09/04/2022   PPD Test 05/18/2018   Pneumococcal Conjugate-13 01/30/2014   Pneumococcal Polysaccharide-23 08/19/2017   Tdap 11/23/2008   Unspecified SARS-COV-2 Vaccination 07/29/2019, 08/19/2019, 05/17/2020   Zoster Recombinant(Shingrix) 01/14/2020, 03/23/2020    TDAP status: Due, Education has been provided regarding the importance of this vaccine. Advised may receive this vaccine at local pharmacy or Health Dept. Aware to provide a copy of the vaccination record if obtained from local pharmacy or Health Dept. Verbalized acceptance and understanding.  Flu Vaccine status: Up to date  Pneumococcal vaccine status: Up to date  Covid-19 vaccine status: Information provided on how to obtain vaccines.   Qualifies for Shingles Vaccine? Yes   Zostavax completed No   Shingrix Completed?: Yes  Screening Tests Health Maintenance  Topic Date Due   DTaP/Tdap/Td (2 - Td or Tdap) 11/24/2018   COVID-19 Vaccine (4 - 2023-24 season) 03/07/2022   INFLUENZA VACCINE  02/05/2023   HEMOGLOBIN A1C  06/23/2023   OPHTHALMOLOGY EXAM  10/15/2023   FOOT EXAM  11/11/2023   Medicare Annual Wellness (AWV)  01/29/2024   Pneumonia Vaccine 57+ Years old  Completed   Zoster Vaccines- Shingrix  Completed   HPV  VACCINES  Aged Out    Health Maintenance  Health Maintenance Due  Topic Date Due   DTaP/Tdap/Td (2 - Td or Tdap) 11/24/2018   COVID-19 Vaccine (4 - 2023-24 season) 03/07/2022    Colorectal cancer screening: No longer required.   Lung Cancer Screening: (Low Dose CT Chest recommended if Age 29-80 years, 20 pack-year currently smoking OR have quit w/in 15years.) does not qualify.   Lung Cancer Screening Referral: na  Additional Screening:  Hepatitis C Screening: does not qualify  Vision Screening: Recommended annual ophthalmology exams for early detection of glaucoma and other disorders of the eye. Is the patient up to date with their annual eye exam?  Yes  Who is the provider or what is the name of the office in which the patient attends annual eye exams? [facility] If pt is not established with  a provider, would they like to be referred to a provider to establish care? No .   Dental Screening: Recommended annual dental exams for proper oral hygiene  Community Resource Referral / Chronic Care Management: CRR required this visit?  No   CCM required this visit?  No     Plan:     I have personally reviewed and noted the following in the patient's chart:   Medical and social history Use of alcohol, tobacco or illicit drugs  Current medications and supplements including opioid prescriptions. Patient is not currently taking opioid prescriptions. Functional ability and status Nutritional status Physical activity Advanced directives List of other physicians Hospitalizations, surgeries, and ER visits in previous 12 months Vitals Screenings to include cognitive, depression, and falls Referrals and appointments  In addition, I have reviewed and discussed with patient certain preventive protocols, quality metrics, and best practice recommendations. A written personalized care plan for preventive services as well as general preventive health recommendations were provided  to patient.     Sharon Seller, NP   01/29/2023

## 2023-02-07 ENCOUNTER — Inpatient Hospital Stay
Admission: EM | Admit: 2023-02-07 | Discharge: 2023-02-10 | DRG: 291 | Disposition: A | Discharge status: Other Acute Inpatient Hospital | Payer: Medicare Other | Source: Skilled Nursing Facility | Attending: Internal Medicine | Admitting: Internal Medicine

## 2023-02-07 ENCOUNTER — Encounter: Payer: Self-pay | Admitting: Emergency Medicine

## 2023-02-07 ENCOUNTER — Other Ambulatory Visit: Payer: Self-pay

## 2023-02-07 ENCOUNTER — Telehealth: Payer: Self-pay | Admitting: Student

## 2023-02-07 ENCOUNTER — Emergency Department: Payer: Medicare Other

## 2023-02-07 DIAGNOSIS — I130 Hypertensive heart and chronic kidney disease with heart failure and stage 1 through stage 4 chronic kidney disease, or unspecified chronic kidney disease: Principal | ICD-10-CM | POA: Diagnosis present

## 2023-02-07 DIAGNOSIS — I259 Chronic ischemic heart disease, unspecified: Secondary | ICD-10-CM | POA: Diagnosis not present

## 2023-02-07 DIAGNOSIS — I5031 Acute diastolic (congestive) heart failure: Secondary | ICD-10-CM | POA: Diagnosis not present

## 2023-02-07 DIAGNOSIS — E1169 Type 2 diabetes mellitus with other specified complication: Secondary | ICD-10-CM | POA: Diagnosis present

## 2023-02-07 DIAGNOSIS — I4949 Other premature depolarization: Secondary | ICD-10-CM | POA: Diagnosis present

## 2023-02-07 DIAGNOSIS — I2510 Atherosclerotic heart disease of native coronary artery without angina pectoris: Secondary | ICD-10-CM | POA: Diagnosis present

## 2023-02-07 DIAGNOSIS — I454 Nonspecific intraventricular block: Secondary | ICD-10-CM | POA: Diagnosis present

## 2023-02-07 DIAGNOSIS — Z993 Dependence on wheelchair: Secondary | ICD-10-CM | POA: Diagnosis not present

## 2023-02-07 DIAGNOSIS — I4891 Unspecified atrial fibrillation: Secondary | ICD-10-CM

## 2023-02-07 DIAGNOSIS — Z85828 Personal history of other malignant neoplasm of skin: Secondary | ICD-10-CM

## 2023-02-07 DIAGNOSIS — I7143 Infrarenal abdominal aortic aneurysm, without rupture: Secondary | ICD-10-CM | POA: Diagnosis present

## 2023-02-07 DIAGNOSIS — Z833 Family history of diabetes mellitus: Secondary | ICD-10-CM

## 2023-02-07 DIAGNOSIS — I5030 Unspecified diastolic (congestive) heart failure: Secondary | ICD-10-CM | POA: Diagnosis present

## 2023-02-07 DIAGNOSIS — E663 Overweight: Secondary | ICD-10-CM | POA: Diagnosis not present

## 2023-02-07 DIAGNOSIS — E1122 Type 2 diabetes mellitus with diabetic chronic kidney disease: Secondary | ICD-10-CM | POA: Diagnosis present

## 2023-02-07 DIAGNOSIS — E876 Hypokalemia: Secondary | ICD-10-CM | POA: Diagnosis present

## 2023-02-07 DIAGNOSIS — I480 Paroxysmal atrial fibrillation: Secondary | ICD-10-CM | POA: Diagnosis present

## 2023-02-07 DIAGNOSIS — Z79899 Other long term (current) drug therapy: Secondary | ICD-10-CM

## 2023-02-07 DIAGNOSIS — I10 Essential (primary) hypertension: Secondary | ICD-10-CM | POA: Diagnosis present

## 2023-02-07 DIAGNOSIS — E785 Hyperlipidemia, unspecified: Secondary | ICD-10-CM | POA: Diagnosis not present

## 2023-02-07 DIAGNOSIS — N1831 Chronic kidney disease, stage 3a: Secondary | ICD-10-CM | POA: Diagnosis present

## 2023-02-07 DIAGNOSIS — I482 Chronic atrial fibrillation, unspecified: Secondary | ICD-10-CM

## 2023-02-07 DIAGNOSIS — Z8249 Family history of ischemic heart disease and other diseases of the circulatory system: Secondary | ICD-10-CM

## 2023-02-07 DIAGNOSIS — G20A1 Parkinson's disease without dyskinesia, without mention of fluctuations: Secondary | ICD-10-CM | POA: Diagnosis present

## 2023-02-07 DIAGNOSIS — Z7901 Long term (current) use of anticoagulants: Secondary | ICD-10-CM

## 2023-02-07 DIAGNOSIS — E1165 Type 2 diabetes mellitus with hyperglycemia: Secondary | ICD-10-CM | POA: Diagnosis present

## 2023-02-07 DIAGNOSIS — R7989 Other specified abnormal findings of blood chemistry: Secondary | ICD-10-CM | POA: Diagnosis present

## 2023-02-07 DIAGNOSIS — Z823 Family history of stroke: Secondary | ICD-10-CM

## 2023-02-07 DIAGNOSIS — I4819 Other persistent atrial fibrillation: Secondary | ICD-10-CM | POA: Diagnosis present

## 2023-02-07 DIAGNOSIS — Z7984 Long term (current) use of oral hypoglycemic drugs: Secondary | ICD-10-CM

## 2023-02-07 DIAGNOSIS — Z6825 Body mass index (BMI) 25.0-25.9, adult: Secondary | ICD-10-CM

## 2023-02-07 DIAGNOSIS — E1151 Type 2 diabetes mellitus with diabetic peripheral angiopathy without gangrene: Secondary | ICD-10-CM | POA: Diagnosis present

## 2023-02-07 DIAGNOSIS — M810 Age-related osteoporosis without current pathological fracture: Secondary | ICD-10-CM | POA: Diagnosis present

## 2023-02-07 DIAGNOSIS — I214 Non-ST elevation (NSTEMI) myocardial infarction: Secondary | ICD-10-CM | POA: Diagnosis present

## 2023-02-07 DIAGNOSIS — F32A Depression, unspecified: Secondary | ICD-10-CM | POA: Diagnosis present

## 2023-02-07 DIAGNOSIS — E782 Mixed hyperlipidemia: Secondary | ICD-10-CM | POA: Diagnosis present

## 2023-02-07 DIAGNOSIS — Z8673 Personal history of transient ischemic attack (TIA), and cerebral infarction without residual deficits: Secondary | ICD-10-CM

## 2023-02-07 DIAGNOSIS — Z7985 Long-term (current) use of injectable non-insulin antidiabetic drugs: Secondary | ICD-10-CM

## 2023-02-07 DIAGNOSIS — I5033 Acute on chronic diastolic (congestive) heart failure: Secondary | ICD-10-CM | POA: Diagnosis present

## 2023-02-07 DIAGNOSIS — Z1152 Encounter for screening for COVID-19: Secondary | ICD-10-CM

## 2023-02-07 DIAGNOSIS — I350 Nonrheumatic aortic (valve) stenosis: Secondary | ICD-10-CM | POA: Diagnosis present

## 2023-02-07 DIAGNOSIS — K219 Gastro-esophageal reflux disease without esophagitis: Secondary | ICD-10-CM | POA: Diagnosis present

## 2023-02-07 DIAGNOSIS — G20C Parkinsonism, unspecified: Secondary | ICD-10-CM | POA: Diagnosis present

## 2023-02-07 DIAGNOSIS — Z66 Do not resuscitate: Secondary | ICD-10-CM | POA: Diagnosis present

## 2023-02-07 DIAGNOSIS — F419 Anxiety disorder, unspecified: Secondary | ICD-10-CM | POA: Diagnosis present

## 2023-02-07 LAB — CBC WITH DIFFERENTIAL/PLATELET
Abs Immature Granulocytes: 0.06 10*3/uL (ref 0.00–0.07)
Basophils Absolute: 0.1 10*3/uL (ref 0.0–0.1)
Basophils Relative: 1 %
Eosinophils Absolute: 0.5 10*3/uL (ref 0.0–0.5)
Eosinophils Relative: 6 %
HCT: 34.1 % — ABNORMAL LOW (ref 39.0–52.0)
Hemoglobin: 11.2 g/dL — ABNORMAL LOW (ref 13.0–17.0)
Immature Granulocytes: 1 %
Lymphocytes Relative: 18 %
Lymphs Abs: 1.5 10*3/uL (ref 0.7–4.0)
MCH: 31.9 pg (ref 26.0–34.0)
MCHC: 32.8 g/dL (ref 30.0–36.0)
MCV: 97.2 fL (ref 80.0–100.0)
Monocytes Absolute: 1.2 10*3/uL — ABNORMAL HIGH (ref 0.1–1.0)
Monocytes Relative: 14 %
Neutro Abs: 5 10*3/uL (ref 1.7–7.7)
Neutrophils Relative %: 60 %
Platelets: 206 10*3/uL (ref 150–400)
RBC: 3.51 MIL/uL — ABNORMAL LOW (ref 4.22–5.81)
RDW: 14.5 % (ref 11.5–15.5)
WBC: 8.3 10*3/uL (ref 4.0–10.5)
nRBC: 0 % (ref 0.0–0.2)

## 2023-02-07 LAB — COMPREHENSIVE METABOLIC PANEL
ALT: 12 U/L (ref 0–44)
AST: 25 U/L (ref 15–41)
Albumin: 3.6 g/dL (ref 3.5–5.0)
Alkaline Phosphatase: 54 U/L (ref 38–126)
Anion gap: 11 (ref 5–15)
BUN: 19 mg/dL (ref 8–23)
CO2: 20 mmol/L — ABNORMAL LOW (ref 22–32)
Calcium: 8.5 mg/dL — ABNORMAL LOW (ref 8.9–10.3)
Chloride: 101 mmol/L (ref 98–111)
Creatinine, Ser: 1.25 mg/dL — ABNORMAL HIGH (ref 0.61–1.24)
GFR, Estimated: 54 mL/min — ABNORMAL LOW (ref >60)
Glucose, Bld: 163 mg/dL — ABNORMAL HIGH (ref 70–99)
Potassium: 3.7 mmol/L (ref 3.5–5.1)
Sodium: 132 mmol/L — ABNORMAL LOW (ref 135–145)
Total Bilirubin: 1 mg/dL (ref 0.3–1.2)
Total Protein: 6.9 g/dL (ref 6.5–8.1)

## 2023-02-07 LAB — TROPONIN I (HIGH SENSITIVITY)
Troponin I (High Sensitivity): 169 ng/L (ref <18)
Troponin I (High Sensitivity): 189 ng/L (ref <18)
Troponin I (High Sensitivity): 217 ng/L (ref <18)
Troponin I (High Sensitivity): 267 ng/L (ref <18)

## 2023-02-07 LAB — SARS CORONAVIRUS 2 BY RT PCR: SARS Coronavirus 2 by RT PCR: NEGATIVE

## 2023-02-07 LAB — BRAIN NATRIURETIC PEPTIDE: B Natriuretic Peptide: 910.9 pg/mL — ABNORMAL HIGH (ref 0.0–100.0)

## 2023-02-07 MED ORDER — ENOXAPARIN SODIUM 40 MG/0.4ML IJ SOSY: 40.0000 mg | PREFILLED_SYRINGE | INTRAMUSCULAR | Status: DC

## 2023-02-07 MED ORDER — SODIUM CHLORIDE 0.9 % IV SOLN: 250.0000 mL | INTRAVENOUS | Status: DC | PRN

## 2023-02-07 MED ORDER — SODIUM CHLORIDE 0.9% FLUSH
3.0000 mL | Freq: Two times a day (BID) | INTRAVENOUS | Status: DC
  Administered 2023-02-07 – 2023-02-08 (×3): 3 mL via INTRAVENOUS

## 2023-02-07 MED ORDER — APIXABAN 5 MG PO TABS
5.0000 mg | ORAL_TABLET | Freq: Two times a day (BID) | ORAL | Status: DC
  Administered 2023-02-07 – 2023-02-09 (×5): 5 mg via ORAL
  Filled 2023-02-07 (×5): qty 1

## 2023-02-07 MED ORDER — ACETAMINOPHEN 325 MG PO TABS
650.0000 mg | ORAL_TABLET | Freq: Four times a day (QID) | ORAL | Status: DC | PRN
  Administered 2023-02-07: 650 mg via ORAL
  Filled 2023-02-07: qty 2

## 2023-02-07 MED ORDER — SODIUM CHLORIDE 0.9% FLUSH: 3.0000 mL | INTRAVENOUS | Status: DC | PRN

## 2023-02-07 MED ORDER — FUROSEMIDE 20 MG PO TABS
20.0000 mg | ORAL_TABLET | Freq: Every day | ORAL | Status: DC
  Administered 2023-02-08: 20 mg via ORAL
  Filled 2023-02-07: qty 1

## 2023-02-07 MED ORDER — FUROSEMIDE 40 MG PO TABS
20.0000 mg | ORAL_TABLET | Freq: Once | ORAL | Status: AC
  Administered 2023-02-07: 20 mg via ORAL
  Filled 2023-02-07: qty 1

## 2023-02-07 MED ORDER — ROSUVASTATIN CALCIUM 10 MG PO TABS
10.0000 mg | ORAL_TABLET | Freq: Every day | ORAL | Status: DC
  Administered 2023-02-07 – 2023-02-09 (×3): 10 mg via ORAL
  Filled 2023-02-07 (×3): qty 1

## 2023-02-07 MED ORDER — SERTRALINE HCL 50 MG PO TABS
50.0000 mg | ORAL_TABLET | Freq: Every day | ORAL | Status: DC
  Administered 2023-02-07 – 2023-02-09 (×3): 50 mg via ORAL
  Filled 2023-02-07 (×3): qty 1

## 2023-02-07 MED ORDER — APIXABAN 2.5 MG PO TABS
2.5000 mg | ORAL_TABLET | Freq: Two times a day (BID) | ORAL | Status: DC
  Administered 2023-02-07: 2.5 mg via ORAL
  Filled 2023-02-07: qty 1

## 2023-02-07 MED ORDER — AMIODARONE HCL 200 MG PO TABS
200.0000 mg | ORAL_TABLET | Freq: Two times a day (BID) | ORAL | Status: DC
  Administered 2023-02-07 – 2023-02-09 (×5): 200 mg via ORAL
  Filled 2023-02-07 (×5): qty 1

## 2023-02-07 NOTE — Assessment & Plan Note (Signed)
2D echo January 2024 with a EF of 60 to 65% Worsening orthopnea, PND shortness of breath x 24 hours in the setting of baseline diastolic heart failure as well as severe aortic stenosis BNP 910 Chest ray with cardiomegaly interstitial edema Gentle diuresis as BP tolerates Cardiology consult as clinical indicated

## 2023-02-07 NOTE — Assessment & Plan Note (Addendum)
Followed by Dr. Mariah Milling Likely is contributory to noted dyspnea in setting of acute HFpEF Not a candidate for TAVR Palliative care has been consulted from noted prior admission though family is?  Asking for second opinion Follow

## 2023-02-07 NOTE — Assessment & Plan Note (Signed)
Rate controlled at present Continue Eliquis

## 2023-02-07 NOTE — H&P (Signed)
History and Physical    Patient: Timothy [name] WUJ:[id number] DOB: Nov 13, 1928 DOA: 02/07/2023 DOS: the patient was seen and examined on 02/07/2023 PCP: Timothy Mealing, Timothy [name]  Patient coming from: SNF  Chief Complaint:  Chief Complaint  Patient presents with   Chest Pain   HPI: Timothy [name] is [name] 89 y.o. male with medical history significant of HFpEF, symptomatic severe aortic stenosis, atrial fibrillation, Parkinson's disease, type 2 diabetes presenting with acute HFpEF, NSTEMI, symptomatic or stenosis.  History primarily from family as patient is sleeping.  Per report, patient with increased work of breathing, orthopnea PND over the past 1 to 2 days at facility.  No reported chest pain, nausea or vomiting.  No reported diarrhea.  No hemiparesis.  Baseline chronic severe aortic stenosis followed by Dr. Mariah [name] outpatient.  Family unclear if patient has gotten any doses of his Lasix recently. Presented to the ER afebrile, hemodynamically stable.  White count 8.3, hemoglobin 11.2.  Platelets 206.  Creatinine 1.25.  BNP 911.  Troponin 170.  Chest x-ray with cardiomegaly and interstitial edema. Review of Systems: As mentioned in the history of present illness. All other systems reviewed and are negative. Past Medical History:  Diagnosis Date   (HFpEF) heart failure with preserved ejection fraction (HCC)    [name]. 05/2019 Echo: EF 65-70%, mod LVH. Gr2 DD. Mod AS; b. 08/2020 Echo: EF 65-70%, mod LVH, GrI DD, nl RV fxn, severe AS.   AAA (abdominal aortic aneurysm) (HCC)    [name]. 2014 Abd U/S: 3.5cm AAA; b. 10/20189 Abd U/S: 3.3cm AAA; [name]. 01/2018 CT Abd/Pelvis: 2.7cm infrarenal AAA - rec f/u U/S in 5 yrs.   Actinic keratosis    Back pain    Basal cell carcinoma 08/22/2019   Left mid ear helix. Nodular pattern, not treated pt was sched for [facility] 09/12/19 and canceled notes in [employer], re bx on 03/11/22 bx proven BCC, Mohs 03/26/2022   Basal cell carcinoma 04/30/2020   Left cheek. Superficial and nodular patterns.  excised   Coronary artery calcification seen on CT scan    Esophageal reflux    Essential hypertension    History of stress test    [name]. 01/2012 MV: EF 63%, no rwma. No ischemia/infarct. Low risk.   Hypokalemia 08/01/2022   Mixed hyperlipidemia    Osteitis deformans without mention of bone tumor    Paget's Disease   PAD (peripheral artery disease) (HCC)    [name]. 12/2012 and 05/2013 s/p bilat LE bypass @ [facility] in setting of bilat popliteal aneurysms; b. 05/2019 LE Duplex: R PT 50-74%, Nl L Leg.   Parkinson's disease    Persistent atrial fibrillation (HCC)    [name]. 02/2015 Event monitor: long periods of Afib--prev on flecainide; b. CHA2DS2VASc = 8-->Eliquis 5 bid.   Retinal detachment    Senile osteoporosis    Severe aortic stenosis    [name]. 05/2019 Echo: Mod AS. Mean grad 29.5 mmHg. Peak grad 55.7 mmHg. AVA 1.28 cm^2 (VTI); b. 08/2020 Echo: EF 65-70%, mod LVH, GrI DD, nl RV fxn, triv MR, mild-mod AI. Sev AS (AVA 0.87cm^2, mean grad 40, AoV Vmax 4.88m/s). Mild dil of Ao root.   Skin cancer    Squamous cell carcinoma of skin 04/02/2017   Left cheek. SCCis. Referred for Moh's, clear at appt 06/18/17. Pt opted to treat with 5FU over Mohs.   Swelling of right lower extremity    [name]. 05/2019 LE U/S: No DVT. 50-64% R PT artery stenosis.   TIA (transient ischemic attack)  Type II diabetes mellitus (HCC)    Past Surgical History:  Procedure Laterality Date   APPENDECTOMY     BACK SURGERY     CATARACT EXTRACTION     COLONOSCOPY  2006,2009   ESOPHAGEAL DILATION     GALLBLADDER SURGERY     HERNIA REPAIR     Lower extremity arterial bypass Bilateral    RETINAL DETACHMENT SURGERY     TONSILLECTOMY     Social History:  reports that he has never smoked. He has never used smokeless tobacco. He reports that he does not drink alcohol and does not use drugs.  No Known Allergies  Family History  Problem Relation Age of Onset   Heart disease Mother    Diabetes Father    Retinal detachment Brother    Heart  disease Brother    Stroke Brother    Diabetes Other        grandparents    Prior to Admission medications   Medication Sig Start Date End Date Taking? Authorizing Provider  acetaminophen (TYLENOL) 325 MG tablet Take 2 tablets (650 mg total) by mouth every 6 (six) hours as needed for mild pain or headache (fever >/= 101). 09/22/19   Timothy Santa, Timothy [name]  apixaban (ELIQUIS) 2.5 MG TABS tablet Take 1 tablet (2.5 mg total) by mouth 2 (two) times daily. 10/23/22   Timothy Mealing, Timothy [name]  Calcium Carb-Cholecalciferol (CALCIUM PLUS VITAMIN D) 500-5 MG-MCG TABS Take 1 tablet by mouth daily.    Provider, Historical, Timothy [name]  cyanocobalamin (VITAMIN B12) 1000 MCG/ML injection INJECT 0.5 ML INTRAMUSCULARLY ONE TIME [name] DAY EVERY 1 MONTH STARTING ON THE 28TH FOR 1 DAY FOR SUPPLEMENT 11/27/22   Medina-Vargas, Timothy [name]  diclofenac Sodium (VOLTAREN) 1 % GEL Apply 4 g topically 4 (four) times daily as needed (upper back or rib cage pain). 08/04/22   Timothy Banter, Timothy [name]  docusate sodium (COLACE) 100 MG capsule Take 100 mg by mouth daily. As needed for constipation    Provider, Historical, Timothy [name]  famotidine (PEPCID) 20 MG tablet Take 20 mg by mouth at bedtime. 06/07/20   Provider, Historical, Timothy [name]  FARXIGA 10 MG TABS tablet TAKE 1 TABLET BY MOUTH DAILY 05/07/20   Cannady, Timothy [name]  furosemide (LASIX) 20 MG tablet Take 1 tablet (20 mg total) by mouth daily. Take additional 20 mg AS NEEDED with supper if progressive weight gain or worsening dyspnea 08/05/22   Timothy [name]  glipiZIDE-metformin (METAGLIP) 5-500 MG tablet Take 2 tablets by mouth 2 (two) times daily before [name] meal.    Provider, Historical, Timothy [name]  GLUCOSAMINE-CHONDROITIN PO Take 1 tablet by mouth daily.    Provider, Historical, Timothy [name]  hydrocortisone 2.5 % cream Apply 1-2 times daily to areas at neck, face, scalp as needed for itch. 04/30/20   Timothy Niece, Timothy [name]  Infant Care Products [facility] EX) Apply to penile area topically every shift    Provider, Historical,  Timothy [name]  JANUVIA 100 MG tablet TAKE 1 TABLET BY MOUTH DAILY 05/07/20   Cannady, Timothy [name]  ketoconazole (NIZORAL) 2 % cream once daily to pink scaly patches at face, ears and scalp. 02/05/21   Timothy Niece, Timothy [name]  lidocaine (LIDODERM) 5 % Place 1 patch onto the skin daily. Remove & Discard patch within 12 hours or as directed by Timothy [name] 08/04/22   Timothy [name]  metoprolol succinate (TOPROL-XL) 25 MG 24 hr tablet Take 0.5 tablets (12.5 mg total) by mouth daily. 08/05/22   Timothy Banter,  Timothy [name]  NON FORMULARY CBD 15mg  Capsules: One by mouth once daily    Provider, Historical, Timothy [name]  rosuvastatin (CRESTOR) 10 MG tablet Take 1 tablet (10 mg total) by mouth daily. 04/07/22   Antonieta Iba, Timothy [name]  saccharomyces boulardii (FLORASTOR) 250 MG capsule Take 250 mg by mouth daily.    Provider, Historical, Timothy [name]  sertraline (ZOLOFT) 50 MG tablet Take 1 tablet (50 mg total) by mouth daily. 11/17/19   Aura Dials [name], [name]  Zinc Oxide (TRIPLE PASTE) 12.8 % ointment Apply 1 Application topically 2 (two) times daily as needed for irritation.    Provider, Historical, Timothy [name]    Physical Exam: Vitals:   02/07/23 0730 02/07/23 0800 02/07/23 0830 02/07/23 0930  BP: (!) 100/56 (!) 110/58 99/62 (!) 113/55  Pulse: 79 76 74 77  Resp: (!) 23 (!) 24 (!) 21 18  Temp:      TempSrc:      SpO2: 94% 97%  97%  Weight:      Height:       Physical Exam Constitutional:      Appearance: He is normal weight.  HENT:     Head: Normocephalic and atraumatic.     Nose: Nose normal.     Mouth/Throat:     Mouth: Mucous membranes are moist.  Eyes:     Pupils: Pupils are equal, round, and reactive to light.  Cardiovascular:     Rate and Rhythm: Normal rate and regular rhythm.  Pulmonary:     Effort: Pulmonary effort is normal.  Abdominal:     General: Bowel sounds are normal.  Musculoskeletal:        General: Normal range of motion.  Skin:    General: Skin is warm.  Neurological:     General: No focal deficit present.  Psychiatric:         Mood and Affect: Mood normal.     Data Reviewed:  There are no new results to review at this time. DG Chest Portable 1 View CLINICAL DATA:  Chest pain  EXAM: PORTABLE CHEST 1 VIEW  COMPARISON:  07/28/2022  FINDINGS: Cardiomegaly. Diffuse bilateral interstitial pulmonary opacity. No new or focal airspace opacity. Osseous structures unremarkable.  IMPRESSION: Cardiomegaly with diffuse bilateral interstitial pulmonary opacity, most consistent with edema. No new or focal airspace opacity.  Electronically Signed   By: Jearld Lesch M.D.   On: 02/07/2023 07:28  Lab Results  Component Value Date   WBC 8.3 02/07/2023   HGB 11.2 (L) 02/07/2023   HCT 34.1 (L) 02/07/2023   MCV 97.2 02/07/2023   PLT 206 02/07/2023   Last metabolic panel Lab Results  Component Value Date   GLUCOSE 163 (H) 02/07/2023   NA 132 (L) 02/07/2023   K 3.7 02/07/2023   CL 101 02/07/2023   CO2 20 (L) 02/07/2023   BUN 19 02/07/2023   CREATININE 1.25 (H) 02/07/2023   GFRNONAA 54 (L) 02/07/2023   CALCIUM 8.5 (L) 02/07/2023   PHOS 4.0 09/22/2019   PROT 6.9 02/07/2023   ALBUMIN 3.6 02/07/2023   LABGLOB 2.7 07/30/2022   AGRATIO 1.2 07/30/2022   BILITOT 1.0 02/07/2023   ALKPHOS 54 02/07/2023   AST 25 02/07/2023   ALT 12 02/07/2023   ANIONGAP 11 02/07/2023    Assessment and Plan: * NSTEMI (non-ST elevated myocardial infarction) (HCC) Troponin 169 in the setting of acute diastolic CHF exacerbation EKG sinus rhythm with bundle branch block Suspect demand ischemia in the setting of acute HFpEF ASA Trend troponin Follow   (  HFpEF) heart failure with preserved ejection fraction [facility]) 2D echo January 2024 with [name] EF of 60 to 65% Worsening orthopnea, PND shortness of breath x 24 hours in the setting of baseline diastolic heart failure as well as severe aortic stenosis BNP 910 Chest ray with cardiomegaly interstitial edema Gentle diuresis as BP tolerates Cardiology consult as clinical  indicated  Symptomatic severe aortic stenosis with normal ejection fraction Followed by Dr. Mariah [name] Likely is contributory to noted dyspnea in setting of acute HFpEF Not [name] candidate for TAVR Follow  Type 2 diabetes mellitus with hyperlipidemia (HCC) SSI   Paroxysmal atrial fibrillation (HCC) Rate controlled at present Continue Eliquis  Essential hypertension Lower limit of normal blood pressure on presentation Titrate BP meds for now Follow      Advance Care Planning:   Code Status: DNR   Consults: Consider cardiology consult   Family Communication: Daughters at the bedside   Severity of Illness: The appropriate patient status for this patient is OBSERVATION. Observation status is judged to be reasonable and necessary in order to provide the required intensity of service to ensure the patient's safety. The patient's presenting symptoms, physical exam findings, and initial radiographic and laboratory data in the context of their medical condition is felt to place them at decreased risk for further clinical deterioration. Furthermore, it is anticipated that the patient will be medically stable for discharge from the hospital within 2 midnights of admission.   Author: Floydene Flock, Timothy [name] 02/07/2023 9:44 AM  For on call review [email or web address].

## 2023-02-07 NOTE — Assessment & Plan Note (Signed)
SSI

## 2023-02-07 NOTE — Telephone Encounter (Signed)
Received phone call from nursing home as on call provider.   Timothy [name] is a 89 yo patient w/ hx of CHF, DM, anxiety, severe aortic stenosis, Parkinson's Disease who reports he has chest pain that feels like a heart attack in the middle o fhis chest. 6-7/10. Patient has had demand ischemia with CHF exacerbations in the past. At this time does not radiate.per nursing. O2 dropped to 87 and is on 2L [place]. Due to Parkinson's disease he is difficult to understand, but has been cognitively intact at the facility.   Patient has a DNR, but does not have a DNH.

## 2023-02-07 NOTE — Progress Notes (Signed)
Per nursing:  "hey, pts HR is not 130s and BP 74/60, he is alert and no change in mental status, does report some chest discomfort" Repeat manual BP 98/70 EKG w/ afib w/ RVR   Repeat trop pending  Will formally consult cardiology  Consider IV amiodarone pending cardiology consult  Consider transition to heparin gtt  Follow

## 2023-02-07 NOTE — ED Provider Notes (Signed)
[facility] Provider Note    Event Date/Time   First MD Initiated Contact with Patient 02/07/23 [phone]     (approximate)   History   Chest Pain   HPI  Timothy [name] is a 89 y.o. male   extensive cardiac history including Parkinson's, AAA, heart failure, paroxysmal A-fib, severe aortic stenosis presents to the ER for evaluation of episode of chest pain.  Currently denying any chest discomfort, pressure, pain, dyspnea      Physical Exam   Triage Vital Signs: ED Triage Vitals  Encounter Vitals Group     BP 02/07/23 0656 114/65     Systolic BP Percentile --      Diastolic BP Percentile --      Pulse Rate 02/07/23 0656 87     Resp 02/07/23 0658 (!) 28     Temp 02/07/23 0658 99.4 F (37.4 C)     Temp Source 02/07/23 0658 Oral     SpO2 02/07/23 0656 95 %     Weight 02/07/23 0654 171 lb (77.6 kg)     Height 02/07/23 0654 5\' 9"  (1.753 m)     Head Circumference --      Peak Flow --      Pain Score 02/07/23 0654 4     Pain Loc --      Pain Education --      Exclude from Growth Chart --     Most recent vital signs: Vitals:   02/07/23 0716 02/07/23 0730  BP:  (!) 100/56  Pulse: 81 79  Resp: 17 (!) 23  Temp: 98.8 F (37.1 C)   SpO2: 96% 94%     Constitutional: Alert  Eyes: Conjunctivae are normal.  Head: Atraumatic. Nose: No congestion/rhinnorhea. Mouth/Throat: Mucous membranes are moist.   Neck: Painless ROM.  Cardiovascular:   Good peripheral circulation. Blowing systolic murmur Respiratory: Normal respiratory effort.  No retractions.  Gastrointestinal: Soft and nontender.  Musculoskeletal:  no deformity Neurologic:  MAE spontaneously. No gross focal neurologic deficits are appreciated.  Skin:  Skin is warm, dry and intact. No rash noted. Psychiatric: Mood and affect are normal. Speech and behavior are normal.    ED Results / Procedures / Treatments   Labs (all labs ordered are listed, but only abnormal results are displayed) Labs  Reviewed  CBC WITH DIFFERENTIAL/PLATELET - Abnormal; Notable for the following components:      Result Value   RBC 3.51 (*)    Hemoglobin 11.2 (*)    HCT 34.1 (*)    Monocytes Absolute 1.2 (*)    All other components within normal limits  COMPREHENSIVE METABOLIC PANEL - Abnormal; Notable for the following components:   Sodium 132 (*)    CO2 20 (*)    Glucose, Bld 163 (*)    Creatinine, Ser 1.25 (*)    Calcium 8.5 (*)    GFR, Estimated 54 (*)    All other components within normal limits  TROPONIN I (HIGH SENSITIVITY) - Abnormal; Notable for the following components:   Troponin I (High Sensitivity) 169 (*)    All other components within normal limits  SARS CORONAVIRUS 2 BY RT PCR  BRAIN NATRIURETIC PEPTIDE     EKG  ED ECG REPORT I, Willy Eddy, the attending physician, personally viewed and interpreted this ECG.   Date: 02/07/2023  EKG Time: 7:04  Rate: 85  Rhythm: sinus  Axis: left  Intervals:lbbb  ST&T Change: no stemi, no depressions    RADIOLOGY Please  see ED Course for my review and interpretation.  I personally reviewed all radiographic images ordered to evaluate for the above acute complaints and reviewed radiology reports and findings.  These findings were personally discussed with the patient.  Please see medical record for radiology report.    PROCEDURES:  Critical Care performed: No  Procedures   MEDICATIONS ORDERED IN ED: Medications  furosemide (LASIX) tablet 20 mg (20 mg Oral Given 02/07/23 0745)     IMPRESSION / MDM / ASSESSMENT AND PLAN / ED COURSE  I reviewed the triage vital signs and the nursing notes.                              Differential diagnosis includes, but is not limited to, ACS, pericarditis, esophagitis, boerhaaves, pe, dissection, pna, bronchitis, costochondritis  Patient presenting to the ER for evaluation of symptoms as described above.  Based on symptoms, risk factors and considered above differential, this  presenting complaint could reflect a potentially life-threatening illness therefore the patient will be placed on continuous pulse oximetry and telemetry for monitoring.  Laboratory evaluation will be sent to evaluate for the above complaints.  Patient with severe aortic stenosis as well as congestive heart failure presenting with chest pain with elevated troponin.  His EKG without evidence of acute ischemia.  Not consistent with PE.  Chest x-ray on my review and interpretation does show evidence of some edema no significant effusion.  Will give his morning p.o. Lasix as I do not want to drop his pressures with IV Lasix.  He is already on Eliquis.  Was given aspirin prior to arrival.  Will consult hospitalist for admission.        FINAL CLINICAL IMPRESSION(S) / ED DIAGNOSES   Final diagnoses:  Acute on chronic diastolic CHF (congestive heart failure) (HCC)     Rx / DC Orders   ED Discharge Orders     None        Note:  This document was prepared using Dragon voice recognition software and may include unintentional dictation errors.    Willy Eddy, MD 02/07/23 [phone]

## 2023-02-07 NOTE — Plan of Care (Signed)

## 2023-02-07 NOTE — ED Triage Notes (Signed)
Pt BIB [facility] for CP starting @ 0600, 5/10, non-radiating. BG 168, VSS, hx proximal a fib. VSS stable for VSS, hx CHF.

## 2023-02-07 NOTE — Consult Note (Signed)
Cardiology Consultation   Patient ID: Timothy [name] MRN: [id number]; DOB: 12/20/1928  Admit date: 02/07/2023 Date of Consult: 02/07/2023  PCP:  Earnestine Mealing, MD   [facility] Providers Cardiologist:  Julien Nordmann, MD        Patient Profile:   Timothy [name] is a 89 y.o. male with a hx of severe aortic stenosis, paroxysmal atrial fibrillation who is being seen 02/07/2023 for the evaluation of A-fib RVR at the request of Dr. Alvester Morin.  History of Present Illness:   Timothy [name] is a 89 year old gentleman with history of severe aortic stenosis, paroxysmal atrial fibrillation, HFpEF presenting with worsening chest pressure and shortness of breath.  Patient lives in a skilled nursing facility, noticed some chest pressure and shortness of breath today prompting him to come to the emergency room per EMS.  Supposed to be on oral Lasix 20 mg daily, states taking all medications as prescribed as per his knowledge.  In the ED on admission, patient noted to be hypotensive with heart rates in the 120s.  EKG showed A-fib with RVR.  While in the ED, daughter had patient hold a cool cloth with spontaneous conversion to sinus rhythm.  Chest x-ray showed pulmonary edema, BNP 910.  Given oral Lasix with some diuresis in the ED, although not measured.   Past Medical History:  Diagnosis Date   (HFpEF) heart failure with preserved ejection fraction (HCC)    a. 05/2019 Echo: EF 65-70%, mod LVH. Gr2 DD. Mod AS; b. 08/2020 Echo: EF 65-70%, mod LVH, GrI DD, nl RV fxn, severe AS.   AAA (abdominal aortic aneurysm) (HCC)    a. 2014 Abd U/S: 3.5cm AAA; b. 10/20189 Abd U/S: 3.3cm AAA; c. 01/2018 CT Abd/Pelvis: 2.7cm infrarenal AAA - rec f/u U/S in 5 yrs.   Actinic keratosis    Back pain    Basal cell carcinoma 08/22/2019   Left mid ear helix. Nodular pattern, not treated pt was sched for [facility] 09/12/19 and canceled notes in [employer], re bx on 03/11/22 bx proven BCC, Mohs 03/26/2022   Basal cell carcinoma  04/30/2020   Left cheek. Superficial and nodular patterns. excised   Coronary artery calcification seen on CT scan    Esophageal reflux    Essential hypertension    History of stress test    a. 01/2012 MV: EF 63%, no rwma. No ischemia/infarct. Low risk.   Hypokalemia 08/01/2022   Mixed hyperlipidemia    Osteitis deformans without mention of bone tumor    Paget's Disease   PAD (peripheral artery disease) (HCC)    a. 12/2012 and 05/2013 s/p bilat LE bypass @ [facility] in setting of bilat popliteal aneurysms; b. 05/2019 LE Duplex: R PT 50-74%, Nl L Leg.   Parkinson's disease    Persistent atrial fibrillation (HCC)    a. 02/2015 Event monitor: long periods of Afib--prev on flecainide; b. CHA2DS2VASc = 8-->Eliquis 5 bid.   Retinal detachment    Senile osteoporosis    Severe aortic stenosis    a. 05/2019 Echo: Mod AS. Mean grad 29.5 mmHg. Peak grad 55.7 mmHg. AVA 1.28 cm^2 (VTI); b. 08/2020 Echo: EF 65-70%, mod LVH, GrI DD, nl RV fxn, triv MR, mild-mod AI. Sev AS (AVA 0.87cm^2, mean grad 40, AoV Vmax 4.69m/s). Mild dil of Ao root.   Skin cancer    Squamous cell carcinoma of skin 04/02/2017   Left cheek. SCCis. Referred for Moh's, clear at appt 06/18/17. Pt opted to treat with 5FU over Mohs.  Swelling of right lower extremity    a. 05/2019 LE U/S: No DVT. 50-64% R PT artery stenosis.   TIA (transient ischemic attack)    Type II diabetes mellitus (HCC)     Past Surgical History:  Procedure Laterality Date   APPENDECTOMY     BACK SURGERY     CATARACT EXTRACTION     COLONOSCOPY  2006,2009   ESOPHAGEAL DILATION     GALLBLADDER SURGERY     HERNIA REPAIR     Lower extremity arterial bypass Bilateral    RETINAL DETACHMENT SURGERY     TONSILLECTOMY       Home Medications:  Prior to Admission medications   Medication Sig Start Date End Date Taking? Authorizing Provider  acetaminophen (TYLENOL) 325 MG tablet Take 2 tablets (650 mg total) by mouth every 6 (six) hours as needed for mild pain or  headache (fever >/= 101). 09/22/19  Yes Gillis Santa, MD  apixaban (ELIQUIS) 2.5 MG TABS tablet Take 1 tablet (2.5 mg total) by mouth 2 (two) times daily. 10/23/22  Yes Beamer, Benetta Spar, MD  Calcium Carb-Cholecalciferol (CALCIUM PLUS VITAMIN D) 500-5 MG-MCG TABS Take 1 tablet by mouth daily.   Yes [provider]  CANNABIDIOL PO Take 15 mg by mouth daily.   Yes [provider]  chlorhexidine (PERIDEX) 0.12 % solution Use as directed 15 mLs in the mouth or throat 2 (two) times daily. 02/05/23 02/12/23 Yes [provider]  diclofenac Sodium (VOLTAREN) 1 % GEL Apply 4 g topically 4 (four) times daily as needed (upper back or rib cage pain). Patient taking differently: Apply 4 g topically every 6 (six) hours as needed (upper back or rib cage pain). 08/04/22  Yes Esaw Grandchild A, DO  docusate sodium (COLACE) 100 MG capsule Take 100 mg by mouth daily as needed for mild constipation.   Yes [provider]  famotidine (PEPCID) 20 MG tablet Take 20 mg by mouth at bedtime.   Yes [provider]  FARXIGA 10 MG TABS tablet TAKE 1 TABLET BY MOUTH DAILY 05/07/20  Yes Cannady, Jolene T, NP  furosemide (LASIX) 20 MG tablet Take 1 tablet (20 mg total) by mouth daily. Take additional 20 mg AS NEEDED with supper if progressive weight gain or worsening dyspnea 08/05/22  Yes Esaw Grandchild A, DO  glipiZIDE-metformin (METAGLIP) 5-500 MG tablet Take 2 tablets by mouth 2 (two) times daily before a meal.   Yes [provider]  GLUCOSAMINE-CHONDROITIN PO Take 1 tablet by mouth at bedtime.   Yes [provider]  JANUVIA 100 MG tablet TAKE 1 TABLET BY MOUTH DAILY Patient taking differently: at bedtime. 05/07/20  Yes Cannady, Jolene T, NP  lidocaine (LIDODERM) 5 % Place 1 patch onto the skin daily. Remove & Discard patch within 12 hours or as directed by MD Patient taking differently: Place 1 patch onto the skin daily as needed (pain). Remove & Discard patch within 12  hours or as directed by MD 08/04/22  Yes Esaw Grandchild A, DO  metoprolol succinate (TOPROL-XL) 25 MG 24 hr tablet Take 0.5 tablets (12.5 mg total) by mouth daily. 08/05/22  Yes Esaw Grandchild A, DO  rosuvastatin (CRESTOR) 10 MG tablet Take 1 tablet (10 mg total) by mouth daily. 04/07/22  Yes Gollan, Tollie Pizza, MD  saccharomyces boulardii (FLORASTOR) 250 MG capsule Take 1 capsule by mouth daily.   Yes [provider]  sertraline (ZOLOFT) 50 MG tablet Take 1 tablet (50 mg total) by mouth daily. Patient taking  differently: Take 50 mg by mouth at bedtime. 11/17/19  Yes Cannady, Corrie Dandy T, NP  Zinc Oxide (TRIPLE PASTE) 12.8 % ointment Apply 1 Application topically 2 (two) times daily. (Apply to peri area and skin folds)   Yes [provider]  cyanocobalamin (VITAMIN B12) 1000 MCG/ML injection INJECT 0.5 ML INTRAMUSCULARLY ONE TIME A DAY EVERY 1 MONTH STARTING ON THE 28TH FOR 1 DAY FOR SUPPLEMENT Patient not taking: Reported on 02/07/2023 11/27/22   Medina-Vargas, Monina C, NP  ketoconazole (NIZORAL) 2 % cream once daily to pink scaly patches at face, ears and scalp. Patient not taking: Reported on 02/07/2023 02/05/21   Willeen Niece, MD    Inpatient Medications: Scheduled Meds:  amiodarone  200 mg Oral BID   apixaban  2.5 mg Oral BID   furosemide  20 mg Oral Daily   rosuvastatin  10 mg Oral Daily   sertraline  50 mg Oral Daily   sodium chloride flush  3 mL Intravenous Q12H   Continuous Infusions:  sodium chloride     PRN Meds: sodium chloride, acetaminophen, sodium chloride flush  Allergies:   No Known Allergies  Social History:   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  Tobacco Use   Smoking status: Never   Smokeless tobacco: Never  Vaping Use   Vaping status:  Never Used  Substance and Sexual Activity   Alcohol use: No   Drug use: No   Sexual activity: Not Currently  Other Topics Concern   Not on file  Social History Narrative   Not on file   Social Determinants of Health   Financial Resource Strain: Not on file  Food Insecurity: No Food Insecurity (07/30/2022)   Hunger Vital Sign    Worried About Running Out of Food in the Last Year: Never true    Ran Out of Food in the Last Year: Never true  Transportation Needs: No Transportation Needs (07/30/2022)   PRAPARE - Administrator, Civil Service (Medical): No    Lack of Transportation (Non-Medical): No  Physical Activity: Not on file  Stress: Not on file  Social Connections: Not on file  Intimate Partner Violence: Not At Risk (07/30/2022)   Humiliation, Afraid, Rape, and Kick questionnaire    Fear of Current or Ex-Partner: No    Emotionally Abused: No    Physically Abused: No    Sexually Abused: No    Family History:    Family History  Problem Relation Age of Onset   Heart disease Mother    Diabetes Father    Retinal detachment Brother    Heart disease Brother    Stroke Brother    Diabetes Other        grandparents     ROS:  Please see the history of present illness.   All other ROS reviewed and negative.     Physical Exam/Data:   Vitals:   02/07/23 1300 02/07/23 1315 02/07/23 1330 02/07/23 1400  BP: (!) 99/55 104/69 (!) 104/58 109/66  Pulse: 81 (!) 104 86 84  Resp: (!) 22 18 11 20   Temp:    97.9 F (36.6 C)  TempSrc:      SpO2: 100% 99% 98% 99%  Weight:      Height:  Intake/Output Summary (Last 24 hours) at 02/07/2023 1443 Last data filed at 02/07/2023 1125 Gross per 24 hour  Intake --  Output 300 ml  Net -300 ml      02/07/2023    6:54 AM 01/29/2023   10:46 AM 01/27/2023    2:25 PM  Last 3 Weights  Weight (lbs) 171 lb 170 lb 6.4 oz 172 lb  Weight (kg) 77.565 kg 77.293 kg 78.019 kg     Body mass index is 25.25 kg/m.  General:  Well  nourished, soft-spoken, no acute distress HEENT: normal Neck: no JVD Vascular: No carotid bruits; Distal pulses 2+ bilaterally Cardiac:  normal S1, S2; RRR; 3/6 systolic murmur Lungs: Diminished breath sounds bilaterally Abd: soft, nontender, no hepatomegaly  Ext: Trace edema Musculoskeletal:  No deformities, BUE and BLE strength normal and equal Skin: warm and dry  Neuro:  CNs 2-12 intact, no focal abnormalities noted Psych:  Normal affect   EKG:  The EKG was personally reviewed and demonstrates: A-fib, heart rate 120 Telemetry:  Telemetry was personally reviewed and demonstrates: Sinus rhythm, heart rate 84  Relevant CV Studies: TTE 07/2022 1. Left ventricular ejection fraction, by estimation, is 60 to 65%. The  left ventricle has normal function. The left ventricle has no regional  wall motion abnormalities. There is moderate left ventricular hypertrophy.  Left ventricular diastolic  parameters are indeterminate.   2. Right ventricular systolic function is normal. The right ventricular  size is normal. Tricuspid regurgitation signal is inadequate for assessing  PA pressure.   3. The mitral valve is normal in structure. Moderate mitral valve  regurgitation. No evidence of mitral stenosis.   4. The aortic valve is calcified. There is severe calcifcation of the  aortic valve. There is severe thickening of the aortic valve. Aortic valve  regurgitation is mild. Severe aortic valve stenosis. Aortic regurgitation  PHT measures 477 msec. Aortic  valve area, by VTI measures 0.43 cm. Aortic valve mean gradient measures  29.0 mmHg.   Laboratory Data:  High Sensitivity Troponin:   Recent Labs  Lab 02/07/23 0700 02/07/23 0929 02/07/23 1213  TROPONINIHS 169* 189* 217*     Chemistry Recent Labs  Lab 02/07/23 0700  NA 132*  K 3.7  CL 101  CO2 20*  GLUCOSE 163*  BUN 19  CREATININE 1.25*  CALCIUM 8.5*  GFRNONAA 54*  ANIONGAP 11    Recent Labs  Lab 02/07/23 0700  PROT  6.9  ALBUMIN 3.6  AST 25  ALT 12  ALKPHOS 54  BILITOT 1.0   Lipids No results for input(s): "CHOL", "TRIG", "HDL", "LABVLDL", "LDLCALC", "CHOLHDL" in the last 168 hours.  Hematology Recent Labs  Lab 02/07/23 0700  WBC 8.3  RBC 3.51*  HGB 11.2*  HCT 34.1*  MCV 97.2  MCH 31.9  MCHC 32.8  RDW 14.5  PLT 206   Thyroid No results for input(s): "TSH", "FREET4" in the last 168 hours.  BNP Recent Labs  Lab 02/07/23 0700  BNP 910.9*    DDimer No results for input(s): "DDIMER" in the last 168 hours.   Radiology/Studies:  DG Chest Portable 1 View  Result Date: 02/07/2023 CLINICAL DATA:  Chest pain EXAM: PORTABLE CHEST 1 VIEW COMPARISON:  07/28/2022 FINDINGS: Cardiomegaly. Diffuse bilateral interstitial pulmonary opacity. No new or focal airspace opacity. Osseous structures unremarkable. IMPRESSION: Cardiomegaly with diffuse bilateral interstitial pulmonary opacity, most consistent with edema. No new or focal airspace opacity. Electronically Signed   By: Jearld Lesch M.D.   On: 02/07/2023  07:28     Assessment and Plan:   A-fib RVR -Spontaneous conversion in ED after holding cold cloth -Start amiodarone p.o. 200 mg twice daily -Continue PTA Eliquis 2.5 mg twice daily  2.  Severe AS -Not candidate for intervention -PTA Lasix 20 mg daily  3.  HFpEF -Possibly exacerbated by A-fib RVR -Amio for A-fib control -Lasix 20 mg daily  Total encounter time more than 80 minutes  Greater than 50% was spent in counseling and coordination of care with the patient and daughter at bedside    Signed, Debbe Odea, MD  02/07/2023 2:43 PM

## 2023-02-07 NOTE — Assessment & Plan Note (Deleted)
2D echo January 2024 with a EF of 60 to 65% Worsening orthopnea, PND shortness of breath x 24 hours in the setting of baseline diastolic heart failure as well as severe aortic stenosis BNP 910 Chest ray with cardiomegaly interstitial edema Gentle diuresis as BP tolerates Cardiology consult as clinical indicated

## 2023-02-07 NOTE — Assessment & Plan Note (Deleted)
Lower limit of normal blood pressure on presentation Titrate BP meds for now Follow

## 2023-02-08 DIAGNOSIS — E663 Overweight: Secondary | ICD-10-CM | POA: Insufficient documentation

## 2023-02-08 DIAGNOSIS — E876 Hypokalemia: Secondary | ICD-10-CM

## 2023-02-08 DIAGNOSIS — I350 Nonrheumatic aortic (valve) stenosis: Secondary | ICD-10-CM

## 2023-02-08 DIAGNOSIS — I4891 Unspecified atrial fibrillation: Secondary | ICD-10-CM | POA: Diagnosis not present

## 2023-02-08 DIAGNOSIS — E785 Hyperlipidemia, unspecified: Secondary | ICD-10-CM

## 2023-02-08 DIAGNOSIS — E1169 Type 2 diabetes mellitus with other specified complication: Secondary | ICD-10-CM

## 2023-02-08 DIAGNOSIS — I259 Chronic ischemic heart disease, unspecified: Secondary | ICD-10-CM | POA: Diagnosis not present

## 2023-02-08 DIAGNOSIS — I5033 Acute on chronic diastolic (congestive) heart failure: Secondary | ICD-10-CM

## 2023-02-08 DIAGNOSIS — G20C Parkinsonism, unspecified: Secondary | ICD-10-CM

## 2023-02-08 DIAGNOSIS — N1831 Chronic kidney disease, stage 3a: Secondary | ICD-10-CM

## 2023-02-08 LAB — COMPREHENSIVE METABOLIC PANEL WITH GFR
ALT: 13 U/L (ref 0–44)
AST: 22 U/L (ref 15–41)
Albumin: 3.8 g/dL (ref 3.5–5.0)
Alkaline Phosphatase: 54 U/L (ref 38–126)
Anion gap: 11 (ref 5–15)
BUN: 20 mg/dL (ref 8–23)
CO2: 24 mmol/L (ref 22–32)
Calcium: 8.4 mg/dL — ABNORMAL LOW (ref 8.9–10.3)
Chloride: 100 mmol/L (ref 98–111)
Creatinine, Ser: 1.18 mg/dL (ref 0.61–1.24)
GFR, Estimated: 58 mL/min — ABNORMAL LOW (ref >60)
Glucose, Bld: 169 mg/dL — ABNORMAL HIGH (ref 70–99)
Potassium: 3.3 mmol/L — ABNORMAL LOW (ref 3.5–5.1)
Sodium: 135 mmol/L (ref 135–145)
Total Bilirubin: 1.4 mg/dL — ABNORMAL HIGH (ref 0.3–1.2)
Total Protein: 6.9 g/dL (ref 6.5–8.1)

## 2023-02-08 LAB — CBC
HCT: 33.9 % — ABNORMAL LOW (ref 39.0–52.0)
Hemoglobin: 11.5 g/dL — ABNORMAL LOW (ref 13.0–17.0)
MCH: 32.1 pg (ref 26.0–34.0)
MCHC: 33.9 g/dL (ref 30.0–36.0)
MCV: 94.7 fL (ref 80.0–100.0)
Platelets: 193 10*3/uL (ref 150–400)
RBC: 3.58 MIL/uL — ABNORMAL LOW (ref 4.22–5.81)
RDW: 14.6 % (ref 11.5–15.5)
WBC: 9.5 10*3/uL (ref 4.0–10.5)
nRBC: 0 % (ref 0.0–0.2)

## 2023-02-08 LAB — GLUCOSE, CAPILLARY
Glucose-Capillary: 255 mg/dL — ABNORMAL HIGH (ref 70–99)
Glucose-Capillary: 170 mg/dL — ABNORMAL HIGH (ref 70–99)

## 2023-02-08 MED ORDER — POTASSIUM CHLORIDE CRYS ER 20 MEQ PO TBCR
40.0000 meq | EXTENDED_RELEASE_TABLET | Freq: Once | ORAL | Status: AC
  Administered 2023-02-08: 40 meq via ORAL
  Filled 2023-02-08: qty 2

## 2023-02-08 MED ORDER — DAPAGLIFLOZIN PROPANEDIOL 10 MG PO TABS
10.0000 mg | ORAL_TABLET | Freq: Every day | ORAL | Status: DC
  Administered 2023-02-09: 10 mg via ORAL
  Filled 2023-02-08: qty 1

## 2023-02-08 MED ORDER — FUROSEMIDE 10 MG/ML IJ SOLN
20.0000 mg | Freq: Every day | INTRAMUSCULAR | Status: DC
  Administered 2023-02-08 – 2023-02-09 (×2): 20 mg via INTRAVENOUS
  Filled 2023-02-08 (×2): qty 2

## 2023-02-08 MED ORDER — INSULIN ASPART 100 UNIT/ML IJ SOLN
0.0000 [IU] | Freq: Three times a day (TID) | INTRAMUSCULAR | Status: DC
  Administered 2023-02-08: 3 [IU] via SUBCUTANEOUS
  Administered 2023-02-09: 1 [IU] via SUBCUTANEOUS
  Administered 2023-02-09: 2 [IU] via SUBCUTANEOUS
  Administered 2023-02-09: 1 [IU] via SUBCUTANEOUS
  Filled 2023-02-08 (×4): qty 1

## 2023-02-08 MED ORDER — INSULIN ASPART 100 UNIT/ML IJ SOLN: 0.0000 [IU] | Freq: Every day | INTRAMUSCULAR | Status: DC

## 2023-02-08 NOTE — Hospital Course (Signed)
89 y.o. male with medical history significant of HFpEF, symptomatic severe aortic stenosis, atrial fibrillation, Parkinson's disease, type 2 diabetes presenting with acute HFpEF, NSTEMI, symptomatic or stenosis.  History primarily from family as patient is sleeping.  Per report, patient with increased work of breathing, orthopnea PND over the past 1 to 2 days at facility.  No reported chest pain, nausea or vomiting.  No reported diarrhea.  No hemiparesis.  Baseline chronic severe aortic stenosis followed by Dr. Mariah Milling outpatient.  Family unclear if patient has gotten any doses of his Lasix recently. Presented to the ER afebrile, hemodynamically stable.  White count 8.3, hemoglobin 11.2.  Platelets 206.  Creatinine 1.25.  BNP 911.  Troponin 170.  Chest x-ray with cardiomegaly and interstitial edema.  8/4.  Patient not feeling well with palpitations and feeling sluggish and some right upper chest pain.  Heart rate in the 120s this morning when I saw him sinus tachycardia. 8/5.  Patient not sure how he is feeling.  Not complaining of chest pain or palpitations.  Some shortness of breath.  Case discussed with cardiology and recommended second opinion for aortic valve replacement at [facility].  Patient accepted to [facility] but on the wait list.

## 2023-02-08 NOTE — Assessment & Plan Note (Signed)
BMI listed as 25.25.  Not sure how accurate this is.

## 2023-02-08 NOTE — NC FL2 (Signed)
[place] MEDICAID FL2 LEVEL OF CARE FORM     IDENTIFICATION  Patient Name: Timothy [name] Birthdate: Apr 29, 1929 Sex: male Admission Date (Current Location): 02/07/2023  [facility] and [place] Number:  Chiropodist and Address:  [facility], [address], [place], [place] [address]      Provider Number: [id number]  Attending Physician Name and Address:  Alford Highland, MD  Relative Name and Phone Number:  Beryle Beams (Daughter)  [phone] (Mobile)    Current Level of Care: Hospital Recommended Level of Care: Skilled Nursing Facility Prior Approval Number:    Date Approved/Denied:   PASRR Number: [id number]  Discharge Plan:      Current Diagnoses: Patient Active Problem List   Diagnosis Date Noted   Myocardial ischemia 02/08/2023   CKD stage 3a, GFR 45-59 ml/min (HCC) 02/08/2023   Overweight (BMI 25.0-29.9) 02/08/2023   Atrial fibrillation with rapid ventricular response (HCC) 02/07/2023   Acute on chronic diastolic CHF (congestive heart failure) (HCC) 02/07/2023   History of basal cell carcinoma (BCC) of skin 09/10/2022   Hyponatremia 08/01/2022   Hypokalemia 08/01/2022   Acute pulmonary edema (HCC) 07/31/2022   Parkinsonian features 07/29/2022   Debility 07/29/2022   AKI (acute kidney injury) (HCC) 07/29/2022   Myocardial injury 07/29/2022   Right-sided chest pain 07/28/2022   Acute respiratory failure with hypoxia (HCC)    Pneumonia due to COVID-19 virus 09/18/2019   PAD (peripheral artery disease) (HCC)    Moderate aortic stenosis    AAA (abdominal aortic aneurysm) (HCC)    Type 2 diabetes mellitus with hyperlipidemia (HCC)    Parkinsonism 06/14/2018   Behavioral change 03/25/2018   Symptomatic severe aortic stenosis with normal ejection fraction 02/18/2018   Dysphagia 12/10/2017   Advanced care planning/counseling discussion 11/27/2016   Pernicious anemia 05/28/2015   Leg weakness, bilateral 04/12/2015   Tremor  03/27/2015   Chronic anxiety 02/20/2015   SVT (supraventricular tachycardia) 02/16/2015   History of back surgery 02/16/2015   TIA (transient ischemic attack) 12/29/2011   Hyperlipidemia associated with type 2 diabetes mellitus (HCC) 09/24/2011    Orientation RESPIRATION BLADDER Height & Weight        O2 Continent Weight: 171 lb (77.6 kg) Height:  5\' 9"  (175.3 cm)  BEHAVIORAL SYMPTOMS/MOOD NEUROLOGICAL BOWEL NUTRITION STATUS      Continent Diet (Diet heart healthy/carb modified Room service appropriate? Yes; Fluid consistency: Thin; Fluid restriction: 1200 mL Fluid)  AMBULATORY STATUS COMMUNICATION OF NEEDS Skin   Limited Assist Verbally  (redness)                       Personal Care Assistance Level of Assistance  Bathing, Feeding, Dressing Bathing Assistance: Limited assistance Feeding assistance: Limited assistance Dressing Assistance: Limited assistance     Functional Limitations Info  Sight, Hearing Sight Info: Impaired Hearing Info: Impaired      SPECIAL CARE FACTORS FREQUENCY  PT (By licensed PT), OT (By licensed OT)     PT Frequency: 5 times per week OT Frequency: 5 times per week            Contractures      Additional Factors Info  Code Status, Allergies Code Status Info: DNR Allergies Info: NKA           Current Medications (02/08/2023):  This is the current hospital active medication list Current Facility-Administered Medications  Medication Dose Route Frequency Provider Last Rate Last Admin   0.9 %  sodium chloride infusion  250 mL Intravenous PRN Floydene Flock, MD       acetaminophen (TYLENOL) tablet 650 mg  650 mg Oral Q6H PRN Floydene Flock, MD   650 mg at 02/07/23 2134   amiodarone (PACERONE) tablet 200 mg  200 mg Oral BID Debbe Odea, MD   200 mg at 02/08/23 0851   apixaban (ELIQUIS) tablet 5 mg  5 mg Oral BID Floydene Flock, MD   5 mg at 02/08/23 0851   [START ON 02/09/2023] dapagliflozin propanediol (FARXIGA) tablet 10 mg   10 mg Oral Daily Wieting, Richard, MD       furosemide (LASIX) injection 20 mg  20 mg Intravenous Daily Alford Highland, MD   20 mg at 02/08/23 1153   insulin aspart (novoLOG) injection 0-5 Units  0-5 Units Subcutaneous QHS Wieting, Richard, MD       insulin aspart (novoLOG) injection 0-6 Units  0-6 Units Subcutaneous TID WC Wieting, Richard, MD       rosuvastatin (CRESTOR) tablet 10 mg  10 mg Oral Daily Floydene Flock, MD   10 mg at 02/08/23 0851   sertraline (ZOLOFT) tablet 50 mg  50 mg Oral Daily Floydene Flock, MD   50 mg at 02/08/23 0851   sodium chloride flush (NS) 0.9 % injection 3 mL  3 mL Intravenous Q12H Floydene Flock, MD   3 mL at 02/08/23 1153   sodium chloride flush (NS) 0.9 % injection 3 mL  3 mL Intravenous PRN Floydene Flock, MD         Discharge Medications: Please see discharge summary for a list of discharge medications.  Relevant Imaging Results:  Relevant Lab Results:   Additional Information SS #: [id number]   E , LCSW

## 2023-02-08 NOTE — Assessment & Plan Note (Signed)
Replaced 

## 2023-02-08 NOTE — Evaluation (Addendum)
Physical Therapy Evaluation Patient Details Name: Timothy [name] MRN: [id number] DOB: [birth year] Today's Date: 02/08/2023  History of Present Illness  Pt is a 89 y.o. male presenting to hospital 02/07/23 with c/o chest pain, increased work of breathing, and orthopnea.  Pt admitted with NSTEMI, HF with preserved EF, and symptomatic severe aortic stenosis.  PMH includes htn, HLD, DM, severe aortic stenosis, a-fib on Eliquis, Parkinson's disease, AAA, Paget's disease, PAD, TIA, back surgery.  Clinical Impression  Prior to hospital admission, pt's daughter reports pt was mainly in w/c (required assist with transfers) but also walked using upright walker with physical therapy; lives at [facility]).  Currently pt is mod assist semi-supine to sitting EOB and mod assist with transfer bed to recliner (no AD use).  Limited activity d/t pt fatigue and SOB.  Pt's HR 81-97 bpm during session.  Pt would currently benefit from skilled PT to address noted impairments and functional limitations (see below for any additional details).  Upon hospital discharge, pt would benefit from ongoing therapy.     If plan is discharge home, recommend the following: A lot of help with walking and/or transfers;A lot of help with bathing/dressing/bathroom;Assistance with cooking/housework;Direct supervision/assist for medications management;Direct supervision/assist for financial management;Assist for transportation;Help with stairs or ramp for entrance   Can travel by private vehicle   No    Equipment Recommendations None recommended by PT (pt has needed DME at facility)  Recommendations for Other Services  OT consult    Functional Status Assessment Patient has had a recent decline in their functional status and demonstrates the ability to make significant improvements in function in a reasonable and predictable amount of time.     Precautions / Restrictions Precautions Precautions: Fall Restrictions Weight  Bearing Restrictions: No      Mobility  Bed Mobility Overal bed mobility: Needs Assistance Bed Mobility: Supine to Sit     Supine to sit: Mod assist, HOB elevated     General bed mobility comments: assist for trunk    Transfers Overall transfer level: Needs assistance Equipment used: None Transfers: Sit to/from Stand, Bed to chair/wheelchair/BSC Sit to Stand: Mod assist   Step pivot transfers: Mod assist       General transfer comment: assist to initiate stand and control descent sitting; assist to steady with transfer bed to recliner; vc's for technique    Ambulation/Gait               General Gait Details: deferred d/t pt fatigue and SOB with activity  Stairs            Wheelchair Mobility     Tilt Bed    Modified Rankin (Stroke Patients Only)       Balance Overall balance assessment: Needs assistance Sitting-balance support: No upper extremity supported, Feet supported Sitting balance-Leahy Scale: Good Sitting balance - Comments: steady reaching within BOS   Standing balance support: Single extremity supported Standing balance-Leahy Scale: Poor Standing balance comment: assist to steady in standing                             Pertinent Vitals/Pain Pain Assessment Pain Assessment: No/denies pain Vitals stable and WFL throughout treatment session.  HR 81-97 bpm and SpO2 sats 94% or greater on 2 L via nasal cannula.    Home Living Family/patient expects to be discharged to:: Skilled nursing facility  Additional Comments: Pt lives at [facility] (SNF) at [facility] (since end of Janurary/beginning of February)    Prior Function Prior Level of Function : Needs assist             Mobility Comments: Pt requires assist with bed mobility, transfers, and walking.  Pt mainly in w/c (assist for transfers) but does walk with PT using upright walker.       Hand Dominance        Extremity/Trunk  Assessment   Upper Extremity Assessment Upper Extremity Assessment: Generalized weakness    Lower Extremity Assessment Lower Extremity Assessment: Generalized weakness    Cervical / Trunk Assessment Cervical / Trunk Assessment: Other exceptions Cervical / Trunk Exceptions: forward head/shoulders  Communication   Communication: HOH  Cognition Arousal/Alertness: Awake/alert Behavior During Therapy: Flat affect                                   General Comments: Oriented to person, place (with some increased cueing).  Pt initially reporting it was 2022 but corrected himself to 2024.  Pt reported it was September.  Increased time noted for pt to respond and initiate movement.        General Comments  Nursing cleared pt for participation in physical therapy.  Pt agreeable to PT session.  Prior to session, therapist discussed pt's troponin levels with MD Agbor-Etang (Cardiology) who cleared pt for participation in physical therapy.    Exercises     Assessment/Plan    PT Assessment Patient needs continued PT services  PT Problem List Decreased strength;Decreased activity tolerance;Decreased balance;Decreased mobility;Decreased knowledge of use of DME;Decreased knowledge of precautions       PT Treatment Interventions DME instruction;Gait training;Functional mobility training;Therapeutic activities;Therapeutic exercise;Balance training;Patient/family education    PT Goals (Current goals can be found in the Care Plan section)  Acute Rehab PT Goals Patient Stated Goal: to improve mobility PT Goal Formulation: With patient/family Time For Goal Achievement: 02/22/23 Potential to Achieve Goals: Good    Frequency Min 1X/week     Co-evaluation               AM-PAC PT "6 Clicks" Mobility  Outcome Measure Help needed turning from your back to your side while in a flat bed without using bedrails?: None Help needed moving from lying on your back to sitting on  the side of a flat bed without using bedrails?: A Lot Help needed moving to and from a bed to a chair (including a wheelchair)?: A Lot Help needed standing up from a chair using your arms (e.g., wheelchair or bedside chair)?: A Lot Help needed to walk in hospital room?: Total Help needed climbing 3-5 steps with a railing? : Total 6 Click Score: 12    End of Session Equipment Utilized During Treatment: Gait belt Activity Tolerance: Patient limited by fatigue Patient left: in chair;with call bell/phone within reach;with chair alarm set;with family/visitor present Nurse Communication: Mobility status;Precautions;Other (comment) (via white board) PT Visit Diagnosis: Other abnormalities of gait and mobility (R26.89);Muscle weakness (generalized) (M62.81)    Time: 1610-9604 PT Time Calculation (min) (ACUTE ONLY): 29 min   Charges:   PT Evaluation $PT Eval Low Complexity: 1 Low PT Treatments $Therapeutic Activity: 8-22 mins PT General Charges $$ ACUTE PT VISIT: 1 Visit        Hendricks Limes, PT 02/08/23, 1:17 PM

## 2023-02-08 NOTE — TOC Initial Note (Signed)
Transition of Care [facility]) - Initial/Assessment Note    Patient Details  Name: Timothy [name] MRN: [id number] Date of Birth: [birth year]  [facility]) CM/SW Contact:    Liliana Cline, LCSW Phone Number: 02/08/2023, 2:31 PM  Clinical Narrative:                 Patient resides at [facility]. PT is recommending STR at DC. Spoke to Sue Lush at [facility] who confirms patient is a LTC resident at their SNF [facility]) and can get rehab when he returns. He would need a 3 night stay since he has Medicare A/B.  Expected Discharge Plan: Skilled Nursing Facility Barriers to Discharge: Continued Medical Work up   Patient Goals and CMS Choice            Expected Discharge Plan and Services       Living arrangements for the past 2 months: Skilled Nursing Facility                                      Prior Living Arrangements/Services Living arrangements for the past 2 months: Skilled Nursing Facility Lives with:: Facility Resident                   Activities of Daily Living   ADL Screening (condition at time of admission) Patient's cognitive ability adequate to safely complete daily activities?: Yes Is the patient deaf or have difficulty hearing?: No  Permission Sought/Granted                  Emotional Assessment              Admission diagnosis:  NSTEMI (non-ST elevated myocardial infarction) (HCC) [I21.4] Acute on chronic diastolic CHF (congestive heart failure) (HCC) [I50.33] Patient Active Problem List   Diagnosis Date Noted   Myocardial ischemia 02/08/2023   CKD stage 3a, GFR 45-59 ml/min (HCC) 02/08/2023   Overweight (BMI 25.0-29.9) 02/08/2023   Atrial fibrillation with rapid ventricular response (HCC) 02/07/2023   Acute on chronic diastolic CHF (congestive heart failure) (HCC) 02/07/2023   History of basal cell carcinoma (BCC) of skin 09/10/2022   Hyponatremia 08/01/2022   Hypokalemia 08/01/2022   Acute pulmonary edema (HCC)  07/31/2022   Parkinsonian features 07/29/2022   Debility 07/29/2022   AKI (acute kidney injury) (HCC) 07/29/2022   Myocardial injury 07/29/2022   Right-sided chest pain 07/28/2022   Acute respiratory failure with hypoxia (HCC)    Pneumonia due to COVID-19 virus 09/18/2019   PAD (peripheral artery disease) (HCC)    Moderate aortic stenosis    AAA (abdominal aortic aneurysm) (HCC)    Type 2 diabetes mellitus with hyperlipidemia (HCC)    Parkinsonism 06/14/2018   Behavioral change 03/25/2018   Symptomatic severe aortic stenosis with normal ejection fraction 02/18/2018   Dysphagia 12/10/2017   Advanced care planning/counseling discussion 11/27/2016   Pernicious anemia 05/28/2015   Leg weakness, bilateral 04/12/2015   Tremor 03/27/2015   Chronic anxiety 02/20/2015   SVT (supraventricular tachycardia) 02/16/2015   History of back surgery 02/16/2015   TIA (transient ischemic attack) 12/29/2011   Hyperlipidemia associated with type 2 diabetes mellitus (HCC) 09/24/2011   PCP:  Earnestine Mealing, MD Pharmacy:   [facility] - [place], [place] - [address] [place] [address] Phone: [phone] Fax: [phone]  [facility] [address] - [place], [place] [address] [place] [address] Phone: [phone] Fax: [phone]  [id number] - [place], [place] - [address] [place] [address] Phone: [phone] Fax: [phone]     Social Determinants of Health (SDOH) Social History: SDOH Screenings   Food Insecurity: No Food Insecurity (07/30/2022)  Housing: Low Risk  (07/30/2022)  Transportation Needs: No Transportation Needs (07/30/2022)  Utilities: Not At Risk (07/30/2022)  Tobacco Use: Low Risk  (02/07/2023)   SDOH Interventions:     Readmission Risk Interventions     No data to display

## 2023-02-08 NOTE — Assessment & Plan Note (Signed)
Converted to sinus rhythm yesterday.  On Eliquis and amiodarone.

## 2023-02-08 NOTE — Assessment & Plan Note (Signed)
Parkinson medications did not work.  Physical therapy recommending rehab.

## 2023-02-08 NOTE — Progress Notes (Signed)
PT Cancellation Note  Patient Details Name: Timothy [name] MRN: [id number] DOB: 1929/02/07   Cancelled Treatment:    Reason Eval/Treat Not Completed: Patient not medically ready.  PT consult received.  Chart reviewed.  Pt's HR noted to be 130-137 bpm resting in bed (nurse notified).  D/t elevated HR at rest, will hold therapy at this time and re-attempt PT evaluation at a later date/time as medically appropriate.  Hendricks Limes, PT 02/08/23, 9:40 AM

## 2023-02-08 NOTE — Progress Notes (Signed)
Rounding Note    Patient Name: Timothy [name] Date of Encounter: 02/08/2023  [facility] Cardiologist: Julien Nordmann, MD   Subjective   Maintaining sinus rhythm since spontaneous conversion in the ED.  Placed on p.o. amiodarone.  No acute events overnight  Inpatient Medications    Scheduled Meds:  amiodarone  200 mg Oral BID   apixaban  5 mg Oral BID   [START ON 02/09/2023] dapagliflozin propanediol  10 mg Oral Daily   furosemide  20 mg Intravenous Daily   insulin aspart  0-5 Units Subcutaneous QHS   insulin aspart  0-6 Units Subcutaneous TID WC   rosuvastatin  10 mg Oral Daily   sertraline  50 mg Oral Daily   sodium chloride flush  3 mL Intravenous Q12H   Continuous Infusions:  sodium chloride     PRN Meds: sodium chloride, acetaminophen, sodium chloride flush   Vital Signs    Vitals:   02/07/23 2021 02/07/23 2331 02/08/23 0328 02/08/23 0800  BP: (!) 108/57 (!) 94/59 (!) 105/59 106/61  Pulse: 83 (!) 54 82 88  Resp: 20 20 18 18   Temp: 97.6 F (36.4 C) (!) 97.5 F (36.4 C) 98 F (36.7 C) 98.1 F (36.7 C)  TempSrc:    Oral  SpO2: 100% 98% 97% 97%  Weight:      Height:        Intake/Output Summary (Last 24 hours) at 02/08/2023 1358 Last data filed at 02/08/2023 1030 Gross per 24 hour  Intake 480 ml  Output 300 ml  Net 180 ml      02/07/2023    6:54 AM 01/29/2023   10:46 AM 01/27/2023    2:25 PM  Last 3 Weights  Weight (lbs) 171 lb 170 lb 6.4 oz 172 lb  Weight (kg) 77.565 kg 77.293 kg 78.019 kg      Telemetry    Sinus rhythm, PACs, heart rate 86- Personally Reviewed  ECG     - Personally Reviewed  Physical Exam   GEN: No acute distress.  Soft-spoken Neck: No JVD Cardiac: RRR, 3/6 systolic murmur Respiratory: Diminished breath sounds bilaterally GI: Soft, nontender, non-distended  MS: Trace edema Neuro:  Nonfocal  Psych: Normal affect   Labs    High Sensitivity Troponin:   Recent Labs  Lab 02/07/23 0700 02/07/23 0929  02/07/23 1213 02/07/23 1438  TROPONINIHS 169* 189* 217* 267*     Chemistry Recent Labs  Lab 02/07/23 0700 02/08/23 0518  NA 132* 135  K 3.7 3.3*  CL 101 100  CO2 20* 24  GLUCOSE 163* 169*  BUN 19 20  CREATININE 1.25* 1.18  CALCIUM 8.5* 8.4*  PROT 6.9 6.9  ALBUMIN 3.6 3.8  AST 25 22  ALT 12 13  ALKPHOS 54 54  BILITOT 1.0 1.4*  GFRNONAA 54* 58*  ANIONGAP 11 11    Lipids No results for input(s): "CHOL", "TRIG", "HDL", "LABVLDL", "LDLCALC", "CHOLHDL" in the last 168 hours.  Hematology Recent Labs  Lab 02/07/23 0700 02/08/23 0518  WBC 8.3 9.5  RBC 3.51* 3.58*  HGB 11.2* 11.5*  HCT 34.1* 33.9*  MCV 97.2 94.7  MCH 31.9 32.1  MCHC 32.8 33.9  RDW 14.5 14.6  PLT 206 193   Thyroid No results for input(s): "TSH", "FREET4" in the last 168 hours.  BNP Recent Labs  Lab 02/07/23 0700  BNP 910.9*    DDimer No results for input(s): "DDIMER" in the last 168 hours.   Radiology    DG Chest  Portable 1 View  Result Date: 02/07/2023 CLINICAL DATA:  Chest pain EXAM: PORTABLE CHEST 1 VIEW COMPARISON:  07/28/2022 FINDINGS: Cardiomegaly. Diffuse bilateral interstitial pulmonary opacity. No new or focal airspace opacity. Osseous structures unremarkable. IMPRESSION: Cardiomegaly with diffuse bilateral interstitial pulmonary opacity, most consistent with edema. No new or focal airspace opacity. Electronically Signed   By: Jearld Lesch M.D.   On: 02/07/2023 07:28    Cardiac Studies   TTE 07/2022 1. Left ventricular ejection fraction, by estimation, is 60 to 65%. The  left ventricle has normal function. The left ventricle has no regional  wall motion abnormalities. There is moderate left ventricular hypertrophy.  Left ventricular diastolic  parameters are indeterminate.   2. Right ventricular systolic function is normal. The right ventricular  size is normal. Tricuspid regurgitation signal is inadequate for assessing  PA pressure.   3. The mitral valve is normal in structure.  Moderate mitral valve  regurgitation. No evidence of mitral stenosis.   4. The aortic valve is calcified. There is severe calcifcation of the  aortic valve. There is severe thickening of the aortic valve. Aortic valve  regurgitation is mild. Severe aortic valve stenosis. Aortic regurgitation  PHT measures 477 msec. Aortic  valve area, by VTI measures 0.43 cm. Aortic valve mean gradient measures  29.0 mmHg.   Patient Profile     89 y.o. male with hx of severe aortic stenosis, paroxysmal atrial fibrillation, HFpEF presenting with worsening chest pressure,  shortness of breath in the context of A-fib RVR.   Assessment & Plan    A-fib RVR, -History of paroxysmal atrial fibrillation -Spontaneous conversion in ED  -Continue amiodarone p.o. 200 mg twice daily x 7 days, decrease to 200 mg daily after. -Continue PTA Eliquis 2.5 mg twice daily   2.  Severe AS -Not candidate for intervention -PTA Lasix 20 mg daily   3.  HFpEF -Possibly exacerbated by A-fib RVR -Amio for A-fib control -Lasix 20 mg daily  Continue PT OT, anticipate discharge if no setbacks  in the next 24 to 48 hours.  Total encounter time more than 50 minutes  Greater than 50% was spent in counseling and coordination of care with the patient      Signed, Debbe Odea, MD  02/08/2023, 1:58 PM

## 2023-02-08 NOTE — Assessment & Plan Note (Signed)
NSTEMI ruled out.  Troponin elevated from CHF and rapid atrial fibrillation.

## 2023-02-08 NOTE — Assessment & Plan Note (Signed)
Continue to monitor with diuresis

## 2023-02-08 NOTE — Assessment & Plan Note (Addendum)
Patient also has severe aortic stenosis.  Switch oral Lasix to 20 mg IV Lasix daily.

## 2023-02-08 NOTE — Progress Notes (Signed)
Progress Note   Patient: Timothy [name] EAV:[id number] DOB: Dec 27, 1928 DOA: 02/07/2023     1 DOS: the patient was seen and examined on 02/08/2023   Brief hospital course: 89 y.o. male with medical history significant of HFpEF, symptomatic severe aortic stenosis, atrial fibrillation, Parkinson's disease, type 2 diabetes presenting with acute HFpEF, NSTEMI, symptomatic or stenosis.  History primarily from family as patient is sleeping.  Per report, patient with increased work of breathing, orthopnea PND over the past 1 to 2 days at facility.  No reported chest pain, nausea or vomiting.  No reported diarrhea.  No hemiparesis.  Baseline chronic severe aortic stenosis followed by Dr. Mariah Milling outpatient.  Family unclear if patient has gotten any doses of his Lasix recently. Presented to the ER afebrile, hemodynamically stable.  White count 8.3, hemoglobin 11.2.  Platelets 206.  Creatinine 1.25.  BNP 911.  Troponin 170.  Chest x-ray with cardiomegaly and interstitial edema.  8/4.  Patient not feeling well with palpitations and feeling sluggish and some right upper chest pain.  Heart rate in the 120s this morning when I saw him sinus tachycardia.  Assessment and Plan: * Acute on chronic diastolic CHF (congestive heart failure) (HCC) Patient also has severe aortic stenosis.  Switch oral Lasix to 20 mg IV Lasix daily.  Symptomatic severe aortic stenosis with normal ejection fraction Cardiology following.  Atrial fibrillation with rapid ventricular response (HCC) Converted to sinus rhythm yesterday.  On Eliquis and amiodarone.  Myocardial ischemia NSTEMI ruled out.  Troponin elevated from CHF and rapid atrial fibrillation.  Type 2 diabetes mellitus with hyperlipidemia (HCC) Last hemoglobin A1c 8.0.  Restart Marcelline Deist.  Will place on sliding scale insulin.  CKD stage 3a, GFR 45-59 ml/min (HCC) Continue to monitor with diuresis.  Hypokalemia Replace potassium  Parkinsonism Parkinson medications did not  work.  Physical therapy recommending rehab.  Overweight (BMI 25.0-29.9) BMI listed as 25.25        Subjective: Patient not feeling too well.  He is feeling sluggish and some palpitations.  Admitted with atrial fibrillation with rapid ventricular response and heart failure exacerbation.  Physical Exam: Vitals:   02/07/23 2021 02/07/23 2331 02/08/23 0328 02/08/23 0800  BP: (!) 108/57 (!) 94/59 (!) 105/59 106/61  Pulse: 83 (!) 54 82 88  Resp: 20 20 18 18   Temp: 97.6 F (36.4 C) (!) 97.5 F (36.4 C) 98 F (36.7 C) 98.1 F (36.7 C)  TempSrc:    Oral  SpO2: 100% 98% 97% 97%  Weight:      Height:       Physical Exam HENT:     Head: Normocephalic.     Mouth/Throat:     Pharynx: No oropharyngeal exudate.  Eyes:     General: Lids are normal.     Conjunctiva/sclera: Conjunctivae normal.  Cardiovascular:     Rate and Rhythm: Regular rhythm. Tachycardia present.     Heart sounds: Normal heart sounds, S1 normal and S2 normal.  Pulmonary:     Breath sounds: Examination of the right-lower field reveals decreased breath sounds and rales. Examination of the left-lower field reveals decreased breath sounds and rales. Decreased breath sounds and rales present. No wheezing or rhonchi.  Abdominal:     Palpations: Abdomen is soft.     Tenderness: There is no abdominal tenderness.  Musculoskeletal:     Right lower leg: Swelling present.     Left lower leg: Swelling present.  Skin:    General: Skin is warm.  Findings: No rash.  Neurological:     Mental Status: He is alert and oriented to person, place, and time.     Comments: No tremor     Data Reviewed: Potassium 3.3, creatinine 1.18, total bilirubin 1.4, troponin 267, white blood cell count 9.5, hemoglobin 11.5, platelet count 193  Family Communication: Spoke with patient's daughter at the bedside  Disposition: Status is: Inpatient Remains inpatient appropriate because: Physical therapy recommending rehab.  Patient was  tachycardic this morning.  Giving a dose of IV Lasix this morning.  Planned Discharge Destination: Rehab    Time spent: 29 minutes  Author: Alford Highland, MD 02/08/2023 1:43 PM  For on call review [email or web address].

## 2023-02-09 DIAGNOSIS — I4891 Unspecified atrial fibrillation: Secondary | ICD-10-CM | POA: Diagnosis not present

## 2023-02-09 DIAGNOSIS — I259 Chronic ischemic heart disease, unspecified: Secondary | ICD-10-CM | POA: Diagnosis not present

## 2023-02-09 DIAGNOSIS — I350 Nonrheumatic aortic (valve) stenosis: Secondary | ICD-10-CM | POA: Diagnosis not present

## 2023-02-09 DIAGNOSIS — F32A Depression, unspecified: Secondary | ICD-10-CM

## 2023-02-09 DIAGNOSIS — F419 Anxiety disorder, unspecified: Secondary | ICD-10-CM

## 2023-02-09 DIAGNOSIS — I5033 Acute on chronic diastolic (congestive) heart failure: Secondary | ICD-10-CM | POA: Diagnosis not present

## 2023-02-09 DIAGNOSIS — E663 Overweight: Secondary | ICD-10-CM

## 2023-02-09 LAB — GLUCOSE, CAPILLARY
Glucose-Capillary: 172 mg/dL — ABNORMAL HIGH (ref 70–99)
Glucose-Capillary: 239 mg/dL — ABNORMAL HIGH (ref 70–99)
Glucose-Capillary: 180 mg/dL — ABNORMAL HIGH (ref 70–99)
Glucose-Capillary: 161 mg/dL — ABNORMAL HIGH (ref 70–99)

## 2023-02-09 LAB — BASIC METABOLIC PANEL
Anion gap: 14 (ref 5–15)
BUN: 20 mg/dL (ref 8–23)
CO2: 21 mmol/L — ABNORMAL LOW (ref 22–32)
Calcium: 8.4 mg/dL — ABNORMAL LOW (ref 8.9–10.3)
Chloride: 100 mmol/L (ref 98–111)
Creatinine, Ser: 1.22 mg/dL (ref 0.61–1.24)
GFR, Estimated: 56 mL/min — ABNORMAL LOW (ref >60)
Glucose, Bld: 180 mg/dL — ABNORMAL HIGH (ref 70–99)
Potassium: 3.7 mmol/L (ref 3.5–5.1)
Sodium: 135 mmol/L (ref 135–145)

## 2023-02-09 LAB — TSH: TSH: 2.841 u[IU]/mL (ref 0.350–4.500)

## 2023-02-09 MED ORDER — SERTRALINE HCL 50 MG PO TABS
50.0000 mg | ORAL_TABLET | Freq: Once | ORAL | Status: AC
  Administered 2023-02-09: 50 mg via ORAL
  Filled 2023-02-09: qty 1

## 2023-02-09 MED ORDER — AMIODARONE HCL 200 MG PO TABS: 200.0000 mg | ORAL_TABLET | Freq: Two times a day (BID) | ORAL | Status: DC

## 2023-02-09 MED ORDER — ALPRAZOLAM 0.25 MG PO TABS
0.2500 mg | ORAL_TABLET | Freq: Two times a day (BID) | ORAL | Status: DC | PRN
  Administered 2023-02-09: 0.25 mg via ORAL
  Filled 2023-02-09: qty 1

## 2023-02-09 MED ORDER — APIXABAN 5 MG PO TABS: 5.0000 mg | ORAL_TABLET | Freq: Two times a day (BID) | ORAL | Status: DC

## 2023-02-09 MED ORDER — INSULIN ASPART 100 UNIT/ML IJ SOLN: 0.0000 [IU] | Freq: Three times a day (TID) | INTRAMUSCULAR | Status: DC

## 2023-02-09 MED ORDER — INSULIN ASPART 100 UNIT/ML IJ SOLN: 0.0000 [IU] | Freq: Every day | INTRAMUSCULAR | Status: DC

## 2023-02-09 MED ORDER — DOCUSATE SODIUM 100 MG PO CAPS
100.0000 mg | ORAL_CAPSULE | Freq: Two times a day (BID) | ORAL | Status: DC
  Administered 2023-02-09: 100 mg via ORAL
  Filled 2023-02-09: qty 1

## 2023-02-09 MED ORDER — SERTRALINE HCL 100 MG PO TABS: 100.0000 mg | ORAL_TABLET | Freq: Every day | ORAL | Status: DC

## 2023-02-09 MED ORDER — ALPRAZOLAM 0.25 MG PO TABS: 0.2500 mg | ORAL_TABLET | Freq: Two times a day (BID) | ORAL | Status: DC | PRN

## 2023-02-09 MED ORDER — FUROSEMIDE 10 MG/ML IJ SOLN: 20.0000 mg | Freq: Every day | INTRAMUSCULAR | Status: DC

## 2023-02-09 MED ORDER — FAMOTIDINE 20 MG PO TABS
20.0000 mg | ORAL_TABLET | Freq: Every day | ORAL | Status: DC
  Administered 2023-02-09: 20 mg via ORAL
  Filled 2023-02-09: qty 1

## 2023-02-09 MED ORDER — SERTRALINE HCL 50 MG PO TABS: 100.0000 mg | ORAL_TABLET | Freq: Every day | ORAL | Status: DC

## 2023-02-09 NOTE — Progress Notes (Signed)
Just received call from [facility] with room number. Spoke to [place], Charity fundraiser and gave her initial answers to her questions. She told me not to call report until the transport people arrive. Called [facility] and gave them report. They will call with an approximate ETA but they said there are "quite a few in front of him so it will be awhile." Dr. Renae Gloss and Dr. Okey Dupre notified.

## 2023-02-09 NOTE — Discharge Instructions (Signed)

## 2023-02-09 NOTE — Progress Notes (Signed)
Physical Therapy Treatment Patient Details Name: Timothy [name] MRN: [id number] DOB: 28-Apr-1929 Today's Date: 02/09/2023   History of Present Illness Pt is a 89 y.o. male presenting to hospital 02/07/23 with c/o chest pain, increased work of breathing, and orthopnea.  Pt admitted with NSTEMI, HF with preserved EF, and symptomatic severe aortic stenosis.  PMH includes htn, HLD, DM, severe aortic stenosis, a-fib on Eliquis, Parkinson's disease, AAA, Paget's disease, PAD, TIA, back surgery.    PT Comments  Pt resting in bed upon PT arrival; agreeable to therapy; pt's daughter present.  During session pt max assist with bed mobility and transfer bed to recliner.  Pt noted with increased SOB and weakness compared to yesterday's therapy session requiring increased assist today (nurse notified).  Pt's SpO2 sats 92% or greater on 1 L O2 via nasal cannula and HR 92-106 bpm during sessions activities.  Will continue to focus on strengthening and progressive functional mobility per pt tolerance.    If plan is discharge home, recommend the following: A lot of help with walking and/or transfers;A lot of help with bathing/dressing/bathroom;Assistance with cooking/housework;Direct supervision/assist for medications management;Direct supervision/assist for financial management;Assist for transportation;Help with stairs or ramp for entrance   Can travel by private vehicle     No  Equipment Recommendations  None recommended by PT (pt has needed DME at facility)    Recommendations for Other Services       Precautions / Restrictions Precautions Precautions: Fall Restrictions Weight Bearing Restrictions: No     Mobility  Bed Mobility Overal bed mobility: Needs Assistance Bed Mobility: Supine to Sit     Supine to sit: HOB elevated, Max assist     General bed mobility comments: assist for trunk and B LE's; vc's for technique    Transfers Overall transfer level: Needs assistance Equipment used:  None Transfers: Sit to/from Stand, Bed to chair/wheelchair/BSC Sit to Stand: Max assist Stand pivot transfers: Max assist         General transfer comment: assist to initiate stand and control descent sitting; assist to steady with transfer bed to recliner; vc's for technique    Ambulation/Gait               General Gait Details: deferred d/t pt fatigue and SOB with activity   Stairs             Wheelchair Mobility     Tilt Bed    Modified Rankin (Stroke Patients Only)       Balance Overall balance assessment: Needs assistance Sitting-balance support: No upper extremity supported, Feet supported Sitting balance-Leahy Scale: Good Sitting balance - Comments: steady reaching within BOS   Standing balance support: Single extremity supported Standing balance-Leahy Scale: Poor Standing balance comment: assist to steady in standing                            Cognition Arousal/Alertness: Awake/alert Behavior During Therapy: Flat affect Overall Cognitive Status: History of cognitive impairments - at baseline                                 General Comments: Increased time noted for pt to respond and initiate movement.        Exercises      General Comments  Nursing cleared pt for participation in physical therapy.  Pt agreeable to PT session.      Pertinent Vitals/Pain  Pain Assessment Pain Assessment: No/denies pain    Home Living                          Prior Function            PT Goals (current goals can now be found in the care plan section) Acute Rehab PT Goals Patient Stated Goal: to improve mobility PT Goal Formulation: With patient/family Time For Goal Achievement: 02/22/23 Potential to Achieve Goals: Good Progress towards PT goals: Progressing toward goals    Frequency    Min 1X/week      PT Plan Current plan remains appropriate    Co-evaluation              AM-PAC PT "6  Clicks" Mobility   Outcome Measure  Help needed turning from your back to your side while in a flat bed without using bedrails?: A Little Help needed moving from lying on your back to sitting on the side of a flat bed without using bedrails?: A Lot Help needed moving to and from a bed to a chair (including a wheelchair)?: A Lot Help needed standing up from a chair using your arms (e.g., wheelchair or bedside chair)?: A Lot Help needed to walk in hospital room?: Total Help needed climbing 3-5 steps with a railing? : Total 6 Click Score: 11    End of Session Equipment Utilized During Treatment: Gait belt;Oxygen (1 L via nasal cannula) Activity Tolerance: Patient limited by fatigue;Other (comment) (Limited d/t SOB with activity) Patient left: in chair;with call bell/phone within reach;with chair alarm set;with family/visitor present Nurse Communication: Mobility status;Precautions (pt's SOB with activity) PT Visit Diagnosis: Other abnormalities of gait and mobility (R26.89);Muscle weakness (generalized) (M62.81)     Time: 1610-9604 PT Time Calculation (min) (ACUTE ONLY): 15 min  Charges:    $Therapeutic Activity: 8-22 mins PT General Charges $$ ACUTE PT VISIT: 1 Visit                     Hendricks Limes, PT 02/09/23, 12:08 PM

## 2023-02-09 NOTE — Progress Notes (Signed)
Rounding Note    Patient Name: Timothy [name] Date of Encounter: 02/09/2023  [facility] Cardiologist: Julien Nordmann, MD   Subjective   He is maintaining NSR. O2 was stopped this AM, but had increased work of breathing and placed back on 1L O2. He reports vague chest pain. UOP -1.4L.  Inpatient Medications    Scheduled Meds:  amiodarone  200 mg Oral BID   apixaban  5 mg Oral BID   dapagliflozin propanediol  10 mg Oral Daily   furosemide  20 mg Intravenous Daily   insulin aspart  0-5 Units Subcutaneous QHS   insulin aspart  0-6 Units Subcutaneous TID WC   rosuvastatin  10 mg Oral Daily   [START ON 02/10/2023] sertraline  100 mg Oral Daily   sertraline  50 mg Oral Once   Continuous Infusions:  PRN Meds: acetaminophen, ALPRAZolam   Vital Signs    Vitals:   02/08/23 2053 02/09/23 0047 02/09/23 0431 02/09/23 0752  BP: (!) 105/58 (!) 101/57 96/62 (!) 97/59  Pulse: 96 99 98 92  Resp:    20  Temp: 97.9 F (36.6 C) 97.6 F (36.4 C) 98.3 F (36.8 C) (!) 97.5 F (36.4 C)  TempSrc: Oral Oral Oral   SpO2: 99% 93% 97% 94%  Weight:      Height:        Intake/Output Summary (Last 24 hours) at 02/09/2023 1008 Last data filed at 02/09/2023 0000 Gross per 24 hour  Intake 720 ml  Output 1450 ml  Net -730 ml      02/07/2023    6:54 AM 01/29/2023   10:46 AM 01/27/2023    2:25 PM  Last 3 Weights  Weight (lbs) 171 lb 170 lb 6.4 oz 172 lb  Weight (kg) 77.565 kg 77.293 kg 78.019 kg      Telemetry    NSR, PACs, HR 90-100s - Personally Reviewed  ECG    No new - Personally Reviewed  Physical Exam   GEN: No acute distress.   Neck: No JVD Cardiac: RRR, + murmur, no rubs, or gallops.  Respiratory: decreased breath sounds GI: Soft, nontender, non-distended  MS: No edema; No deformity. Neuro:  Nonfocal  Psych: Normal affect   Labs    High Sensitivity Troponin:   Recent Labs  Lab 02/07/23 0700 02/07/23 0929 02/07/23 1213 02/07/23 1438  TROPONINIHS 169* 189*  217* 267*     Chemistry Recent Labs  Lab 02/07/23 0700 02/08/23 0518  NA 132* 135  K 3.7 3.3*  CL 101 100  CO2 20* 24  GLUCOSE 163* 169*  BUN 19 20  CREATININE 1.25* 1.18  CALCIUM 8.5* 8.4*  PROT 6.9 6.9  ALBUMIN 3.6 3.8  AST 25 22  ALT 12 13  ALKPHOS 54 54  BILITOT 1.0 1.4*  GFRNONAA 54* 58*  ANIONGAP 11 11    Lipids No results for input(s): "CHOL", "TRIG", "HDL", "LABVLDL", "LDLCALC", "CHOLHDL" in the last 168 hours.  Hematology Recent Labs  Lab 02/07/23 0700 02/08/23 0518  WBC 8.3 9.5  RBC 3.51* 3.58*  HGB 11.2* 11.5*  HCT 34.1* 33.9*  MCV 97.2 94.7  MCH 31.9 32.1  MCHC 32.8 33.9  RDW 14.5 14.6  PLT 206 193   Thyroid No results for input(s): "TSH", "FREET4" in the last 168 hours.  BNP Recent Labs  Lab 02/07/23 0700  BNP 910.9*    DDimer No results for input(s): "DDIMER" in the last 168 hours.   Radiology    No  results found.  Cardiac Studies   TTE 07/2022 1. Left ventricular ejection fraction, by estimation, is 60 to 65%. The  left ventricle has normal function. The left ventricle has no regional  wall motion abnormalities. There is moderate left ventricular hypertrophy.  Left ventricular diastolic  parameters are indeterminate.   2. Right ventricular systolic function is normal. The right ventricular  size is normal. Tricuspid regurgitation signal is inadequate for assessing  PA pressure.   3. The mitral valve is normal in structure. Moderate mitral valve  regurgitation. No evidence of mitral stenosis.   4. The aortic valve is calcified. There is severe calcifcation of the  aortic valve. There is severe thickening of the aortic valve. Aortic valve  regurgitation is mild. Severe aortic valve stenosis. Aortic regurgitation  PHT measures 477 msec. Aortic  valve area, by VTI measures 0.43 cm. Aortic valve mean gradient measures  29.0 mmHg.   Patient Profile     89 y.o. male with hx of severe aortic stenosis, paroxysmal atrial fibrillation,  HFpEF presenting with worsening chest pressure,  shortness of breath in the context of A-fib RVR.   Assessment & Plan    Afib RVR - patient spontaneously converted to NSR in the ER - amiodarone changed to oral - Eliquis 5mg  BID - continue amiodarone 200mg  BID x 1 week, amiodarone 200mg  daily thereafter. Family has concerns with side effects of amiodarone. He was previously on Flecainide. May need to get EP to weigh in on AA as it seems he needs this to maintain NSR.  Severe AS - he was evaluated by Structural valve team in 2022 and not felt to be a good TAVR candidate due to poor functional status, advanced Parkinson's disease and advanced age - patient wants second opinion at [facility] - possibly contributing to SOB  Acute on chronic HFpEF - exacerbation in the setting of Afib RVR and underlying severe AS - Echo 07/2022 showed LVEF 60-65%, no WMA, moderate LVH, mod MR, severe AS - IV lasix 20mg  - Net-1L - kidney function stable - Farxiga 10mg  daily - continue with diuresis  For questions or updates, please contact [facility] Please consult [email or web address] for contact info under        Signed,  David Stall, PA-C  02/09/2023, 10:08 AM

## 2023-02-09 NOTE — Discharge Summary (Signed)
Physician Discharge Summary   Patient: Timothy [name] MRN: [id number] DOB: [birth year]  Admit date:     02/07/2023  Discharge to [facility] when bed available: 02/09/23  Discharge Physician: Alford Highland   PCP: Earnestine Mealing, MD   Recommendations at discharge:   Follow-up with team at [facility] 1 day  Discharge Diagnoses: Principal Problem:   Acute on chronic diastolic CHF (congestive heart failure) (HCC) Active Problems:   Symptomatic severe aortic stenosis with normal ejection fraction   Atrial fibrillation with rapid ventricular response (HCC)   Type 2 diabetes mellitus with hyperlipidemia (HCC)   Myocardial ischemia   CKD stage 3a, GFR 45-59 ml/min (HCC)   Hypokalemia   Parkinsonism   Anxiety and depression   Overweight (BMI 25.0-29.9)    Hospital Course: 89 y.o. male with medical history significant of HFpEF, symptomatic severe aortic stenosis, atrial fibrillation, Parkinson's disease, type 2 diabetes presenting with acute HFpEF, NSTEMI, symptomatic or stenosis.  History primarily from family as patient is sleeping.  Per report, patient with increased work of breathing, orthopnea PND over the past 1 to 2 days at facility.  No reported chest pain, nausea or vomiting.  No reported diarrhea.  No hemiparesis.  Baseline chronic severe aortic stenosis followed by Dr. Mariah Milling outpatient.  Family unclear if patient has gotten any doses of his Lasix recently. Presented to the ER afebrile, hemodynamically stable.  White count 8.3, hemoglobin 11.2.  Platelets 206.  Creatinine 1.25.  BNP 911.  Troponin 170.  Chest x-ray with cardiomegaly and interstitial edema.  8/4.  Patient not feeling well with palpitations and feeling sluggish and some right upper chest pain.  Heart rate in the 120s this morning when I saw him sinus tachycardia. 8/5.  Patient not sure how he is feeling.  Not complaining of chest pain or palpitations.  Some shortness of breath.  Case discussed with cardiology and recommended  second opinion for aortic valve replacement at [facility].  Patient accepted to [facility] but on the wait list.  Assessment and Plan: * Acute on chronic diastolic CHF (congestive heart failure) (HCC) Patient also has severe aortic stenosis.  Switch oral Lasix to 20 mg IV Lasix daily.  Patient on Farxiga.  Holding beta-blocker currently.  Symptomatic severe aortic stenosis with normal ejection fraction Cardiology recommending speaking with [facility] for a second opinion about aortic valve replacement.  Patient accepted to [facility] but on wait list.  Overall prognosis poor with heart failure and severe aortic stenosis.  Atrial fibrillation with rapid ventricular response (HCC) Converted to sinus rhythm in emergency room.  On Eliquis for anticoagulation and amiodarone for rate control.  Myocardial ischemia NSTEMI ruled out.  Troponin elevated from CHF and rapid atrial fibrillation.  Type 2 diabetes mellitus with hyperlipidemia (HCC) Last hemoglobin A1c 8.0.  Restart Marcelline Deist.  On sliding scale insulin.  On Crestor.  CKD stage 3a, GFR 45-59 ml/min (HCC) Continue to monitor with diuresis.  Creatinine 1.22 with a GFR of 56.  Hypokalemia Replaced  Parkinsonism Parkinson medications did not work.  Physical therapy recommending rehab.  Overweight (BMI 25.0-29.9) BMI listed as 25.25.  Not sure how accurate this is.  Anxiety and depression Increase dose of Zoloft to 100 mg daily.  As needed Xanax.         Consultants: Cardiology Procedures performed: None Disposition: Patient will be discharged to [facility] for second opinion on aortic valve replacement.  Will likely need rehab after that.  Patient is a resident at [facility] and they have rehab there.  Diet recommendation:  Cardiac and Carb modified diet DISCHARGE MEDICATION: Allergies as of 02/09/2023   No Known Allergies      Medication List     STOP taking these medications    CANNABIDIOL PO   chlorhexidine 0.12 % solution Commonly known as:  PERIDEX   diclofenac Sodium 1 % Gel Commonly known as: VOLTAREN   furosemide 20 MG tablet Commonly known as: LASIX Replaced by: furosemide 10 MG/ML injection   glipiZIDE-metformin 5-500 MG tablet Commonly known as: METAGLIP   GLUCOSAMINE-CHONDROITIN PO   Januvia 100 MG tablet Generic drug: sitaGLIPtin   lidocaine 5 % Commonly known as: LIDODERM   metoprolol succinate 25 MG 24 hr tablet Commonly known as: TOPROL-XL   saccharomyces boulardii 250 MG capsule Commonly known as: FLORASTOR   Zinc Oxide 12.8 % ointment Commonly known as: TRIPLE PASTE       TAKE these medications    acetaminophen 325 MG tablet Commonly known as: TYLENOL Take 2 tablets (650 mg total) by mouth every 6 (six) hours as needed for mild pain or headache (fever >/= 101).   ALPRAZolam 0.25 MG tablet Commonly known as: XANAX Take 1 tablet (0.25 mg total) by mouth 2 (two) times daily as needed for anxiety.   amiodarone 200 MG tablet Commonly known as: PACERONE Take 1 tablet (200 mg total) by mouth 2 (two) times daily.   apixaban 5 MG Tabs tablet Commonly known as: ELIQUIS Take 1 tablet (5 mg total) by mouth 2 (two) times daily. What changed:  medication strength how much to take   Calcium Plus Vitamin D 500-5 MG-MCG Tabs Generic drug: Calcium Carb-Cholecalciferol Take 1 tablet by mouth daily.   docusate sodium 100 MG capsule Commonly known as: COLACE Take 100 mg by mouth daily as needed for mild constipation.   famotidine 20 MG tablet Commonly known as: PEPCID Take 20 mg by mouth at bedtime.   Farxiga 10 MG Tabs tablet Generic drug: dapagliflozin propanediol TAKE 1 TABLET BY MOUTH DAILY   furosemide 10 MG/ML injection Commonly known as: LASIX Inject 2 mLs (20 mg total) into the vein daily. Start taking on: February 10, 2023 Replaces: furosemide 20 MG tablet   insulin aspart 100 UNIT/ML injection Commonly known as: novoLOG Inject 0-5 Units into the skin at bedtime.   insulin  aspart 100 UNIT/ML injection Commonly known as: novoLOG Inject 0-6 Units into the skin 3 (three) times daily with meals.   rosuvastatin 10 MG tablet Commonly known as: CRESTOR Take 1 tablet (10 mg total) by mouth daily.   sertraline 100 MG tablet Commonly known as: ZOLOFT Take 1 tablet (100 mg total) by mouth daily. Start taking on: February 10, 2023 What changed:  medication strength how much to take        Discharge Exam: Filed Weights   02/07/23 0654  Weight: 77.6 kg   Physical Exam HENT:     Head: Normocephalic.     Mouth/Throat:     Pharynx: No oropharyngeal exudate.  Eyes:     General: Lids are normal.     Conjunctiva/sclera: Conjunctivae normal.  Cardiovascular:     Rate and Rhythm: Normal rate and regular rhythm.     Heart sounds: Normal heart sounds, S1 normal and S2 normal.  Pulmonary:     Breath sounds: Examination of the right-lower field reveals decreased breath sounds and rhonchi. Examination of the left-lower field reveals decreased breath sounds and rhonchi. Decreased breath sounds and rhonchi present. No wheezing or rales.  Abdominal:  Palpations: Abdomen is soft.     Tenderness: There is no abdominal tenderness.  Musculoskeletal:     Right lower leg: Swelling present.     Left lower leg: Swelling present.  Skin:    General: Skin is warm.     Findings: No rash.  Neurological:     Mental Status: He is alert and oriented to person, place, and time.     Comments: No tremor      Condition at discharge: fair  The results of significant diagnostics from this hospitalization (including imaging, microbiology, ancillary and laboratory) are listed below for reference.   Imaging Studies: DG Chest Portable 1 View  Result Date: 02/07/2023 CLINICAL DATA:  Chest pain EXAM: PORTABLE CHEST 1 VIEW COMPARISON:  07/28/2022 FINDINGS: Cardiomegaly. Diffuse bilateral interstitial pulmonary opacity. No new or focal airspace opacity. Osseous structures unremarkable.  IMPRESSION: Cardiomegaly with diffuse bilateral interstitial pulmonary opacity, most consistent with edema. No new or focal airspace opacity. Electronically Signed   By: Jearld Lesch M.D.   On: 02/07/2023 07:28    Microbiology: Results for orders placed or performed during the hospital encounter of 02/07/23  SARS Coronavirus 2 by RT PCR (hospital order, performed in [facility] hospital lab) *cepheid single result test* Anterior Nasal Swab     Status: None   Collection Time: 02/07/23  7:13 AM   Specimen: Anterior Nasal Swab  Result Value Ref Range Status   SARS Coronavirus 2 by RT PCR NEGATIVE NEGATIVE Final    Comment: (NOTE) SARS-CoV-2 target nucleic acids are NOT DETECTED.  The SARS-CoV-2 RNA is generally detectable in upper and lower respiratory specimens during the acute phase of infection. The lowest concentration of SARS-CoV-2 viral copies this assay can detect is 250 copies / mL. A negative result does not preclude SARS-CoV-2 infection and should not be used as the sole basis for treatment or other patient management decisions.  A negative result may occur with improper specimen collection / handling, submission of specimen other than nasopharyngeal swab, presence of viral mutation(s) within the areas targeted by this assay, and inadequate number of viral copies (<250 copies / mL). A negative result must be combined with clinical observations, patient history, and epidemiological information.  Fact Sheet for Patients:   RoadLapTop.co.za  Fact Sheet for Healthcare Providers: http://kim-miller.com/  This test is not yet approved or  cleared by the [place] FDA and has been authorized for detection and/or diagnosis of SARS-CoV-2 by FDA under an Emergency Use Authorization (EUA).  This EUA will remain in effect (meaning this test can be used) for the duration of the COVID-19 declaration under Section 564(b)(1) of the Act, 21  U.S.C. section 360bbb-3(b)(1), unless the authorization is terminated or revoked sooner.  Performed at [facility], [address] [place], [place] [address]     Labs: CBC: Recent Labs  Lab 02/07/23 0700 02/08/23 0518  WBC 8.3 9.5  NEUTROABS 5.0  --   HGB 11.2* 11.5*  HCT 34.1* 33.9*  MCV 97.2 94.7  PLT 206 193   Basic Metabolic Panel: Recent Labs  Lab 02/07/23 0700 02/08/23 0518 02/09/23 0813  NA 132* 135 135  K 3.7 3.3* 3.7  CL 101 100 100  CO2 20* 24 21*  GLUCOSE 163* 169* 180*  BUN 19 20 20   CREATININE 1.25* 1.18 1.22  CALCIUM 8.5* 8.4* 8.4*   Liver Function Tests: Recent Labs  Lab 02/07/23 0700 02/08/23 0518  AST 25 22  ALT 12 13  ALKPHOS 54 54  BILITOT 1.0 1.4*  PROT 6.9 6.9  ALBUMIN 3.6 3.8   CBG: Recent Labs  Lab 02/08/23 1728 02/08/23 2108 02/09/23 0754 02/09/23 1405  GLUCAP 255* 170* 172* 239*    Discharge time spent: greater than 30 minutes.  Signed: Alford Highland, MD [facility] 02/09/2023

## 2023-02-09 NOTE — Progress Notes (Signed)
Progress Note   Patient: Timothy [name] GLO:[id number] DOB: 27-Feb-1929 DOA: 02/07/2023     2 DOS: the patient was seen and examined on 02/09/2023   Brief hospital course: 89 y.o. male with medical history significant of HFpEF, symptomatic severe aortic stenosis, atrial fibrillation, Parkinson's disease, type 2 diabetes presenting with acute HFpEF, NSTEMI, symptomatic or stenosis.  History primarily from family as patient is sleeping.  Per report, patient with increased work of breathing, orthopnea PND over the past 1 to 2 days at facility.  No reported chest pain, nausea or vomiting.  No reported diarrhea.  No hemiparesis.  Baseline chronic severe aortic stenosis followed by Dr. Mariah Milling outpatient.  Family unclear if patient has gotten any doses of his Lasix recently. Presented to the ER afebrile, hemodynamically stable.  White count 8.3, hemoglobin 11.2.  Platelets 206.  Creatinine 1.25.  BNP 911.  Troponin 170.  Chest x-ray with cardiomegaly and interstitial edema.  8/4.  Patient not feeling well with palpitations and feeling sluggish and some right upper chest pain.  Heart rate in the 120s this morning when I saw him sinus tachycardia. 8/5.  Patient not sure how he is feeling.  Not complaining of chest pain or palpitations.  Some shortness of breath.  Case discussed with cardiology and recommended second opinion for aortic valve replacement at [facility].  Patient accepted to [facility] but on the wait list.  Assessment and Plan: * Acute on chronic diastolic CHF (congestive heart failure) (HCC) Patient also has severe aortic stenosis.  Switch oral Lasix to 20 mg IV Lasix daily.  Patient on Farxiga.  Holding beta-blocker currently.  Symptomatic severe aortic stenosis with normal ejection fraction Cardiology recommending speaking with [facility] for a second opinion about aortic valve replacement.  Patient accepted to [facility] but on wait list.  Overall prognosis poor with heart failure and severe aortic stenosis.  Atrial  fibrillation with rapid ventricular response (HCC) Converted to sinus rhythm in emergency room.  On Eliquis for anticoagulation and amiodarone for rate control.  Myocardial ischemia NSTEMI ruled out.  Troponin elevated from CHF and rapid atrial fibrillation.  Type 2 diabetes mellitus with hyperlipidemia (HCC) Last hemoglobin A1c 8.0.  Restart Marcelline Deist.  On sliding scale insulin.  On Crestor.  CKD stage 3a, GFR 45-59 ml/min (HCC) Continue to monitor with diuresis.  Creatinine 1.22 with a GFR of 56.  Hypokalemia Replaced  Parkinsonism Parkinson medications did not work.  Physical therapy recommending rehab.  Overweight (BMI 25.0-29.9) BMI listed as 25.25.  Not sure how accurate this is.  Anxiety and depression Increase dose of Zoloft to 100 mg daily.  As needed Xanax.        Subjective: Patient not sure how he feels.  No chest pain or palpitations today some shortness of breath.  Cardiology recommending evaluation at [facility] for second opinion.  Physical Exam: Vitals:   02/09/23 0047 02/09/23 0431 02/09/23 0752 02/09/23 1138  BP: (!) 101/57 96/62 (!) 97/59 106/60  Pulse: 99 98 92 94  Resp:   20 20  Temp: 97.6 F (36.4 C) 98.3 F (36.8 C) (!) 97.5 F (36.4 C) 98.1 F (36.7 C)  TempSrc: Oral Oral    SpO2: 93% 97% 94% 98%  Weight:      Height:       Physical Exam HENT:     Head: Normocephalic.     Mouth/Throat:     Pharynx: No oropharyngeal exudate.  Eyes:     General: Lids are normal.  Conjunctiva/sclera: Conjunctivae normal.  Cardiovascular:     Rate and Rhythm: Normal rate and regular rhythm.     Heart sounds: Normal heart sounds, S1 normal and S2 normal.  Pulmonary:     Breath sounds: Examination of the right-lower field reveals decreased breath sounds and rhonchi. Examination of the left-lower field reveals decreased breath sounds and rhonchi. Decreased breath sounds and rhonchi present. No wheezing or rales.  Abdominal:     Palpations: Abdomen is soft.      Tenderness: There is no abdominal tenderness.  Musculoskeletal:     Right lower leg: Swelling present.     Left lower leg: Swelling present.  Skin:    General: Skin is warm.     Findings: No rash.  Neurological:     Mental Status: He is alert and oriented to person, place, and time.     Comments: No tremor     Data Reviewed: Creatinine 1.22, sodium 135, potassium 3.7, hemoglobin 11.5  Family Communication: Spoke with daughter at the bedside this morning and spoke with another daughter on the phone this afternoon  Disposition: Status is: Inpatient Remains inpatient appropriate because: Patient will go to [facility] for evaluation for aortic valve replacement.  Planned Discharge Destination: Rehab    Time spent: 35 minutes Case discussed with cardiology and nursing staff.  Author: Alford Highland, MD 02/09/2023 3:54 PM  For on call review [email or web address].

## 2023-02-09 NOTE — Assessment & Plan Note (Signed)
Increase dose of Zoloft to 100 mg daily.  As needed Xanax.

## 2023-02-09 NOTE — Plan of Care (Signed)
  Problem: Education: Goal: Knowledge of General Education information will improve Description: Including pain rating scale, medication(s)/side effects and non-pharmacologic comfort measures Outcome: Progressing   Problem: Clinical Measurements: Goal: Will remain free from infection Outcome: Progressing Goal: Diagnostic test results will improve Outcome: Progressing   

## 2023-02-10 NOTE — Progress Notes (Signed)
Patient is ready for transfer to [facility]. Transportation has arrived. Transfer explained to patient and family sitter. V/S obtained. Removed from telemetry. Report given to [place], Charity fundraiser.

## 2023-02-12 ENCOUNTER — Encounter: Payer: Medicare Other | Admitting: Family

## 2023-02-12 NOTE — Progress Notes (Deleted)
Advanced Heart Failure Clinic Note   Referring Physician: PCP: Earnestine Mealing, MD PCP-Cardiologist: Julien Nordmann, MD   HPI:  Timothy [name] is a 89 y/o man with a history of hyperlipidemia, HTN, diabetes, severe aortic valve stenosis, paroxysmal atrial fibrillation on anticoagulation, bilateral popliteal artery aneurysms, B/l popliteal bypass in 12/2012 and 05/2013 for 1.7 cm aneurysm popliteal, back surgery 2016, parkinsons, vagal events on the toilet and                             chronic heart failure.        Review of Systems: [y] = yes, [ ]  = no   General: Weight gain [ ] ; Weight loss [ ] ; Anorexia [ ] ; Fatigue [ ] ; Fever [ ] ; Chills [ ] ; Weakness [ ]   Cardiac: Chest pain/pressure [ ] ; Resting SOB [ ] ; Exertional SOB [ ] ; Orthopnea [ ] ; Pedal Edema [ ] ; Palpitations [ ] ; Syncope [ ] ; Presyncope [ ] ; Paroxysmal nocturnal dyspnea[ ]   Pulmonary: Cough [ ] ; Wheezing[ ] ; Hemoptysis[ ] ; Sputum [ ] ; Snoring [ ]   GI: Vomiting[ ] ; Dysphagia[ ] ; Melena[ ] ; Hematochezia [ ] ; Heartburn[ ] ; Abdominal pain [ ] ; Constipation [ ] ; Diarrhea [ ] ; BRBPR [ ]   GU: Hematuria[ ] ; Dysuria [ ] ; Nocturia[ ]   Vascular: Pain in legs with walking [ ] ; Pain in feet with lying flat [ ] ; Non-healing sores [ ] ; Stroke [ ] ; TIA [ ] ; Slurred speech [ ] ;  Neuro: Headaches[ ] ; Vertigo[ ] ; Seizures[ ] ; Paresthesias[ ] ;Blurred vision [ ] ; Diplopia [ ] ; Vision changes [ ]   Ortho/Skin: Arthritis [ ] ; Joint pain [ ] ; Muscle pain [ ] ; Joint swelling [ ] ; Back Pain [ ] ; Rash [ ]   Psych: Depression[ ] ; Anxiety[ ]   Heme: Bleeding problems [ ] ; Clotting disorders [ ] ; Anemia [ ]   Endocrine: Diabetes [ ] ; Thyroid dysfunction[ ]    Past Medical History:  Diagnosis Date   (HFpEF) heart failure with preserved ejection fraction (HCC)    a. 05/2019 Echo: EF 65-70%, mod LVH. Gr2 DD. Mod AS; b. 08/2020 Echo: EF 65-70%, mod LVH, GrI DD, nl RV fxn, severe AS.   AAA (abdominal aortic aneurysm) (HCC)    a. 2014 Abd U/S: 3.5cm AAA; b.  10/20189 Abd U/S: 3.3cm AAA; c. 01/2018 CT Abd/Pelvis: 2.7cm infrarenal AAA - rec f/u U/S in 5 yrs.   Actinic keratosis    Back pain    Basal cell carcinoma 08/22/2019   Left mid ear helix. Nodular pattern, not treated pt was sched for [facility] 09/12/19 and canceled notes in [employer], re bx on 03/11/22 bx proven BCC, Mohs 03/26/2022   Basal cell carcinoma 04/30/2020   Left cheek. Superficial and nodular patterns. excised   Coronary artery calcification seen on CT scan    Esophageal reflux    Essential hypertension    History of stress test    a. 01/2012 MV: EF 63%, no rwma. No ischemia/infarct. Low risk.   Hypokalemia 08/01/2022   Mixed hyperlipidemia    Osteitis deformans without mention of bone tumor    Paget's Disease   PAD (peripheral artery disease) (HCC)    a. 12/2012 and 05/2013 s/p bilat LE bypass @ [facility] in setting of bilat popliteal aneurysms; b. 05/2019 LE Duplex: R PT 50-74%, Nl L Leg.   Parkinson's disease    Persistent atrial fibrillation (HCC)    a. 02/2015 Event monitor: long periods of Afib--prev on flecainide; b. CHA2DS2VASc =  8-->Eliquis 5 bid.   Retinal detachment    Senile osteoporosis    Severe aortic stenosis    a. 05/2019 Echo: Mod AS. Mean grad 29.5 mmHg. Peak grad 55.7 mmHg. AVA 1.28 cm^2 (VTI); b. 08/2020 Echo: EF 65-70%, mod LVH, GrI DD, nl RV fxn, triv Timothy, mild-mod AI. Sev AS (AVA 0.87cm^2, mean grad 40, AoV Vmax 4.73m/s). Mild dil of Ao root.   Skin cancer    Squamous cell carcinoma of skin 04/02/2017   Left cheek. SCCis. Referred for Moh's, clear at appt 06/18/17. Pt opted to treat with 5FU over Mohs.   Swelling of right lower extremity    a. 05/2019 LE U/S: No DVT. 50-64% R PT artery stenosis.   TIA (transient ischemic attack)    Type II diabetes mellitus (HCC)     Current Outpatient Medications  Medication Sig Dispense Refill   acetaminophen (TYLENOL) 325 MG tablet Take 2 tablets (650 mg total) by mouth every 6 (six) hours as needed for mild pain or headache  (fever >/= 101).     ALPRAZolam (XANAX) 0.25 MG tablet Take 1 tablet (0.25 mg total) by mouth 2 (two) times daily as needed for anxiety.     amiodarone (PACERONE) 200 MG tablet Take 1 tablet (200 mg total) by mouth 2 (two) times daily.     apixaban (ELIQUIS) 5 MG TABS tablet Take 1 tablet (5 mg total) by mouth 2 (two) times daily.     Calcium Carb-Cholecalciferol (CALCIUM PLUS VITAMIN D) 500-5 MG-MCG TABS Take 1 tablet by mouth daily.     docusate sodium (COLACE) 100 MG capsule Take 100 mg by mouth daily as needed for mild constipation.     famotidine (PEPCID) 20 MG tablet Take 20 mg by mouth at bedtime.     FARXIGA 10 MG TABS tablet TAKE 1 TABLET BY MOUTH DAILY 60 tablet 0   furosemide (LASIX) 10 MG/ML injection Inject 2 mLs (20 mg total) into the vein daily.     insulin aspart (NOVOLOG) 100 UNIT/ML injection Inject 0-5 Units into the skin at bedtime.     insulin aspart (NOVOLOG) 100 UNIT/ML injection Inject 0-6 Units into the skin 3 (three) times daily with meals.     rosuvastatin (CRESTOR) 10 MG tablet Take 1 tablet (10 mg total) by mouth daily. 90 tablet 3   sertraline (ZOLOFT) 100 MG tablet Take 1 tablet (100 mg total) by mouth daily.     No current facility-administered medications for this visit.    No Known Allergies    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  Tobacco Use   Smoking status: Never   Smokeless tobacco: Never  Vaping Use   Vaping status: Never Used  Substance and Sexual Activity   Alcohol use: No   Drug use: No   Sexual activity: Not Currently  Other Topics Concern   Not on file  Social History Narrative   Not on file   Social Determinants of Health   Financial Resource Strain: Not on file  Food Insecurity: No Food Insecurity (07/30/2022)    Hunger Vital Sign    Worried About Running Out of Food in the Last Year: Never true    Ran Out of Food in the Last Year: Never true  Transportation Needs: No Transportation Needs (02/10/2023)   Received from [facility] - [facility]    In the past 12 months, has lack of transportation kept you from medical appointments or from getting medications?: No    Lack of Transportation (Non-Medical): No  Physical Activity: Not on file  Stress: Not on file  Social Connections: Not on file  Intimate Partner Violence: Not At Risk (07/30/2022)   Humiliation, Afraid, Rape, and Kick questionnaire    Fear of Current or Ex-Partner: No    Emotionally Abused: No    Physically Abused: No    Sexually Abused: No      Family History  Problem Relation Age of Onset   Heart disease Mother    Diabetes Father    Retinal detachment Brother    Heart disease Brother    Stroke Brother    Diabetes Other        grandparents    There were no vitals filed for this visit.   PHYSICAL EXAM: General:  Well appearing. No respiratory difficulty HEENT: normal Neck: supple. no JVD. Carotids 2+ bilat; no bruits. No lymphadenopathy or thyromegaly appreciated. Cor: PMI nondisplaced. Regular rate & rhythm. No rubs, gallops or murmurs. Lungs: clear Abdomen: soft, nontender, nondistended. No hepatosplenomegaly. No bruits or masses. Good bowel sounds. Extremities: no cyanosis, clubbing, rash, edema Neuro: alert & oriented x 3, cranial nerves grossly intact. moves all 4 extremities w/o difficulty. Affect pleasant.  ECG:   ASSESSMENT & PLAN:     Delma Freeze, FNP 02/12/23

## 2023-03-08 DEATH — deceased

## 2023-03-31 ENCOUNTER — Ambulatory Visit: Payer: Medicare Other | Admitting: Dermatology
